# Patient Record
Sex: Female | Born: 1945 | Race: White | Hispanic: No | Marital: Married | Smoking: Former smoker
Health system: Southern US, Community
[De-identification: ages and names within clinical notes are randomized; demographics above are authoritative.]

## PROBLEM LIST (undated history)

## (undated) DIAGNOSIS — C50919 Malignant neoplasm of unspecified site of unspecified female breast: Secondary | ICD-10-CM

## (undated) DIAGNOSIS — E785 Hyperlipidemia, unspecified: Secondary | ICD-10-CM

## (undated) DIAGNOSIS — G4730 Sleep apnea, unspecified: Secondary | ICD-10-CM

## (undated) DIAGNOSIS — G5600 Carpal tunnel syndrome, unspecified upper limb: Secondary | ICD-10-CM

## (undated) DIAGNOSIS — E119 Type 2 diabetes mellitus without complications: Secondary | ICD-10-CM

## (undated) DIAGNOSIS — F32A Depression, unspecified: Secondary | ICD-10-CM

## (undated) DIAGNOSIS — F329 Major depressive disorder, single episode, unspecified: Secondary | ICD-10-CM

## (undated) DIAGNOSIS — J45909 Unspecified asthma, uncomplicated: Secondary | ICD-10-CM

## (undated) DIAGNOSIS — L409 Psoriasis, unspecified: Secondary | ICD-10-CM

## (undated) DIAGNOSIS — M810 Age-related osteoporosis without current pathological fracture: Secondary | ICD-10-CM

## (undated) DIAGNOSIS — E039 Hypothyroidism, unspecified: Secondary | ICD-10-CM

## (undated) DIAGNOSIS — I10 Essential (primary) hypertension: Secondary | ICD-10-CM

## (undated) DIAGNOSIS — E559 Vitamin D deficiency, unspecified: Secondary | ICD-10-CM

## (undated) DIAGNOSIS — J449 Chronic obstructive pulmonary disease, unspecified: Secondary | ICD-10-CM

## (undated) HISTORY — DX: Psoriasis, unspecified: L40.9

## (undated) HISTORY — DX: Age-related osteoporosis without current pathological fracture: M81.0

## (undated) HISTORY — DX: Chronic obstructive pulmonary disease, unspecified: J44.9

## (undated) HISTORY — DX: Hyperlipidemia, unspecified: E78.5

## (undated) HISTORY — PX: OTHER SURGICAL HISTORY: SHX169

## (undated) HISTORY — DX: Type 2 diabetes mellitus without complications: E11.9

## (undated) HISTORY — DX: Vitamin D deficiency, unspecified: E55.9

## (undated) HISTORY — DX: Sleep apnea, unspecified: G47.30

## (undated) HISTORY — DX: Carpal tunnel syndrome, unspecified upper limb: G56.00

## (undated) HISTORY — DX: Hypothyroidism, unspecified: E03.9

## (undated) HISTORY — DX: Essential (primary) hypertension: I10

## (undated) HISTORY — DX: Major depressive disorder, single episode, unspecified: F32.9

## (undated) HISTORY — DX: Depression, unspecified: F32.A

## (undated) HISTORY — PX: TUBAL LIGATION: SHX77

---

## 1993-11-13 HISTORY — PX: OTHER SURGICAL HISTORY: SHX169

## 1998-11-13 HISTORY — PX: THUMB ARTHROSCOPY: SHX2509

## 1999-10-25 ENCOUNTER — Ambulatory Visit (HOSPITAL_BASED_OUTPATIENT_CLINIC_OR_DEPARTMENT_OTHER): Admission: RE | Admit: 1999-10-25 | Discharge: 1999-10-25 | Payer: Self-pay | Admitting: Orthopedic Surgery

## 2000-10-01 ENCOUNTER — Other Ambulatory Visit: Admission: RE | Admit: 2000-10-01 | Discharge: 2000-10-01 | Payer: Self-pay | Admitting: Family Medicine

## 2003-01-19 ENCOUNTER — Other Ambulatory Visit: Admission: RE | Admit: 2003-01-19 | Discharge: 2003-01-19 | Payer: Self-pay | Admitting: Family Medicine

## 2004-01-21 ENCOUNTER — Other Ambulatory Visit: Admission: RE | Admit: 2004-01-21 | Discharge: 2004-01-21 | Payer: Self-pay | Admitting: Family Medicine

## 2006-01-03 ENCOUNTER — Ambulatory Visit: Payer: Self-pay | Admitting: Occupational Therapy

## 2006-04-02 ENCOUNTER — Encounter: Payer: Self-pay | Admitting: Nurse Practitioner

## 2006-04-12 ENCOUNTER — Ambulatory Visit: Payer: Self-pay | Admitting: Nurse Practitioner

## 2007-02-05 ENCOUNTER — Ambulatory Visit: Payer: Self-pay | Admitting: Nurse Practitioner

## 2007-02-26 ENCOUNTER — Ambulatory Visit: Payer: Self-pay | Admitting: Nurse Practitioner

## 2007-09-19 ENCOUNTER — Emergency Department: Payer: Self-pay | Admitting: Emergency Medicine

## 2008-07-21 ENCOUNTER — Ambulatory Visit: Payer: Self-pay | Admitting: Nurse Practitioner

## 2009-12-03 ENCOUNTER — Inpatient Hospital Stay: Payer: Self-pay | Admitting: Specialist

## 2009-12-15 ENCOUNTER — Inpatient Hospital Stay: Payer: Self-pay | Admitting: Specialist

## 2010-02-14 ENCOUNTER — Ambulatory Visit: Payer: Self-pay | Admitting: Internal Medicine

## 2010-03-01 ENCOUNTER — Ambulatory Visit: Payer: Self-pay | Admitting: Internal Medicine

## 2010-05-29 ENCOUNTER — Emergency Department: Payer: Self-pay | Admitting: Emergency Medicine

## 2010-07-12 ENCOUNTER — Ambulatory Visit: Payer: Self-pay | Admitting: Specialist

## 2011-04-12 ENCOUNTER — Ambulatory Visit: Payer: Self-pay | Admitting: Nurse Practitioner

## 2011-08-08 ENCOUNTER — Ambulatory Visit: Payer: Self-pay | Admitting: Gastroenterology

## 2011-08-10 LAB — PATHOLOGY REPORT

## 2012-04-19 ENCOUNTER — Ambulatory Visit: Payer: Self-pay | Admitting: Anesthesiology

## 2012-05-09 ENCOUNTER — Ambulatory Visit: Payer: Self-pay | Admitting: Anesthesiology

## 2012-05-28 ENCOUNTER — Ambulatory Visit: Payer: Self-pay | Admitting: Anesthesiology

## 2012-06-24 ENCOUNTER — Ambulatory Visit: Payer: Self-pay | Admitting: Anesthesiology

## 2012-07-23 ENCOUNTER — Ambulatory Visit: Payer: Self-pay | Admitting: Anesthesiology

## 2012-08-20 ENCOUNTER — Ambulatory Visit: Payer: Self-pay | Admitting: Anesthesiology

## 2012-09-19 ENCOUNTER — Ambulatory Visit: Payer: Self-pay | Admitting: Anesthesiology

## 2012-09-30 ENCOUNTER — Ambulatory Visit: Payer: Self-pay | Admitting: Anesthesiology

## 2012-11-25 LAB — COMPREHENSIVE METABOLIC PANEL
Albumin: 3.8 g/dL (ref 3.4–5.0)
BUN: 10 mg/dL (ref 7–18)
Bilirubin,Total: 0.3 mg/dL (ref 0.2–1.0)
Chloride: 101 mmol/L (ref 98–107)
Creatinine: 0.55 mg/dL — ABNORMAL LOW (ref 0.60–1.30)
EGFR (African American): >60
EGFR (Non-African Amer.): >60
Glucose: 131 mg/dL — ABNORMAL HIGH (ref 65–99)
Osmolality: 277 (ref 275–301)
Potassium: 3.8 mmol/L (ref 3.5–5.1)
SGOT(AST): 50 U/L — ABNORMAL HIGH (ref 15–37)
SGPT (ALT): 63 U/L (ref 12–78)
Total Protein: 7.9 g/dL (ref 6.4–8.2)

## 2012-11-25 LAB — TROPONIN I: Troponin-I: <0.02 ng/mL

## 2012-11-25 LAB — CBC
HCT: 41.6 % (ref 35.0–47.0)
HGB: 12.4 g/dL (ref 12.0–16.0)
MCH: 26.7 pg (ref 26.0–34.0)
MCHC: 29.9 g/dL — ABNORMAL LOW (ref 32.0–36.0)
MCV: 89 fL (ref 80–100)
Platelet: 328 10*3/uL (ref 150–440)
RBC: 4.66 10*6/uL (ref 3.80–5.20)
RDW: 15.7 % — ABNORMAL HIGH (ref 11.5–14.5)
WBC: 13.7 10*3/uL — ABNORMAL HIGH (ref 3.6–11.0)

## 2012-11-26 ENCOUNTER — Inpatient Hospital Stay: Payer: Self-pay | Admitting: Internal Medicine

## 2012-11-29 LAB — PLATELET COUNT: Platelet: 366 x10 3/mm 3

## 2012-11-30 LAB — CBC WITH DIFFERENTIAL/PLATELET
Basophil #: 0 10*3/uL (ref 0.0–0.1)
Basophil %: 0.1 %
Eosinophil #: 0.1 10*3/uL (ref 0.0–0.7)
HCT: 45.4 % (ref 35.0–47.0)
HGB: 14.9 g/dL (ref 12.0–16.0)
Lymphocyte #: 4.4 10*3/uL — ABNORMAL HIGH (ref 1.0–3.6)
MCH: 29.4 pg (ref 26.0–34.0)
MCHC: 32.7 g/dL (ref 32.0–36.0)
MCV: 90 fL (ref 80–100)
Monocyte %: 6.8 %
Neutrophil %: 67.8 %
Platelet: 389 10*3/uL (ref 150–440)

## 2012-11-30 LAB — BASIC METABOLIC PANEL
Anion Gap: 4 — ABNORMAL LOW (ref 7–16)
BUN: 23 mg/dL — ABNORMAL HIGH (ref 7–18)
Calcium, Total: 9.7 mg/dL (ref 8.5–10.1)
Chloride: 92 mmol/L — ABNORMAL LOW (ref 98–107)
Co2: 38 mmol/L — ABNORMAL HIGH (ref 21–32)
Creatinine: 0.66 mg/dL (ref 0.60–1.30)
EGFR (African American): >60
EGFR (Non-African Amer.): >60
Glucose: 91 mg/dL (ref 65–99)
Osmolality: 272 (ref 275–301)
Potassium: 4.7 mmol/L (ref 3.5–5.1)

## 2012-12-06 ENCOUNTER — Emergency Department: Payer: Self-pay | Admitting: Emergency Medicine

## 2012-12-06 LAB — BASIC METABOLIC PANEL
Co2: 34 mmol/L — ABNORMAL HIGH (ref 21–32)
Creatinine: 0.85 mg/dL (ref 0.60–1.30)
EGFR (African American): >60
EGFR (Non-African Amer.): >60
Potassium: 4.1 mmol/L (ref 3.5–5.1)

## 2012-12-06 LAB — CBC
HCT: 42.4 % (ref 35.0–47.0)
MCH: 29.1 pg (ref 26.0–34.0)
Platelet: 277 10*3/uL (ref 150–440)
RDW: 15.6 % — ABNORMAL HIGH (ref 11.5–14.5)

## 2012-12-06 LAB — URINALYSIS, COMPLETE
Bilirubin,UR: NEGATIVE
Blood: NEGATIVE
Ketone: NEGATIVE
Leukocyte Esterase: NEGATIVE
Nitrite: NEGATIVE
Ph: 5 (ref 4.5–8.0)
Protein: NEGATIVE
RBC,UR: 1 /HPF (ref 0–5)
Specific Gravity: 1.024 (ref 1.003–1.030)
Squamous Epithelial: 2
WBC UR: 1 /HPF (ref 0–5)

## 2013-06-02 ENCOUNTER — Inpatient Hospital Stay: Payer: Self-pay | Admitting: Surgery

## 2013-06-02 LAB — URINALYSIS, COMPLETE
Bacteria: NONE SEEN
Bilirubin,UR: NEGATIVE
Blood: NEGATIVE
Leukocyte Esterase: NEGATIVE
Ph: 6 (ref 4.5–8.0)
Protein: NEGATIVE
RBC,UR: 1 /HPF (ref 0–5)
Specific Gravity: 1.025 (ref 1.003–1.030)
WBC UR: 1 /HPF (ref 0–5)

## 2013-06-02 LAB — COMPREHENSIVE METABOLIC PANEL
Anion Gap: 3 — ABNORMAL LOW (ref 7–16)
BUN: 11 mg/dL (ref 7–18)
Bilirubin,Total: 0.4 mg/dL (ref 0.2–1.0)
Calcium, Total: 10.3 mg/dL — ABNORMAL HIGH (ref 8.5–10.1)
Creatinine: 0.77 mg/dL (ref 0.60–1.30)
EGFR (African American): >60
Osmolality: 284 (ref 275–301)
Potassium: 4.1 mmol/L (ref 3.5–5.1)
SGOT(AST): 38 U/L — ABNORMAL HIGH (ref 15–37)
SGPT (ALT): 49 U/L (ref 12–78)
Sodium: 137 mmol/L (ref 136–145)
Total Protein: 7.8 g/dL (ref 6.4–8.2)

## 2013-06-02 LAB — CBC
MCHC: 32.6 g/dL (ref 32.0–36.0)
MCV: 90 fL (ref 80–100)
RBC: 4.53 10*6/uL (ref 3.80–5.20)
RDW: 15.1 % — ABNORMAL HIGH (ref 11.5–14.5)
WBC: 13.8 10*3/uL — ABNORMAL HIGH (ref 3.6–11.0)

## 2013-06-02 LAB — LIPASE, BLOOD: Lipase: 141 U/L (ref 73–393)

## 2013-06-03 LAB — COMPREHENSIVE METABOLIC PANEL
Alkaline Phosphatase: 60 U/L (ref 50–136)
Anion Gap: 3 — ABNORMAL LOW (ref 7–16)
Bilirubin,Total: 0.3 mg/dL (ref 0.2–1.0)
EGFR (African American): >60
EGFR (Non-African Amer.): >60
Glucose: 196 mg/dL — ABNORMAL HIGH (ref 65–99)
Potassium: 3.5 mmol/L (ref 3.5–5.1)
SGOT(AST): 69 U/L — ABNORMAL HIGH (ref 15–37)
Sodium: 137 mmol/L (ref 136–145)
Total Protein: 6.9 g/dL (ref 6.4–8.2)

## 2013-06-03 LAB — MAGNESIUM: Magnesium: 1.4 mg/dL — ABNORMAL LOW

## 2013-06-03 LAB — FIBRINOGEN: Fibrinogen: 556 mg/dL — ABNORMAL HIGH (ref 210–470)

## 2013-06-03 LAB — CBC WITH DIFFERENTIAL/PLATELET
Basophil #: 0.1 10*3/uL (ref 0.0–0.1)
Eosinophil #: 0.2 10*3/uL (ref 0.0–0.7)
HCT: 36.9 % (ref 35.0–47.0)
HGB: 12 g/dL (ref 12.0–16.0)
Lymphocyte #: 2.5 10*3/uL (ref 1.0–3.6)
Lymphocyte %: 19.3 %
MCH: 29.3 pg (ref 26.0–34.0)
MCHC: 32.5 g/dL (ref 32.0–36.0)
MCV: 90 fL (ref 80–100)
Monocyte #: 0.9 x10 3/mm (ref 0.2–0.9)
Monocyte %: 7 %
Neutrophil #: 9.4 10*3/uL — ABNORMAL HIGH (ref 1.4–6.5)
Platelet: 268 10*3/uL (ref 150–440)
RBC: 4.09 10*6/uL (ref 3.80–5.20)
WBC: 13.2 10*3/uL — ABNORMAL HIGH (ref 3.6–11.0)

## 2013-06-03 LAB — PROTIME-INR: Prothrombin Time: 14.1 secs (ref 11.5–14.7)

## 2013-06-03 LAB — APTT: Activated PTT: 30 secs (ref 23.6–35.9)

## 2013-06-04 LAB — CBC WITH DIFFERENTIAL/PLATELET
Basophil #: 0 10*3/uL (ref 0.0–0.1)
Basophil %: 0.4 %
HCT: 35.6 % (ref 35.0–47.0)
HGB: 11.6 g/dL — ABNORMAL LOW (ref 12.0–16.0)
Lymphocyte #: 0.8 10*3/uL — ABNORMAL LOW (ref 1.0–3.6)
Lymphocyte %: 6.8 %
MCH: 29 pg (ref 26.0–34.0)
MCV: 90 fL (ref 80–100)
Monocyte %: 5.3 %
Neutrophil #: 10.6 10*3/uL — ABNORMAL HIGH (ref 1.4–6.5)
Neutrophil %: 86.2 %
Platelet: 242 10*3/uL (ref 150–440)
RBC: 3.98 10*6/uL (ref 3.80–5.20)
RDW: 14.7 % — ABNORMAL HIGH (ref 11.5–14.5)

## 2013-06-04 LAB — BASIC METABOLIC PANEL
Anion Gap: 5 — ABNORMAL LOW (ref 7–16)
Co2: 31 mmol/L (ref 21–32)
Creatinine: 0.66 mg/dL (ref 0.60–1.30)
EGFR (African American): >60
EGFR (Non-African Amer.): >60
Osmolality: 272 (ref 275–301)

## 2013-06-04 LAB — HEMOGLOBIN A1C: Hemoglobin A1C: 10.3 % — ABNORMAL HIGH (ref 4.2–6.3)

## 2013-06-05 LAB — CBC WITH DIFFERENTIAL/PLATELET
Basophil #: 0 10*3/uL (ref 0.0–0.1)
Basophil %: 0.2 %
Eosinophil #: 0.2 10*3/uL (ref 0.0–0.7)
HCT: 38 % (ref 35.0–47.0)
HGB: 12.8 g/dL (ref 12.0–16.0)
Lymphocyte #: 1.2 10*3/uL (ref 1.0–3.6)
Lymphocyte %: 8.9 %
MCH: 30 pg (ref 26.0–34.0)
MCHC: 33.6 g/dL (ref 32.0–36.0)
MCV: 89 fL (ref 80–100)
Neutrophil #: 11.3 10*3/uL — ABNORMAL HIGH (ref 1.4–6.5)
Neutrophil %: 82.8 %
RBC: 4.25 10*6/uL (ref 3.80–5.20)
RDW: 14.8 % — ABNORMAL HIGH (ref 11.5–14.5)
WBC: 13.6 10*3/uL — ABNORMAL HIGH (ref 3.6–11.0)

## 2013-09-25 ENCOUNTER — Ambulatory Visit: Payer: Self-pay | Admitting: Specialist

## 2014-04-01 ENCOUNTER — Ambulatory Visit: Payer: Self-pay | Admitting: Specialist

## 2014-06-25 DIAGNOSIS — J310 Chronic rhinitis: Secondary | ICD-10-CM | POA: Insufficient documentation

## 2014-06-25 DIAGNOSIS — J449 Chronic obstructive pulmonary disease, unspecified: Secondary | ICD-10-CM | POA: Insufficient documentation

## 2014-06-25 DIAGNOSIS — G4730 Sleep apnea, unspecified: Secondary | ICD-10-CM | POA: Insufficient documentation

## 2014-07-08 ENCOUNTER — Ambulatory Visit: Payer: Self-pay | Admitting: Specialist

## 2014-11-19 ENCOUNTER — Observation Stay: Payer: Self-pay | Admitting: Internal Medicine

## 2014-11-19 LAB — CBC
HCT: 47.5 % — ABNORMAL HIGH (ref 35.0–47.0)
HGB: 15 g/dL (ref 12.0–16.0)
MCH: 29.9 pg (ref 26.0–34.0)
MCHC: 31.5 g/dL — ABNORMAL LOW (ref 32.0–36.0)
MCV: 95 fL (ref 80–100)
Platelet: 297 10*3/uL (ref 150–440)
RBC: 5.01 10*6/uL (ref 3.80–5.20)
RDW: 14.1 % (ref 11.5–14.5)
WBC: 9.9 10*3/uL (ref 3.6–11.0)

## 2014-11-19 LAB — PRO B NATRIURETIC PEPTIDE: B-Type Natriuretic Peptide: 106 pg/mL (ref 0–125)

## 2014-11-19 LAB — BASIC METABOLIC PANEL
Anion Gap: 5 — ABNORMAL LOW (ref 7–16)
BUN: 7 mg/dL (ref 7–18)
CO2: 35 mmol/L — AB (ref 21–32)
CREATININE: 0.55 mg/dL — AB (ref 0.60–1.30)
Calcium, Total: 9 mg/dL (ref 8.5–10.1)
Chloride: 98 mmol/L (ref 98–107)
EGFR (African American): >60
Glucose: 226 mg/dL — ABNORMAL HIGH (ref 65–99)
OSMOLALITY: 281 (ref 275–301)
Potassium: 3.9 mmol/L (ref 3.5–5.1)
Sodium: 138 mmol/L (ref 136–145)

## 2014-11-19 LAB — TROPONIN I: Troponin-I: <0.02 ng/mL

## 2014-11-19 LAB — D-DIMER(ARMC): D-DIMER: 446 ng/mL

## 2014-11-20 LAB — CBC WITH DIFFERENTIAL/PLATELET
BASOS PCT: 0.1 %
Basophil #: 0 10*3/uL (ref 0.0–0.1)
Eosinophil #: 0 10*3/uL (ref 0.0–0.7)
Eosinophil %: 0 %
HCT: 45.2 % (ref 35.0–47.0)
HGB: 14.3 g/dL (ref 12.0–16.0)
LYMPHS ABS: 0.9 10*3/uL — AB (ref 1.0–3.6)
LYMPHS PCT: 8.4 %
MCH: 30 pg (ref 26.0–34.0)
MCHC: 31.8 g/dL — ABNORMAL LOW (ref 32.0–36.0)
MCV: 94 fL (ref 80–100)
Monocyte #: 0.2 x10 3/mm (ref 0.2–0.9)
Monocyte %: 1.6 %
NEUTROS ABS: 9.5 10*3/uL — AB (ref 1.4–6.5)
Neutrophil %: 89.9 %
Platelet: 297 10*3/uL (ref 150–440)
RBC: 4.79 10*6/uL (ref 3.80–5.20)
RDW: 13.9 % (ref 11.5–14.5)
WBC: 10.6 10*3/uL (ref 3.6–11.0)

## 2014-11-20 LAB — BASIC METABOLIC PANEL
Anion Gap: 6 — ABNORMAL LOW (ref 7–16)
BUN: 9 mg/dL (ref 7–18)
Calcium, Total: 9.1 mg/dL (ref 8.5–10.1)
Chloride: 97 mmol/L — ABNORMAL LOW (ref 98–107)
Co2: 32 mmol/L (ref 21–32)
Creatinine: 0.76 mg/dL (ref 0.60–1.30)
EGFR (African American): >60
EGFR (Non-African Amer.): >60
Glucose: 354 mg/dL — ABNORMAL HIGH (ref 65–99)
Osmolality: 283 (ref 275–301)
POTASSIUM: 4.4 mmol/L (ref 3.5–5.1)
SODIUM: 135 mmol/L — AB (ref 136–145)

## 2014-11-24 LAB — CULTURE, BLOOD (SINGLE)

## 2015-02-26 ENCOUNTER — Other Ambulatory Visit: Payer: Self-pay | Admitting: Specialist

## 2015-02-26 DIAGNOSIS — R911 Solitary pulmonary nodule: Secondary | ICD-10-CM

## 2015-03-05 NOTE — Consult Note (Signed)
PATIENT NAME:  Ebony [name], GIBBY MR#:  [id number] DATE OF BIRTH:  [birth year]  DATE OF CONSULTATION:  06/03/2013  REFERRING PHYSICIAN:  Molly Maduro, MD CONSULTING PHYSICIAN:  Briar Sink, MD  PRIMARY CARE PHYSICIAN: Erma Pinto, FNP-C  REASON FOR CONSULTATION:  Medical management of multiple comorbidities.  The patient is admitted with diverticulitis but also has uncontrolled diabetes, uncontrolled hypertension, COPD and other comorbidities.   HISTORY OF PRESENT ILLNESS: Ebony [name] is a very nice 69 year old female who has a history of diabetes, hypertension and COPD with chronic respiratory failure for what she uses 2 to 2.5 liters of oxygen at home. She also has hypothyroidism and hyperlipidemia. She is morbidly obese. She has psoriasis, osteoporosis and a fatty liver.  The patient comes with a history of abdominal pain located on the left lower quadrant that appeared the day before admission. The patient was admitted on 06/02/2013. The pain was mostly in the left lower quadrant pain with intensity of 6 or 7 out of 10, lasting for 2 days continuous, exacerbated with movement, improved with resting. It is not associated with nausea and vomiting. The patient has not had any diarrhea. The patient has been constipated for 3 to 4 days. The patient was evaluated by Dr. Leanora Cover who decided to admit her to the hospital after CT scan was done showing diverticulitis of the sigmoid colon. There was a recommendation to follow up with a colonoscopy due to similar appearance of cancer, although clinically the patient has diverticulitis. The patient was put on clindamycin and Rocephin. Her white count was elevated in the 1300s and she overall has been afebrile and she has not been tachycardic. She has chronic respiratory failure for what she uses 2 to 2.5 liters by nasal cannula. She has not had any exacerbations of this condition. The patient is having elevated blood sugars with low blood  pressures, which I was asking to help manage.   REVIEW OF SYSTEMS:  A 12 system review is done.  GENERAL: Negative. The patient denies any significant weight loss, weight gain, significant pain or difficulty getting around.  EYES:  Denies any blurry vision, double vision or eye swelling.  MOUTH:  No oral lesions. No odynophagia or dysphagia.  CARDIOVASCULAR: No chest pain. No orthopnea.  No palpitations. Never had a heart attack. No CHF.  RESPIRATORY: The patient has chronic respiratory failure, uses 2 to 2.5 liters of oxygen at home. She has not had any exacerbations. Denies any significant cough or increase in shortness of breath.  GASTROINTESTINAL: Positive for abdominal pain and constipation. No nausea or vomiting. No diarrhea. No hematemesis or melena.  GENITOURINARY: No significant dysuria, hematuria or changes in frequency.  GYNECOLOGIC: No breast masses. The patient has a history of BTL.  ENDOCRINE: No polyuria, polydipsia, polyphagia, cold or heat intolerance.  SKIN: Without any rashes, petechiae or new moles.  NEUROLOGIC: The patient denies any significant headaches, numbness, tingling, CVAs or TIAs. MUSCULOSKELETAL: No significant joint effusion or joint aches. The patient has mild knee and hip pain due to arthritis.  HEMATOLOGIC AND LYMPHATIC: No easy bruising or bleeding. No swollen glands.  PSYCHIATRIC: No significant anxiety or depression.  PAST MEDICAL HISTORY: 1.  Hypertension.  2.  Insulin-dependent diabetes.  3.  Chronic respiratory failure, on oxygen 2 liters at home.  4.  Severe chronic obstructive pulmonary disease.  5.  Hypothyroidism.  6.  Hyperlipidemia.  7.  Morbid obesity.  8.  Dyslipidemia.  9.  Depression and anxiety.  10.  Psoriasis.  11.  Osteoporosis.  12.  Fatty liver.   ALLERGIES: SHELLFISH.   PAST SURGICAL HISTORY:  Positive for BTL and carpal tunnel release syndrome.   FAMILY HISTORY: Positive for diabetes in her mom. Her mom also had CHF and  her brother had coronary artery disease. No cancer in the family.   SOCIAL HISTORY: The patient states that she smokes a little bit less than half pack a day. She quit before and she is going to try to quit again.  We had a long discussion about the benefits of quitting smoking and the patient states that she is going to give it her best try. Smoking cessation education has been given to the patient for at least 4 minutes. The patient does not drink. She is married.   CURRENT MEDICATIONS: 1.  Tylenol p.r.n. pain. 2.  Albuterol inhaler 2 puffs q. 4 hours as needed for shortness of breath. 3.  Aspirin 81 mg once a day. 4.  Symbicort 160/4.5 two puffs twice daily. 5.  Wellbutrin XL 150 mg daily. 6.  Ceftriaxone 1 gram every 24 hours. 7.  Citalopram 40 mg once daily. 8.  Diphenhydramine 25 mg every 6 hours as needed. 9.  Ergocalciferol 50,000 units once a week. 10.  Pepcid 20 mg twice daily. 11.  Furosemide 20 mg twice daily. 12.  Hydrochlorothiazide with losartan 12.5/50 mg take 2 tablets once a day. 13.  Insulin sliding scale.  14.  Lactulose 30 mg twice daily. 15.  Levothyroxine 0.25 mg once a day. 16.  Lidocaine ointment. 17.  Morphine 1 to 3 mg every 2 hours as needed for pain.  18.  Omega-3 fatty acids. 19.  Lovaza 1 gram once daily. 20.  Compazine suppository as needed for nausea. 21.  Simvastatin 40 mg at bedtime.  22.  Spiriva 1 capsule inhaled daily. 23.  Vitamin A 400 mg daily. 24.  Ambien 5 mg as needed for insomnia. 25.  Insulin detemir; the patient takes 70 units twice daily and she takes 32 units of NovoLog with breakfast, 32 at lunch and 38 at dinner time.  26.  Magnesium sulfate started today 2 grams x 1.   PHYSICAL EXAMINATION: VITAL SIGNS: Blood pressure 100/61, pulse 63, respirations 17, temperature 98.1, oxygen saturation 95% on 2 liters nasal cannula. GENERAL: The patient is alert and oriented x 3. No acute distress. No respiratory distress. Hemodynamically  stable. HEENT:  Pupils are equal and reactive. Extraocular movements are intact. Mucosa is moist. Anicteric sclerae. Pink conjunctivae. No oral lesions. No oropharyngeal exudates.  NECK: Supple. No JVD. No thyromegaly. No adenopathy. No carotid bruits. No rigidity.  HEART:  Regular rate and rhythm. No murmurs, rubs or gallops are appreciated. No tenderness to palpation on anterior chest wall. No displacement of PMI.  LUNGS: Clear. No wheezing or crepitus. There is significant decrease of respiratory sounds in both bases which seems to be chronic due to her body habitus. No use of accessory muscles.  ABDOMEN: Soft, nondistended. Bowel sounds are positive. Tender to palpation to the left lower quadrant. No rebound pain. No guarding.  GENITAL:  Negative for external lesions.  EXTREMITIES: No significant edema, cyanosis or clubbing. Pulses +2. Capillary refill less than 3.   MUSCULOSKELETAL: No significant joint effusions or erythema of joints.  No deformity.  SKIN: No rashes or petechiae.  NEUROLOGIC: Cranial nerves II through XII intact. Strength is 5/5 in all 4 extremities.  PSYCHIATRIC:  Negative for agitation. The patient is alert and oriented x 3 and  very pleasant.  LYMPHATIC: Negative for lymphadenopathy in the neck or supraclavicular areas.  VASCULAR: Pulses +2. Capillary refill less than 3.  NEUROLOGIC: Cranial nerves II through XII intact. Strength is 5/5 in all 4 extremities.   LABORATORY AND DIAGNOSTICS: Glucose 287, now 196. Creatinine 0.69. CO2 is 34. The patient is a chronic retainer. Magnesium is 1.4. Amylase is 20. AST 69. White blood count 13.2, hemoglobin 12 and platelet count 268.  Elevated fibrinogen at 556 with normal INR and prothrombin.  Urinalysis shows 1 white blood cell. No nitrites or leukocyte esterase.   CT scan:  As mentioned above.   ASSESSMENT AND PLAN:   1.  This is a 69 year old female admitted by Dr. Leanora Cover for treatment of diverticulitis. I was asked to help  with medical management.  2.  The patient has diverticulitis. Continue antibiotics for now. Pain control.  Nausea control. Manage blood sugars, blood pressures and any lung problems.  3.  The patient has been treated with antibiotics, clindamycin and Rocephin. This is not a common choice of antibiotics for this condition. Would prefer to simplify the treatment. Usually ceftriaxone and clindamycin together have potential to create a severe Clostridium difficile infection for what I would rather put the patient on ciprofloxacin and metronidazole if possible. 4.  I am going to wait until I talk to Dr. Rexene Edison who is covering for Dr. Leanora Cover about this issue.  5.  The patient is overall stable. Her white count is still the same as yesterday. She has not had any fever.  6.  As far as her diabetes goes, her hemoglobin A1c is above 9.  Rechecked just recently. We are going to ask to recheck a hemoglobin A1c again. We are going to start her back on some of her medications as she it is not necessarily eating all her regular diet. We are going to cut back the Levemir to half, instead of 70 units give 35, and we are also going to start her on insulin sliding scale. She takes NovoLog schedule, but we are going to hold on that.  7.  As far as her blood pressure, we are holding the Lasix and holding hydrochlorothiazide. Continue gentle hydration. Continue oral hydration.  8.  Hypomagnesemia. Replace. 9.  Obstructive sleep apnea. The patient is a chronic CO2 retainer. Her CO2 on the BNP is slightly elevated at 34. We are going to put her back on her CPAP with home settings.  10.  Deep vein thrombosis prophylaxis. Heparin.  11.  Gastrointestinal prophylaxis with Pepcid.   I spent about 50 minutes with this consultation. Thank you, Dr. Leanora Cover, for allowing me to participate in the care of your patient. Please call me if you have any questions.  ____________________________ [place] Sink,  MD rsg:sb D: 06/03/2013 12:34:48 ET T: 06/03/2013 13:05:52 ET JOB#: [id number]  cc: [place] Sink, MD, <Dictator> Taviana Westergren America Brown MD ELECTRONICALLY SIGNED 06/08/2013 0:33

## 2015-03-05 NOTE — Discharge Summary (Signed)
PATIENT NAME:  Ebony [name], ORREGO MR#:  [id number] DATE OF BIRTH:  [birth year]  DATE OF ADMISSION:  11/26/2012 DATE OF DISCHARGE:  12/01/2012  ADMITTING DIAGNOSIS:  Shortness of breath and cough.   DISCHARGE DIAGNOSES:   1.  Shortness of breath and cough due to severe acute on chronic obstructive pulmonary disease exacerbation as well as acute bronchitis. Was slow to improve and now improved.  2.  Diabetes with poor blood glucose control during hospitalization due to steroids, now blood glucose improved.  3.  Hypothyroidism.  4.  Hyperlipidemia.  5.  Depression/anxiety.  6.  Acute on chronic respiratory failure.  7.  Hypertension.  8.  Psoriasis.  9.  Carpal tunnel syndrome.  10.  Depression.  11.  Osteoporosis.  12.  Status post tubal ligation and surgeries for bone spurs.   CONSULTANTS:  [employer].   PERTINENT LABORATORY AND EVALUATIONS:  Chest x-ray showed no acute cardiopulmonary processes. No effusion. No consolidation. EKG was normal sinus rhythm without any ST-T wave changes, incomplete right bundle branch block. Blood glucose was 131. BNP was 79. BUN was 10, creatinine 0.5, sodium 138, potassium 3.8. LFTs were normal. Troponin was less than 0.02. CBC showed a WBC count 13,000, hemoglobin 12, platelet count 328. Influenza A and B were negative.   HOSPITAL COURSE:  Please refer to H and P done by the admitting physician. The patient is a 69 year old pleasant white female with COPD who is on home oxygen, diabetes, morbid obesity, who has not felt well before Christmas. She kept having wheezing, shortness of breath, coughing and did not improve at home. Therefore, she came to the hospital. Due to persistent symptoms, she was admitted for acute on chronic respiratory failure treated with nebulizers, steroids. She was slow to improve. She had to be continued on IV steroids and also placed on antibiotics. Finally, her cough and her symptoms have improved. She is currently stable for discharge.    DISCHARGE MEDICATIONS:  Metformin 500 mg 1 tab p.o. b.i.d., Celexa 40 mg daily, Lantus 60 units b.i.d., levothyroxine 200 mcg daily, NovoLog FlexPen 22 units subcutaneous once a day in the morning at breakfast, 22 units once a day at lunch and 38 units before supper, vitamin D2, 50,000 International Units 1 tab p.o. 2 times a week, albuterol MDI 2 puffs every 4 to 6 hours p.r.n. for wheezing, aspirin 81 mg 1 tab p.o. daily, Zocor 40 mg daily, Hyzaar 100/25 mg 1 tab p.o. daily, albuterol nebulizer 4 times per day as needed, Spiriva 18 mcg daily, Symbicort 2 puffs b.i.d., Derma-Smoothe scalp 0.01% topical oil apply to affected area b.i.d. as needed, Lasix 20 mg 1 tab p.o. b.i.d., Wellbutrin XL 150 mg daily, chlorpheniramine/hydrocodone 8/10 mg per 5 mL solution 5 mL q. 12 hours p.r.n. for cough, dextromethorphan and guaifenesin 5 mL q. 4 hours p.r.n., prednisone taper 60 mg decrease by 10 mg until complete, Levaquin 500 mg 1 tab p.o. daily for the next 4 days, home oxygen 2 liters nasal cannula.   DIET:  Low-sodium, low-fat, low-cholesterol, carbohydrate-controlled diet.   ACTIVITY:  As tolerated.   FOLLOWUP:  With primary care provider, Juliann Pulse L. Patterson, FNP-C, in 1 to 2 weeks.   TIME SPENT:  35 minutes.    ____________________________ Lafonda Mosses Posey Pronto, MD shp:si D: 12/01/2012 14:06:20 ET T: 12/01/2012 21:18:54 ET JOB#: [id number]  cc: Paislyn Domenico H. Posey Pronto, MD, <Dictator> Alric Seton MD ELECTRONICALLY SIGNED 12/04/2012 11:23

## 2015-03-05 NOTE — H&P (Signed)
PATIENT NAME:  Ebony [name], Ebony [name] MR#:  [id number] DATE OF BIRTH:  07-Dec-1945  DATE OF ADMISSION:  06/02/2013  HISTORY OF PRESENT ILLNESS: Ebony [name] is a 69 year old white female who began experiencing left lower quadrant abdominal pain yesterday. This has not been associated with nausea, vomiting, diarrhea or constipation. She has never experienced anything like this before. Typically, she has 1 to 2 bowel movements per day, but she has not had a bowel movement for 3 or 4 days. This is unusual for her. She also denies hematochezia and hematemesis and melena.   PAST MEDICAL HISTORY: 1.  Chronic obstructive pulmonary disease on chronic home oxygen therapy.  2.  History of severe COPD exacerbation January 2014.  3.  Diabetes mellitus.  4.  Hypothyroidism.  5.  Dyslipidemia.  6.  Morbid obesity.  7.  Depression/anxiety.  8.  Hypertension.  9.  Psoriasis.  10.  Osteoporosis.  11.  Fatty liver.  12.  Status post bilateral tubal ligation.   MEDICATIONS: Albuterol 3 mL inhaled b.i.d. p.r.n., albuterol CFC-free 90 mcg inhaler 2 puffs q. 4 to 6h. p.r.n, aspirin 81 mg daily, Celexa 40 mg daily, fish oil 1000 mg t.i.d., furosemide 20 mg b.i.d., Hyzaar 100/25 daily, Levemir 100 units subcutaneous as directed, levothyroxine 125 mcg, 2 in the morning, topical lidocaine to affected areas daily, metformin 500 mg, 2 b.i.d., NovoLog FlexPen as directed, Spiriva 18 mcg inhalation capsule daily, Symbicort 160 mcg/4.5 mcg 1 puff b.i.d., vitamin D2 50,000 units b.i.d., vitamin E 400 international units daily, Wellbutrin XL 150 mg Extended Release daily, Zocor 40 mg daily.   ALLERGIES: SHELLFISH.   REVIEW OF SYSTEMS: Negative for 10 systems except as mentioned in the history of present illness above. There is no acute change in the patient's respiratory status, and the remainder of the pertinent negatives are mentioned in the history of present illness.  SOCIAL HISTORY: The patient is married and unemployed. She  smoked 1/2 pack of cigarettes per day until 2011 when she quit. She does not drink any appreciable alcohol.   FAMILY HISTORY: Noncontributory.   PHYSICAL EXAMINATION: GENERAL: Reveals an elderly obese white female in no apparent distress. Height 5 feet 2 inches, weight 257 pounds, BMI 47.1.  VITAL SIGNS: 98.7, pulse 79, respirations 24, blood pressure 135/61, oxygen saturation 94% on 2.5 L/min nasal cannula at rest.  HEENT: Pupils are equally round and reactive to light. Extraocular movements are intact. Sclerae are anicteric. Oropharynx is clear. Mucous membranes are moist. Hearing intact to voice.  NECK: Supple with no tracheal deviation or jugular venous distention.  HEART: Regular rate and rhythm with no murmurs or rubs.  LUNGS: Clear to auscultation with normal respiratory effort.  ABDOMEN: Markedly obese with no tenderness on the right side, very mild tenderness on the left upper quadrant and more significant, but still mild, tenderness in the left lower quadrant. There is no rebound tenderness and no involuntary guarding.  EXTREMITIES: No edema, with normal capillary refill bilaterally.  NEUROLOGIC: Cranial nerves II through XII, motor and sensation grossly intact.  PSYCHIATRIC: Alert and oriented x 4. Appropriate affect.   OTHER STUDIES:  White blood cell count 13.8, hemoglobin 13, platelet count 295,000. Electrolytes normal. Glucose 287, calcium 10.3, lipase normal, hepatic profile normal.  CT scan of the abdomen and pelvis with contrast reveals diverticulosis of the sigmoid colon with mild bowel wall thickening and adjacent stranding of the sigmoid colon in the left lower quadrant. There are no extraluminal fluid collections and no extraluminal air.  ASSESSMENT: Mild acute sigmoid diverticulitis.   PLAN: Admit to the hospital for IV antibiotics, n.p.o. status initially with gradual advancement of diet to a low fiber diet. The patient was instructed that she will stay on a low fiber  diet for 6 weeks post hospitalization, and then convert to a high fiber diet to prevent future episodes.      I will consult the Internal Medicine service to manage her diabetes, COPD and all medical problems other than her antibiotics and diet.   ____________________________ Consuela Mimes, MD wfm:cb D: 06/02/2013 20:23:01 ET T: 06/02/2013 20:49:02 ET JOB#: [id number]  cc: Consuela Mimes, MD, <Dictator> Eather Colas. Sharlett Iles, FNP-C Consuela Mimes MD ELECTRONICALLY SIGNED 06/03/2013 20:22

## 2015-03-05 NOTE — Consult Note (Signed)
Brief Consult Note: Diagnosis: Diverticulitis/dm/htn/copd.   Patient was seen by consultant.   Consult note dictated.   Orders entered.   Comments: 69 yo female admitted with diverticulits. IV abx. CLD BG elevated adressing DM  meds. BP low holding BP meds.  Electronic Signatures: James Ivanoff, Roselie Awkward (MD)  (Signed 22-Jul-14 12:07)  Authored: Brief Consult Note   Last Updated: 22-Jul-14 12:07 by James Ivanoff, Roselie Awkward (MD)

## 2015-03-05 NOTE — H&P (Signed)
PATIENT NAME:  Ebony [name], Ebony [name] MR#:  [id number] DATE OF BIRTH:  [birth year]  DATE OF ADMISSION:  11/26/2012  PRIMARY CARE PHYSICIAN: Erma Pinto, NP Milus Glazier.  REFERRING PHYSICIAN: Dr. Lavonia Drafts.   CHIEF COMPLAINT: Increased shortness of breath and wheezing since Christmas.   HISTORY OF PRESENT ILLNESS: The patient is a 69 year old, pleasant, Caucasian female with history of chronic obstructive pulmonary disease, systemic hypertension, diabetes mellitus, and obesity. She also has history of asthma. The patient was doing well until just before Christmas, she developed symptoms of upper respiratory tract infection and primarily sinus congestion. However, with time, she feels that the symptoms started to descend down inside her trachea and upper chest area. This is happening over the last 1 week and gradually got short of breath, especially with walking. She has cough with whitish sputum, but no fever. No chills. No chest pain. When symptoms persisted, despite using inhalers and her steroids, she decided finally to come to the Emergency Department. Here, she received breathing treatment to no avail. Her chest x-ray showed no evidence of pneumonia. The patient was admitted to the hospital for treatment of acute exacerbation of chronic obstructive pulmonary disease.   REVIEW OF SYSTEMS:   CONSTITUTIONAL: Denies any fever. No chills. She has mild fatigue.  EYES: No blurring of vision. No double vision.  ENT: No hearing impairment. No sore throat. No dysphagia.  CARDIOVASCULAR: No chest pain, but reports shortness of breath. No edema. No syncope.  RESPIRATORY: Reports shortness of breath and wheezing. Cough with whitish sputum. No chest pain.  GASTROINTESTINAL: No abdominal pain, no vomiting, and no diarrhea.  GENITOURINARY: No dysuria. No frequency of urination.  MUSCULOSKELETAL: No joint pain or swelling. No muscular pain or swelling.  INTEGUMENTARY: No skin rash. No ulcers.  NEUROLOGY: No  focal weakness. No seizure activity. No headache.  PSYCHIATRY: No anxiety. No depression, but she has history of depression.  ENDOCRINE: No polyuria or polydipsia. No heat or cold intolerance.  HEMATOLOGY: No easy bruisability. No lymph node enlargement.   PAST MEDICAL HISTORY: Last admission to this hospital was in February 2011 with chronic obstructive pulmonary disease exacerbation. The patient also has diabetes mellitus type 2, systemic hypertension, hyperlipidemia, hypothyroidism, psoriasis, carpal tunnel syndrome, depression and osteoporosis.    PAST SURGICAL HISTORY: Carpal tunnel surgery, tubal ligation and a fourth operation for bone spur.   FAMILY HISTORY: Her mother died at the age of 66 from diabetic complications and congestive heart failure. She had a younger brother at age of 4. He had coronary artery disease and myocardial infarction.   SOCIAL HABITS: Ex-chronic smoker. She quit in 2011. She used to smoke 1/2 pack a day for 30 years. She does not abuse alcohol. No other drug abuse.   SOCIAL HISTORY: She is married, living with her husband. 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   ADMISSION MEDICATIONS: Include Lantus 60 units twice a day and additionally NovoLog  22 units in the morning, 22 at lunch and 32 a night, metformin 500 mg twice a day, Zocor 40 mg once a day, Wellbutrin XL 150 mg once a day, vitamin D2 50,000 units twice a week, Symbicort 2 puffs twice a day using strength of 160/4.5, Spiriva 1 inhalation once a day,  levothyroxine 200 mcg once a day, Hyzaar 125 mg once a day, furosemide 20 mg twice a day, Celexa 40 mg once a day, aspirin 81 mg a day and albuterol inhaler.   ALLERGIES: SHELLFISH.   PHYSICAL EXAMINATION:  VITAL  SIGNS: Blood pressure 125/62, respiratory rate 24, pulse 68, temperature 98.7, oxygen saturation 95%.  GENERAL APPEARANCE: This is an elderly female, obese, sitting on the bed in no acute distress.  HEAD AND NECK: No pallor. No icterus. No  cyanosis. Ears: Revealed normal hearing, no discharge, no lesions. Nasal mucosa was normal, no ulcers, no discharge. Lips and oral cavity was normal. No oral thrush. No ulcers. Eye examination revealed normal eyelids and conjunctiva. Pupils about 4 to 5 mm, round, equal and reactive to light.  NECK: Supple. Trachea at midline. No thyromegaly. No cervical lymphadenopathy. No masses. No hernias.  SKIN: No ulcers. No subcutaneous nodules.  MUSCULOSKELETAL: No joint swelling. No clubbing.  NEUROLOGIC: Cranial nerves II through XII are intact. No focal motor deficit.  PSYCHIATRY: The patient is alert and oriented x3. Mood and affect were normal.   LABORATORY FINDINGS: Chest x-ray showed no acute cardiopulmonary abnormality, no effusion, no consolidation. EKG showed normal sinus rhythm at rate of 73 per minute. Incomplete right bundle branch block, otherwise unremarkable EKG. Blood sugar of 131, subsequent blood sugar was 328. BNP was 79. BUN 10, creatinine 0.5, sodium 138, potassium 3.8. Normal liver function tests and liver transaminases except AST is slightly elevated at 50. Troponin less than 0.02. CBC showed white count of 13,000, hemoglobin 12, hematocrit 41, platelet count 328. Influenza A and B were negative.   ASSESSMENT:  1.  Progressive increase in shortness of breath secondary to acute exacerbation of chronic obstructive pulmonary disease.  2.  Systemic hypertension.  3.  Type 2 diabetes mellitus.  4.  Hypothyroidism.  5.  Hyperlipidemia.  6.  Obesity. 7.  Psoriasis.   PLAN: Will admit the patient to the medical floor. Intensify treatment with DuoNeb. Small dose of IV Solu-Medrol since she is diabetic and blood sugar is already now at 300 range. Will place the patient on Accu-Cheks and sliding scale. IV antibiotic using Zithromax. Continue home medications as listed above. I will continue Lantus at the same dose of 60 units twice a day; however, the NovoLog instead of t.i.d., I will make it  twice a day since I am limiting  her caloric intake; however, if the sugar remains elevated, then she may need to have additional dose of 32 units at night.   Time Spent in evaluating this patient and reviewing medical records took more than 55 minutes.   ____________________________ Clovis Pu. Lenore Manner, MD amd:aw D: 11/26/2012 01:04:00 ET T: 11/26/2012 06:51:20 ET JOB#: [id number]  cc: Clovis Pu. Lenore Manner, MD, <Dictator> Ellin Saba MD ELECTRONICALLY SIGNED 11/26/2012 7:24

## 2015-03-05 NOTE — Discharge Summary (Signed)
PATIENT NAME:  Ebony [name], Ebony [name] MR#:  [id number] DATE OF BIRTH:  [birth year]  DATE OF ADMISSION:  06/02/2013 DATE OF DISCHARGE:  06/05/2013  PRINCIPAL DIAGNOSIS: Mild acute sigmoid diverticulitis.   OTHER DIAGNOSES: 1.  Chronic obstructive pulmonary disease on chronic home oxygen therapy.  2.  History of severe chronic obstructive pulmonary disease exacerbation January 2014.  3.  Diabetes mellitus.  4.  Hypothyroidism.  5.  Dyslipidemia.  6.  Morbid obesity.  7.  Depression/anxiety.  8.  Hypertension.  9.  Psoriasis.  10.  Osteoporosis.  11.  Fatty liver.  12.  Status post bilateral tubal ligation.   PRINCIPAL PROCEDURES PERFORMED DURING THIS ADMISSION: None.   HOSPITAL COURSE: The patient was admitted to the hospital initially on a clear liquid diet and given lactulose and milk of magnesia and placed on IV antibiotics which were quickly switched to Cipro and Flagyl. She had a number of bowel movements and clinically improved such that she had no more tenderness on examination and only had rare twinges of left lower quadrant pain. Her white blood cell count at the time of discharge remained elevated at 13, but she remained afebrile. She was discharged home on all of her pre-hospital medications and in addition was put on a 10-day course of ciprofloxacin 500 mg q.12h and Flagyl 500 mg q. 8h.  She was not given any surgical followup.      She was instructed and educated by the dietitian on a low fiber diet, and she was advised to eat an extremely low fiber diet for the next 6 weeks and then to switch to a high fiber diet associated with 6 to 8 glasses of water per day for the rest of her life.   ____________________________ Consuela Mimes, MD wfm:cb D: 06/05/2013 20:38:53 ET T: 06/05/2013 23:03:02 ET JOB#: [id number]  cc: Consuela Mimes, MD, <Dictator> Eather Colas. Sharlett Iles, FNP-C Consuela Mimes MD ELECTRONICALLY SIGNED 06/13/2013 21:20

## 2015-03-14 NOTE — Discharge Summary (Signed)
PATIENT NAME:  Ebony [name], Ebony [name] MR#:  [id number] DATE OF BIRTH:  1946-07-29  DATE OF ADMISSION:  11/19/2014 DATE OF DISCHARGE:  11/21/2014  PRESENTING COMPLAINT: Shortness of breath and cough for 2 weeks.   PRIMARY CARE PHYSICIAN:  Dr. Erskine Emery.    PRIMARY PULMONOLOGIST Dr. Raul Del.   DISCHARGE DIAGNOSES: Chronic obstructive pulmonary disease exacerbation.   CODE STATUS: Full code.   MEDICATIONS:  1. Vitamin D2, 50,000 international units 1 capsule 2 times a week.  2. Albuterol in nebulizer 3 mL 2 times a day as needed.  3. Vitamin E 400 international units daily.  4. Aspirin 81 mg daily.  5. Symbicort 160/4.5 two puffs b.i.d.  6. Bupropion 150 mg extended release p.o. daily.  7. Citalopram 40 mg daily.  8. Fish oil 1000 mg p.o. daily.  9. Lasix 20 mg 2 tablets b.i.d.  10. Levothyroxine 200 mcg 1 tablet along with 50, so levothyroxine 250 mcg p.o. daily.  11. Hydrochlorothiazide/losartan 25/100 one tablet daily.  12. Metformin extended release 2 tablets b.i.d.  13. Simvastatin 40 mg p.o. at bedtime.  14. Spiriva 18 mcg inhalation daily.  15. Humira 40 mg 1 dose subcutaneous every 2 weeks.  16. Levemir 78 units once a day and 80 units at bedtime.  17. NovoLog 35 units b.i.d. at breakfast and supper and 48 units at lunch.  18. Ventolin HFA 2 puffs 4 times a day as needed.  19. Azithromycin 500 mg p.o. daily.  20. Prednisone taper.  21. Tussionex 5 mL b.i.d. as needed.   DISCHARGE INSTRUCTIONS:   1.  Home health PT.   2.  Continue home oxygen 3 liters per minute.  3.  Follow up with your primary care physician in 1-2 weeks.    HOSPITAL COURSE:  Ebony [name] is a 69 year old morbidly obese Caucasian female with multiple medical problems, well known to our service from previous admissions, comes in with:  1.  COPD exacerbation, acute on chronic. The patient was started on Solu-Medrol, nebulizers, antibiotics, and inhalers. She is now on her home oxygen which is 3 liters per  minute. Chest x-ray clear for pneumonia and no PE. No fever or leukocytosis. She will finish up the course for prednisone and antibiotic as outpatient.  2.  Generalized weakness. PT recommends home health PT which has been arranged.  3.  Type 2 diabetes. Sugars were a bit out of control secondary to steroids. She was continued on her high doses of insulin both long and short-acting.  4.  Continued tobacco abuse. The patient advised on counseling to stop.   Overall hospital stay otherwise remained stable. Discharge plan was discussed with the patient's husband. She remained a full code.   LABORATORY DATA AT DISCHARGE: H and H 14.3 and 45.2, white count is 10.6, platelet count is 297,000. Creatinine is 0.76, sodium is 135, chloride is 97, bicarbonate is 32. Blood cultures remained negative in 5 days. Chest x-ray did not show any acute disease.   TIME SPENT: 40 minutes.      ____________________________ Hart Rochester Posey Pronto, MD sap:bu D: 11/26/2014 10:38:19 ET T: 11/26/2014 15:37:36 ET JOB#: [id number]  cc: Aryanna Shaver A. Posey Pronto, MD, <Dictator> Ilda Basset MD ELECTRONICALLY SIGNED 11/27/2014 12:17

## 2015-03-14 NOTE — H&P (Signed)
PATIENT NAME:  Ebony [name], Ebony [name] MR#:  [id number] DATE OF BIRTH:  [birth year]  DATE OF ADMISSION:  11/19/2014  PRIMARY CARE PHYSICIAN:   Erskine Emery, MD   PULMONOLOGIST:  Raul Del, MD    CHIEF COMPLAINT: Shortness of breath and cough for 2 weeks.   HISTORY OF PRESENT ILLNESS: A 69 year old female patient with COPD oxygen dependent of 2.5 liters, at least 4 hours a day, comes in because of shortness of breath, for over 2 weeks associated with cough and green phlegm. The patient denies chest pain, no fever. Did not call Dr. Raul Del, comes in because of worsening of shortness of breath and needing oxygen all the time. The patient says that she has to use oxygen 2.5 to 3 liters all the time, since for 2 weeks. The patient's CT chest is negative for pulmonary emboli here. The patient received multiple nebulizer treatments without ( relief, admitted because of COPD  flare-up.   PAST MEDICAL HISTORY: Significant for history of hypertension, hyperlipidemia, diabetes, morbid obesity, depression, anxiety, psoriasis, osteoporosis, COPD with continued tobacco abuse.   ALLERGIES:  SHE IS ALLERGIC TO SHELLFISH.   SOCIAL HISTORY: Continues to smoke, used to smoke heavily over  2 packs per day for a long time, like 40 years; now she cut down to 5 to 10 cigarettes for a day, last smoked was about 4 days.  No alcohol. No drugs. Her activity is limited because of chronic obstructive pulmonary disease.   PAST SURGICAL HISTORY: Carpal tunnel surgery.   FAMILY HISTORY: Patient's mother has diabetes, and brother has diabetes.   MEDICATIONS:  1.  Albuterol nebulizer every 6 hours as needed.  2.  Aspirin 81 mg daily.  3.  Bupropion 150 mg p.o. daily.  4.  Celexa 40 mg daily.  5.  Fish oil 1 gram p.o. daily.  6.  lasix  20 mg 2 tablets  po b.i.d. 7.  Humira 40 mg subcutaneous every 2 weeks.  8.  9.  Levemir 78 units in the morning, 80 units at bedtime.  10.  Metformin 500 mg two tab  p.o. b.i.d. 11.  Levothyroxine  250 mcg p.o. daily.  12.  NovoLog with FlexPen 33 units b.i.d. with breakfast and supper, 48 units at lunch.  13.  Simvastatin 40 mg p.o. daily.  14.  Spiriva 18 mcg inhalation daily.   15.  Symbicort (160/45 > inhalation 2  pufss  b.i.d.  16.  Vitamin D 50,000 units once a week.   REVIEW OF SYSTEMS:  CONSTITUTIONAL: Feels short of breath and cough.  EYES: No blurred vision.  ENT: No tinnitus. No epistaxis. No difficulty swallowing.  RESPIRATORY:  cough and some shortness of breath for about 2 weeks.  CARDIOVASCULAR: No chest pain. No orthopnea. No PND.  GASTROINTESTINAL: No nausea. No vomiting. No abdominal pain.  GENITOURINARY: No dysuria or hematuria.  ENDOCRINE: No polyuria or nocturia; has diabetes present.    HEMATOLOGIC: No anemia or easy bruising.  INTEGUMENTARY: No skin rashes.  MUSCULOSKELETAL: positive for  joint pains .  NEUROLOGIC: No numbness or weakness.  PSYCHIATRIC: No anxiety or insomnia.   PHYSICAL EXAMINATION: VITAL SIGNS: Temperature 98 Fahrenheit, heart rate 69, blood pressure 120/70 , saturations 98% on 3 liters.  GENERAL: The patient is alert, awake, and oriented,well nourished  female not in distress, answering questions appropriately.  HEAD: Atraumatic, normocephalic.  EYES: Pupils equal, reacting to light. No conjunctival pallor, ( extraocular movements intact.  NOSE: No nasal lesions. No drainage noted.no turbinate hypertrophy,  EARS: No  drainage or external lesions.  MOUTH: No lesions or exudates.  NECK: Supple, symmetric, no masses, thyroid in the midline, normal range of motion.  RESPIRATORY: Bilateral breath sounds present but decreased overall in both sides; no wheezing, has diminished breath sounds on both sides.  CARDIOVASCULAR: S1, S2 regular. No murmurs. PMI not displaced. Peripheral pulses are equal in carotid and femoral, and dorsal pedis. No pedal edema.  ABDOMEN: Soft, nontender, nondistended, bowel sounds are normal,,no organo megaly,. no  hernias.  MUSCULOSKELETAL: Normal gait and station. No pathology of digits or nails.  EXTREMITIES: Move x 4, no tenderness or effusion.  SKIN: Inspection normal, well-hydrated.  LYMPHATIC: No lymphadenopathy in cervical, axillary region.  NEUROLOGIC: Alert, awake, oriented, cranial nerves II through XII intact; power 5/5 in upper and lower extremities, sensation intact, DTRs are 2+ bilaterally.  PSYCHIATRIC: Mood and affect are within normal limits.   DIAGNOSTIC DATA:   CT angio chest No evidence of pulmonary embolus. 2. Minimal peribronchovascular opacity at the right lower lobe, and minimal peripheral opacity in the anterior right upper lobe. Single tiny focus of airspace opacity at the left lung base. This may reflect a very mild atypical infectious process. Underlying mild interstitial prominence seen. 3. Scattered calcific atherosclerotic disease along the proximal great vessels, including at the origin of the left vertebral artery. 4. Mild nonspecific prominence of the left adrenal gland could reflect adrenal hyperplasia. WBC 9.9, hemoglobin 15, hematocrit 47.5, platelets 297,000; electrolytes, sodium 138, potassium 3.9, chloride 98, bicarbonate 35, BUN 7, creatinine 0.5 g lucose 226, troponin (less than 0.02.  EKG, normal sinus rhythm with no ST-T changes at 50 beats per minute.   ASSESSMENT AND PLAN: 1.  The patient is a 69 year old female patient with chronic obstructive pulmonary disease exacerbation and bilateral pneumonia. Admit her to hospitalist service. Continue Rocephin, Zithromax and IV Solu-Medrol and nebulizers. The patient probably can go home with p.o. antibiotics tomorrow.  2.  Diabetes mellitus.  Continue home medication.  3.  Hypertension. The patient is on medications for the blood pressure, resume that.  4.  History of depression. Continue on Celexa and bupropion.  5.  Continued tobacco abuse. The patient is counselled against  smoking for 5 minutes and offered  nicotine patch.   TIME SPENT: Was 55 minutes.    ____________________________ Epifanio Lesches, MD sk:nt D: 11/19/2014 20:57:27 ET T: 11/19/2014 21:37:04 ET JOB#: [id number]  cc: Epifanio Lesches, MD, <Dictator> Epifanio Lesches MD ELECTRONICALLY SIGNED 12/16/2014 17:39

## 2015-04-05 ENCOUNTER — Ambulatory Visit: Admission: RE | Admit: 2015-04-05 | Payer: Medicare PPO | Source: Ambulatory Visit

## 2015-07-20 ENCOUNTER — Other Ambulatory Visit: Payer: Self-pay | Admitting: Nurse Practitioner

## 2015-07-20 DIAGNOSIS — Z1239 Encounter for other screening for malignant neoplasm of breast: Secondary | ICD-10-CM

## 2015-07-29 ENCOUNTER — Ambulatory Visit: Payer: Medicare PPO

## 2015-08-05 ENCOUNTER — Other Ambulatory Visit: Payer: Self-pay | Admitting: Nurse Practitioner

## 2015-08-05 ENCOUNTER — Ambulatory Visit
Admission: RE | Admit: 2015-08-05 | Discharge: 2015-08-05 | Disposition: A | Discharge status: Home / Self Care | Payer: Medicare PPO | Source: Ambulatory Visit | Attending: Nurse Practitioner | Admitting: Nurse Practitioner

## 2015-08-05 DIAGNOSIS — Z1239 Encounter for other screening for malignant neoplasm of breast: Secondary | ICD-10-CM

## 2015-08-05 DIAGNOSIS — Z1231 Encounter for screening mammogram for malignant neoplasm of breast: Secondary | ICD-10-CM | POA: Diagnosis not present

## 2015-08-10 ENCOUNTER — Other Ambulatory Visit: Payer: Self-pay | Admitting: Nurse Practitioner

## 2015-08-10 DIAGNOSIS — N6489 Other specified disorders of breast: Secondary | ICD-10-CM

## 2015-08-17 ENCOUNTER — Ambulatory Visit
Admission: RE | Admit: 2015-08-17 | Discharge: 2015-08-17 | Disposition: A | Discharge status: Home / Self Care | Payer: Medicare PPO | Source: Ambulatory Visit | Attending: Nurse Practitioner | Admitting: Nurse Practitioner

## 2015-08-17 ENCOUNTER — Other Ambulatory Visit: Payer: Self-pay | Admitting: Nurse Practitioner

## 2015-08-17 DIAGNOSIS — N6489 Other specified disorders of breast: Secondary | ICD-10-CM | POA: Diagnosis present

## 2015-08-17 DIAGNOSIS — N6320 Unspecified lump in the left breast, unspecified quadrant: Secondary | ICD-10-CM

## 2015-08-25 ENCOUNTER — Ambulatory Visit
Admission: RE | Admit: 2015-08-25 | Discharge: 2015-08-25 | Disposition: A | Discharge status: Home / Self Care | Payer: Medicare PPO | Source: Ambulatory Visit | Attending: Nurse Practitioner | Admitting: Nurse Practitioner

## 2015-08-25 DIAGNOSIS — C50212 Malignant neoplasm of upper-inner quadrant of left female breast: Secondary | ICD-10-CM | POA: Diagnosis not present

## 2015-08-25 DIAGNOSIS — N6320 Unspecified lump in the left breast, unspecified quadrant: Secondary | ICD-10-CM

## 2015-08-25 DIAGNOSIS — N63 Unspecified lump in breast: Secondary | ICD-10-CM | POA: Diagnosis present

## 2015-08-30 LAB — SURGICAL PATHOLOGY

## 2015-09-07 ENCOUNTER — Inpatient Hospital Stay: Payer: Medicare PPO | Attending: Oncology | Admitting: Oncology

## 2015-09-07 ENCOUNTER — Encounter: Payer: Self-pay | Admitting: Oncology

## 2015-09-07 VITALS — BP 111/66 | HR 89 | Temp 95.6°F | Resp 24 | Ht 63.39 in | Wt 237.9 lb

## 2015-09-07 DIAGNOSIS — L409 Psoriasis, unspecified: Secondary | ICD-10-CM | POA: Diagnosis not present

## 2015-09-07 DIAGNOSIS — F329 Major depressive disorder, single episode, unspecified: Secondary | ICD-10-CM | POA: Diagnosis not present

## 2015-09-07 DIAGNOSIS — E119 Type 2 diabetes mellitus without complications: Secondary | ICD-10-CM | POA: Insufficient documentation

## 2015-09-07 DIAGNOSIS — G5600 Carpal tunnel syndrome, unspecified upper limb: Secondary | ICD-10-CM | POA: Diagnosis not present

## 2015-09-07 DIAGNOSIS — E785 Hyperlipidemia, unspecified: Secondary | ICD-10-CM | POA: Insufficient documentation

## 2015-09-07 DIAGNOSIS — I10 Essential (primary) hypertension: Secondary | ICD-10-CM | POA: Diagnosis not present

## 2015-09-07 DIAGNOSIS — F17210 Nicotine dependence, cigarettes, uncomplicated: Secondary | ICD-10-CM | POA: Diagnosis not present

## 2015-09-07 DIAGNOSIS — F419 Anxiety disorder, unspecified: Secondary | ICD-10-CM

## 2015-09-07 DIAGNOSIS — Z7982 Long term (current) use of aspirin: Secondary | ICD-10-CM | POA: Insufficient documentation

## 2015-09-07 DIAGNOSIS — Z170 Estrogen receptor positive status [ER+]: Secondary | ICD-10-CM | POA: Diagnosis not present

## 2015-09-07 DIAGNOSIS — J449 Chronic obstructive pulmonary disease, unspecified: Secondary | ICD-10-CM | POA: Insufficient documentation

## 2015-09-07 DIAGNOSIS — E559 Vitamin D deficiency, unspecified: Secondary | ICD-10-CM | POA: Insufficient documentation

## 2015-09-07 DIAGNOSIS — M818 Other osteoporosis without current pathological fracture: Secondary | ICD-10-CM | POA: Insufficient documentation

## 2015-09-07 DIAGNOSIS — G4730 Sleep apnea, unspecified: Secondary | ICD-10-CM | POA: Insufficient documentation

## 2015-09-07 DIAGNOSIS — Z23 Encounter for immunization: Secondary | ICD-10-CM | POA: Insufficient documentation

## 2015-09-07 DIAGNOSIS — E039 Hypothyroidism, unspecified: Secondary | ICD-10-CM | POA: Insufficient documentation

## 2015-09-07 DIAGNOSIS — Z79899 Other long term (current) drug therapy: Secondary | ICD-10-CM | POA: Diagnosis not present

## 2015-09-07 DIAGNOSIS — C50212 Malignant neoplasm of upper-inner quadrant of left female breast: Secondary | ICD-10-CM | POA: Diagnosis present

## 2015-09-07 MED ORDER — INFLUENZA VAC SPLIT QUAD 0.5 ML IM SUSY
0.5000 mL | PREFILLED_SYRINGE | Freq: Once | INTRAMUSCULAR | Status: AC
  Administered 2015-09-07: 0.5 mL via INTRAMUSCULAR
  Filled 2015-09-07: qty 0.5

## 2015-09-08 NOTE — Progress Notes (Signed)
  Oncology Nurse Navigator Documentation    Navigator Encounter Type: Initial MedOnc (09/08/15 0800) Patient Visit Type: Medonc;Initial (09/08/15 0800) Treatment Phase: Treatment (09/08/15 0800) Barriers/Navigation Needs: Family concerns (Husband has PTSD has service d; Patient lives in [place]) (09/08/15 0800)   Interventions: Coordination of Care;Education Method (09/08/15 0800)     Education Method: Written;Verbal;Teach-back (Breast Cancer Treatment Handbook/folder with hospital servic) (09/08/15 0800)      Time Spent with Patient: 45 (09/08/15 0800)    Met patient , and husband, and his service dog Kanu, at the initial Medical Oncology visit.  Scheduled surgical consult with Dr. Heath Lark at [facility] for 09/09/15 at 2:30 p.m. Will schedule follow-up with Dr. Grayland Ormond two weeks after surgery.  Patient to call with that date.

## 2015-09-09 ENCOUNTER — Encounter: Payer: Self-pay | Admitting: Surgery

## 2015-09-09 ENCOUNTER — Telehealth: Payer: Self-pay

## 2015-09-09 ENCOUNTER — Ambulatory Visit (INDEPENDENT_AMBULATORY_CARE_PROVIDER_SITE_OTHER): Payer: Medicare PPO | Admitting: Surgery

## 2015-09-09 VITALS — BP 111/69 | HR 75 | Temp 97.7°F | Ht 63.0 in | Wt 238.0 lb

## 2015-09-09 DIAGNOSIS — G4730 Sleep apnea, unspecified: Secondary | ICD-10-CM

## 2015-09-09 DIAGNOSIS — J449 Chronic obstructive pulmonary disease, unspecified: Secondary | ICD-10-CM

## 2015-09-09 DIAGNOSIS — C50912 Malignant neoplasm of unspecified site of left female breast: Secondary | ICD-10-CM | POA: Diagnosis not present

## 2015-09-09 NOTE — Progress Notes (Signed)
Subjective:     Patient ID: Ebony [name], female   DOB: 1946-08-14, 69 y.o.   MRN: [id number]  HPI  69 yr old female with left breast ca seen on biopsy.  Patient went in for normal mammogram, had been 2 yrs since previous and they found a 17m area on the left breast.  Patient did not feel a palpable mass.  She has not noticed any pain, changes to skin, changes to nipple or nipple discharge.  Biopsy revealed invasive ductal ER+, PR+, Her2 -.  Patient does not have any immediate realtives with breast CA or other types of cancer. She had two children starting at age 69 She did not breast feed.  She was on birthcontrol for 20 years.  She went through menopause at age 69 she has not taken any hormone therapy.    She does have COPD, uses a rescue inhaler about twice a week. She is limited by her breathing and can go about 237fbefore she has to rest.  She still smokes about 1/2 pack a day.  She does not get chest pain with exertion.    Past Medical History  Diagnosis Date  . Hyperlipidemia   . Hypertension   . Diabetes mellitus type [place], [place]complicated ([place]  . Hypothyroidism   . Psoriasis   . Depression   . Carpal tunnel syndrome   . COPD (chronic obstructive pulmonary disease) ([place]  . Osteoporosis, post-menopausal   . Vitamin D deficiency   . Sleep apnea    Past Surgical History  Procedure Laterality Date  . Knee surgery    . Tubal ligation    . Right heel spur removed     Family History  Problem Relation Age of Onset  . Diabetes Mother   . Thyroid disease Mother   . COPD Father   . Emphysema Father   . Heart disease Brother   . Diabetes Brother   . Heart disease Brother   . Thyroid disease Daughter   . Thyroid disease Son    Social History   Social History  . Marital Status: Married    Spouse Name: N/A  . Number of Children: N/A  . Years of Education: N/A   Social History Main Topics  . Smoking status: Current Every Day Smoker -- 1.50 packs/day for 30 years   Types: Cigarettes  . Smokeless tobacco: Never Used  . Alcohol Use: No  . Drug Use: No  . Sexual Activity: Not Asked   Other Topics Concern  . None   Social History Narrative    Current outpatient prescriptions:  .  Adalimumab (HUMIRA PEN-PSORIASIS STARTER) 40 MG/0.8ML PNKT, Inject into the skin., Disp: , Rfl:  .  albuterol (PROAIR HFA) 108 (90 BASE) MCG/ACT inhaler, INHALE 2 PUFFS BY MOUTH FOUR TIMES DAILY AS NEEDED, Disp: , Rfl:  .  albuterol (PROVENTIL) (2.5 MG/3ML) 0.083% nebulizer solution, Inhale 1 vial using nebulizer every six hours as needed, Disp: , Rfl:  .  aspirin EC 81 MG tablet, Take by mouth., Disp: , Rfl:  .  budesonide-formoterol (SYMBICORT) 160-4.5 MCG/ACT inhaler, INHALE 2 PUFFS BY MOUTH TWICE DAILY, Disp: , Rfl:  .  buPROPion (WELLBUTRIN XL) 150 MG 24 hr tablet, Take by mouth., Disp: , Rfl:  .  citalopram (CELEXA) 40 MG tablet, once daily., Disp: , Rfl:  .  furosemide (LASIX) 20 MG tablet, 2 (two) times daily., Disp: , Rfl:  .  glipiZIDE (GLUCOTROL) 5 MG tablet, Take 1 tablet by mouth  1 day or 1 dose., Disp: , Rfl:  .  insulin aspart (NOVOLOG) 100 UNIT/ML injection, 35-45 units subcutaneously twice a day, Disp: , Rfl:  .  insulin detemir (LEVEMIR) 100 UNIT/ML injection, Inject 55-68 units subcutaneously twice a day, Disp: , Rfl:  .  levothyroxine (SYNTHROID, LEVOTHROID) 200 MCG tablet, Take by mouth., Disp: , Rfl:  .  levothyroxine (SYNTHROID, LEVOTHROID) 50 MCG tablet, Take by mouth., Disp: , Rfl:  .  losartan-hydrochlorothiazide (HYZAAR) 100-25 MG tablet, Take by mouth., Disp: , Rfl:  .  metFORMIN (GLUCOPHAGE-XR) 500 MG 24 hr tablet, Take by mouth., Disp: , Rfl:  .  simvastatin (ZOCOR) 40 MG tablet, Take by mouth., Disp: , Rfl:  .  tiotropium (SPIRIVA HANDIHALER) 18 MCG inhalation capsule, INHALE 1 CAPSULE AS DIRECTED ONCE DAILY., Disp: , Rfl:  .  vitamin E 400 UNIT capsule, Take by mouth., Disp: , Rfl:  Allergies  Allergen Reactions  . Other Hives and Rash     Shellfish. Shellfish       Review of Systems  Constitutional: Negative for fever, chills, activity change, appetite change and fatigue.  HENT: Negative for congestion and sore throat.   Respiratory: Positive for cough, shortness of breath and wheezing. Negative for choking, chest tightness and stridor.   Cardiovascular: Negative for chest pain, palpitations and leg swelling.  Gastrointestinal: Negative for nausea, vomiting, abdominal pain, diarrhea, constipation, blood in stool, abdominal distention and anal bleeding.  Genitourinary: Negative for dysuria and hematuria.  Musculoskeletal: Positive for back pain, joint swelling and gait problem.  Skin: Positive for color change and rash.  Neurological: Negative for dizziness, tremors and weakness.  Hematological: Negative for adenopathy. Does not bruise/bleed easily.  Psychiatric/Behavioral: Negative for agitation. The patient is nervous/anxious.   All other systems reviewed and are negative.  Filed Vitals:   09/09/15 1452  BP: 111/69  Pulse: 75  Temp: 97.7 F (36.5 C)       Objective:   Physical Exam  Constitutional: She is oriented to person, place, and time. She appears well-developed and well-nourished. No distress.  HENT:  Head: Normocephalic and atraumatic.  Right Ear: External ear normal.  Left Ear: External ear normal.  Nose: Nose normal.  Mouth/Throat: Oropharynx is clear and moist.  Eyes: Conjunctivae are normal. Pupils are equal, round, and reactive to light. No scleral icterus.  Neck: Normal range of motion. Neck supple. No tracheal deviation present.  Cardiovascular: Normal rate, regular rhythm, normal heart sounds and intact distal pulses.  Exam reveals no gallop and no friction rub.   No murmur heard. Pulmonary/Chest: No respiratory distress. She has wheezes. She has no rales.  Some effort with breathing  Abdominal: Soft. Bowel sounds are normal. She exhibits no distension. There is no tenderness. There is no  rebound.  Musculoskeletal: Normal range of motion. She exhibits no edema or tenderness.  Neurological: She is alert and oriented to person, place, and time. No cranial nerve deficit.  Skin: Skin is warm and dry. Rash noted. No erythema.  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 Her behavior is normal. Judgment and thought content normal.  Vitals reviewed. Breast Exam:  Right breast: normal breast tissue without any masses, no nipple discharge or skin retraction, no cervical supraclavicular or axillary lymphadenopathy Left breast: no palpable mass, small bruise from biopsy site at 12'oclock position, no other skin changes or retraction, no cervical, supraclavicular or axillary lymphadenopathy  Assessment:     69 yr old female with left breast CA    Plan:     I discussed the available options with the patient and her husband.  The risk of recurrence is similar between mastectomy and lumpectomy with radiation.  I also discussed that given the small size of the cancer would recommend a needle localization lumpectomy with radiation to follow.  I also discussed that we would need to do a sentinel lymph node biopsy to check the nodes.  Explained to the patient that after her surgical treatment she would also need an aromatase inhibitor since her tumor was ER/PR +.    I also discussed that given her lung disease she is higher risk for surgery.  I discussed risks of bleeding, infection, damage to surrounding tissues, having positive margins, needing further resection, damage to nerves causing arm numbness or difficulty raising arm, causing lymphoedema in the arm; as well as anesthesia risks of MI, stroke, prolonged ventilation, pulmonary embolism, thrombosis and even death.   Patient was given the opportunity to ask questions and have them answered.  They would like to proceed with Left breast needle  localized lumpectomy with sentinel lymph node biopsy.   I will have her see the pulmonologist and pre-anesthesia clinic; will schedule her ASAP after these appointments.

## 2015-09-09 NOTE — Telephone Encounter (Signed)
Called [facility] Dr. Wallene Huh [phone]) and asked for patient to be seen for medical clearance.  I was told that they will be contacting the patient to schedule her appointment by the beginning of next week.  I will call patient tomorrow to make sure that her appointment was scheduled.

## 2015-09-09 NOTE — Patient Instructions (Signed)
I will contact Dr. Melody Haver Fleming's office to schedule your appointment to get medical clearance. After we obtain medical clearance from Dr. Raul Del, we will call you to schedule your surgery with Dr. Azalee Course.

## 2015-09-09 NOTE — Telephone Encounter (Signed)
Once we obtain medical clearance from Dr. Wallene Huh (pulmonologist), patient will need an office pre-admit appointment and to be seen by the anesthesiologist too.

## 2015-09-10 NOTE — Telephone Encounter (Signed)
I called Dr. Gust Brooms (Pulmonologist) office to ask when patient's appointment would be and I was told that it was for 09/14/2015 at 1:00 PM. However, when I called patient to confirm her appointment she told me that she will put surgery on hold until the beginning of next year because of her insurance changing. She wants to make sure that her new insurance will pay for her surgery and treatments. She wanted Dr. Azalee Course to know what she had decided. So I e-mailed Dr. Azalee Course per patients request.  I then called Dr. Gust Brooms office and cancelled her appointment per patient's request.

## 2015-09-16 ENCOUNTER — Telehealth: Payer: Self-pay

## 2015-09-16 NOTE — Telephone Encounter (Signed)
Per Dr. Azalee Course, patient had questions about insurance coverage for upcoming treatment plan.  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  Patient had cancelled appointments necessary for surgery to be scheduled.  Spoke to patient ,and explained billing would be attached to each visit, so overlapping should not occur, and will give her name of financial counselor if needed. She is ready to proceed with surgery.  Notified Dr. Geoffry Paradise office.    Oncology Nurse Navigator Documentation    Navigator Encounter Type: Telephone (09/16/15 1600) Patient Visit Type: Follow-up (09/16/15 1600) Treatment Phase:  (Surgery) (09/16/15 1600) Barriers/Navigation Needs: Financial (09/16/15 1600)   Interventions: Coordination of Care;Referrals (09/16/15 1600) Referrals: financial counselling (09/16/15 1600)   Education Method: Verbal (09/16/15 1600)      Time Spent with Patient: 45 (09/16/15 1600)

## 2015-09-17 NOTE — Progress Notes (Signed)
[facility]  [phone] [phone]  ID: Ebony [name] OB: 09-03-1946  MR#: [id number]  MWN#:[id number]  Patient Care Team: Renee Rival, NP as PCP - General (Nurse Practitioner)  CHIEF COMPLAINT:  Chief Complaint  Patient presents with  . New Evaluation    breast cancer    INTERVAL HISTORY: Patient is a 69 year old female who underwent routine screening mammogramfound to be abnormal. Subsequent biopsy of her left breast revealed an invasive carcinoma. Currently, she is highly anxious otherwise feels well. She has no neurologic complaints. She denies any pain. She is good appetite and denies weight loss. She has no chest pain or shortness of breath. She denies any nausea, vomiting, constipation, or diarrhea. She has no urinary complaints. Patient feels at her baseline and offers no further specific complaints.  REVIEW OF SYSTEMS:   Review of Systems  Constitutional: Negative.  Negative for fever and malaise/fatigue.  Respiratory: Negative.   Cardiovascular: Negative.   Gastrointestinal: Negative.   Musculoskeletal: Negative.   Neurological: Negative.  Negative for weakness.    As per HPI. Otherwise, a complete review of systems is negatve.  PAST MEDICAL HISTORY: Past Medical History  Diagnosis Date  . Hyperlipidemia   . Hypertension   . Diabetes mellitus type 2, uncomplicated ([place])   . Hypothyroidism   . Psoriasis   . Depression   . Carpal tunnel syndrome   . COPD (chronic obstructive pulmonary disease) ([place])   . Osteoporosis, post-menopausal   . Vitamin D deficiency   . Sleep apnea     PAST SURGICAL HISTORY: Past Surgical History  Procedure Laterality Date  . Knee surgery    . Tubal ligation    . Right heel spur removed      FAMILY HISTORY Family History  Problem Relation Age of Onset  . Diabetes Mother   . Thyroid disease Mother   . COPD Father   . Emphysema Father   . Heart disease Brother   . Diabetes  Brother   . Heart disease Brother   . Thyroid disease Daughter   . Thyroid disease Son        ADVANCED DIRECTIVES:    HEALTH MAINTENANCE: Social History  Substance Use Topics  . Smoking status: Current Every Day Smoker -- 1.50 packs/day for 30 years    Types: Cigarettes  . Smokeless tobacco: Never Used  . Alcohol Use: No     Colonoscopy:  PAP:  Bone density:  Lipid panel:  Allergies  Allergen Reactions  . Other Hives and Rash    Shellfish. Shellfish    Current Outpatient Prescriptions  Medication Sig Dispense Refill  . Adalimumab (HUMIRA PEN-PSORIASIS STARTER) 40 MG/0.8ML PNKT Inject into the skin.    Marland Kitchen albuterol (PROAIR HFA) 108 (90 BASE) MCG/ACT inhaler INHALE 2 PUFFS BY MOUTH FOUR TIMES DAILY AS NEEDED    . albuterol (PROVENTIL) (2.5 MG/3ML) 0.083% nebulizer solution Inhale 1 vial using nebulizer every six hours as needed    . aspirin EC 81 MG tablet Take by mouth.    . budesonide-formoterol (SYMBICORT) 160-4.5 MCG/ACT inhaler INHALE 2 PUFFS BY MOUTH TWICE DAILY    . buPROPion (WELLBUTRIN XL) 150 MG 24 hr tablet Take by mouth.    . citalopram (CELEXA) 40 MG tablet once daily.    . furosemide (LASIX) 20 MG tablet 2 (two) times daily.    . insulin aspart (NOVOLOG) 100 UNIT/ML injection 35-45 units subcutaneously twice a day    . insulin detemir (LEVEMIR) 100 UNIT/ML injection  Inject 55-68 units subcutaneously twice a day    . levothyroxine (SYNTHROID, LEVOTHROID) 200 MCG tablet Take by mouth.    . levothyroxine (SYNTHROID, LEVOTHROID) 50 MCG tablet Take by mouth.    . losartan-hydrochlorothiazide (HYZAAR) 100-25 MG tablet Take by mouth.    . metFORMIN (GLUCOPHAGE-XR) 500 MG 24 hr tablet Take by mouth.    . simvastatin (ZOCOR) 40 MG tablet Take by mouth.    . tiotropium (SPIRIVA HANDIHALER) 18 MCG inhalation capsule INHALE 1 CAPSULE AS DIRECTED ONCE DAILY.    . vitamin E 400 UNIT capsule Take by mouth.    Marland Kitchen glipiZIDE (GLUCOTROL) 5 MG tablet Take 1 tablet by mouth 1  day or 1 dose.     No current facility-administered medications for this visit.    OBJECTIVE: Filed Vitals:   09/07/15 1112  BP: 111/66  Pulse: 89  Temp: 95.6 F (35.3 C)  Resp: 24     Body mass index is 41.63 kg/(m^2).    ECOG FS:0 - Asymptomatic  General: Well-developed, well-nourished, no acute distress. Eyes: Pink conjunctiva, anicteric sclera. HEENT: Normocephalic, moist mucous membranes, clear oropharnyx. Breasts: Patient requested exam be deferred today. Lungs: Clear to auscultation bilaterally. Heart: Regular rate and rhythm. No rubs, murmurs, or gallops. Abdomen: Soft, nontender, nondistended. No organomegaly noted, normoactive bowel sounds. Musculoskeletal: No edema, cyanosis, or clubbing. Neuro: Alert, answering all questions appropriately. Cranial nerves grossly intact. Skin: No rashes or petechiae noted. Psych: Normal affect. Lymphatics: No cervical, calvicular, axillary or inguinal LAD.   LAB RESULTS:  Lab Results  Component Value Date   NA 135* 11/20/2014   K 4.4 11/20/2014   CL 97* 11/20/2014   CO2 32 11/20/2014   GLUCOSE 354* 11/20/2014   BUN 9 11/20/2014   CREATININE 0.76 11/20/2014   CALCIUM 9.1 11/20/2014   PROT 6.9 06/03/2013   ALBUMIN 3.3* 06/03/2013   AST 69* 06/03/2013   ALT 50 06/03/2013   ALKPHOS 60 06/03/2013   BILITOT 0.3 06/03/2013   GFRNONAA >60 11/20/2014   GFRAA >60 11/20/2014    Lab Results  Component Value Date   WBC 10.6 11/20/2014   NEUTROABS 9.5* 11/20/2014   HGB 14.3 11/20/2014   HCT 45.2 11/20/2014   MCV 94 11/20/2014   PLT 297 11/20/2014     STUDIES: Mm Digital Diagnostic Unilat L  08/31/2015  ADDENDUM REPORT: 08/31/2015 11:24 ADDENDUM: Pathology results: Pathology results from the ultrasound-guided biopsy of the mass in the left breast at 11 o'clock revealed grade 2 invasive mammary carcinoma. This is concordant with the imaging findings. The patient has been notified of the results. She is doing well and denies  any biopsy site complications. The results were communicated to [facility] in [place], NP's office on 08/26/2015 who will be referring the patient for further care. Read back procedure was performed. [name] has been instructed to follow-up with her provider's office for referral to a breast surgeon. The patient has been instructed to call the [facility] with any questions or concerns. Electronically Signed   By: Everlean Alstrom M.D.   On: 08/31/2015 11:24  08/31/2015  CLINICAL DATA:  Post biopsy mammogram of the left breast for clip placement. EXAM: DIAGNOSTIC LEFT MAMMOGRAM POST ULTRASOUND BIOPSY COMPARISON:  Previous exam(s). FINDINGS: Mammographic images were obtained following ultrasound guided biopsy of a left breast mass at 11 o'clock. The coil shaped biopsy marking clip is appropriately positioned at 11 o'clock in the left breast at the intended site of biopsy. IMPRESSION: Appropriate positioning of  the coil shaped biopsy marking clip in the left breast at the intended site of biopsy. Final Assessment: Post Procedure Mammograms for Marker Placement Electronically Signed: By: Ammie Ferrier M.D. On: 08/25/2015 13:41   [place] Lt Breast Bx W Loc Dev 1st Lesion Img Bx Spec US Guide  08/25/2015  CLINICAL DATA:  69 year old female presenting for ultrasound-guided biopsy of a left breast mass. EXAM: ULTRASOUND GUIDED LEFT BREAST CORE NEEDLE BIOPSY COMPARISON:  Previous exam(s). PROCEDURE: I met with the patient and we discussed the procedure of ultrasound-guided biopsy, including benefits and alternatives. We discussed the high likelihood of a successful procedure. We discussed the risks of the procedure including infection, bleeding, tissue injury, clip migration, and inadequate sampling. Informed written consent was given. The usual time-out protocol was performed immediately prior to the procedure. Using sterile technique and 2% Lidocaine as local anesthetic, under direct ultrasound  visualization, a 12 gauge vacuum-assisted device was used to perform biopsy of a small mass in the left breast at 11 o'clockusing a lateral approach. At the conclusion of the procedure, a coil shaped tissue marker clip was deployed into the biopsy cavity. Follow-up 2-view mammogram was performed and dictated separately. IMPRESSION: Ultrasound-guided biopsy of small mass in the left breast at 11 o'clock. No apparent complications. Electronically Signed   By: Ammie Ferrier M.D.   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  PLAN:    1. Breast cancer: Given the size of patient's primary tumor on mammogram, she is unlikely she will require adjuvant chemotherapy but will send for Oncotype DX to ensure she is not high risk. Patient will require lumpectomy followed by adjuvant XRT. This would then be followed by 5 years of an aromatase inhibitor. A referral was made to surgery for further evaluation.  Patient will then return to clinic in 1-2 weeks after her surgery to discuss her final pathology results and further treatment planning. 2. Psoriasis: Patient currently takes Humira under the direction of Dr. Evorn Gong.  Approximately 45 minutes was spent in discussion and consultation.  Patient expressed understanding and was in agreement with this plan. She also understands that She can call clinic at any time with any questions, concerns, or complaints.    Lloyd Huger, MD   09/17/2015 12:45 PM

## 2015-09-20 ENCOUNTER — Telehealth: Payer: Self-pay

## 2015-09-20 NOTE — Telephone Encounter (Signed)
Called patient and told her that her appointment with Dr. Raul Del is on 09/22/2015 at 2:45 PM. Patient agreed on going.

## 2015-09-27 ENCOUNTER — Telehealth: Payer: Self-pay

## 2015-09-27 NOTE — Telephone Encounter (Signed)
Patient phoned to say seh saw Dr. Raul Del the pulmonologist on Friday, and has been cleared for surgery.  She wanted to know what the next step ould be.  Notified Museum/gallery conservator at [facility].  They are working with scheduling to schedule lumpectomy with Sentinel node biopsy, and will call patient with appointment.  Notified patient.  Oncology Nurse Navigator Documentation    Navigator Encounter Type: Telephone (09/27/15 1100) Patient Visit Type: Follow-up (09/27/15 1100)       Interventions: Coordination of Care (09/27/15 1100)     Education Method: Verbal (09/27/15 1100)      Time Spent with Patient: 30 (09/27/15 1100)

## 2015-09-27 NOTE — Telephone Encounter (Signed)
Clearance obtained from Dr. Raul Del. Please notify patient as soon as surgery, pre-op, and sentinel node has been confirmed.

## 2015-09-28 ENCOUNTER — Other Ambulatory Visit: Payer: Self-pay | Admitting: Surgery

## 2015-09-28 DIAGNOSIS — C50912 Malignant neoplasm of unspecified site of left female breast: Secondary | ICD-10-CM

## 2015-09-28 NOTE — Telephone Encounter (Signed)
Call made to Al Pimple (Patient's Nurse Navigator) at this time to make her aware of upcoming plan.

## 2015-09-28 NOTE — Telephone Encounter (Signed)
Patient has been informed of her pre admission date and time.  10/01/15 @ 10:30am--office.

## 2015-09-28 NOTE — Telephone Encounter (Signed)
Pt advised of pre op date/time and sx date. Sx: 10/06/15 with Dr Carole Binning lumpectomy-Needle loc--sentinel lymph node injection and biopsy. Pre op: Pending pre op time. Patient was informed that I would call her back before the end of the day with this information.   Patient was advised to arrive at [facility] on 10/06/15 @ 8:15 am (the day of surgery). She was informed that after needle localization and sentinel lymph node injection has been preformed that she will then be sent to Same Day Surgery.   Patient understood all information.

## 2015-10-01 ENCOUNTER — Telehealth: Payer: Self-pay

## 2015-10-01 ENCOUNTER — Encounter
Admission: RE | Admit: 2015-10-01 | Discharge: 2015-10-01 | Disposition: A | Discharge status: Home / Self Care | Payer: Medicare PPO | Source: Ambulatory Visit | Attending: Surgery | Admitting: Surgery

## 2015-10-01 ENCOUNTER — Ambulatory Visit
Admission: RE | Admit: 2015-10-01 | Discharge: 2015-10-01 | Disposition: A | Discharge status: Home / Self Care | Payer: Medicare PPO | Source: Ambulatory Visit | Attending: Surgery | Admitting: Surgery

## 2015-10-01 ENCOUNTER — Other Ambulatory Visit: Payer: Self-pay

## 2015-10-01 DIAGNOSIS — Z01818 Encounter for other preprocedural examination: Secondary | ICD-10-CM | POA: Insufficient documentation

## 2015-10-01 DIAGNOSIS — J449 Chronic obstructive pulmonary disease, unspecified: Secondary | ICD-10-CM | POA: Diagnosis not present

## 2015-10-01 DIAGNOSIS — J45909 Unspecified asthma, uncomplicated: Secondary | ICD-10-CM | POA: Insufficient documentation

## 2015-10-01 DIAGNOSIS — C50912 Malignant neoplasm of unspecified site of left female breast: Secondary | ICD-10-CM

## 2015-10-01 DIAGNOSIS — Z01810 Encounter for preprocedural cardiovascular examination: Secondary | ICD-10-CM | POA: Diagnosis present

## 2015-10-01 LAB — CBC WITH DIFFERENTIAL/PLATELET
BASOS ABS: 0.1 10*3/uL (ref 0–0.1)
BASOS PCT: 1 %
EOS ABS: 0.1 10*3/uL (ref 0–0.7)
EOS PCT: 1 %
HCT: 44.6 % (ref 35.0–47.0)
HEMOGLOBIN: 14.5 g/dL (ref 12.0–16.0)
Lymphocytes Relative: 23 %
Lymphs Abs: 2.7 10*3/uL (ref 1.0–3.6)
MCH: 30.3 pg (ref 26.0–34.0)
MCHC: 32.5 g/dL (ref 32.0–36.0)
MCV: 93.1 fL (ref 80.0–100.0)
Monocytes Absolute: 0.7 10*3/uL (ref 0.2–0.9)
Monocytes Relative: 6 %
NEUTROS PCT: 69 %
Neutro Abs: 8.2 10*3/uL — ABNORMAL HIGH (ref 1.4–6.5)
PLATELETS: 303 10*3/uL (ref 150–440)
RBC: 4.79 MIL/uL (ref 3.80–5.20)
RDW: 13.3 % (ref 11.5–14.5)
WBC: 11.9 10*3/uL — AB (ref 3.6–11.0)

## 2015-10-01 LAB — BASIC METABOLIC PANEL
Anion gap: 10 (ref 5–15)
BUN: 14 mg/dL (ref 6–20)
CALCIUM: 9.8 mg/dL (ref 8.9–10.3)
CHLORIDE: 93 mmol/L — AB (ref 101–111)
CO2: 35 mmol/L — ABNORMAL HIGH (ref 22–32)
CREATININE: 0.66 mg/dL (ref 0.44–1.00)
Glucose, Bld: 248 mg/dL — ABNORMAL HIGH (ref 65–99)
Potassium: 2.9 mmol/L — CL (ref 3.5–5.1)
SODIUM: 138 mmol/L (ref 135–145)

## 2015-10-01 LAB — SURGICAL PCR SCREEN
MRSA, PCR: NEGATIVE
STAPHYLOCOCCUS AUREUS: NEGATIVE

## 2015-10-01 MED ORDER — POTASSIUM CHLORIDE CRYS ER 20 MEQ PO TBCR: 40.0000 meq | EXTENDED_RELEASE_TABLET | Freq: Two times a day (BID) | ORAL | Status: DC

## 2015-10-01 NOTE — Telephone Encounter (Signed)
Mr. Brautigam called returning our call. I told him that his wife needed to start taking Potassium. Instructions were given to patient's husband on how her prescription should be taken (please read below). I also told him that her prescription was sent to their pharmacy. Mr. Latvala understood and had no further questions.

## 2015-10-01 NOTE — Telephone Encounter (Signed)
No authorization is required for outpatient services per automated voice response with insurance.

## 2015-10-01 NOTE — Patient Instructions (Addendum)
  Your procedure is scheduled on: Wednesday Nov.23, 2016. Report to [facility] at 8:15 am.  Remember: Instructions that are not followed completely may result in serious medical risk, up to and including death, or upon the discretion of your surgeon and anesthesiologist your surgery may need to be rescheduled.    _x___ 1. Do not eat food or drink liquids after midnight. No gum chewing or hard candies.      ____ 2. No Alcohol for 24 hours before or after surgery.   ____ 3. Bring all medications with you on the day of surgery if instructed.    __x__ 4. Notify your doctor if there is any change in your medical condition     (cold, fever, infections).     Do not wear jewelry, make-up, hairpins, clips or nail polish.  Do not wear lotions, powders, or perfumes. You may wear deodorant.  Do not shave 48 hours prior to surgery. Men may shave face and neck.  Do not bring valuables to the hospital.    [facility] is not responsible for any belongings or valuables.               Contacts, dentures or bridgework may not be worn into surgery.  Leave your suitcase in the car. After surgery it may be brought to your room.  For patients admitted to the hospital, discharge time is determined by your treatment team.   Patients discharged the day of surgery will not be allowed to drive home.    Please read over the following fact sheets that you were given:   [facility] Preparing for Surgery  __x_ Take these medicines the morning of surgery with A SIP OF WATER:    1. buPROPion (WELLBUTRIN XL)  2. citalopram (CELEXA)  3. levothyroxine (SYNTHROID, LEVOTHROID)    ____ Fleet Enema (as directed)   __x__ Use CHG Soap as directed  _x___ Use inhalers on the day of surgery bring inhalers to hospital.  ____ Stop metformin 2 days prior to surgery  (On Nov. 21, 2016)   _x___ Take 1/2 of usual insulin dose the night before surgery and none on the morning of surgery.   _x___ Stop aspirin as  directed by surgeon.  ____ Stop Anti-inflammatories on does not apply.  May take Tylenol for pain.   ____ Stop supplements until after surgery.    _x__ Bring C-Pap to the hospital.

## 2015-10-01 NOTE — Progress Notes (Signed)
Pat from Pre-admit called at this time. Patient is in office for appointment and has no orders. Orders placed at this time.

## 2015-10-01 NOTE — Telephone Encounter (Signed)
Received call from Pre-admission testing at this time with a critical potassium of 2.9 on this patient. She is scheduled for surgery with Dr. Azalee Course on 10/06/15.  Per Protocol, K-Dur 72meq BID x 5 days has been ordered.  Orders have been placed to re-check potassium the morning of surgery.   Call made to patient at this time to explain the above information. No answer. Left voicemail for return phone call.

## 2015-10-01 NOTE — Pre-Procedure Instructions (Signed)
Lab called this RN with a critical potassium result of 2.9 from today's lab draw.  Called Dr. Geoffry Paradise office, spoke with Angie and gave her potassium result and will send over the lab results with suggestion of starting a potassium supplement and that anesthesia will have the potassium level rechecked the am of surgery.  Also mentioned that the WBC level was elevated too.

## 2015-10-01 NOTE — Telephone Encounter (Signed)
Abigail Butts from [facility] called stating that this patient will have surgery on 10/06/2015 and due to her CPT code procedure, they recommend for patient to be 23 hour inpatient (stay overnight) instead of just inpatient. Basically all they ask if for patient to stay overnight at the hospital due to insurance purposes.  I told her that you (Angie) will be contacting her in reference to this conversation. Abigail Butts agreed and she will be expecting your call.

## 2015-10-01 NOTE — Telephone Encounter (Signed)
Called patient once again. No answer. Left the information on answering machine so that patient may begin the supplements tonight.  Asked for return phone call to know that patient got the message.

## 2015-10-04 NOTE — OR Nursing (Signed)
Discussed pulmonary clearance with Dr Janeann Forehand. Patient ware increased risk for prolonged intubation.

## 2015-10-05 ENCOUNTER — Other Ambulatory Visit: Payer: Self-pay

## 2015-10-05 MED ORDER — IOHEXOL 240 MG/ML SOLN
INTRAMUSCULAR | Status: DC
Start: 2015-10-05 — End: 2015-10-05
  Filled 2015-10-05: qty 100

## 2015-10-05 MED ORDER — FENTANYL CITRATE (PF) 100 MCG/2ML IJ SOLN
INTRAMUSCULAR | Status: AC
  Filled 2015-10-05: qty 2

## 2015-10-05 MED ORDER — MIDAZOLAM HCL 5 MG/5ML IJ SOLN
INTRAMUSCULAR | Status: AC
  Filled 2015-10-05: qty 5

## 2015-10-05 MED ORDER — BUPIVACAINE HCL (PF) 0.25 % IJ SOLN
INTRAMUSCULAR | Status: AC
  Filled 2015-10-05: qty 30

## 2015-10-05 MED ORDER — TRIAMCINOLONE ACETONIDE 40 MG/ML IJ SUSP
INTRAMUSCULAR | Status: AC
  Filled 2015-10-05: qty 2

## 2015-10-06 ENCOUNTER — Ambulatory Visit: Payer: Medicare PPO | Admitting: Certified Registered"

## 2015-10-06 ENCOUNTER — Ambulatory Visit (HOSPITAL_BASED_OUTPATIENT_CLINIC_OR_DEPARTMENT_OTHER)
Admission: RE | Admit: 2015-10-06 | Discharge: 2015-10-06 | Disposition: A | Discharge status: Home / Self Care | Payer: Medicare PPO | Source: Ambulatory Visit | Attending: Surgery | Admitting: Surgery

## 2015-10-06 ENCOUNTER — Encounter
Admission: RE | Disposition: A | Discharge status: Home / Self Care | Payer: Self-pay | Source: Ambulatory Visit | Attending: Surgery

## 2015-10-06 ENCOUNTER — Encounter: Payer: Self-pay | Admitting: *Deleted

## 2015-10-06 ENCOUNTER — Encounter
Admission: RE | Admit: 2015-10-06 | Discharge: 2015-10-06 | Disposition: A | Discharge status: Home / Self Care | Payer: Medicare PPO | Source: Ambulatory Visit | Attending: Surgery | Admitting: Surgery

## 2015-10-06 ENCOUNTER — Ambulatory Visit
Admission: RE | Admit: 2015-10-06 | Discharge: 2015-10-06 | Disposition: A | Discharge status: Home / Self Care | Payer: Medicare PPO | Source: Ambulatory Visit | Attending: Surgery | Admitting: Surgery

## 2015-10-06 ENCOUNTER — Ambulatory Visit: Payer: Medicare PPO

## 2015-10-06 DIAGNOSIS — J449 Chronic obstructive pulmonary disease, unspecified: Secondary | ICD-10-CM | POA: Insufficient documentation

## 2015-10-06 DIAGNOSIS — Z7984 Long term (current) use of oral hypoglycemic drugs: Secondary | ICD-10-CM | POA: Insufficient documentation

## 2015-10-06 DIAGNOSIS — Z91013 Allergy to seafood: Secondary | ICD-10-CM | POA: Insufficient documentation

## 2015-10-06 DIAGNOSIS — I10 Essential (primary) hypertension: Secondary | ICD-10-CM

## 2015-10-06 DIAGNOSIS — E119 Type 2 diabetes mellitus without complications: Secondary | ICD-10-CM

## 2015-10-06 DIAGNOSIS — F17210 Nicotine dependence, cigarettes, uncomplicated: Secondary | ICD-10-CM

## 2015-10-06 DIAGNOSIS — E669 Obesity, unspecified: Secondary | ICD-10-CM

## 2015-10-06 DIAGNOSIS — E785 Hyperlipidemia, unspecified: Secondary | ICD-10-CM

## 2015-10-06 DIAGNOSIS — G4730 Sleep apnea, unspecified: Secondary | ICD-10-CM

## 2015-10-06 DIAGNOSIS — Z833 Family history of diabetes mellitus: Secondary | ICD-10-CM

## 2015-10-06 DIAGNOSIS — M810 Age-related osteoporosis without current pathological fracture: Secondary | ICD-10-CM | POA: Insufficient documentation

## 2015-10-06 DIAGNOSIS — Z6841 Body Mass Index (BMI) 40.0 and over, adult: Secondary | ICD-10-CM | POA: Insufficient documentation

## 2015-10-06 DIAGNOSIS — Z794 Long term (current) use of insulin: Secondary | ICD-10-CM | POA: Insufficient documentation

## 2015-10-06 DIAGNOSIS — E039 Hypothyroidism, unspecified: Secondary | ICD-10-CM

## 2015-10-06 DIAGNOSIS — C50912 Malignant neoplasm of unspecified site of left female breast: Secondary | ICD-10-CM

## 2015-10-06 DIAGNOSIS — Z825 Family history of asthma and other chronic lower respiratory diseases: Secondary | ICD-10-CM | POA: Insufficient documentation

## 2015-10-06 DIAGNOSIS — Z79899 Other long term (current) drug therapy: Secondary | ICD-10-CM

## 2015-10-06 DIAGNOSIS — G5600 Carpal tunnel syndrome, unspecified upper limb: Secondary | ICD-10-CM

## 2015-10-06 DIAGNOSIS — C50212 Malignant neoplasm of upper-inner quadrant of left female breast: Secondary | ICD-10-CM

## 2015-10-06 DIAGNOSIS — Z170 Estrogen receptor positive status [ER+]: Secondary | ICD-10-CM | POA: Insufficient documentation

## 2015-10-06 DIAGNOSIS — N6092 Unspecified benign mammary dysplasia of left breast: Secondary | ICD-10-CM | POA: Insufficient documentation

## 2015-10-06 DIAGNOSIS — L409 Psoriasis, unspecified: Secondary | ICD-10-CM | POA: Insufficient documentation

## 2015-10-06 DIAGNOSIS — Z8249 Family history of ischemic heart disease and other diseases of the circulatory system: Secondary | ICD-10-CM | POA: Insufficient documentation

## 2015-10-06 DIAGNOSIS — F329 Major depressive disorder, single episode, unspecified: Secondary | ICD-10-CM | POA: Insufficient documentation

## 2015-10-06 DIAGNOSIS — E559 Vitamin D deficiency, unspecified: Secondary | ICD-10-CM

## 2015-10-06 DIAGNOSIS — J441 Chronic obstructive pulmonary disease with (acute) exacerbation: Secondary | ICD-10-CM | POA: Diagnosis not present

## 2015-10-06 DIAGNOSIS — J189 Pneumonia, unspecified organism: Secondary | ICD-10-CM | POA: Diagnosis not present

## 2015-10-06 DIAGNOSIS — C50919 Malignant neoplasm of unspecified site of unspecified female breast: Secondary | ICD-10-CM | POA: Insufficient documentation

## 2015-10-06 DIAGNOSIS — Z8349 Family history of other endocrine, nutritional and metabolic diseases: Secondary | ICD-10-CM | POA: Insufficient documentation

## 2015-10-06 DIAGNOSIS — Z7951 Long term (current) use of inhaled steroids: Secondary | ICD-10-CM

## 2015-10-06 HISTORY — PX: BREAST EXCISIONAL BIOPSY: SUR124

## 2015-10-06 HISTORY — PX: BREAST LUMPECTOMY WITH NEEDLE LOCALIZATION: SHX5759

## 2015-10-06 HISTORY — PX: SENTINEL NODE BIOPSY: SHX6608

## 2015-10-06 LAB — GLUCOSE, CAPILLARY: Glucose-Capillary: 212 mg/dL — ABNORMAL HIGH (ref 65–99)

## 2015-10-06 SURGERY — BREAST LUMPECTOMY WITH NEEDLE LOCALIZATION
Anesthesia: General | Site: Breast | Laterality: Left | Wound class: Clean

## 2015-10-06 MED ORDER — OXYCODONE-ACETAMINOPHEN 7.5-325 MG PO TABS: 1.0000 | ORAL_TABLET | ORAL | Status: DC | PRN

## 2015-10-06 MED ORDER — BUPIVACAINE-EPINEPHRINE (PF) 0.5% -1:200000 IJ SOLN
INTRAMUSCULAR | Status: AC
  Filled 2015-10-06: qty 30

## 2015-10-06 MED ORDER — METHYLENE BLUE 1 % INJ SOLN
INTRAMUSCULAR | Status: AC
  Filled 2015-10-06: qty 10

## 2015-10-06 MED ORDER — IPRATROPIUM-ALBUTEROL 0.5-2.5 (3) MG/3ML IN SOLN
3.0000 mL | Freq: Once | RESPIRATORY_TRACT | Status: AC
  Administered 2015-10-06: 3 mL via RESPIRATORY_TRACT

## 2015-10-06 MED ORDER — ONDANSETRON HCL 4 MG/2ML IJ SOLN: 4.0000 mg | Freq: Once | INTRAMUSCULAR | Status: DC | PRN

## 2015-10-06 MED ORDER — ALBUTEROL SULFATE HFA 108 (90 BASE) MCG/ACT IN AERS
INHALATION_SPRAY | RESPIRATORY_TRACT | Status: DC | PRN
  Administered 2015-10-06 (×2): 4 via RESPIRATORY_TRACT

## 2015-10-06 MED ORDER — CHLORHEXIDINE GLUCONATE 4 % EX LIQD: 1.0000 "application " | Freq: Once | CUTANEOUS | Status: DC

## 2015-10-06 MED ORDER — METHYLENE BLUE 1 % INJ SOLN
INTRAMUSCULAR | Status: DC | PRN
  Administered 2015-10-06: 10 mL via INTRADERMAL

## 2015-10-06 MED ORDER — OXYCODONE-ACETAMINOPHEN 7.5-325 MG PO TABS
ORAL_TABLET | ORAL | Status: AC
  Administered 2015-10-06: 1 via ORAL
  Filled 2015-10-06: qty 1

## 2015-10-06 MED ORDER — FAMOTIDINE 20 MG PO TABS
ORAL_TABLET | ORAL | Status: AC
  Administered 2015-10-06: 20 mg via ORAL
  Filled 2015-10-06: qty 1

## 2015-10-06 MED ORDER — ONDANSETRON HCL 4 MG/2ML IJ SOLN
INTRAMUSCULAR | Status: DC | PRN
  Administered 2015-10-06: 4 mg via INTRAVENOUS

## 2015-10-06 MED ORDER — PROPOFOL 10 MG/ML IV BOLUS
INTRAVENOUS | Status: DC | PRN
  Administered 2015-10-06: 200 mg via INTRAVENOUS

## 2015-10-06 MED ORDER — CEFAZOLIN SODIUM-DEXTROSE 2-3 GM-% IV SOLR
2.0000 g | INTRAVENOUS | Status: AC
  Administered 2015-10-06: 2 g via INTRAVENOUS

## 2015-10-06 MED ORDER — FENTANYL CITRATE (PF) 100 MCG/2ML IJ SOLN
25.0000 ug | INTRAMUSCULAR | Status: DC | PRN
  Administered 2015-10-06 (×3): 25 ug via INTRAVENOUS

## 2015-10-06 MED ORDER — BUPIVACAINE-EPINEPHRINE (PF) 0.5% -1:200000 IJ SOLN
INTRAMUSCULAR | Status: DC | PRN
  Administered 2015-10-06: 30 mL

## 2015-10-06 MED ORDER — FENTANYL CITRATE (PF) 100 MCG/2ML IJ SOLN
INTRAMUSCULAR | Status: AC
  Administered 2015-10-06: 25 ug via INTRAVENOUS
  Filled 2015-10-06: qty 2

## 2015-10-06 MED ORDER — TECHNETIUM TC 99M SULFUR COLLOID
1.0700 | Freq: Once | INTRAVENOUS | Status: DC | PRN
  Administered 2015-10-06: 1.07 via INTRAVENOUS
  Filled 2015-10-06: qty 1.07

## 2015-10-06 MED ORDER — CEFAZOLIN SODIUM-DEXTROSE 2-3 GM-% IV SOLR
INTRAVENOUS | Status: AC
  Administered 2015-10-06: 2 g via INTRAVENOUS
  Filled 2015-10-06: qty 50

## 2015-10-06 MED ORDER — OXYCODONE-ACETAMINOPHEN 7.5-325 MG PO TABS
1.0000 | ORAL_TABLET | Freq: Once | ORAL | Status: AC
  Administered 2015-10-06: 1 via ORAL

## 2015-10-06 MED ORDER — SODIUM CHLORIDE 0.9 % IV SOLN
INTRAVENOUS | Status: DC
  Administered 2015-10-06: 10:00:00 via INTRAVENOUS

## 2015-10-06 MED ORDER — SODIUM CHLORIDE 0.9 % IJ SOLN
INTRAMUSCULAR | Status: AC
  Filled 2015-10-06: qty 10

## 2015-10-06 MED ORDER — LIDOCAINE HCL (CARDIAC) 20 MG/ML IV SOLN
INTRAVENOUS | Status: DC | PRN
  Administered 2015-10-06: 100 mg via INTRAVENOUS

## 2015-10-06 MED ORDER — FAMOTIDINE 20 MG PO TABS
20.0000 mg | ORAL_TABLET | Freq: Once | ORAL | Status: AC
  Administered 2015-10-06: 20 mg via ORAL

## 2015-10-06 MED ORDER — FENTANYL CITRATE (PF) 100 MCG/2ML IJ SOLN
INTRAMUSCULAR | Status: DC | PRN
  Administered 2015-10-06: 50 ug via INTRAVENOUS
  Administered 2015-10-06: 25 ug via INTRAVENOUS

## 2015-10-06 MED ORDER — OXYCODONE-ACETAMINOPHEN 7.5-325 MG PO TABS
ORAL_TABLET | ORAL | Status: AC
  Filled 2015-10-06: qty 1

## 2015-10-06 MED ORDER — IPRATROPIUM-ALBUTEROL 0.5-2.5 (3) MG/3ML IN SOLN
RESPIRATORY_TRACT | Status: AC
  Administered 2015-10-06: 3 mL via RESPIRATORY_TRACT
  Filled 2015-10-06: qty 3

## 2015-10-06 SURGICAL SUPPLY — 41 items
BLADE SURG 15 STRL LF DISP TIS (BLADE) IMPLANT
BLADE SURG 15 STRL SS (BLADE) ×3
CANISTER SUCT 1200ML W/VALVE (MISCELLANEOUS) ×3 IMPLANT
CHLORAPREP W/TINT 26ML (MISCELLANEOUS) ×5 IMPLANT
CNTNR SPEC 2.5X3XGRAD LEK (MISCELLANEOUS) ×3
CONT SPEC 4OZ STER OR WHT (MISCELLANEOUS) ×6
CONT SPEC 4OZ STRL OR WHT (MISCELLANEOUS) ×3
CONTAINER SPEC 2.5X3XGRAD LEK (MISCELLANEOUS) ×3 IMPLANT
DEVICE DUBIN SPECIMEN MAMMOGRA (MISCELLANEOUS) ×3 IMPLANT
DRAPE LAPAROTOMY TRNSV 106X77 (MISCELLANEOUS) ×3 IMPLANT
DRAPE SHEET LG 3/4 BI-LAMINATE (DRAPES) ×3 IMPLANT
ELECT CAUTERY BLADE 6.4 (BLADE) ×3 IMPLANT
GAUZE FLUFF 18X24 1PLY STRL (GAUZE/BANDAGES/DRESSINGS) ×2 IMPLANT
GLOVE PI ORTHOPRO 6.5 (GLOVE) ×2
GLOVE PI ORTHOPRO STRL 6.5 (GLOVE) ×1 IMPLANT
GOWN STRL REUS W/ TWL LRG LVL3 (GOWN DISPOSABLE) ×2 IMPLANT
GOWN STRL REUS W/TWL LRG LVL3 (GOWN DISPOSABLE) ×6
LIQUID BAND (GAUZE/BANDAGES/DRESSINGS) ×3 IMPLANT
MARGIN MAP 10MM (MISCELLANEOUS) ×3 IMPLANT
NDL HYPO 25X1 1.5 SAFETY (NEEDLE) ×2 IMPLANT
NDL SAFETY ECLIPSE 18X1.5 (NEEDLE) ×1 IMPLANT
NEEDLE HYPO 18GX1.5 SHARP (NEEDLE) ×3
NEEDLE HYPO 25X1 1.5 SAFETY (NEEDLE) ×6 IMPLANT
PACK BASIN MINOR ARMC (MISCELLANEOUS) ×3 IMPLANT
PAD GROUND ADULT SPLIT (MISCELLANEOUS) ×3 IMPLANT
SLEVE PROBE SENORX GAMMA FIND (MISCELLANEOUS) ×3 IMPLANT
SPONGE XRAY 4X4 16PLY STRL (MISCELLANEOUS) ×2 IMPLANT
STOCKINETTE IMPERVIOUS 9X36 MD (GAUZE/BANDAGES/DRESSINGS) ×1 IMPLANT
SURGI-BRA 2X (MISCELLANEOUS) ×2 IMPLANT
SURGI-BRA XL (MISCELLANEOUS) IMPLANT
SUT ETHILON 3-0 FS-10 30 BLK (SUTURE) ×3
SUT MNCRL 3 0 RB1 (SUTURE) IMPLANT
SUT MNCRL 3-0 UNDYED SH (SUTURE) ×1 IMPLANT
SUT MNCRL 4-0 (SUTURE) ×3
SUT MNCRL 4-0 27XMFL (SUTURE) ×1
SUT MONOCRYL 3 0 RB1 (SUTURE) ×2
SUT MONOCRYL 3-0 UNDYED (SUTURE) ×2
SUTURE EHLN 3-0 FS-10 30 BLK (SUTURE) ×1 IMPLANT
SUTURE MNCRL 4-0 27XMF (SUTURE) ×1 IMPLANT
SYR BULB IRRIG 60ML STRL (SYRINGE) ×2 IMPLANT
SYRINGE 10CC LL (SYRINGE) ×6 IMPLANT

## 2015-10-06 NOTE — Transfer of Care (Signed)
Immediate Anesthesia Transfer of Care Note  Patient: Ebony [name]  Procedure(s) Performed: Procedure(s): BREAST LUMPECTOMY WITH NEEDLE LOCALIZATION (Left) SENTINEL NODE BIOPSY (Left)  Patient Location: PACU  Anesthesia Type:General  Level of Consciousness: awake, alert , oriented and patient cooperative  Airway & Oxygen Therapy: Patient Spontanous Breathing and Patient connected to face mask oxygen  Post-op Assessment: Report given to RN, Post -op Vital signs reviewed and stable and Patient moving all extremities X 4  Post vital signs: Reviewed and stable  Last Vitals:  Filed Vitals:   10/06/15 0943 10/06/15 0944  BP:  155/70  Pulse: 56   Temp: 36.6 C   Resp: 18     Complications: No apparent anesthesia complications

## 2015-10-06 NOTE — Progress Notes (Signed)
Patient arrive to my unit in extreme pain. An ice pack was placed on her left breast and another percocet was administered. The percocet was of the electronic order for 1-2 tablets once every 4 hours as needed for pain. The lights in the room have been turned down and the family is at the bedside. Monitoring will continue.

## 2015-10-06 NOTE — H&P (View-Only) (Signed)
Subjective:     Patient ID: Ebony [name], female   DOB: 12/11/1945, 69 y.o.   MRN: [id number]  HPI  69 yr old female with left breast ca seen on biopsy.  Patient went in for normal mammogram, had been 2 yrs since previous and they found a 5mm area on the left breast.  Patient did not feel a palpable mass.  She has not noticed any pain, changes to skin, changes to nipple or nipple discharge.  Biopsy revealed invasive ductal ER+, PR+, Her2 -.  Patient does not have any immediate realtives with breast CA or other types of cancer. She had two children starting at age 24. She did not breast feed.  She was on birthcontrol for 20 years.  She went through menopause at age 62, she has not taken any hormone therapy.    She does have COPD, uses a rescue inhaler about twice a week. She is limited by her breathing and can go about 25ft before she has to rest.  She still smokes about 1/2 pack a day.  She does not get chest pain with exertion.    Past Medical History  Diagnosis Date  . Hyperlipidemia   . Hypertension   . Diabetes mellitus type 2, uncomplicated (HCC)   . Hypothyroidism   . Psoriasis   . Depression   . Carpal tunnel syndrome   . COPD (chronic obstructive pulmonary disease) (HCC)   . Osteoporosis, post-menopausal   . Vitamin D deficiency   . Sleep apnea    Past Surgical History  Procedure Laterality Date  . Knee surgery    . Tubal ligation    . Right heel spur removed     Family History  Problem Relation Age of Onset  . Diabetes Mother   . Thyroid disease Mother   . COPD Father   . Emphysema Father   . Heart disease Brother   . Diabetes Brother   . Heart disease Brother   . Thyroid disease Daughter   . Thyroid disease Son    Social History   Social History  . Marital Status: Married    Spouse Name: N/A  . Number of Children: N/A  . Years of Education: N/A   Social History Main Topics  . Smoking status: Current Every Day Smoker -- 1.50 packs/day for 30 years   Types: Cigarettes  . Smokeless tobacco: Never Used  . Alcohol Use: No  . Drug Use: No  . Sexual Activity: Not Asked   Other Topics Concern  . None   Social History Narrative    Current outpatient prescriptions:  .  Adalimumab (HUMIRA PEN-PSORIASIS STARTER) 40 MG/0.8ML PNKT, Inject into the skin., Disp: , Rfl:  .  albuterol (PROAIR HFA) 108 (90 BASE) MCG/ACT inhaler, INHALE 2 PUFFS BY MOUTH FOUR TIMES DAILY AS NEEDED, Disp: , Rfl:  .  albuterol (PROVENTIL) (2.5 MG/3ML) 0.083% nebulizer solution, Inhale 1 vial using nebulizer every six hours as needed, Disp: , Rfl:  .  aspirin EC 81 MG tablet, Take by mouth., Disp: , Rfl:  .  budesonide-formoterol (SYMBICORT) 160-4.5 MCG/ACT inhaler, INHALE 2 PUFFS BY MOUTH TWICE DAILY, Disp: , Rfl:  .  buPROPion (WELLBUTRIN XL) 150 MG 24 hr tablet, Take by mouth., Disp: , Rfl:  .  citalopram (CELEXA) 40 MG tablet, once daily., Disp: , Rfl:  .  furosemide (LASIX) 20 MG tablet, 2 (two) times daily., Disp: , Rfl:  .  glipiZIDE (GLUCOTROL) 5 MG tablet, Take 1 tablet by mouth   1 day or 1 dose., Disp: , Rfl:  .  insulin aspart (NOVOLOG) 100 UNIT/ML injection, 35-45 units subcutaneously twice a day, Disp: , Rfl:  .  insulin detemir (LEVEMIR) 100 UNIT/ML injection, Inject 55-68 units subcutaneously twice a day, Disp: , Rfl:  .  levothyroxine (SYNTHROID, LEVOTHROID) 200 MCG tablet, Take by mouth., Disp: , Rfl:  .  levothyroxine (SYNTHROID, LEVOTHROID) 50 MCG tablet, Take by mouth., Disp: , Rfl:  .  losartan-hydrochlorothiazide (HYZAAR) 100-25 MG tablet, Take by mouth., Disp: , Rfl:  .  metFORMIN (GLUCOPHAGE-XR) 500 MG 24 hr tablet, Take by mouth., Disp: , Rfl:  .  simvastatin (ZOCOR) 40 MG tablet, Take by mouth., Disp: , Rfl:  .  tiotropium (SPIRIVA HANDIHALER) 18 MCG inhalation capsule, INHALE 1 CAPSULE AS DIRECTED ONCE DAILY., Disp: , Rfl:  .  vitamin E 400 UNIT capsule, Take by mouth., Disp: , Rfl:  Allergies  Allergen Reactions  . Other Hives and Rash     Shellfish. Shellfish       Review of Systems  Constitutional: Negative for fever, chills, activity change, appetite change and fatigue.  HENT: Negative for congestion and sore throat.   Respiratory: Positive for cough, shortness of breath and wheezing. Negative for choking, chest tightness and stridor.   Cardiovascular: Negative for chest pain, palpitations and leg swelling.  Gastrointestinal: Negative for nausea, vomiting, abdominal pain, diarrhea, constipation, blood in stool, abdominal distention and anal bleeding.  Genitourinary: Negative for dysuria and hematuria.  Musculoskeletal: Positive for back pain, joint swelling and gait problem.  Skin: Positive for color change and rash.  Neurological: Negative for dizziness, tremors and weakness.  Hematological: Negative for adenopathy. Does not bruise/bleed easily.  Psychiatric/Behavioral: Negative for agitation. The patient is nervous/anxious.   All other systems reviewed and are negative.  Filed Vitals:   09/09/15 1452  BP: 111/69  Pulse: 75  Temp: 97.7 F (36.5 C)       Objective:   Physical Exam  Constitutional: She is oriented to person, place, and time. She appears well-developed and well-nourished. No distress.  HENT:  Head: Normocephalic and atraumatic.  Right Ear: External ear normal.  Left Ear: External ear normal.  Nose: Nose normal.  Mouth/Throat: Oropharynx is clear and moist.  Eyes: Conjunctivae are normal. Pupils are equal, round, and reactive to light. No scleral icterus.  Neck: Normal range of motion. Neck supple. No tracheal deviation present.  Cardiovascular: Normal rate, regular rhythm, normal heart sounds and intact distal pulses.  Exam reveals no gallop and no friction rub.   No murmur heard. Pulmonary/Chest: No respiratory distress. She has wheezes. She has no rales.  Some effort with breathing  Abdominal: Soft. Bowel sounds are normal. She exhibits no distension. There is no tenderness. There is no  rebound.  Musculoskeletal: Normal range of motion. She exhibits no edema or tenderness.  Neurological: She is alert and oriented to person, place, and time. No cranial nerve deficit.  Skin: Skin is warm and dry. Rash noted. No erythema.  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 Her behavior is normal. Judgment and thought content normal.  Vitals reviewed. Breast Exam:  Right breast: normal breast tissue without any masses, no nipple discharge or skin retraction, no cervical supraclavicular or axillary lymphadenopathy Left breast: no palpable mass, small bruise from biopsy site at 12'oclock position, no other skin changes or retraction, no cervical, supraclavicular or axillary lymphadenopathy       Assessment:     69 yr old female with left breast CA    Plan:     I discussed the available options with the patient and her husband.  The risk of recurrence is similar between mastectomy and lumpectomy with radiation.  I also discussed that given the small size of the cancer would recommend a needle localization lumpectomy with radiation to follow.  I also discussed that we would need to do a sentinel lymph node biopsy to check the nodes.  Explained to the patient that after her surgical treatment she would also need an aromatase inhibitor since her tumor was ER/PR +.    I also discussed that given her lung disease she is higher risk for surgery.  I discussed risks of bleeding, infection, damage to surrounding tissues, having positive margins, needing further resection, damage to nerves causing arm numbness or difficulty raising arm, causing lymphoedema in the arm; as well as anesthesia risks of MI, stroke, prolonged ventilation, pulmonary embolism, thrombosis and even death.   Patient was given the opportunity to ask questions and have them answered.  They would like to proceed with Left breast needle  localized lumpectomy with sentinel lymph node biopsy.   I will have her see the pulmonologist and pre-anesthesia clinic; will schedule her ASAP after these appointments.

## 2015-10-06 NOTE — Progress Notes (Signed)
Oxygen sat decreases to 88 after fentanyl  Turned oxygen to 4 liters  Sat now 92  Pt arousable but sleepy

## 2015-10-06 NOTE — Progress Notes (Signed)
Po medication given  Having pain but breathing a little better  Dr Marcello Moores called about blood sugar of 212

## 2015-10-06 NOTE — Anesthesia Preprocedure Evaluation (Addendum)
Anesthesia Evaluation  Patient identified by MRN, date of birth, ID band Patient awake    Reviewed: Allergy & Precautions, NPO status , Patient's Chart, lab work & pertinent test results, reviewed documented beta blocker date and time   Airway Mallampati: III  TM Distance: >3 FB     Dental  (+) Chipped, Missing   Pulmonary sleep apnea , COPD, Current Smoker,           Cardiovascular hypertension, Pt. on medications      Neuro/Psych PSYCHIATRIC DISORDERS Depression  Neuromuscular disease    GI/Hepatic   Endo/Other  diabetes, Type 2Hypothyroidism   Renal/GU      Musculoskeletal   Abdominal   Peds  Hematology   Anesthesia Other Findings Obesity.  Reproductive/Obstetrics                            Anesthesia Physical Anesthesia Plan  ASA: III  Anesthesia Plan: General   Post-op Pain Management:    Induction: Intravenous  Airway Management Planned: LMA  Additional Equipment:   Intra-op Plan:   Post-operative Plan:   Informed Consent: I have reviewed the patients History and Physical, chart, labs and discussed the procedure including the risks, benefits and alternatives for the proposed anesthesia with the patient or authorized representative who has indicated his/her understanding and acceptance.     Plan Discussed with: CRNA  Anesthesia Plan Comments:         Anesthesia Quick Evaluation

## 2015-10-06 NOTE — Interval H&P Note (Signed)
History and Physical Interval Note:  10/06/2015 10:17 AM  Ebony [name]  has presented today for surgery, with the diagnosis of MALIGNANT NEOPLASM OFLEFT BREAST  The various methods of treatment have been discussed with the patient and family. After consideration of risks, benefits and other options for treatment, the patient has consented to  Procedure(s): BREAST LUMPECTOMY WITH NEEDLE LOCALIZATION (Left) SENTINEL NODE BIOPSY (Left) as a surgical intervention .  The patient's history has been reviewed, patient examined, no change in status, stable for surgery.  I have reviewed the patient's chart and labs.  Questions were answered to the patient's satisfaction.     Joline Encalada L Kajsa Butrum

## 2015-10-06 NOTE — Progress Notes (Signed)
Dr Azalee Course in to see pt aware of pt breathing  Dr Azalee Course says this pt baseline   Will get pt po pain medication for pain level 5 out of 10

## 2015-10-06 NOTE — Progress Notes (Signed)
Called dr Azalee Course  About pt difficult with breathing   Possible admit  Dr Azalee Course aware of pt breathing status  Still wants to send pt home

## 2015-10-06 NOTE — Anesthesia Procedure Notes (Signed)
Procedure Name: LMA Insertion Date/Time: 10/06/2015 10:31 AM Performed by: Silvana Newness Pre-anesthesia Checklist: Patient identified, Emergency Drugs available, Suction available, Patient being monitored and Timeout performed Patient Re-evaluated:Patient Re-evaluated prior to inductionOxygen Delivery Method: Circle system utilized Preoxygenation: Pre-oxygenation with 100% oxygen Intubation Type: IV induction Ventilation: Two handed mask ventilation required LMA: LMA inserted LMA Size: 3.5 Number of attempts: 1 Placement Confirmation: positive ETCO2 and breath sounds checked- equal and bilateral Tube secured with: Tape Dental Injury: Teeth and Oropharynx as per pre-operative assessment

## 2015-10-06 NOTE — Op Note (Signed)
Breast Lumpectomy with Sentinal Node Biopsy Procedure Note  Indications: This patient presents with history of left breast cancer with clinically negative axillary lymph node exam.  Pre-operative Diagnosis: left breast cancer  Post-operative Diagnosis: left breast cancer  Surgeon: Hubbard Robinson   Assistants: none  Anesthesia: General LMA anesthesia  ASA Class: 4  Procedure Details  The patient was seen in the Holding Room. The risks, benefits, complications, treatment options, and expected outcomes were discussed with the patient. The possibilities of reaction to medication, pulmonary aspiration, bleeding, infection, the need for additional procedures, failure to diagnose a condition, and creating a complication requiring transfusion or operation were discussed with the patient. The patient concurred with the proposed plan, giving informed consent.  The site of surgery properly noted/marked. The patient was taken to the operating room suite, identified as Ebony [name] and the procedure verified as Breast Lumpectomy and Sentinal Node Biopsy. A Time Out was held and the above information confirmed.  After induction of anesthesia, the left arm, breast, and chest were prepped and draped in standard fashion.  Methylene blue dye was injected subareolar and area massaged for 5 minutes.  Using a hand-held gamma probe, axillary sentinel nodes were identified transcutaneously.  An oblique incision was created below the axillary hairline.  Dissection was carried through the clavipectoral fascia.  One axillary sentinel nodes were removed and submitted to pathology.  The incision site was irrigated with saline and hemostasis achieved.  The axillary incision was closed with a 4-0 Vicryl subcuticular closure in layers.    The lumpectomy was performed by creating an transverse incision over the upper inner quadrant of the breastaround the previously placed localization guidewire.  Dissection was  carried down to the pectoral fascia.  Orientation sutures were placed and the skin.  Specimen radiography confirmed inclusion of the mammographic lesion. .  Additional tissue was taken along the medial, lateral, inferior and superior margin and submitted separately to pathology after providing orientation for the pathology.  Specimen sent to pathology which also confirmed grossly negative margins.  Hemostasis was achieved with cautery.  The wound was irrigated and closed with a 4-0 Vicryl subcuticular closure in layers.   Sterile glue was placed on the incision sites and fluffs and bra were placed.   At the end of the operation, all sponge, instrument, and needle counts were correct.  Findings: grossly clear surgical margins  Estimated Blood Loss:  less than 50 mL         Drains: none         Total IV Fluids: 1062ml         Specimens: Left sentinel lymph node; Left lumpectomy specimen, with superior, medial, lateral and inferior margins   Complications:  None; patient tolerated the procedure well.         Disposition: PACU - hemodynamically stable.         Condition: stable

## 2015-10-07 ENCOUNTER — Emergency Department: Payer: Medicare PPO

## 2015-10-07 ENCOUNTER — Inpatient Hospital Stay
Admission: EM | Admit: 2015-10-07 | Discharge: 2015-10-12 | DRG: 193 | Disposition: A | Discharge status: Home / Self Care | Payer: Medicare PPO | Attending: Internal Medicine | Admitting: Internal Medicine

## 2015-10-07 DIAGNOSIS — E559 Vitamin D deficiency, unspecified: Secondary | ICD-10-CM | POA: Diagnosis present

## 2015-10-07 DIAGNOSIS — E785 Hyperlipidemia, unspecified: Secondary | ICD-10-CM | POA: Diagnosis present

## 2015-10-07 DIAGNOSIS — J9602 Acute respiratory failure with hypercapnia: Secondary | ICD-10-CM | POA: Diagnosis present

## 2015-10-07 DIAGNOSIS — I10 Essential (primary) hypertension: Secondary | ICD-10-CM | POA: Diagnosis present

## 2015-10-07 DIAGNOSIS — Z7984 Long term (current) use of oral hypoglycemic drugs: Secondary | ICD-10-CM

## 2015-10-07 DIAGNOSIS — J9601 Acute respiratory failure with hypoxia: Secondary | ICD-10-CM

## 2015-10-07 DIAGNOSIS — G4733 Obstructive sleep apnea (adult) (pediatric): Secondary | ICD-10-CM | POA: Diagnosis present

## 2015-10-07 DIAGNOSIS — E669 Obesity, unspecified: Secondary | ICD-10-CM | POA: Diagnosis present

## 2015-10-07 DIAGNOSIS — J441 Chronic obstructive pulmonary disease with (acute) exacerbation: Secondary | ICD-10-CM

## 2015-10-07 DIAGNOSIS — Z79891 Long term (current) use of opiate analgesic: Secondary | ICD-10-CM | POA: Diagnosis not present

## 2015-10-07 DIAGNOSIS — Z7982 Long term (current) use of aspirin: Secondary | ICD-10-CM

## 2015-10-07 DIAGNOSIS — J189 Pneumonia, unspecified organism: Secondary | ICD-10-CM | POA: Diagnosis present

## 2015-10-07 DIAGNOSIS — R911 Solitary pulmonary nodule: Secondary | ICD-10-CM | POA: Diagnosis not present

## 2015-10-07 DIAGNOSIS — Z794 Long term (current) use of insulin: Secondary | ICD-10-CM | POA: Diagnosis not present

## 2015-10-07 DIAGNOSIS — Y95 Nosocomial condition: Secondary | ICD-10-CM | POA: Diagnosis present

## 2015-10-07 DIAGNOSIS — J9801 Acute bronchospasm: Secondary | ICD-10-CM | POA: Diagnosis present

## 2015-10-07 DIAGNOSIS — Z79899 Other long term (current) drug therapy: Secondary | ICD-10-CM

## 2015-10-07 DIAGNOSIS — Z6841 Body Mass Index (BMI) 40.0 and over, adult: Secondary | ICD-10-CM

## 2015-10-07 DIAGNOSIS — Z91013 Allergy to seafood: Secondary | ICD-10-CM

## 2015-10-07 DIAGNOSIS — L409 Psoriasis, unspecified: Secondary | ICD-10-CM | POA: Diagnosis present

## 2015-10-07 DIAGNOSIS — F329 Major depressive disorder, single episode, unspecified: Secondary | ICD-10-CM | POA: Diagnosis present

## 2015-10-07 DIAGNOSIS — M810 Age-related osteoporosis without current pathological fracture: Secondary | ICD-10-CM | POA: Diagnosis present

## 2015-10-07 DIAGNOSIS — C50912 Malignant neoplasm of unspecified site of left female breast: Secondary | ICD-10-CM | POA: Diagnosis present

## 2015-10-07 DIAGNOSIS — E119 Type 2 diabetes mellitus without complications: Secondary | ICD-10-CM | POA: Diagnosis present

## 2015-10-07 DIAGNOSIS — E875 Hyperkalemia: Secondary | ICD-10-CM | POA: Diagnosis present

## 2015-10-07 DIAGNOSIS — Z4889 Encounter for other specified surgical aftercare: Secondary | ICD-10-CM | POA: Insufficient documentation

## 2015-10-07 DIAGNOSIS — F17210 Nicotine dependence, cigarettes, uncomplicated: Secondary | ICD-10-CM | POA: Diagnosis present

## 2015-10-07 DIAGNOSIS — E039 Hypothyroidism, unspecified: Secondary | ICD-10-CM | POA: Diagnosis present

## 2015-10-07 DIAGNOSIS — D71 Functional disorders of polymorphonuclear neutrophils: Secondary | ICD-10-CM | POA: Diagnosis present

## 2015-10-07 DIAGNOSIS — R0902 Hypoxemia: Secondary | ICD-10-CM

## 2015-10-07 LAB — BLOOD GAS, ARTERIAL
ALLENS TEST (PASS/FAIL): POSITIVE — AB
Acid-Base Excess: 0.5 mmol/L (ref 0.0–3.0)
Acid-Base Excess: 1.9 mmol/L (ref 0.0–3.0)
Acid-Base Excess: 4 mmol/L — ABNORMAL HIGH (ref 0.0–3.0)
Allens test (pass/fail): POSITIVE — AB
Allens test (pass/fail): POSITIVE — AB
BICARBONATE: 30.3 meq/L — AB (ref 21.0–28.0)
BICARBONATE: 31 meq/L — AB (ref 21.0–28.0)
BICARBONATE: 31.6 meq/L — AB (ref 21.0–28.0)
Delivery systems: POSITIVE
Delivery systems: POSITIVE
Delivery systems: POSITIVE
EXPIRATORY PAP: 6
EXPIRATORY PAP: 6
EXPIRATORY PAP: 6
FIO2: 0.5
FIO2: 0.4
FIO2: 40
INSPIRATORY PAP: 12
INSPIRATORY PAP: 16
Inspiratory PAP: 16
MECHANICAL RATE: 8
O2 SAT: 98 %
O2 SAT: 95.8 %
O2 SAT: 96.8 %
PATIENT TEMPERATURE: 37
PATIENT TEMPERATURE: 37
PH ART: 7.22 — AB (ref 7.350–7.450)
PH ART: 7.33 — AB (ref 7.350–7.450)
PO2 ART: 121 mmHg — AB (ref 83.0–108.0)
PO2 ART: 92 mmHg (ref 83.0–108.0)
PO2 ART: 94 mmHg (ref 83.0–108.0)
Patient temperature: 37
RATE: 8 resp/min
pCO2 arterial: 74 mmHg (ref 32.0–48.0)
pCO2 arterial: 69 mmHg — ABNORMAL HIGH (ref 32.0–48.0)
pCO2 arterial: 60 mmHg — ABNORMAL HIGH (ref 32.0–48.0)
pH, Arterial: 7.26 — ABNORMAL LOW (ref 7.350–7.450)

## 2015-10-07 LAB — BASIC METABOLIC PANEL
ANION GAP: 6 (ref 5–15)
BUN: 11 mg/dL (ref 6–20)
CALCIUM: 8.9 mg/dL (ref 8.9–10.3)
CO2: 29 mmol/L (ref 22–32)
Chloride: 103 mmol/L (ref 101–111)
Creatinine, Ser: 0.67 mg/dL (ref 0.44–1.00)
Glucose, Bld: 237 mg/dL — ABNORMAL HIGH (ref 65–99)
Potassium: 5.6 mmol/L — ABNORMAL HIGH (ref 3.5–5.1)
Sodium: 138 mmol/L (ref 135–145)

## 2015-10-07 LAB — GLUCOSE, CAPILLARY
Glucose-Capillary: 283 mg/dL — ABNORMAL HIGH (ref 65–99)
Glucose-Capillary: 261 mg/dL — ABNORMAL HIGH (ref 65–99)

## 2015-10-07 LAB — CBC
HCT: 40.6 % (ref 35.0–47.0)
HEMOGLOBIN: 12.8 g/dL (ref 12.0–16.0)
MCH: 30 pg (ref 26.0–34.0)
MCHC: 31.5 g/dL — ABNORMAL LOW (ref 32.0–36.0)
MCV: 95.1 fL (ref 80.0–100.0)
Platelets: 277 10*3/uL (ref 150–440)
RBC: 4.27 MIL/uL (ref 3.80–5.20)
RDW: 13.6 % (ref 11.5–14.5)
WBC: 13 10*3/uL — AB (ref 3.6–11.0)

## 2015-10-07 LAB — TROPONIN I: Troponin I: <0.03 ng/mL (ref <0.031)

## 2015-10-07 LAB — POTASSIUM: Potassium: 4.9 mmol/L (ref 3.5–5.1)

## 2015-10-07 LAB — HEMOGLOBIN A1C: HEMOGLOBIN A1C: 8 % — AB (ref 4.0–6.0)

## 2015-10-07 MED ORDER — LOSARTAN POTASSIUM 50 MG PO TABS
100.0000 mg | ORAL_TABLET | Freq: Every day | ORAL | Status: DC
  Administered 2015-10-08 – 2015-10-12 (×5): 100 mg via ORAL
  Filled 2015-10-07 (×5): qty 2

## 2015-10-07 MED ORDER — IPRATROPIUM-ALBUTEROL 0.5-2.5 (3) MG/3ML IN SOLN
3.0000 mL | Freq: Four times a day (QID) | RESPIRATORY_TRACT | Status: DC
  Administered 2015-10-07 – 2015-10-09 (×7): 3 mL via RESPIRATORY_TRACT
  Filled 2015-10-07 (×7): qty 3

## 2015-10-07 MED ORDER — INSULIN ASPART 100 UNIT/ML ~~LOC~~ SOLN
0.0000 [IU] | Freq: Three times a day (TID) | SUBCUTANEOUS | Status: DC
  Administered 2015-10-08 (×2): 7 [IU] via SUBCUTANEOUS
  Administered 2015-10-08: 2 [IU] via SUBCUTANEOUS
  Administered 2015-10-09: 12:00:00 5 [IU] via SUBCUTANEOUS
  Administered 2015-10-09: 1 [IU] via SUBCUTANEOUS
  Administered 2015-10-09: 5 [IU] via SUBCUTANEOUS
  Administered 2015-10-10: 12:00:00 2 [IU] via SUBCUTANEOUS
  Administered 2015-10-10: 18:00:00 3 [IU] via SUBCUTANEOUS
  Administered 2015-10-11: 09:00:00 1 [IU] via SUBCUTANEOUS
  Administered 2015-10-11 (×2): 3 [IU] via SUBCUTANEOUS
  Administered 2015-10-12 (×2): 2 [IU] via SUBCUTANEOUS
  Filled 2015-10-07: qty 3
  Filled 2015-10-07: qty 2
  Filled 2015-10-07: qty 5
  Filled 2015-10-07 (×2): qty 7
  Filled 2015-10-07 (×2): qty 3
  Filled 2015-10-07: qty 1
  Filled 2015-10-07: qty 3
  Filled 2015-10-07: qty 1
  Filled 2015-10-07: qty 2

## 2015-10-07 MED ORDER — INSULIN DETEMIR 100 UNIT/ML ~~LOC~~ SOLN
50.0000 [IU] | Freq: Two times a day (BID) | SUBCUTANEOUS | Status: DC
  Administered 2015-10-07 – 2015-10-12 (×10): 50 [IU] via SUBCUTANEOUS
  Filled 2015-10-07 (×12): qty 0.5

## 2015-10-07 MED ORDER — BUDESONIDE 0.25 MG/2ML IN SUSP
0.2500 mg | Freq: Two times a day (BID) | RESPIRATORY_TRACT | Status: AC
  Administered 2015-10-07 – 2015-10-09 (×5): 0.25 mg via RESPIRATORY_TRACT
  Filled 2015-10-07 (×6): qty 2

## 2015-10-07 MED ORDER — LOSARTAN POTASSIUM-HCTZ 100-25 MG PO TABS: 1.0000 | ORAL_TABLET | Freq: Every day | ORAL | Status: DC

## 2015-10-07 MED ORDER — LEVOTHYROXINE SODIUM 88 MCG PO TABS
200.0000 ug | ORAL_TABLET | Freq: Every day | ORAL | Status: DC
  Administered 2015-10-08 – 2015-10-12 (×5): 200 ug via ORAL
  Filled 2015-10-07: qty 1
  Filled 2015-10-07: qty 2
  Filled 2015-10-07 (×3): qty 1

## 2015-10-07 MED ORDER — CITALOPRAM HYDROBROMIDE 20 MG PO TABS
20.0000 mg | ORAL_TABLET | Freq: Every day | ORAL | Status: DC
  Administered 2015-10-08 – 2015-10-12 (×5): 20 mg via ORAL
  Filled 2015-10-07 (×5): qty 1

## 2015-10-07 MED ORDER — SIMVASTATIN 40 MG PO TABS
40.0000 mg | ORAL_TABLET | Freq: Every day | ORAL | Status: DC
  Administered 2015-10-08 – 2015-10-11 (×4): 40 mg via ORAL
  Filled 2015-10-07 (×4): qty 1

## 2015-10-07 MED ORDER — IPRATROPIUM-ALBUTEROL 0.5-2.5 (3) MG/3ML IN SOLN
3.0000 mL | Freq: Four times a day (QID) | RESPIRATORY_TRACT | Status: DC
Start: 2015-10-07 — End: 2015-10-07

## 2015-10-07 MED ORDER — BUDESONIDE-FORMOTEROL FUMARATE 160-4.5 MCG/ACT IN AERO
2.0000 | INHALATION_SPRAY | Freq: Two times a day (BID) | RESPIRATORY_TRACT | Status: DC
  Filled 2015-10-07: qty 6

## 2015-10-07 MED ORDER — DEXTROSE 5 % IV SOLN
500.0000 mg | INTRAVENOUS | Status: DC
  Administered 2015-10-07: 500 mg via INTRAVENOUS
  Filled 2015-10-07: qty 500

## 2015-10-07 MED ORDER — LEVOTHYROXINE SODIUM 50 MCG PO TABS
50.0000 ug | ORAL_TABLET | Freq: Every day | ORAL | Status: DC
  Administered 2015-10-08 – 2015-10-12 (×5): 50 ug via ORAL
  Filled 2015-10-07 (×4): qty 1

## 2015-10-07 MED ORDER — ONDANSETRON HCL 4 MG PO TABS: 4.0000 mg | ORAL_TABLET | Freq: Four times a day (QID) | ORAL | Status: DC | PRN

## 2015-10-07 MED ORDER — ACETAMINOPHEN 650 MG RE SUPP: 650.0000 mg | Freq: Four times a day (QID) | RECTAL | Status: DC | PRN

## 2015-10-07 MED ORDER — FUROSEMIDE 10 MG/ML IJ SOLN
20.0000 mg | Freq: Once | INTRAMUSCULAR | Status: AC
  Administered 2015-10-07: 20 mg via INTRAVENOUS
  Filled 2015-10-07: qty 4

## 2015-10-07 MED ORDER — BUDESONIDE 0.25 MG/2ML IN SUSP: 0.2500 mg | Freq: Four times a day (QID) | RESPIRATORY_TRACT | Status: DC

## 2015-10-07 MED ORDER — METFORMIN HCL ER 500 MG PO TB24
500.0000 mg | ORAL_TABLET | Freq: Two times a day (BID) | ORAL | Status: DC
  Administered 2015-10-08 – 2015-10-12 (×9): 500 mg via ORAL
  Filled 2015-10-07 (×9): qty 1

## 2015-10-07 MED ORDER — ASPIRIN EC 81 MG PO TBEC
81.0000 mg | DELAYED_RELEASE_TABLET | Freq: Every day | ORAL | Status: DC
  Administered 2015-10-08 – 2015-10-12 (×5): 81 mg via ORAL
  Filled 2015-10-07 (×5): qty 1

## 2015-10-07 MED ORDER — NALOXONE HCL 2 MG/2ML IJ SOSY
1.0000 mg | PREFILLED_SYRINGE | Freq: Once | INTRAMUSCULAR | Status: AC
  Administered 2015-10-07: 1 mg via INTRAVENOUS

## 2015-10-07 MED ORDER — ACETAMINOPHEN 325 MG PO TABS: 650.0000 mg | ORAL_TABLET | Freq: Four times a day (QID) | ORAL | Status: DC | PRN

## 2015-10-07 MED ORDER — ENOXAPARIN SODIUM 40 MG/0.4ML ~~LOC~~ SOLN
40.0000 mg | Freq: Two times a day (BID) | SUBCUTANEOUS | Status: DC
  Administered 2015-10-07 – 2015-10-12 (×11): 40 mg via SUBCUTANEOUS
  Filled 2015-10-07 (×11): qty 0.4

## 2015-10-07 MED ORDER — METHYLPREDNISOLONE SODIUM SUCC 125 MG IJ SOLR
60.0000 mg | INTRAMUSCULAR | Status: DC
  Administered 2015-10-07 – 2015-10-08 (×2): 60 mg via INTRAVENOUS
  Filled 2015-10-07 (×2): qty 2

## 2015-10-07 MED ORDER — PIPERACILLIN-TAZOBACTAM 3.375 G IVPB
3.3750 g | Freq: Three times a day (TID) | INTRAVENOUS | Status: DC
  Administered 2015-10-07 – 2015-10-08 (×3): 3.375 g via INTRAVENOUS
  Filled 2015-10-07 (×5): qty 50

## 2015-10-07 MED ORDER — IPRATROPIUM-ALBUTEROL 0.5-2.5 (3) MG/3ML IN SOLN: 3.0000 mL | RESPIRATORY_TRACT | Status: DC | PRN

## 2015-10-07 MED ORDER — BUPROPION HCL ER (XL) 150 MG PO TB24
150.0000 mg | ORAL_TABLET | Freq: Every day | ORAL | Status: DC
  Administered 2015-10-08 – 2015-10-12 (×5): 150 mg via ORAL
  Filled 2015-10-07 (×5): qty 1

## 2015-10-07 MED ORDER — VANCOMYCIN HCL IN DEXTROSE 1-5 GM/200ML-% IV SOLN: 1000.0000 mg | Freq: Once | INTRAVENOUS | Status: DC

## 2015-10-07 MED ORDER — ONDANSETRON HCL 4 MG/2ML IJ SOLN: 4.0000 mg | Freq: Four times a day (QID) | INTRAMUSCULAR | Status: DC | PRN

## 2015-10-07 MED ORDER — VANCOMYCIN HCL IN DEXTROSE 1-5 GM/200ML-% IV SOLN
1000.0000 mg | Freq: Two times a day (BID) | INTRAVENOUS | Status: DC
  Administered 2015-10-07 – 2015-10-08 (×2): 1000 mg via INTRAVENOUS
  Filled 2015-10-07 (×3): qty 200

## 2015-10-07 MED ORDER — CITALOPRAM HYDROBROMIDE 20 MG PO TABS: 40.0000 mg | ORAL_TABLET | Freq: Every day | ORAL | Status: DC

## 2015-10-07 MED ORDER — ALBUTEROL SULFATE (2.5 MG/3ML) 0.083% IN NEBU
2.5000 mg | INHALATION_SOLUTION | RESPIRATORY_TRACT | Status: DC
  Administered 2015-10-07: 2.5 mg via RESPIRATORY_TRACT
  Filled 2015-10-07: qty 3

## 2015-10-07 MED ORDER — INSULIN ASPART 100 UNIT/ML ~~LOC~~ SOLN
0.0000 [IU] | Freq: Every day | SUBCUTANEOUS | Status: DC
  Administered 2015-10-07: 3 [IU] via SUBCUTANEOUS
  Administered 2015-10-08: 2 [IU] via SUBCUTANEOUS
  Filled 2015-10-07: qty 3
  Filled 2015-10-07 (×2): qty 2

## 2015-10-07 MED ORDER — NALOXONE HCL 2 MG/2ML IJ SOSY
PREFILLED_SYRINGE | INTRAMUSCULAR | Status: AC
  Administered 2015-10-07: 1 mg via INTRAVENOUS
  Filled 2015-10-07: qty 2

## 2015-10-07 MED ORDER — HYDROCHLOROTHIAZIDE 25 MG PO TABS
25.0000 mg | ORAL_TABLET | Freq: Every day | ORAL | Status: DC
  Administered 2015-10-08 – 2015-10-12 (×5): 25 mg via ORAL
  Filled 2015-10-07 (×5): qty 1

## 2015-10-07 MED ORDER — DOCUSATE SODIUM 100 MG PO CAPS
100.0000 mg | ORAL_CAPSULE | Freq: Two times a day (BID) | ORAL | Status: DC
  Administered 2015-10-08 – 2015-10-12 (×7): 100 mg via ORAL
  Filled 2015-10-07 (×8): qty 1

## 2015-10-07 MED ORDER — TIOTROPIUM BROMIDE MONOHYDRATE 18 MCG IN CAPS
18.0000 ug | ORAL_CAPSULE | Freq: Every day | RESPIRATORY_TRACT | Status: DC
  Filled 2015-10-07: qty 5

## 2015-10-07 MED ORDER — FUROSEMIDE 20 MG PO TABS
20.0000 mg | ORAL_TABLET | Freq: Every day | ORAL | Status: DC
  Administered 2015-10-08 – 2015-10-09 (×2): 20 mg via ORAL
  Filled 2015-10-07 (×2): qty 1

## 2015-10-07 MED ORDER — IOHEXOL 350 MG/ML SOLN
100.0000 mL | Freq: Once | INTRAVENOUS | Status: AC | PRN
  Administered 2015-10-07: 100 mL via INTRAVENOUS

## 2015-10-07 MED ORDER — SODIUM CHLORIDE 0.9 % IJ SOLN
3.0000 mL | Freq: Two times a day (BID) | INTRAMUSCULAR | Status: DC
  Administered 2015-10-07 – 2015-10-10 (×7): 3 mL via INTRAVENOUS
  Administered 2015-10-10: 01:00:00 via INTRAVENOUS
  Administered 2015-10-11 – 2015-10-12 (×3): 3 mL via INTRAVENOUS

## 2015-10-07 MED ORDER — PIPERACILLIN-TAZOBACTAM 3.375 G IVPB 30 MIN
3.3750 g | Freq: Once | INTRAVENOUS | Status: AC
  Administered 2015-10-07: 3.375 g via INTRAVENOUS
  Filled 2015-10-07: qty 50

## 2015-10-07 MED ORDER — SODIUM POLYSTYRENE SULFONATE 15 GM/60ML PO SUSP: 15.0000 g | Freq: Once | ORAL | Status: DC

## 2015-10-07 NOTE — H&P (Addendum)
[facility] [place] NAME: Ebony [name]    MR#:  [id number]  DATE OF BIRTH:  Aug 04, 1946  DATE OF ADMISSION:  10/07/2015  PRIMARY CARE PHYSICIAN: Renee Rival, NP   REQUESTING/REFERRING PHYSICIAN:   CHIEF COMPLAINT:   Chief Complaint  Patient presents with  . Respiratory Distress    HISTORY OF PRESENT ILLNESS: Ebony [name]  is a 69 y.o. female with a known history of multiple medical problems including obesity, hypertension, hyperlipidemia, diabetes, hypothyroidism, COPD, obstructive sleep apnea, ongoing tobacco abuse and recent left breast lumpectomy with sentinel node biopsy on 10/06/2015 by Dr. Azalee Course presents to the hospital with complaints of shortness of breath. Unfortunately, no other history is available as patient is on BiPAP and did respiratory distress, but according to emergency room physician. Her oxygen saturations were in 80s when she arrived to emergency room. She was placed on BiPAP. Her labs were checked and they were unremarkable except of elevated white blood cell count and hyperkalemia. Chest x-ray was remarkable for vascular congestion and mild bilateral atelectasis, CT angiogram of the chest revealed postoperative changes in left breast. No PE. Pleural-based nodular density left lower lobe. Hospitalist services were contacted for admission. Patient is not able to review systems. Patient was evaluated by Dr. Azalee Course who felt that left lumpectomy wound looks okay  PAST MEDICAL HISTORY:   Past Medical History  Diagnosis Date  . Hyperlipidemia   . Hypertension   . Diabetes mellitus type 2, uncomplicated ([place])   . Hypothyroidism   . Psoriasis   . Depression   . COPD (chronic obstructive pulmonary disease) ([place])   . Osteoporosis, post-menopausal   . Vitamin D deficiency   . Sleep apnea   . Carpal tunnel syndrome     PAST SURGICAL HISTORY:  Past Surgical History  Procedure Laterality Date  . Tubal  ligation    . Right heel spur removed    . Carpal tunnel Right 1995  . Thumb arthroscopy Left 2000    release of a nerve to the thumb.  . Breast excisional biopsy Left 10/06/2015    np for surgery lumpectomy    SOCIAL HISTORY:  Social History  Substance Use Topics  . Smoking status: Current Every Day Smoker -- 0.50 packs/day for 30 years    Types: Cigarettes  . Smokeless tobacco: Never Used  . Alcohol Use: No    FAMILY HISTORY:  Family History  Problem Relation Age of Onset  . Diabetes Mother   . Thyroid disease Mother   . COPD Father   . Emphysema Father   . Heart disease Brother   . Diabetes Brother   . Heart disease Brother   . Thyroid disease Daughter   . Thyroid disease Son     DRUG ALLERGIES:  Allergies  Allergen Reactions  . Other Hives and Rash    Shellfish. Shellfish Pt reports no allergic reaction to iodine or betadine.    Review of Systems  Unable to perform ROS: critical illness    MEDICATIONS AT HOME:  Prior to Admission medications   Medication Sig Start Date End Date Taking? Authorizing Provider  oxyCODONE-acetaminophen (PERCOCET) 7.5-325 MG tablet Take 1-2 tablets by mouth every 4 (four) hours as needed for severe pain. 10/06/15  Yes Hubbard Robinson, MD  potassium chloride SA (K-DUR,KLOR-CON) 20 MEQ tablet Take 2 tablets (40 mEq total) by mouth 2 (two) times daily. 10/01/15  Yes Hubbard Robinson, MD  Adalimumab (HUMIRA PEN-PSORIASIS STARTER)  40 MG/0.8ML PNKT Inject 40 mg into the skin every 14 (fourteen) days.     Historical Provider, MD  albuterol (PROAIR HFA) 108 (90 BASE) MCG/ACT inhaler INHALE 2 PUFFS BY MOUTH every 4 hours as needed. 09/28/14   Historical Provider, MD  albuterol (PROVENTIL) (2.5 MG/3ML) 0.083% nebulizer solution Inhale 1 vial using nebulizer every six hours as needed    Historical Provider, MD  aspirin EC 81 MG tablet Take by mouth every morning.     Historical Provider, MD  budesonide-formoterol (SYMBICORT) 160-4.5  MCG/ACT inhaler INHALE 2 PUFFS BY MOUTH TWICE DAILY 09/15/14   Historical Provider, MD  buPROPion (WELLBUTRIN XL) 150 MG 24 hr tablet Take by mouth every morning.     Historical Provider, MD  citalopram (CELEXA) 40 MG tablet once daily in am    Historical Provider, MD  furosemide (LASIX) 20 MG tablet 2 (two) times daily.    Historical Provider, MD  glipiZIDE (GLUCOTROL) 5 MG tablet Take 1 tablet by mouth daily before lunch.  08/14/15   Historical Provider, MD  insulin aspart (NOVOLOG) 100 UNIT/ML injection 38-48 units subcutaneously three a day    Historical Provider, MD  insulin detemir (LEVEMIR) 100 UNIT/ML injection Inject 85 units subcutaneously twice a day    Historical Provider, MD  levothyroxine (SYNTHROID, LEVOTHROID) 200 MCG tablet Take by mouth daily before breakfast.     Historical Provider, MD  levothyroxine (SYNTHROID, LEVOTHROID) 50 MCG tablet Take by mouth daily before breakfast.     Historical Provider, MD  losartan-hydrochlorothiazide (HYZAAR) 100-25 MG tablet Take by mouth. In am.    Historical Provider, MD  metFORMIN (GLUCOPHAGE-XR) 500 MG 24 hr tablet Take 500 mg by mouth 2 (two) times daily.     Historical Provider, MD  simvastatin (ZOCOR) 40 MG tablet Take by mouth at bedtime.     Historical Provider, MD  tiotropium (SPIRIVA HANDIHALER) 18 MCG inhalation capsule INHALE 1 CAPSULE AS DIRECTED ONCE DAILY in am. 08/24/14   Historical Provider, MD  vitamin E 400 UNIT capsule Take by mouth every morning.     Historical Provider, MD      PHYSICAL EXAMINATION:   VITAL SIGNS: Blood pressure 109/51, pulse 79, resp. rate 20, SpO2 98 %.  GENERAL:  69 y.o.-year-old patient lying in the bed in moderate to severe respiratory distress. Using oxygen through BiPAP, tachypneic, although somewhat somnolent  EYES: Pupils equal, round, reactive to light and accommodation. No scleral icterus. Extraocular muscles intact.  HEENT: Head atraumatic, normocephalic. Oropharynx and nasopharynx clear.   NECK:  Supple, no jugular venous distention. No thyroid enlargement, no tenderness.  LUNGS: Diminished breath sounds bilaterally, no wheezing, rales,rhonchi or crepitation. Using accessory muscles of respiration, on BiPAP in the moderate respiratory distress. Left breast is dressed. No bleeding, drainage was noted CARDIOVASCULAR: S1, S2 normal. No murmurs, rubs, or gallops.  ABDOMEN: Soft, nontender, nondistended. Bowel sounds present. No organomegaly or mass.  EXTREMITIES: No pedal edema, cyanosis, or clubbing.  NEUROLOGIC: Cranial nerves II through XII are intact. Muscle strength 5/5 in all extremities. Sensation intact. Gait not checked.  PSYCHIATRIC: The patient is somewhat somnolent but awakens easily , not able to assess orientation.  SKIN: No obvious rash, lesion, or ulcer.   LABORATORY PANEL:   CBC  Recent Labs Lab 10/01/15 1219 10/07/15 0606  WBC 11.9* 13.0*  HGB 14.5 12.8  HCT 44.6 40.6  PLT 303 277  MCV 93.1 95.1  MCH 30.3 30.0  MCHC 32.5 31.5*  RDW 13.3 13.6  LYMPHSABS 2.7  --  MONOABS 0.7  --   EOSABS 0.1  --   BASOSABS 0.1  --    ------------------------------------------------------------------------------------------------------------------  Chemistries   Recent Labs Lab 10/07/15 0606  NA 138  K 5.6*  CL 103  CO2 29  GLUCOSE 237*  BUN 11  CREATININE 0.67  CALCIUM 8.9   ------------------------------------------------------------------------------------------------------------------  Cardiac Enzymes No results for input(s): TROPONINI in the last 168 hours. ------------------------------------------------------------------------------------------------------------------  RADIOLOGY: Dg Chest 1 View  10/07/2015  CLINICAL DATA:  Acute onset of decreased O2 saturation, after accidental overdose. Initial encounter. EXAM: CHEST 1 VIEW COMPARISON:  Chest radiograph performed 10/01/2015 FINDINGS: The lungs are well-aerated. Vascular congestion is noted,  with mild bilateral atelectasis. There is no evidence of pleural effusion or pneumothorax. The cardiomediastinal silhouette is borderline enlarged. No acute osseous abnormalities are seen. IMPRESSION: Vascular congestion and borderline cardiomegaly, with mild bilateral atelectasis. Electronically Signed   By: Garald Balding M.D.   On: 10/07/2015 06:31   Ct Angio Chest Pe W/cm &/or Wo Cm  10/07/2015  CLINICAL DATA:  Chest pain and shortness of Breath, history of recent mastectomy EXAM: CT ANGIOGRAPHY CHEST WITH CONTRAST TECHNIQUE: Multidetector CT imaging of the chest was performed using the standard protocol during bolus administration of intravenous contrast. Multiplanar CT image reconstructions and MIPs were obtained to evaluate the vascular anatomy. CONTRAST:  141m OMNIPAQUE IOHEXOL 350 MG/ML SOLN COMPARISON:  11/19/2014 FINDINGS: Lungs are well aerated bilaterally. No focal infiltrate or sizable effusion is noted. Changes of prior granulomatous disease are again noted. Previously seen nodular densities have improved in the interval likely postinflammatory in nature. Small area pleural based density is noted in the left lower lobe best seen on image number 89 of series 7. This measures approximately 10 mm in greatest dimension. It is likely postinflammatory in nature as it was not present on the prior exam. Followup examination is recommended. The thoracic inlet is within normal limits. The thoracic aorta shows no evidence of aneurysmal dilatation or dissection. The pulmonary artery is well opacified without evidence of pulmonary embolism. Postsurgical changes are noted in the left breast. The upper abdomen is within normal limits with the exception of an enlarged left adrenal gland stable from the prior study. The osseous structures show no acute abnormality. Review of the MIP images confirms the above findings. IMPRESSION: Postoperative changes in the left breast. No evidence of pulmonary emboli. Prior  granulomatous disease. Pleural-based nodular appearing density in the left lower lobe as described. Given some changes in the interval from the previous exam this is likely postinflammatory in nature. If the patient is at high risk for bronchogenic carcinoma, follow-up chest CT at 3-626monthis recommended. If the patient is at low risk for bronchogenic carcinoma, follow-up chest CT at 6-12 months is recommended. This recommendation follows the consensus statement: Guidelines for Management of Small Pulmonary Nodules Detected on CT Scans: A Statement from the [employer] published in Radiology 2005; 237:395-400. Electronically Signed   By: MaInez Catalina.D.   On: 10/07/2015 08:06   Nm Sentinel Node Inj-no Rpt (breast)  10/06/2015  CLINICAL DATA: Breast Cancer, Left Sulfur colloid was injected intradermally by the nuclear medicine technologist for breast cancer sentinel node localization.   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 EXAM: SPECIMEN RADIOGRAPH OF THE left BREAST COMPARISON:  Previous exam(s). FINDINGS: Status post excision of the left breast. The wire tip and  biopsy marker clip/mass are present and are marked for pathology. OR notified. IMPRESSION: Specimen radiograph of the left breast. Electronically Signed   By: Marin Olp M.D.   On: 10/06/2015 12:12   Mm Lt Plc Breast Loc Dev   1st Lesion  Inc Mammo Guide  10/06/2015  CLINICAL DATA:  Patient presents for needle localization prior to surgical excision of a biopsy-proven malignancy over the inner upper left breast. EXAM: NEEDLE LOCALIZATION OF THE LEFT BREAST WITH MAMMO GUIDANCE COMPARISON:  Previous exams. FINDINGS: Patient presents for needle localization prior to surgical excision. I met with the patient and we discussed the procedure of needle localization including benefits and  alternatives. We discussed the high likelihood of a successful procedure. We discussed the risks of the procedure, including infection, bleeding, tissue injury, and further surgery. Informed, written consent was given. The usual time-out protocol was performed immediately prior to the procedure. Using mammographic guidance, sterile technique, 2% lidocaine and a 7 cm modified Kopans needle, the targeted mass/clip was localized using superior to inferior approach. The wire passes immediately posterior medial to the targeted clip as the mass/ clip lie adjacent to the middle of the reinforced segment of the wire. The images were marked for surgeon. IMPRESSION: Needle localization left breast. No apparent complications. Electronically Signed   By: Marin Olp M.D.   On: 10/06/2015 09:15    EKG: Orders placed or performed during the hospital encounter of 10/07/15  . ED EKG within 10 minutes  . ED EKG within 10 minutes   EKG showed a sinus tachycardia at 100 bpm, normal axis, low voltage QRS. No acute ST-T changes  IMPRESSION AND PLAN:  Principal Problem:   Acute respiratory failure with hypoxia (HCC) Active Problems:   COPD exacerbation (HCC)   Community acquired pneumonia   Hyperkalemia 1. Acute respiratory failure with hypoxia, likely COPD related, no pulmonary embolus on CT angiogram, although possible pneumonia was noted in left lower lobe, concerning for healthcare associated pneumonia, initiate patient on vancomycin, Zosyn as well as steroids, inhalation therapy and continue BiPAP. Pulmonary consultation 2. COPD exacerbation, initiate steroids, inhalation therapy, nebulizers, following clinically 3. Healthcare associated pneumonia, initiate vancomycin, Zosyn as well as Zithromax intravenously. Get sputum cultures if possible 4. Hyperkalemia. Give one dose of Kayexalate. Continue IV fluids, discontinue potassium supplements, follow potassium level later today 5. Leukocytosis, likely due to  pneumonia versus stress, follow with therapy   All the records are reviewed and case discussed with ED provider. Management plans discussed with the patient, patient's husband and they are in agreement.  CODE STATUS:  Full code  TOTAL CRITICAL CARE TIME TAKING CARE OF THIS PATIENT: 50 minutes.    Theodoro Grist M.D on 10/07/2015 at 9:07 AM  Between 7am to 6pm - Pager - [phone] After 6pm go to [email or web address] - password [email or web address] [facility]  Office  [phone]  CC: Primary care physician; Renee Rival, NP

## 2015-10-07 NOTE — ED Notes (Signed)
Per husband, pt had a left sided mastectomy yesterday.Pt was "on the fence" per husband of being discharged yesterday after surgery because of respiratory issues from COPD, but pt wanted to go home yesterday.   Pt wanted to go and not stay 1 night, so patient was discharged. Per husband, pt and husband were told to wake pt up q4h to take 2 percocets. At 11p yesterday, husband had trouble waking pt up. Pt slid out of bed. Husband called EMS. EMS put pt back in bed. At 0300 this morning, husband had more trouble waking pt up. Husband called EMS again.  EMS and husband "had some words" over waking the pt up to give her 2 percocets. Husband called EMT friend and pt was transported to [facility] ED. Pt has hx of COPD.

## 2015-10-07 NOTE — Progress Notes (Signed)
EICU called regarding slow improvement with improving LOC.  ABG repeated and is improving.

## 2015-10-07 NOTE — ED Notes (Signed)
Bipap initiated by respiratory.

## 2015-10-07 NOTE — Progress Notes (Signed)
Pharmacy Antibiotic Time-Out Note  Ebony [name] is a 69 y.o. year-old female admitted on 10/07/2015.  The patient is currently on Azithromycin, Zosyn, and vancomycin for HCAP.  Assessment/Plan: Recommended D/C of azithromycin. MD approved.   Temp (24hrs), Avg:97.9 F (36.6 C), Min:97.5 F (36.4 C), Max:98.9 F (37.2 C)   Recent Labs Lab 10/01/15 1219 10/07/15 0606  WBC 11.9* 13.0*    Recent Labs Lab 10/01/15 1219 10/07/15 0606  CREATININE 0.66 0.67   Estimated Creatinine Clearance: 79.3 mL/min (by C-G formula based on Cr of 0.67).    Antimicrobials this admission: 11/24>> Azitrhomycin 11/24 >> Zosyn 11/24 >> Vancomycin  Thank you for allowing pharmacy to be a part of this patient's care.  Pernell Dupre PharmD 10/07/2015 1:46 PM

## 2015-10-07 NOTE — Progress Notes (Signed)
EICU called regarding pt inability to take meds due to lethargy and possibility of aspiration.  CBG = 283 and sliding scale insulin was ordered. CBG's ordered ac/hs.

## 2015-10-07 NOTE — Anesthesia Postprocedure Evaluation (Signed)
Anesthesia Post Note  Patient: Ebony [name]  Procedure(s) Performed: Procedure(s) (LRB): BREAST LUMPECTOMY WITH NEEDLE LOCALIZATION (Left) SENTINEL NODE BIOPSY (Left)  Patient location during evaluation: PACU Anesthesia Type: General Level of consciousness: awake and alert Pain management: pain level controlled Vital Signs Assessment: post-procedure vital signs reviewed and stable Respiratory status: spontaneous breathing, nonlabored ventilation, respiratory function stable and patient connected to nasal cannula oxygen Cardiovascular status: blood pressure returned to baseline and stable Postop Assessment: No signs of nausea or vomiting Anesthetic complications: no    Last Vitals:  Filed Vitals:   10/06/15 1439 10/06/15 1547  BP: 136/55 142/85  Pulse: 92 92  Temp: 36.4 C 36.4 C  Resp: 25 25    Last Pain:  Filed Vitals:   10/06/15 1548  PainSc: 4                  Martha Clan

## 2015-10-07 NOTE — ED Notes (Signed)
Pt grimaces and groans when IV is inserted. Pt is belly breathing and mouth breathing at this time.

## 2015-10-07 NOTE — ED Provider Notes (Signed)
[facility] Emergency Department Provider Note  ____________________________________________  Time seen: 6:00 AM  I have reviewed the triage vital signs and the nursing notes.  History of physical exam limited secondary to respiratory distress HISTORY  Chief Complaint Respiratory Distress      HPI Ebony [name] is a 69 y.o. female presents with respiratory distress via EMS. Per EMS patient had a mastectomy performed yesterday. On their arrival patient suction level was low 80s. Patient  presents in apparent respiratory distress.     Past Medical History  Diagnosis Date  . Hyperlipidemia   . Hypertension   . Diabetes mellitus type 2, uncomplicated ([place])   . Hypothyroidism   . Psoriasis   . Depression   . COPD (chronic obstructive pulmonary disease) ([place])   . Osteoporosis, post-menopausal   . Vitamin D deficiency   . Sleep apnea   . Carpal tunnel syndrome     Patient Active Problem List   Diagnosis Date Noted  . Breast cancer, stage 1 ([place])   . Cancer of left female breast ([place]) 09/09/2015  . Mild chronic obstructive pulmonary disease ([place]) 06/25/2014  . Inflamed nasal mucosa 06/25/2014  . Apnea, sleep 06/25/2014    Past Surgical History  Procedure Laterality Date  . Tubal ligation    . Right heel spur removed    . Carpal tunnel Right 1995  . Thumb arthroscopy Left 2000    release of a nerve to the thumb.  . Breast excisional biopsy Left 10/06/2015    np for surgery lumpectomy    Current Outpatient Rx  Name  Route  Sig  Dispense  Refill  . oxyCODONE-acetaminophen (PERCOCET) 7.5-325 MG tablet   Oral   Take 1-2 tablets by mouth every 4 (four) hours as needed for severe pain.   30 tablet   0   . potassium chloride SA (K-DUR,KLOR-CON) 20 MEQ tablet   Oral   Take 2 tablets (40 mEq total) by mouth 2 (two) times daily.   20 tablet   0   . Adalimumab (HUMIRA PEN-PSORIASIS STARTER) 40 MG/0.8ML PNKT   Subcutaneous   Inject 40 mg  into the skin every 14 (fourteen) days.          Marland Kitchen albuterol (PROAIR HFA) 108 (90 BASE) MCG/ACT inhaler      INHALE 2 PUFFS BY MOUTH every 4 hours as needed.         Marland Kitchen albuterol (PROVENTIL) (2.5 MG/3ML) 0.083% nebulizer solution      Inhale 1 vial using nebulizer every six hours as needed         . aspirin EC 81 MG tablet   Oral   Take by mouth every morning.          . budesonide-formoterol (SYMBICORT) 160-4.5 MCG/ACT inhaler      INHALE 2 PUFFS BY MOUTH TWICE DAILY         . buPROPion (WELLBUTRIN XL) 150 MG 24 hr tablet   Oral   Take by mouth every morning.          . citalopram (CELEXA) 40 MG tablet      once daily in am         . furosemide (LASIX) 20 MG tablet      2 (two) times daily.         Marland Kitchen glipiZIDE (GLUCOTROL) 5 MG tablet   Oral   Take 1 tablet by mouth daily before lunch.          Marland Kitchen  insulin aspart (NOVOLOG) 100 UNIT/ML injection      38-48 units subcutaneously three a day         . insulin detemir (LEVEMIR) 100 UNIT/ML injection      Inject 85 units subcutaneously twice a day         . levothyroxine (SYNTHROID, LEVOTHROID) 200 MCG tablet   Oral   Take by mouth daily before breakfast.          . levothyroxine (SYNTHROID, LEVOTHROID) 50 MCG tablet   Oral   Take by mouth daily before breakfast.          . losartan-hydrochlorothiazide (HYZAAR) 100-25 MG tablet   Oral   Take by mouth. In am.         . metFORMIN (GLUCOPHAGE-XR) 500 MG 24 hr tablet   Oral   Take 500 mg by mouth 2 (two) times daily.          . simvastatin (ZOCOR) 40 MG tablet   Oral   Take by mouth at bedtime.          Marland Kitchen tiotropium (SPIRIVA HANDIHALER) 18 MCG inhalation capsule      INHALE 1 CAPSULE AS DIRECTED ONCE DAILY in am.         . vitamin E 400 UNIT capsule   Oral   Take by mouth every morning.            Allergies Other  Family History  Problem Relation Age of Onset  . Diabetes Mother   . Thyroid disease Mother   . COPD  Father   . Emphysema Father   . Heart disease Brother   . Diabetes Brother   . Heart disease Brother   . Thyroid disease Daughter   . Thyroid disease Son     Social History Social History  Substance Use Topics  . Smoking status: Current Every Day Smoker -- 0.50 packs/day for 30 years    Types: Cigarettes  . Smokeless tobacco: Never Used  . Alcohol Use: No    Review of Systems  Constitutional: Negative for fever. Eyes: Negative for visual changes. ENT: Negative for sore throat. Cardiovascular: Negative for chest pain. Respiratory: Positive for dyspnea Gastrointestinal: Negative for abdominal pain, vomiting and diarrhea. Genitourinary: Negative for dysuria. Musculoskeletal: Negative for back pain. Skin: Negative for rash. Neurological: Negative for headaches, focal weakness or numbness.   10-point ROS otherwise negative.  ____________________________________________   PHYSICAL EXAM:  VITAL SIGNS: ED Triage Vitals  Enc Vitals Group     BP 10/07/15 0605 144/117 mmHg     Pulse Rate 10/07/15 0605 89     Resp 10/07/15 0605 17     Temp --      Temp src --      SpO2 10/07/15 0554 85 %     Weight --      Height --      Head Cir --      Peak Flow --      Pain Score 10/07/15 0611 0     Pain Loc --      Pain Edu? --      Excl. in [place]? --      Constitutional: Alert and oriented. Apparent respiratory distress Eyes: Conjunctivae are normal. PERRL. Normal extraocular movements. ENT   Head: Normocephalic and atraumatic.   Nose: No congestion/rhinnorhea.   Mouth/Throat: Mucous membranes are moist.   Neck: No stridor. Hematological/Lymphatic/Immunilogical: No cervical lymphadenopathy. Cardiovascular: Normal rate, regular rhythm. Normal and symmetric distal pulses are present  in all extremities. No murmurs, rubs, or gallops. Respiratory: Tachypnea, accessory respiratory muscle use bibasilar rhonchi  Gastrointestinal: Soft and nontender. No distention. There  is no CVA tenderness. Genitourinary: deferred Musculoskeletal: Nontender with normal range of motion in all extremities. No joint effusions.  No lower extremity tenderness nor edema. Neurologic:  Normal speech and language. No gross focal neurologic deficits are appreciated. Speech is normal.  Skin:  Skin is warm, dry and intact. No rash noted. Psychiatric: Mood and affect are normal. Speech and behavior are normal. Patient exhibits appropriate insight and judgment.  ____________________________________________    LABS (pertinent positives/negatives)  Labs Reviewed  BASIC METABOLIC PANEL - Abnormal; Notable for the following:    Potassium 5.6 (*)    Glucose, Bld 237 (*)    All other components within normal limits  CBC - Abnormal; Notable for the following:    WBC 13.0 (*)    MCHC 31.5 (*)    All other components within normal limits  BLOOD GAS, ARTERIAL - Abnormal; Notable for the following:    pH, Arterial 7.22 (*)    pCO2 arterial 74 (*)    pO2, Arterial 121 (*)    Bicarbonate 30.3 (*)    Allens test (pass/fail) POSITIVE (*)    All other components within normal limits  BLOOD GAS, ARTERIAL - Abnormal; Notable for the following:    pH, Arterial 7.26 (*)    pCO2 arterial 69 (*)    Bicarbonate 31.0 (*)    Allens test (pass/fail) POSITIVE (*)    All other components within normal limits  HEMOGLOBIN A1C - Abnormal; Notable for the following:    Hgb A1c MFr Bld 8.0 (*)    All other components within normal limits  BLOOD GAS, ARTERIAL - Abnormal; Notable for the following:    pH, Arterial 7.33 (*)    pCO2 arterial 60 (*)    Bicarbonate 31.6 (*)    Acid-Base Excess 4.0 (*)    Allens test (pass/fail) POSITIVE (*)    All other components within normal limits  GLUCOSE, CAPILLARY - Abnormal; Notable for the following:    Glucose-Capillary 283 (*)    All other components within normal limits  GLUCOSE, CAPILLARY - Abnormal; Notable for the following:    Glucose-Capillary 261 (*)     All other components within normal limits  CULTURE, EXPECTORATED SPUTUM-ASSESSMENT  POTASSIUM  TROPONIN I  TROPONIN I  TROPONIN I  BASIC METABOLIC PANEL  CBC      RADIOLOGY CT Angio Chest PE W/Cm &/Or Wo Cm (Final result) Result time: 10/07/15 08:06:25   Final result by Rad Results In Interface (10/07/15 08:06:25)   Narrative:   CLINICAL DATA: Chest pain and shortness of Breath, history of recent mastectomy  EXAM: CT ANGIOGRAPHY CHEST WITH CONTRAST  TECHNIQUE: Multidetector CT imaging of the chest was performed using the standard protocol during bolus administration of intravenous contrast. Multiplanar CT image reconstructions and MIPs were obtained to evaluate the vascular anatomy.  CONTRAST: 111mL OMNIPAQUE IOHEXOL 350 MG/ML SOLN  COMPARISON: 11/19/2014  FINDINGS: Lungs are well aerated bilaterally. No focal infiltrate or sizable effusion is noted. Changes of prior granulomatous disease are again noted. Previously seen nodular densities have improved in the interval likely postinflammatory in nature. Small area pleural based density is noted in the left lower lobe best seen on image number 89 of series 7. This measures approximately 10 mm in greatest dimension. It is likely postinflammatory in nature as it was not present on the prior exam. Followup  examination is recommended.  The thoracic inlet is within normal limits. The thoracic aorta shows no evidence of aneurysmal dilatation or dissection. The pulmonary artery is well opacified without evidence of pulmonary embolism.  Postsurgical changes are noted in the left breast. The upper abdomen is within normal limits with the exception of an enlarged left adrenal gland stable from the prior study. The osseous structures show no acute abnormality.  Review of the MIP images confirms the above findings.  IMPRESSION: Postoperative changes in the left breast.  No evidence of pulmonary emboli.  Prior  granulomatous disease.  Pleural-based nodular appearing density in the left lower lobe as described. Given some changes in the interval from the previous exam this is likely postinflammatory in nature. If the patient is at high risk for bronchogenic carcinoma, follow-up chest CT at 3-47months is recommended. If the patient is at low risk for bronchogenic carcinoma, follow-up chest CT at 6-12 months is recommended. This recommendation follows the consensus statement: Guidelines for Management of Small Pulmonary Nodules Detected on CT Scans: A Statement from the [employer] as published in Radiology 2005; 237:395-400.   Electronically Signed By: Inez Catalina M.D. On: 10/07/2015 08:06          DG Chest 1 View (Final result) Result time: 10/07/15 06:31:15   Final result by Rad Results In Interface (10/07/15 06:31:15)   Narrative:   CLINICAL DATA: Acute onset of decreased O2 saturation, after accidental overdose. Initial encounter.  EXAM: CHEST 1 VIEW  COMPARISON: Chest radiograph performed 10/01/2015  FINDINGS: The lungs are well-aerated. Vascular congestion is noted, with mild bilateral atelectasis. There is no evidence of pleural effusion or pneumothorax.  The cardiomediastinal silhouette is borderline enlarged. No acute osseous abnormalities are seen.  IMPRESSION: Vascular congestion and borderline cardiomegaly, with mild bilateral atelectasis.   Electronically Signed By: Garald Balding M.D. On: 10/07/2015 06:31      Critical Care performed: CRITICAL CARE Performed by: Marjean Donna N   Total critical care time:  26minutes  Critical care time was exclusive of separately billable procedures and treating other patients.  Critical care was necessary to treat or prevent imminent or life-threatening deterioration.  Critical care was time spent personally by me on the following activities: development of treatment plan with patient and/or  surrogate as well as nursing, discussions with consultants, evaluation of patient's response to treatment, examination of patient, obtaining history from patient or surrogate, ordering and performing treatments and interventions, ordering and review of laboratory studies, ordering and review of radiographic studies, pulse oximetry and re-evaluation of patient's condition.   ____________________________________________   INITIAL IMPRESSION / ASSESSMENT AND PLAN / ED COURSE  Pertinent labs & imaging results that were available during my care of the patient were reviewed by me and considered in my medical decision making (see chart for details).  Patient presented rest or distress as such BiPAP applied with improvement of symptoms ____________________________________________   FINAL CLINICAL IMPRESSION(S) / ED DIAGNOSES  Final diagnoses:  COPD exacerbation ([place])  Acute respiratory failure with hypoxia ([place])      Gregor Hams, MD 10/08/15 [phone]

## 2015-10-07 NOTE — H&P (Signed)
Ebony [name] is an 69 y.o. female.   Chief Complaint: SOB HPI: 69 yr old female with left breast cancer, POD#1 from Left breast lumpectomy and sentinel lymph node biopsy.  Patient did well yesterday with the operation and left the hospital.  Husband at bedside and reported that he and daughter had a hard time getting her into the house yesterday, that she was confused.  He stated that she continued to become confused and he was giving her two percocets for pain as well, because he didn't know if she could ask for them.  He states that she went to bed with CPAP on last night and he woke her up and 11pm and 3AM and gave her two percocets each, but stated it was hard to wake her up.  She then continued to have worsening breathing, confusion and lethargic state and he could not get her up from her bed and called the ambulance.  Patient has CPAP lethargic, unable to answered questions  Past Medical History  Diagnosis Date  . Hyperlipidemia   . Hypertension   . Diabetes mellitus type 2, uncomplicated ([place])   . Hypothyroidism   . Psoriasis   . Depression   . COPD (chronic obstructive pulmonary disease) ([place])   . Osteoporosis, post-menopausal   . Vitamin D deficiency   . Sleep apnea   . Carpal tunnel syndrome     Past Surgical History  Procedure Laterality Date  . Tubal ligation    . Right heel spur removed    . Carpal tunnel Right 1995  . Thumb arthroscopy Left 2000    release of a nerve to the thumb.  . Breast excisional biopsy Left 10/06/2015    np for surgery lumpectomy    Family History  Problem Relation Age of Onset  . Diabetes Mother   . Thyroid disease Mother   . COPD Father   . Emphysema Father   . Heart disease Brother   . Diabetes Brother   . Heart disease Brother   . Thyroid disease Daughter   . Thyroid disease Son    Social History:  reports that she has been smoking Cigarettes.  She has a 15 pack-year smoking history. She has never used smokeless tobacco. She  reports that she does not drink alcohol or use illicit drugs.  Allergies:  Allergies  Allergen Reactions  . Other Hives and Rash    Shellfish. Shellfish Pt reports no allergic reaction to iodine or betadine.    Medications Prior to Admission  Medication Sig Dispense Refill  . oxyCODONE-acetaminophen (PERCOCET) 7.5-325 MG tablet Take 1-2 tablets by mouth every 4 (four) hours as needed for severe pain. 30 tablet 0  . potassium chloride SA (K-DUR,KLOR-CON) 20 MEQ tablet Take 2 tablets (40 mEq total) by mouth 2 (two) times daily. 20 tablet 0  . Adalimumab (HUMIRA PEN-PSORIASIS STARTER) 40 MG/0.8ML PNKT Inject 40 mg into the skin every 14 (fourteen) days.     Marland Kitchen albuterol (PROAIR HFA) 108 (90 BASE) MCG/ACT inhaler INHALE 2 PUFFS BY MOUTH every 4 hours as needed.    Marland Kitchen albuterol (PROVENTIL) (2.5 MG/3ML) 0.083% nebulizer solution Inhale 1 vial using nebulizer every six hours as needed    . aspirin EC 81 MG tablet Take by mouth every morning.     . budesonide-formoterol (SYMBICORT) 160-4.5 MCG/ACT inhaler INHALE 2 PUFFS BY MOUTH TWICE DAILY    . buPROPion (WELLBUTRIN XL) 150 MG 24 hr tablet Take by mouth every morning.     Marland Kitchen  citalopram (CELEXA) 40 MG tablet once daily in am    . furosemide (LASIX) 20 MG tablet 2 (two) times daily.    Marland Kitchen glipiZIDE (GLUCOTROL) 5 MG tablet Take 1 tablet by mouth daily before lunch.     . insulin aspart (NOVOLOG) 100 UNIT/ML injection 38-48 units subcutaneously three a day    . insulin detemir (LEVEMIR) 100 UNIT/ML injection Inject 85 units subcutaneously twice a day    . levothyroxine (SYNTHROID, LEVOTHROID) 200 MCG tablet Take by mouth daily before breakfast.     . levothyroxine (SYNTHROID, LEVOTHROID) 50 MCG tablet Take by mouth daily before breakfast.     . losartan-hydrochlorothiazide (HYZAAR) 100-25 MG tablet Take by mouth. In am.    . metFORMIN (GLUCOPHAGE-XR) 500 MG 24 hr tablet Take 500 mg by mouth 2 (two) times daily.     . simvastatin (ZOCOR) 40 MG tablet  Take by mouth at bedtime.     Marland Kitchen tiotropium (SPIRIVA HANDIHALER) 18 MCG inhalation capsule INHALE 1 CAPSULE AS DIRECTED ONCE DAILY in am.    . vitamin E 400 UNIT capsule Take by mouth every morning.       Results for orders placed or performed during the hospital encounter of 10/07/15 (from the past 48 hour(s))  Basic metabolic panel     Status: Abnormal   Collection Time: 10/07/15  6:06 AM  Result Value Ref Range   Sodium 138 135 - 145 mmol/L   Potassium 5.6 (H) 3.5 - 5.1 mmol/L   Chloride 103 101 - 111 mmol/L   CO2 29 22 - 32 mmol/L   Glucose, Bld 237 (H) 65 - 99 mg/dL   BUN 11 6 - 20 mg/dL   Creatinine, Ser 0.67 0.44 - 1.00 mg/dL   Calcium 8.9 8.9 - 10.3 mg/dL   GFR calc non Af Amer >60 >60 mL/min   GFR calc Af Amer >60 >60 mL/min    Comment: (NOTE) The eGFR has been calculated using the CKD EPI equation. This calculation has not been validated in all clinical situations. eGFR's persistently <60 mL/min signify possible Chronic Kidney Disease.    Anion gap 6 5 - 15  CBC     Status: Abnormal   Collection Time: 10/07/15  6:06 AM  Result Value Ref Range   WBC 13.0 (H) 3.6 - 11.0 K/uL   RBC 4.27 3.80 - 5.20 MIL/uL   Hemoglobin 12.8 12.0 - 16.0 g/dL   HCT 40.6 35.0 - 47.0 %   MCV 95.1 80.0 - 100.0 fL   MCH 30.0 26.0 - 34.0 pg   MCHC 31.5 (L) 32.0 - 36.0 g/dL   RDW 13.6 11.5 - 14.5 %   Platelets 277 150 - 440 K/uL  Blood gas, arterial     Status: Abnormal   Collection Time: 10/07/15  9:32 AM  Result Value Ref Range   FIO2 0.50    Delivery systems BILEVEL POSITIVE AIRWAY PRESSURE    LHR 8 resp/min   Inspiratory PAP 12    Expiratory PAP 6    pH, Arterial 7.22 (L) 7.350 - 7.450   pCO2 arterial 74 (HH) 32.0 - 48.0 mmHg    Comment: CRITICAL RESULT CALLED TO, READ BACK BY AND VERIFIED WITH: CRITICAL VALUE RBV TO DR.VAIKUTE, R AT 0939 ON [id number] BY GKM    pO2, Arterial 121 (H) 83.0 - 108.0 mmHg   Bicarbonate 30.3 (H) 21.0 - 28.0 mEq/L   Acid-Base Excess 0.5 0.0 - 3.0 mmol/L    O2 Saturation 98.0 %  Patient temperature 37.0    Collection site RIGHT RADIAL    Sample type ARTERIAL DRAW    Allens test (pass/fail) POSITIVE (A) PASS  Hemoglobin A1c     Status: Abnormal   Collection Time: 10/07/15 11:41 AM  Result Value Ref Range   Hgb A1c MFr Bld 8.0 (H) 4.0 - 6.0 %  Troponin I     Status: None   Collection Time: 10/07/15 11:41 AM  Result Value Ref Range   Troponin I <0.03 <0.031 ng/mL    Comment:        NO INDICATION OF MYOCARDIAL INJURY.   Blood gas, arterial     Status: Abnormal   Collection Time: 10/07/15 11:55 AM  Result Value Ref Range   FIO2 0.40    Delivery systems BILEVEL POSITIVE AIRWAY PRESSURE    Inspiratory PAP 16    Expiratory PAP 6    pH, Arterial 7.26 (L) 7.350 - 7.450   pCO2 arterial 69 (H) 32.0 - 48.0 mmHg   pO2, Arterial 92 83.0 - 108.0 mmHg   Bicarbonate 31.0 (H) 21.0 - 28.0 mEq/L   Acid-Base Excess 1.9 0.0 - 3.0 mmol/L   O2 Saturation 95.8 %   Patient temperature 37.0    Collection site RIGHT RADIAL    Sample type ARTERIAL DRAW    Allens test (pass/fail) POSITIVE (A) PASS   Mechanical Rate 8   Potassium     Status: None   Collection Time: 10/07/15  5:18 PM  Result Value Ref Range   Potassium 4.9 3.5 - 5.1 mmol/L  Troponin I     Status: None   Collection Time: 10/07/15  5:18 PM  Result Value Ref Range   Troponin I <0.03 <0.031 ng/mL    Comment:        NO INDICATION OF MYOCARDIAL INJURY.   Blood gas, arterial     Status: Abnormal   Collection Time: 10/07/15  6:00 PM  Result Value Ref Range   FIO2 40.00    Delivery systems BILEVEL POSITIVE AIRWAY PRESSURE    Inspiratory PAP 16    Expiratory PAP 6    pH, Arterial 7.33 (L) 7.350 - 7.450   pCO2 arterial 60 (H) 32.0 - 48.0 mmHg   pO2, Arterial 94 83.0 - 108.0 mmHg   Bicarbonate 31.6 (H) 21.0 - 28.0 mEq/L   Acid-Base Excess 4.0 (H) 0.0 - 3.0 mmol/L   O2 Saturation 96.8 %   Patient temperature 37.0    Collection site RIGHT RADIAL    Sample type ARTERIAL DRAW     Allens test (pass/fail) POSITIVE (A) PASS   Dg Chest 1 View  10/07/2015  CLINICAL DATA:  Acute onset of decreased O2 saturation, after accidental overdose. Initial encounter. EXAM: CHEST 1 VIEW COMPARISON:  Chest radiograph performed 10/01/2015 FINDINGS: The lungs are well-aerated. Vascular congestion is noted, with mild bilateral atelectasis. There is no evidence of pleural effusion or pneumothorax. The cardiomediastinal silhouette is borderline enlarged. No acute osseous abnormalities are seen. IMPRESSION: Vascular congestion and borderline cardiomegaly, with mild bilateral atelectasis. Electronically Signed   By: Garald Balding M.D.   On: 10/07/2015 06:31   Ct Angio Chest Pe W/cm &/or Wo Cm  10/07/2015  CLINICAL DATA:  Chest pain and shortness of Breath, history of recent mastectomy EXAM: CT ANGIOGRAPHY CHEST WITH CONTRAST TECHNIQUE: Multidetector CT imaging of the chest was performed using the standard protocol during bolus administration of intravenous contrast. Multiplanar CT image reconstructions and MIPs were obtained to evaluate the vascular anatomy. CONTRAST:  158m OMNIPAQUE IOHEXOL 350 MG/ML SOLN COMPARISON:  11/19/2014 FINDINGS: Lungs are well aerated bilaterally. No focal infiltrate or sizable effusion is noted. Changes of prior granulomatous disease are again noted. Previously seen nodular densities have improved in the interval likely postinflammatory in nature. Small area pleural based density is noted in the left lower lobe best seen on image number 89 of series 7. This measures approximately 10 mm in greatest dimension. It is likely postinflammatory in nature as it was not present on the prior exam. Followup examination is recommended. The thoracic inlet is within normal limits. The thoracic aorta shows no evidence of aneurysmal dilatation or dissection. The pulmonary artery is well opacified without evidence of pulmonary embolism. Postsurgical changes are noted in the left breast. The  upper abdomen is within normal limits with the exception of an enlarged left adrenal gland stable from the prior study. The osseous structures show no acute abnormality. Review of the MIP images confirms the above findings. IMPRESSION: Postoperative changes in the left breast. No evidence of pulmonary emboli. Prior granulomatous disease. Pleural-based nodular appearing density in the left lower lobe as described. Given some changes in the interval from the previous exam this is likely postinflammatory in nature. If the patient is at high risk for bronchogenic carcinoma, follow-up chest CT at 3-656monthis recommended. If the patient is at low risk for bronchogenic carcinoma, follow-up chest CT at 6-12 months is recommended. This recommendation follows the consensus statement: Guidelines for Management of Small Pulmonary Nodules Detected on CT Scans: A Statement from the [employer] [place] published in Radiology 2005; 237:395-400. Electronically Signed   By: MaInez Catalina.D.   On: 10/07/2015 08:06   Nm Sentinel Node Inj-no Rpt (breast)  10/06/2015  CLINICAL DATA: Breast Cancer, Left Sulfur colloid was injected intradermally by the nuclear medicine technologist for breast cancer sentinel node localization.   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 EXAM: SPECIMEN RADIOGRAPH OF THE left BREAST COMPARISON:  Previous exam(s). FINDINGS: Status post excision of the left breast. The wire tip and biopsy marker clip/mass are present and are marked for pathology. OR notified. IMPRESSION: Specimen radiograph of the left breast. Electronically Signed   By: DaMarin Olp.D.   On: 10/06/2015 12:12   Mm Lt Plc Breast Loc Dev   1st Lesion  Inc Mammo Guide  10/06/2015  CLINICAL DATA:  Patient presents for needle localization prior to surgical excision of a biopsy-proven malignancy  over the inner upper left breast. EXAM: NEEDLE LOCALIZATION OF THE LEFT BREAST WITH MAMMO GUIDANCE COMPARISON:  Previous exams. FINDINGS: Patient presents for needle localization prior to surgical excision. I met with the patient and we discussed the procedure of needle localization including benefits and alternatives. We discussed the high likelihood of a successful procedure. We discussed the risks of the procedure, including infection, bleeding, tissue injury, and further surgery. Informed, written consent was given. The usual time-out protocol was performed immediately prior to the procedure. Using mammographic guidance, sterile technique, 2% lidocaine and a 7 cm modified Kopans needle, the targeted mass/clip was localized using superior to inferior approach. The wire passes immediately posterior medial to the targeted clip as the mass/ clip lie adjacent to the middle of the reinforced segment of the wire. The images were marked for surgeon. IMPRESSION: Needle localization left breast. No apparent complications. Electronically Signed   By: DaMarin Olp  M.D.   On: 10/06/2015 09:15    Review of Systems  Unable to perform ROS: acuity of condition    Blood pressure 115/53, pulse 73, temperature 99.7 F (37.6 C), temperature source Oral, resp. rate 20, height 5' 2"  (1.575 m), weight 239 lb 13.8 oz (108.8 kg), SpO2 97 %. Physical Exam  Vitals reviewed. Constitutional: She is oriented to person, place, and time. She appears well-developed. She appears distressed.  HENT:  Head: Normocephalic and atraumatic.  Right Ear: External ear normal.  Left Ear: External ear normal.  Nose: Nose normal.  Mouth/Throat: Oropharynx is clear and moist.  Eyes: Conjunctivae are normal. Pupils are equal, round, and reactive to light. No scleral icterus.  Neck: Normal range of motion. Neck supple. No tracheal deviation present.  Cardiovascular: Normal rate, regular rhythm and intact distal pulses.  Exam reveals no  gallop.   No murmur heard. Respiratory: She is in respiratory distress. She has wheezes. She has rales.  tachypneic  GI: Soft. Bowel sounds are normal. She exhibits no distension. There is no tenderness.  Musculoskeletal: Normal range of motion. She exhibits no edema or tenderness.  Neurological: She is alert and oriented to person, place, and time. No cranial nerve deficit.  Skin: Skin is warm and dry. No rash noted. No erythema.  Left breast and left axillary incision sites, c/d/i no drainage     Assessment/Plan 69yrold female POD#1 Left breast lumpectomy and SLN biopsy with multiple medical issues and bad baseline COPD, here with exacerbation and increased work of breathing. Likely some element of having too much narcotic on board as well, increasing the problem. Admitted to medicine service, appreciate critical care help, will continue to follow.   CLavona MoundLoflin 10/07/2015, 6:45 PM

## 2015-10-07 NOTE — ED Notes (Addendum)
Pt presents to ED via [facility] with low 02 sats after accidental overdose. Pt had mastectomy yesterday, has been receiving percocet q4h, even when asleep. Pt eyes 2 and sluggish. Pt is moaning,

## 2015-10-07 NOTE — ED Notes (Signed)
Pt is able to talk and answer questions after receiving narcan.

## 2015-10-07 NOTE — ED Provider Notes (Signed)
-----------------------------------------   8:17 AM on 10/07/2015 -----------------------------------------  Care was assumed from Dr. Owens Shark pending CTA chest is negative for PE. Dr. Callie Fielding of general surgery has come by to evaluate the patient, no acute surgical intervention warranted. Case discussed with hospitalist at this time for admission.  Joanne Gavel, MD 10/07/15 [phone]

## 2015-10-07 NOTE — Progress Notes (Signed)
ANTIBIOTIC CONSULT NOTE - INITIAL  Pharmacy Consult for vancomycin & piperacillin/tazobactam Indication: pneumonia  Allergies  Allergen Reactions  . Other Hives and Rash    Shellfish. Shellfish Pt reports no allergic reaction to iodine or betadine.    Patient Measurements:   Adjusted Body Weight: 72.7 kg  Vital Signs: BP: 109/51 mmHg (11/24 0640) Pulse Rate: 79 (11/24 0640) Intake/Output from previous day:   Intake/Output from this shift:    Labs:  Recent Labs  10/07/15 0606  WBC 13.0*  HGB 12.8  PLT 277  CREATININE 0.67   Estimated Creatinine Clearance: 78.3 mL/min (by C-G formula based on Cr of 0.67). No results for input(s): VANCOTROUGH, VANCOPEAK, VANCORANDOM, GENTTROUGH, GENTPEAK, GENTRANDOM, TOBRATROUGH, TOBRAPEAK, TOBRARND, AMIKACINPEAK, AMIKACINTROU, AMIKACIN in the last 72 hours.   Microbiology: Recent Results (from the past 720 hour(s))  Surgical pcr screen     Status: None   Collection Time: 10/01/15 12:25 PM  Result Value Ref Range Status   MRSA, PCR NEGATIVE NEGATIVE Final   Staphylococcus aureus NEGATIVE NEGATIVE Final    Comment:        The Xpert SA Assay (FDA approved for NASAL specimens in patients over 51 years of age), is one component of a comprehensive surveillance program.  Test performance has been validated by [facility] for patients greater than or equal to 53 year old. It is not intended to diagnose infection nor to guide or monitor treatment.     Medical History: Past Medical History  Diagnosis Date  . Hyperlipidemia   . Hypertension   . Diabetes mellitus type 2, uncomplicated ([place])   . Hypothyroidism   . Psoriasis   . Depression   . COPD (chronic obstructive pulmonary disease) ([place])   . Osteoporosis, post-menopausal   . Vitamin D deficiency   . Sleep apnea   . Carpal tunnel syndrome     Medications:  Anti-infectives    Start     Dose/Rate Route Frequency Ordered Stop   10/07/15 1600  vancomycin (VANCOCIN) IVPB  1000 mg/200 mL premix     1,000 mg 200 mL/hr over 60 Minutes Intravenous Every 12 hours 10/07/15 0926     10/07/15 1400  piperacillin-tazobactam (ZOSYN) IVPB 3.375 g     3.375 g 12.5 mL/hr over 240 Minutes Intravenous 3 times per day 10/07/15 0926     10/07/15 1000  vancomycin (VANCOCIN) IVPB 1000 mg/200 mL premix     1,000 mg 200 mL/hr over 60 Minutes Intravenous  Once 10/07/15 0922     10/07/15 0930  piperacillin-tazobactam (ZOSYN) IVPB 3.375 g     3.375 g 100 mL/hr over 30 Minutes Intravenous  Once 10/07/15 0922     10/07/15 0915  azithromycin (ZITHROMAX) 500 mg in dextrose 5 % 250 mL IVPB     500 mg 250 mL/hr over 60 Minutes Intravenous Every 24 hours 10/07/15 [id number]       Assessment: Pharmacy consulted to dose vancomycin and piperacillin/tazobactam in this 69 year old female presenting to the ED with respiratory distress being treated for pneumonia.  Adjusted body weight = 72.7 kg CrCl ~ 90 mL/min based on adjusted body weight  Kinetics: Ke: 0.075 Half-life: 9.24 hours Vd: 50.9L  Cmin (calculated) ~16 mcg/mL  Goal of Therapy:  Vancomycin trough level 15-20 mcg/ml  Plan:  Measure antibiotic drug levels at steady state Follow up culture results  Vancomycin 1g dose given in ED Will follow with vancomycin 1g IV q12h (stacked dosing to begin 6 hours after initial dose) Vancomycin trough  ordered for 11/26 at 1530  Piperacillin/tazobactam 3.375 g IV q8h EI per renal function and indication.  Pharmacy will continue to monitor. Thank you for the consult.  Darylene Price Kevis Qu 10/07/2015,9:31 AM

## 2015-10-07 NOTE — Plan of Care (Signed)
Problem: ICU Phase Progression Outcomes Goal: O2 sats trending toward baseline Outcome: Progressing Sats good on 40% BIPAP. Still lethargic with some confusion. Has remained NPO due to this. ABG improving. Goal: Dyspnea controlled at rest Outcome: Progressing Shob with exertion. Goal: Hemodynamically stable Outcome: Progressing VSS. No pressors.Sats good on BIPAP. Goal: Pain controlled with appropriate interventions Outcome: Progressing Moans with chronic backpain and left breast post op pain Goal: Initial discharge plan identified Outcome: Progressing Plans to return to her home with spouse.  Has home bipap and home O2 Goal: Voiding-avoid urinary catheter unless indicated Outcome: Progressing Wears diaper. Incontinent of urine.

## 2015-10-07 NOTE — ED Notes (Signed)
Respiratory called to accompany the pt to CT with the BiPap machine.

## 2015-10-07 NOTE — Consult Note (Signed)
PULMONARY / CRITICAL CARE MEDICINE   Name: Ebony [name] MRN: [id number] DOB: 02/22/1946    ADMISSION DATE:  10/07/2015 CONSULTATION DATE:  10/07/15  REFERRING MD :  Dr. Ether Griffins   CHIEF COMPLAINT:     Shortness of breath   HISTORY OF PRESENT ILLNESS   69 y.o. female with a known history of multiple medical problems including obesity, hypertension, hyperlipidemia, diabetes, hypothyroidism, COPD, obstructive sleep apnea, ongoing tobacco abuse and recent left breast lumpectomy with sentinel node biopsy on 10/06/2015 by Dr. Azalee Course presents to the hospital with complaints of shortness of breath. According to emergency room physician, her oxygen saturations were in 80s when she arrived to emergency room. She was placed on BiPAP. Her labs were checked and they were unremarkable except of elevated white blood cell count and hyperkalemia. Chest x-ray was remarkable for vascular congestion and mild bilateral atelectasis, CT angiogram of the chest revealed postoperative changes in left breast. No PE. Pleural-based nodular density left lower lobe. Patient has a FEV1~41%, with severe COPD.    SIGNIFICANT EVENTS   PAST MEDICAL HISTORY    :  Past Medical History  Diagnosis Date  . Hyperlipidemia   . Hypertension   . Diabetes mellitus type 2, uncomplicated ([name])   . Hypothyroidism   . Psoriasis   . Depression   . COPD (chronic obstructive pulmonary disease) ([name])   . Osteoporosis, post-menopausal   . Vitamin D deficiency   . Sleep apnea   . Carpal tunnel syndrome    Past Surgical History  Procedure Laterality Date  . Tubal ligation    . Right heel spur removed    . Carpal tunnel Right 1995  . Thumb arthroscopy Left 2000    release of a nerve to the thumb.  . Breast excisional biopsy Left 10/06/2015    np for surgery lumpectomy   Prior to Admission medications   Medication Sig Start Date End Date Taking? Authorizing Provider  oxyCODONE-acetaminophen (PERCOCET) 7.5-325 MG tablet  Take 1-2 tablets by mouth every 4 (four) hours as needed for severe pain. 10/06/15  Yes Hubbard Robinson, MD  potassium chloride SA (K-DUR,KLOR-CON) 20 MEQ tablet Take 2 tablets (40 mEq total) by mouth 2 (two) times daily. 10/01/15  Yes Hubbard Robinson, MD  Adalimumab (HUMIRA PEN-PSORIASIS STARTER) 40 MG/0.8ML PNKT Inject 40 mg into the skin every 14 (fourteen) days.     Historical Provider, MD  albuterol (PROAIR HFA) 108 (90 BASE) MCG/ACT inhaler INHALE 2 PUFFS BY MOUTH every 4 hours as needed. 09/28/14   Historical Provider, MD  albuterol (PROVENTIL) (2.5 MG/3ML) 0.083% nebulizer solution Inhale 1 vial using nebulizer every six hours as needed    Historical Provider, MD  aspirin EC 81 MG tablet Take by mouth every morning.     Historical Provider, MD  budesonide-formoterol (SYMBICORT) 160-4.5 MCG/ACT inhaler INHALE 2 PUFFS BY MOUTH TWICE DAILY 09/15/14   Historical Provider, MD  buPROPion (WELLBUTRIN XL) 150 MG 24 hr tablet Take by mouth every morning.     Historical Provider, MD  citalopram (CELEXA) 40 MG tablet once daily in am    Historical Provider, MD  furosemide (LASIX) 20 MG tablet 2 (two) times daily.    Historical Provider, MD  glipiZIDE (GLUCOTROL) 5 MG tablet Take 1 tablet by mouth daily before lunch.  08/14/15   Historical Provider, MD  insulin aspart (NOVOLOG) 100 UNIT/ML injection 38-48 units subcutaneously three a day    Historical Provider, MD  insulin detemir (LEVEMIR) 100 UNIT/ML injection Inject  85 units subcutaneously twice a day    Historical Provider, MD  levothyroxine (SYNTHROID, LEVOTHROID) 200 MCG tablet Take by mouth daily before breakfast.     Historical Provider, MD  levothyroxine (SYNTHROID, LEVOTHROID) 50 MCG tablet Take by mouth daily before breakfast.     Historical Provider, MD  losartan-hydrochlorothiazide (HYZAAR) 100-25 MG tablet Take by mouth. In am.    Historical Provider, MD  metFORMIN (GLUCOPHAGE-XR) 500 MG 24 hr tablet Take 500 mg by mouth 2 (two) times  daily.     Historical Provider, MD  simvastatin (ZOCOR) 40 MG tablet Take by mouth at bedtime.     Historical Provider, MD  tiotropium (SPIRIVA HANDIHALER) 18 MCG inhalation capsule INHALE 1 CAPSULE AS DIRECTED ONCE DAILY in am. 08/24/14   Historical Provider, MD  vitamin E 400 UNIT capsule Take by mouth every morning.     Historical Provider, MD   Allergies  Allergen Reactions  . Other Hives and Rash    Shellfish. Shellfish Pt reports no allergic reaction to iodine or betadine.     FAMILY HISTORY   Family History  Problem Relation Age of Onset  . Diabetes Mother   . Thyroid disease Mother   . COPD Father   . Emphysema Father   . Heart disease Brother   . Diabetes Brother   . Heart disease Brother   . Thyroid disease Daughter   . Thyroid disease Son       SOCIAL HISTORY    reports that she has been smoking Cigarettes.  She has a 15 pack-year smoking history. She has never used smokeless tobacco. She reports that she does not drink alcohol or use illicit drugs.  Review of Systems  Constitutional: Negative for fever and chills.  Eyes: Negative for blurred vision and double vision.  Respiratory: Positive for shortness of breath and wheezing. Negative for cough, hemoptysis and sputum production.   Cardiovascular: Positive for chest pain. Negative for palpitations and orthopnea.  Gastrointestinal: Negative for nausea and vomiting.  Genitourinary: Negative for dysuria.  Musculoskeletal: Negative for myalgias.  Skin: Negative for itching and rash.  Neurological: Negative for dizziness and headaches.  Endo/Heme/Allergies: Does not bruise/bleed easily.      VITAL SIGNS    Temp:  [97.3 F (36.3 C)-98.9 F (37.2 C)] 98.9 F (37.2 C) (11/24 1130) Pulse Rate:  [79-105] 79 (11/24 0640) Resp:  [17-36] 20 (11/24 0640) BP: (106-173)/(45-117) 109/51 mmHg (11/24 0640) SpO2:  [85 %-100 %] 98 % (11/24 0742) Weight:  [239 lb 13.8 oz (108.8 kg)] 239 lb 13.8 oz (108.8 kg) (11/24  1130) HEMODYNAMICS:   VENTILATOR SETTINGS:   INTAKE / OUTPUT:  Intake/Output Summary (Last 24 hours) at 10/07/15 1219 Last data filed at 10/07/15 1005  Gross per 24 hour  Intake    300 ml  Output      0 ml  Net    300 ml       PHYSICAL EXAM   Physical Exam  Constitutional: She is oriented to person, place, and time. She appears well-developed and well-nourished.  HENT:  Head: Normocephalic and atraumatic.  Right Ear: External ear normal.  Left Ear: External ear normal.  Nose: Nose normal.  Eyes: EOM are normal. Pupils are equal, round, and reactive to light.  Neck: Normal range of motion.  Cardiovascular: Normal rate, regular rhythm, normal heart sounds and intact distal pulses.   Pulmonary/Chest:  On bipap - mild resp distress Mild diffuse wheezing Dec BS at the bases  Left Breast with  surgical scar - no drainage, clean appearance  Abdominal: Soft. Bowel sounds are normal. She exhibits no distension.  Musculoskeletal: Normal range of motion.  Neurological: She is alert and oriented to person, place, and time.  Skin: Skin is warm and dry.       LABS   LABS:  CBC  Recent Labs Lab 10/01/15 1219 10/07/15 0606  WBC 11.9* 13.0*  HGB 14.5 12.8  HCT 44.6 40.6  PLT 303 277   Coag's No results for input(s): APTT, INR in the last 168 hours. BMET  Recent Labs Lab 10/01/15 1219 10/07/15 0606  NA 138 138  K 2.9* 5.6*  CL 93* 103  CO2 35* 29  BUN 14 11  CREATININE 0.66 0.67  GLUCOSE 248* 237*   Electrolytes  Recent Labs Lab 10/01/15 1219 10/07/15 0606  CALCIUM 9.8 8.9   Sepsis Markers No results for input(s): LATICACIDVEN, PROCALCITON, O2SATVEN in the last 168 hours. ABG  Recent Labs Lab 10/07/15 0932 10/07/15 1155  PHART 7.22* 7.26*  PCO2ART 74* 69*  PO2ART 121* 92   Liver Enzymes No results for input(s): AST, ALT, ALKPHOS, BILITOT, ALBUMIN in the last 168 hours. Cardiac Enzymes No results for input(s): TROPONINI, PROBNP in the  last 168 hours. Glucose  Recent Labs Lab 10/06/15 1301  GLUCAP 212*     Recent Results (from the past 240 hour(s))  Surgical pcr screen     Status: None   Collection Time: 10/01/15 12:25 PM  Result Value Ref Range Status   MRSA, PCR NEGATIVE NEGATIVE Final   Staphylococcus aureus NEGATIVE NEGATIVE Final    Comment:        The Xpert SA Assay (FDA approved for NASAL specimens in patients over 28 years of age), is one component of a comprehensive surveillance program.  Test performance has been validated by [facility] for patients greater than or equal to 36 year old. It is not intended to diagnose infection nor to guide or monitor treatment.      Current facility-administered medications:  .  acetaminophen (TYLENOL) tablet 650 mg, 650 mg, Oral, Q6H PRN **OR** acetaminophen (TYLENOL) suppository 650 mg, 650 mg, Rectal, Q6H PRN, Theodoro Grist, MD .  albuterol (PROVENTIL) (2.5 MG/3ML) 0.083% nebulizer solution 2.5 mg, 2.5 mg, Nebulization, Q4H, Theodoro Grist, MD, 2.5 mg at 10/07/15 1204 .  aspirin EC tablet 81 mg, 81 mg, Oral, Daily, Theodoro Grist, MD .  azithromycin (ZITHROMAX) 500 mg in dextrose 5 % 250 mL IVPB, 500 mg, Intravenous, Q24H, Theodoro Grist, MD, Stopped at 10/07/15 1126 .  budesonide-formoterol (SYMBICORT) 160-4.5 MCG/ACT inhaler 2 puff, 2 puff, Inhalation, BID, Theodoro Grist, MD .  buPROPion (WELLBUTRIN XL) 24 hr tablet 150 mg, 150 mg, Oral, Daily, Theodoro Grist, MD .  citalopram (CELEXA) tablet 40 mg, 40 mg, Oral, Daily, Theodoro Grist, MD .  docusate sodium (COLACE) capsule 100 mg, 100 mg, Oral, BID, Theodoro Grist, MD .  enoxaparin (LOVENOX) injection 40 mg, 40 mg, Subcutaneous, BID, Theodoro Grist, MD .  furosemide (LASIX) tablet 20 mg, 20 mg, Oral, Daily, Theodoro Grist, MD .  losartan (COZAAR) tablet 100 mg, 100 mg, Oral, Daily **AND** hydrochlorothiazide (HYDRODIURIL) tablet 25 mg, 25 mg, Oral, Daily, Rima Vaickute, MD .  insulin detemir (LEVEMIR) injection  50 Units, 50 Units, Subcutaneous, BID, Theodoro Grist, MD .  levothyroxine (SYNTHROID, LEVOTHROID) tablet 200 mcg, 200 mcg, Oral, QAC breakfast, Theodoro Grist, MD .  Derrill Memo ON 10/08/2015] levothyroxine (SYNTHROID, LEVOTHROID) tablet 50 mcg, 50 mcg, Oral, QAC breakfast, Theodoro Grist, MD .  metFORMIN (GLUCOPHAGE-XR) 24 hr tablet 500 mg, 500 mg, Oral, BID, Theodoro Grist, MD .  methylPREDNISolone sodium succinate (SOLU-MEDROL) 125 mg/2 mL injection 60 mg, 60 mg, Intravenous, Q24H, Theodoro Grist, MD, 60 mg at 10/07/15 1008 .  ondansetron (ZOFRAN) tablet 4 mg, 4 mg, Oral, Q6H PRN **OR** ondansetron (ZOFRAN) injection 4 mg, 4 mg, Intravenous, Q6H PRN, Theodoro Grist, MD .  piperacillin-tazobactam (ZOSYN) IVPB 3.375 g, 3.375 g, Intravenous, 3 times per day, Lenis Noon, [facility] .  simvastatin (ZOCOR) tablet 40 mg, 40 mg, Oral, QHS, Rima Vaickute, MD .  sodium chloride 0.9 % injection 3 mL, 3 mL, Intravenous, Q12H, Theodoro Grist, MD .  tiotropium (SPIRIVA) inhalation capsule 18 mcg, 18 mcg, Inhalation, Daily, Theodoro Grist, MD .  vancomycin (VANCOCIN) IVPB 1000 mg/200 mL premix, 1,000 mg, Intravenous, Once, Lenis Noon, RPH .  vancomycin (VANCOCIN) IVPB 1000 mg/200 mL premix, 1,000 mg, Intravenous, Q12H, Lenis Noon, [facility]  IMAGING    Dg Chest 1 View  10/07/2015  CLINICAL DATA:  Acute onset of decreased O2 saturation, after accidental overdose. Initial encounter. EXAM: CHEST 1 VIEW COMPARISON:  Chest radiograph performed 10/01/2015 FINDINGS: The lungs are well-aerated. Vascular congestion is noted, with mild bilateral atelectasis. There is no evidence of pleural effusion or pneumothorax. The cardiomediastinal silhouette is borderline enlarged. No acute osseous abnormalities are seen. IMPRESSION: Vascular congestion and borderline cardiomegaly, with mild bilateral atelectasis. Electronically Signed   By: Garald Balding M.D.   On: 10/07/2015 06:31   Ct Angio Chest Pe W/cm &/or Wo Cm  10/07/2015  CLINICAL  DATA:  Chest pain and shortness of Breath, history of recent mastectomy EXAM: CT ANGIOGRAPHY CHEST WITH CONTRAST TECHNIQUE: Multidetector CT imaging of the chest was performed using the standard protocol during bolus administration of intravenous contrast. Multiplanar CT image reconstructions and MIPs were obtained to evaluate the vascular anatomy. CONTRAST:  176mL OMNIPAQUE IOHEXOL 350 MG/ML SOLN COMPARISON:  11/19/2014 FINDINGS: Lungs are well aerated bilaterally. No focal infiltrate or sizable effusion is noted. Changes of prior granulomatous disease are again noted. Previously seen nodular densities have improved in the interval likely postinflammatory in nature. Small area pleural based density is noted in the left lower lobe best seen on image number 89 of series 7. This measures approximately 10 mm in greatest dimension. It is likely postinflammatory in nature as it was not present on the prior exam. Followup examination is recommended. The thoracic inlet is within normal limits. The thoracic aorta shows no evidence of aneurysmal dilatation or dissection. The pulmonary artery is well opacified without evidence of pulmonary embolism. Postsurgical changes are noted in the left breast. The upper abdomen is within normal limits with the exception of an enlarged left adrenal gland stable from the prior study. The osseous structures show no acute abnormality. Review of the MIP images confirms the above findings. IMPRESSION: Postoperative changes in the left breast. No evidence of pulmonary emboli. Prior granulomatous disease. Pleural-based nodular appearing density in the left lower lobe as described. Given some changes in the interval from the previous exam this is likely postinflammatory in nature. If the patient is at high risk for bronchogenic carcinoma, follow-up chest CT at 3-72months is recommended. If the patient is at low risk for bronchogenic carcinoma, follow-up chest CT at 6-12 months is recommended.  This recommendation follows the consensus statement: Guidelines for Management of Small Pulmonary Nodules Detected on CT Scans: A Statement from the [place] as published in Radiology 2005; 237:395-400. Electronically Signed   By:  Inez Catalina M.D.   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 EXAM: SPECIMEN RADIOGRAPH OF THE left BREAST COMPARISON:  Previous exam(s). FINDINGS: Status post excision of the left breast. The wire tip and biopsy marker clip/mass are present and are marked for pathology. OR notified. IMPRESSION: Specimen radiograph of the left breast. Electronically Signed   By: Marin Olp M.D.   On: 10/06/2015 12:12      Indwelling Urinary Catheter continued, requirement due to   Reason to continue Indwelling Urinary Catheter for strict Intake/Output monitoring for hemodynamic instability   Central Line continued, requirement due to   Reason to continue [employer] Monitoring of central venous pressure or other hemodynamic parameters   Ventilator continued, requirement due to, resp failure    Ventilator Sedation RASS 0 to -2   Cultures: BCx2 11/24>> UC  Sputum 11/24>>  Antibiotics: Vanc 11/24>> Zosyn 11/24>>  Lines:   ASSESSMENT/PLAN  69 yo female s\p lumpectomy, PMHx of COPD, seen in consultation for acute respiratory failure  Respiratory failure - acute hypercarbic respiratory failure AECOPD ?HCAP Leukocytosis Left lower lobe nodule - Continue with BiPAP, minimal 4 hours at night, intermittent use during the day. -DuoNeb schedule every 6 hours for 3 days then continue with every 4 hours as needed for shortness of breath/wheezing -IV steroids 3 days, then transition to by mouth steroids, prednisone 40 mg taper over 2 weeks -Continue with empiric antibiotics -I believe this is more of a  bronchospastic response from recent surgery done a infectious etiology -Follow-up on sputum culture -Follow-up on blood cultures -Patient with severe COPD as an outpatient, recovery back to baseline breathing may be longer than normal. -Leukocytosis most likely stress related/reactive, expect elevated by blood cell with steroids -Limit use of opioids and benzos as this can worsen her hypercarbia - her hypercarbia is improving even with short use of bipap  -The pulmonary nodule is more likely reactive or from granulomatous disease, malignancy cannot be ruled out, at this time no inpatient workup is necessary, she is at high risk for a malignant nodule, and this can be further evaluated with an outpatient CT scan chest by her primary pulmonologist in 3 months. - high risk for intubation - currently mentating and tolerating the bipap - will monitor closely   I have personally obtained a history, examined the patient, evaluated laboratory and imaging results, formulated the assessment and plan and placed orders.  The Patient requires high complexity decision making for assessment and support, frequent evaluation and titration of therapies, application of advanced monitoring technologies and extensive interpretation of multiple databases.   Pulmonary Consult Time devoted to patient care services described in this note is 45 minutes.    Vilinda Boehringer, MD [facility] Pager [phone] (please enter 7-digits) On Call Pager [phone] (please enter 7-digits)   10/07/2015, 12:19 PM  Note: This note was prepared with Dragon dictation along with smaller phrase technology. Any transcriptional errors that result from this process are unintentional.

## 2015-10-07 NOTE — ED Notes (Signed)
Pt is alert, talking, answering Dr. Saul Fordyce questions after receiving Narcan.

## 2015-10-08 ENCOUNTER — Encounter: Payer: Self-pay | Admitting: Surgery

## 2015-10-08 LAB — TROPONIN I: Troponin I: <0.03 ng/mL (ref <0.031)

## 2015-10-08 LAB — BASIC METABOLIC PANEL
Anion gap: 7 (ref 5–15)
BUN: 12 mg/dL (ref 6–20)
CALCIUM: 8.8 mg/dL — AB (ref 8.9–10.3)
CHLORIDE: 102 mmol/L (ref 101–111)
CO2: 27 mmol/L (ref 22–32)
CREATININE: 0.56 mg/dL (ref 0.44–1.00)
GFR calc non Af Amer: >60 mL/min (ref >60)
Glucose, Bld: 177 mg/dL — ABNORMAL HIGH (ref 65–99)
Potassium: 4.1 mmol/L (ref 3.5–5.1)
SODIUM: 136 mmol/L (ref 135–145)

## 2015-10-08 LAB — EXPECTORATED SPUTUM ASSESSMENT W GRAM STAIN, RFLX TO RESP C

## 2015-10-08 LAB — CBC
HCT: 37.5 % (ref 35.0–47.0)
Hemoglobin: 12.1 g/dL (ref 12.0–16.0)
MCH: 30.5 pg (ref 26.0–34.0)
MCHC: 32.3 g/dL (ref 32.0–36.0)
MCV: 94.3 fL (ref 80.0–100.0)
PLATELETS: 223 10*3/uL (ref 150–440)
RBC: 3.98 MIL/uL (ref 3.80–5.20)
RDW: 13.5 % (ref 11.5–14.5)
WBC: 12.7 10*3/uL — AB (ref 3.6–11.0)

## 2015-10-08 LAB — EXPECTORATED SPUTUM ASSESSMENT W REFEX TO RESP CULTURE

## 2015-10-08 LAB — GLUCOSE, CAPILLARY
GLUCOSE-CAPILLARY: 152 mg/dL — AB (ref 65–99)
GLUCOSE-CAPILLARY: 316 mg/dL — AB (ref 65–99)
Glucose-Capillary: 304 mg/dL — ABNORMAL HIGH (ref 65–99)
Glucose-Capillary: 209 mg/dL — ABNORMAL HIGH (ref 65–99)

## 2015-10-08 MED ORDER — LEVOFLOXACIN 500 MG PO TABS
500.0000 mg | ORAL_TABLET | Freq: Every day | ORAL | Status: DC
  Administered 2015-10-08 – 2015-10-12 (×5): 500 mg via ORAL
  Filled 2015-10-08 (×5): qty 1

## 2015-10-08 MED ORDER — PREDNISONE 10 MG PO TABS: 10.0000 mg | ORAL_TABLET | Freq: Every day | ORAL | Status: DC

## 2015-10-08 MED ORDER — METHYLPREDNISOLONE SODIUM SUCC 125 MG IJ SOLR
60.0000 mg | Freq: Every day | INTRAMUSCULAR | Status: AC
  Administered 2015-10-09: 08:00:00 60 mg via INTRAVENOUS
  Filled 2015-10-08: qty 2

## 2015-10-08 MED ORDER — PREDNISONE 20 MG PO TABS
30.0000 mg | ORAL_TABLET | Freq: Every day | ORAL | Status: DC
  Administered 2015-10-12: 30 mg via ORAL
  Filled 2015-10-08 (×2): qty 1

## 2015-10-08 MED ORDER — PREDNISONE 20 MG PO TABS: 20.0000 mg | ORAL_TABLET | Freq: Every day | ORAL | Status: DC

## 2015-10-08 MED ORDER — CETYLPYRIDINIUM CHLORIDE 0.05 % MT LIQD
7.0000 mL | Freq: Two times a day (BID) | OROMUCOSAL | Status: DC
  Administered 2015-10-08 – 2015-10-12 (×5): 7 mL via OROMUCOSAL

## 2015-10-08 MED ORDER — PREDNISONE 20 MG PO TABS
40.0000 mg | ORAL_TABLET | Freq: Every day | ORAL | Status: AC
  Administered 2015-10-10 – 2015-10-11 (×2): 40 mg via ORAL
  Filled 2015-10-08 (×2): qty 2

## 2015-10-08 NOTE — Plan of Care (Signed)
Problem: Discharge Progression Outcomes Goal: Other Discharge Outcomes/Goals Outcome: Progressing Patient was a transfer from CCU. VSS. Continues on 4L oxygen, baseline is 2L. Telemetry monitored. No complaints of pain

## 2015-10-08 NOTE — Progress Notes (Signed)
Spoke with Dr. Ether Griffins about sputum culture. RN had obtained specimen in morning but lab assessed and deemed unsuitable for culture. RN confirmed with Dr. Ether Griffins that MD wanted culture reordered.

## 2015-10-08 NOTE — Consult Note (Signed)
PULMONARY / CRITICAL CARE MEDICINE   Name: Ebony [name] MRN: [id number] DOB: September 22, 1946    ADMISSION DATE:  10/07/2015 CONSULTATION DATE:  10/07/15  REFERRING MD :  Dr. Ether Griffins   CHIEF COMPLAINT:     Shortness of breath   HISTORY OF PRESENT ILLNESS   69 y.o. female with a known history of multiple medical problems including obesity, hypertension, hyperlipidemia, diabetes, hypothyroidism, COPD, obstructive sleep apnea, ongoing tobacco abuse and recent left breast lumpectomy with sentinel node biopsy on 10/06/2015 by Dr. Azalee Course presents to the hospital with complaints of shortness of breath. According to emergency room physician, her oxygen saturations were in 80s when she arrived to emergency room. She was placed on BiPAP. Her labs were checked and they were unremarkable except of elevated white blood cell count and hyperkalemia. Chest x-ray was remarkable for vascular congestion and mild bilateral atelectasis, CT angiogram of the chest revealed postoperative changes in left breast. No PE. Pleural-based nodular density left lower lobe. Patient has a FEV1~41%, with severe COPD.    SUBJECTIVE  patient off BiPAP this morning, alert and oriented, very interactive. States that she feels much better. Expresses improvement in her respiratory status.  SIGNIFICANT EVENTS   PAST MEDICAL HISTORY    :  Past Medical History  Diagnosis Date  . Hyperlipidemia   . Hypertension   . Diabetes mellitus type 2, uncomplicated ([place])   . Hypothyroidism   . Psoriasis   . Depression   . COPD (chronic obstructive pulmonary disease) ([place])   . Osteoporosis, post-menopausal   . Vitamin D deficiency   . Sleep apnea   . Carpal tunnel syndrome    Past Surgical History  Procedure Laterality Date  . Tubal ligation    . Right heel spur removed    . Carpal tunnel Right 1995  . Thumb arthroscopy Left 2000    release of a nerve to the thumb.  . Breast excisional biopsy Left 10/06/2015    np for  surgery lumpectomy   Prior to Admission medications   Medication Sig Start Date End Date Taking? Authorizing Provider  oxyCODONE-acetaminophen (PERCOCET) 7.5-325 MG tablet Take 1-2 tablets by mouth every 4 (four) hours as needed for severe pain. 10/06/15  Yes Hubbard Robinson, MD  potassium chloride SA (K-DUR,KLOR-CON) 20 MEQ tablet Take 2 tablets (40 mEq total) by mouth 2 (two) times daily. 10/01/15  Yes Hubbard Robinson, MD  Adalimumab (HUMIRA PEN-PSORIASIS STARTER) 40 MG/0.8ML PNKT Inject 40 mg into the skin every 14 (fourteen) days.     Historical Provider, MD  albuterol (PROAIR HFA) 108 (90 BASE) MCG/ACT inhaler INHALE 2 PUFFS BY MOUTH every 4 hours as needed. 09/28/14   Historical Provider, MD  albuterol (PROVENTIL) (2.5 MG/3ML) 0.083% nebulizer solution Inhale 1 vial using nebulizer every six hours as needed    Historical Provider, MD  aspirin EC 81 MG tablet Take by mouth every morning.     Historical Provider, MD  budesonide-formoterol (SYMBICORT) 160-4.5 MCG/ACT inhaler INHALE 2 PUFFS BY MOUTH TWICE DAILY 09/15/14   Historical Provider, MD  buPROPion (WELLBUTRIN XL) 150 MG 24 hr tablet Take by mouth every morning.     Historical Provider, MD  citalopram (CELEXA) 40 MG tablet once daily in am    Historical Provider, MD  furosemide (LASIX) 20 MG tablet 2 (two) times daily.    Historical Provider, MD  glipiZIDE (GLUCOTROL) 5 MG tablet Take 1 tablet by mouth daily before lunch.  08/14/15   Historical Provider, MD  insulin  aspart (NOVOLOG) 100 UNIT/ML injection 38-48 units subcutaneously three a day    Historical Provider, MD  insulin detemir (LEVEMIR) 100 UNIT/ML injection Inject 85 units subcutaneously twice a day    Historical Provider, MD  levothyroxine (SYNTHROID, LEVOTHROID) 200 MCG tablet Take by mouth daily before breakfast.     Historical Provider, MD  levothyroxine (SYNTHROID, LEVOTHROID) 50 MCG tablet Take by mouth daily before breakfast.     Historical Provider, MD   losartan-hydrochlorothiazide (HYZAAR) 100-25 MG tablet Take by mouth. In am.    Historical Provider, MD  metFORMIN (GLUCOPHAGE-XR) 500 MG 24 hr tablet Take 500 mg by mouth 2 (two) times daily.     Historical Provider, MD  simvastatin (ZOCOR) 40 MG tablet Take by mouth at bedtime.     Historical Provider, MD  tiotropium (SPIRIVA HANDIHALER) 18 MCG inhalation capsule INHALE 1 CAPSULE AS DIRECTED ONCE DAILY in am. 08/24/14   Historical Provider, MD  vitamin E 400 UNIT capsule Take by mouth every morning.     Historical Provider, MD   Allergies  Allergen Reactions  . Other Hives and Rash    Shellfish. Shellfish Pt reports no allergic reaction to iodine or betadine.     FAMILY HISTORY   Family History  Problem Relation Age of Onset  . Diabetes Mother   . Thyroid disease Mother   . COPD Father   . Emphysema Father   . Heart disease Brother   . Diabetes Brother   . Heart disease Brother   . Thyroid disease Daughter   . Thyroid disease Son       SOCIAL HISTORY    reports that she has been smoking Cigarettes.  She has a 15 pack-year smoking history. She has never used smokeless tobacco. She reports that she does not drink alcohol or use illicit drugs.  Review of Systems  Constitutional: Negative for fever and chills.  Eyes: Negative for blurred vision and double vision.  Respiratory: Positive for shortness of breath and wheezing. Negative for cough, hemoptysis and sputum production.        Sob and wheezing improving  Cardiovascular: Negative for chest pain, palpitations and orthopnea.  Gastrointestinal: Negative for nausea and vomiting.  Genitourinary: Negative for dysuria.  Musculoskeletal: Negative for myalgias.  Skin: Negative for itching and rash.  Neurological: Negative for dizziness and headaches.  Endo/Heme/Allergies: Does not bruise/bleed easily.      VITAL SIGNS    Temp:  [97.9 F (36.6 C)-99.7 F (37.6 C)] 97.9 F (36.6 C) (11/25 0758) Pulse Rate:  [59-82]  65 (11/25 0700) Resp:  [14-26] 20 (11/25 0700) BP: (94-133)/(32-74) 97/55 mmHg (11/25 0700) SpO2:  [91 %-98 %] 98 % (11/25 0700) FiO2 (%):  [40 %] 40 % (11/25 0223) Weight:  [239 lb 13.8 oz (108.8 kg)] 239 lb 13.8 oz (108.8 kg) (11/24 1130) HEMODYNAMICS:   VENTILATOR SETTINGS: Vent Mode:  [-]  FiO2 (%):  [40 %] 40 % INTAKE / OUTPUT:  Intake/Output Summary (Last 24 hours) at 10/08/15 [id number] Last data filed at 10/08/15 0600  Gross per 24 hour  Intake    850 ml  Output      0 ml  Net    850 ml       PHYSICAL EXAM   Physical Exam  Constitutional: She is oriented to person, place, and time. She appears well-developed and well-nourished.  HENT:  Head: Normocephalic and atraumatic.  Right Ear: External ear normal.  Left Ear: External ear normal.  Nose: Nose normal.  Eyes:  EOM are normal. Pupils are equal, round, and reactive to light.  Neck: Normal range of motion.  Cardiovascular: Normal rate, regular rhythm, normal heart sounds and intact distal pulses.   Pulmonary/Chest:  On [place] - faint wheezes - improved  - mild dec in basilar BS  Left Breast with surgical scar - no drainage, clean appearance  Abdominal: Soft. Bowel sounds are normal. She exhibits no distension.  Musculoskeletal: Normal range of motion.  Neurological: She is alert and oriented to person, place, and time.  Skin: Skin is warm and dry.       LABS   LABS:  CBC  Recent Labs Lab 10/01/15 1219 10/07/15 0606 10/08/15 0557  WBC 11.9* 13.0* 12.7*  HGB 14.5 12.8 12.1  HCT 44.6 40.6 37.5  PLT 303 277 223   Coag's No results for input(s): APTT, INR in the last 168 hours. BMET  Recent Labs Lab 10/01/15 1219 10/07/15 0606 10/07/15 1718 10/08/15 0557  NA 138 138  --  136  K 2.9* 5.6* 4.9 4.1  CL 93* 103  --  102  CO2 35* 29  --  27  BUN 14 11  --  12  CREATININE 0.66 0.67  --  0.56  GLUCOSE 248* 237*  --  177*   Electrolytes  Recent Labs Lab 10/01/15 1219 10/07/15 0606  10/08/15 0557  CALCIUM 9.8 8.9 8.8*   Sepsis Markers No results for input(s): LATICACIDVEN, PROCALCITON, O2SATVEN in the last 168 hours. ABG  Recent Labs Lab 10/07/15 0932 10/07/15 1155 10/07/15 1800  PHART 7.22* 7.26* 7.33*  PCO2ART 74* 69* 60*  PO2ART 121* 92 94   Liver Enzymes No results for input(s): AST, ALT, ALKPHOS, BILITOT, ALBUMIN in the last 168 hours. Cardiac Enzymes  Recent Labs Lab 10/07/15 1141 10/07/15 1718 10/07/15 2331  TROPONINI <0.03 <0.03 <0.03   Glucose  Recent Labs Lab 10/06/15 1301 10/07/15 1906 10/07/15 2111 10/08/15 0814  GLUCAP 212* 283* 261* 152*     Recent Results (from the past 240 hour(s))  Surgical pcr screen     Status: None   Collection Time: 10/01/15 12:25 PM  Result Value Ref Range Status   MRSA, PCR NEGATIVE NEGATIVE Final   Staphylococcus aureus NEGATIVE NEGATIVE Final    Comment:        The Xpert SA Assay (FDA approved for NASAL specimens in patients over 41 years of age), is one component of a comprehensive surveillance program.  Test performance has been validated by [facility] for patients greater than or equal to 69 year old. It is not intended to diagnose infection nor to guide or monitor treatment.      Current facility-administered medications:  .  acetaminophen (TYLENOL) tablet 650 mg, 650 mg, Oral, Q6H PRN **OR** acetaminophen (TYLENOL) suppository 650 mg, 650 mg, Rectal, Q6H PRN, Theodoro Grist, MD .  aspirin EC tablet 81 mg, 81 mg, Oral, Daily, Theodoro Grist, MD, 81 mg at 10/07/15 1432 .  budesonide (PULMICORT) nebulizer solution 0.25 mg, 0.25 mg, Nebulization, BID, Delvonte Berenson, MD, 0.25 mg at 10/08/15 0740 .  buPROPion (WELLBUTRIN XL) 24 hr tablet 150 mg, 150 mg, Oral, Daily, Theodoro Grist, MD, 150 mg at 10/07/15 1432 .  citalopram (CELEXA) tablet 20 mg, 20 mg, Oral, Daily, Anastasya Jewell, MD .  docusate sodium (COLACE) capsule 100 mg, 100 mg, Oral, BID, Theodoro Grist, MD, 100 mg at 10/07/15 1430 .   enoxaparin (LOVENOX) injection 40 mg, 40 mg, Subcutaneous, BID, Theodoro Grist, MD, 40 mg at 10/07/15 2116 .  furosemide (LASIX) tablet 20 mg, 20 mg, Oral, Daily, Theodoro Grist, MD, 20 mg at 10/07/15 1600 .  losartan (COZAAR) tablet 100 mg, 100 mg, Oral, Daily, 100 mg at 10/07/15 1427 **AND** hydrochlorothiazide (HYDRODIURIL) tablet 25 mg, 25 mg, Oral, Daily, Theodoro Grist, MD, 25 mg at 10/07/15 1426 .  insulin aspart (novoLOG) injection 0-5 Units, 0-5 Units, Subcutaneous, QHS, Merrilee Seashore, MD, 3 Units at 10/07/15 2116 .  insulin aspart (novoLOG) injection 0-9 Units, 0-9 Units, Subcutaneous, TID WC, Merrilee Seashore, MD .  insulin detemir (LEVEMIR) injection 50 Units, 50 Units, Subcutaneous, BID, Theodoro Grist, MD, 50 Units at 10/07/15 2117 .  ipratropium-albuterol (DUONEB) 0.5-2.5 (3) MG/3ML nebulizer solution 3 mL, 3 mL, Nebulization, Q4H PRN, Jacques Fife, MD .  ipratropium-albuterol (DUONEB) 0.5-2.5 (3) MG/3ML nebulizer solution 3 mL, 3 mL, Nebulization, Q6H, Leonda Cristo, MD, 3 mL at 10/08/15 0740 .  levothyroxine (SYNTHROID, LEVOTHROID) tablet 200 mcg, 200 mcg, Oral, QAC breakfast, Theodoro Grist, MD, 200 mcg at 10/08/15 0538 .  levothyroxine (SYNTHROID, LEVOTHROID) tablet 50 mcg, 50 mcg, Oral, QAC breakfast, Theodoro Grist, MD, 50 mcg at 10/08/15 0539 .  metFORMIN (GLUCOPHAGE-XR) 24 hr tablet 500 mg, 500 mg, Oral, BID, Theodoro Grist, MD, 500 mg at 10/08/15 0806 .  methylPREDNISolone sodium succinate (SOLU-MEDROL) 125 mg/2 mL injection 60 mg, 60 mg, Intravenous, Q24H, Theodoro Grist, MD, 60 mg at 10/07/15 1008 .  ondansetron (ZOFRAN) tablet 4 mg, 4 mg, Oral, Q6H PRN **OR** ondansetron (ZOFRAN) injection 4 mg, 4 mg, Intravenous, Q6H PRN, Theodoro Grist, MD .  piperacillin-tazobactam (ZOSYN) IVPB 3.375 g, 3.375 g, Intravenous, 3 times per day, Lenis Noon, RPH, 3.375 g at 10/08/15 0538 .  simvastatin (ZOCOR) tablet 40 mg, 40 mg, Oral, QHS, Theodoro Grist, MD, 40 mg at 10/07/15 2200 .   sodium chloride 0.9 % injection 3 mL, 3 mL, Intravenous, Q12H, Theodoro Grist, MD, 3 mL at 10/07/15 2200 .  vancomycin (VANCOCIN) IVPB 1000 mg/200 mL premix, 1,000 mg, Intravenous, Once, Lenis Noon, RPH, 1,000 mg at 10/07/15 1435 .  vancomycin (VANCOCIN) IVPB 1000 mg/200 mL premix, 1,000 mg, Intravenous, Q12H, Lenis Noon, RPH, 1,000 mg at 10/08/15 [id number]  IMAGING    No results found.    Indwelling Urinary Catheter continued, requirement due to   Reason to continue Indwelling Urinary Catheter for strict Intake/Output monitoring for hemodynamic instability   Central Line continued, requirement due to   Reason to continue [employer] Monitoring of central venous pressure or other hemodynamic parameters   Ventilator continued, requirement due to, resp failure    Ventilator Sedation RASS 0 to -2   Cultures: BCx2 11/24>> UC  Sputum 11/24>>  Antibiotics: Vanc 11/24>> Zosyn 11/24>> Azithromycin 11/24>>11/24  Lines:   ASSESSMENT/PLAN  69 yo female s\p lumpectomy, PMHx of COPD, seen in consultation for acute respiratory failure  Respiratory failure - acute hypercarbic respiratory failure AECOPD ?HCAP Leukocytosis Left lower lobe nodule - Continue with BiPAP, minimal 4 hours at night, intermittent use during the day. -DuoNeb schedule every 6 hours for 3 days then continue with every 4 hours as needed for shortness of breath/wheezing -IV steroids 3 days, then transition to by mouth steroids, prednisone 40 mg taper over 2 weeks -Continue with empiric antibiotics -I believe this is more of a bronchospastic response from recent surgery and not an infectious etiology -Follow-up on sputum culture -Follow-up on blood cultures -Patient with severe COPD as an outpatient, recovery back to baseline breathing may be longer than normal. -Leukocytosis most likely stress related/reactive,  expect elevated WBC with steroids - patient with clinical improvement.  -Limit use of opioids  and benzos as this can worsen her hypercarbia - her hypercarbia is improving even with short use of bipap  -The pulmonary nodule is more likely reactive or from granulomatous disease, malignancy cannot be ruled out, at this time no inpatient workup is necessary, she is at high risk for a malignant nodule, and this can be further evaluated with an outpatient CT scan chest by her primary pulmonologist in 3 months. - high risk for intubation - currently mentating and tolerating the bipap - will monitor closely    - Stable for transfer to medical floor    Pulmonary Consult Time devoted to patient care services described in this note is 35 minutes.    Vilinda Boehringer, MD [facility] Pager [phone] (please enter 7-digits) On Call Pager [phone] (please enter 7-digits)   10/08/2015, 8:28 AM  Note: This note was prepared with Dragon dictation along with smaller phrase technology. Any transcriptional errors that result from this process are unintentional.

## 2015-10-08 NOTE — Progress Notes (Signed)
Report called to [place] RN on 1C. Patient to be moved to room 124. Family at bedside and notified of transfer. Patient up to bedside commode, unsteady 1 assist, and was a 2 assist up to new bed for transfer. Belongings had been sent home with patient's husband the day before per patient's daughter.

## 2015-10-08 NOTE — Progress Notes (Signed)
[facility] [place] NAME: Ebony [name]    MR#:  [id number]  DATE OF BIRTH:  1945-11-20  SUBJECTIVE:  CHIEF COMPLAINT:   Chief Complaint  Patient presents with  . Respiratory Distress   patient is 69 -year-old female with past medical history significant for the. History of COPD, tobacco abuse, ongoing, who presents to the hospital with severe shortness of breath, she was initiated on BiPAP in the emergency room and admitted to intensive care unit. Her initial chest x-ray was concerning for congestive heart failure, since she was significantly hypoxic. She also underwent CTA of the chest, revealed no PE, but left lower lobe density concerning for infection, patient was initiated on broad-spectrum antibiotic therapy and admitted to intensive care unit, today she is weaned off BiPAP and initiated on 5 L of oxygen. Feels somewhat  more comfortable today. Still significant cough, sputum production, as well as wheezing, despite current therapy with steroids as well as inhalation therapy. Overall feels better     ,   Review of Systems  Constitutional: Negative for fever, chills and weight loss.  HENT: Negative for congestion.   Eyes: Negative for blurred vision and double vision.  Respiratory: Positive for cough, sputum production, shortness of breath and wheezing.   Cardiovascular: Negative for chest pain, palpitations, orthopnea, leg swelling and PND.  Gastrointestinal: Negative for nausea, vomiting, abdominal pain, diarrhea, constipation and blood in stool.  Genitourinary: Negative for dysuria, urgency, frequency and hematuria.  Musculoskeletal: Negative for falls.  Neurological: Negative for dizziness, tremors, focal weakness and headaches.  Endo/Heme/Allergies: Does not bruise/bleed easily.  Psychiatric/Behavioral: Negative for depression. The patient does not have insomnia.     VITAL SIGNS: Blood pressure 144/60, pulse 107,  temperature 97.8 F (36.6 C), temperature source Oral, resp. rate 20, height 5\' 2"  (1.575 m), weight 108.8 kg (239 lb 13.8 oz), SpO2 96 %.  PHYSICAL EXAMINATION:   GENERAL:  69 y.o.-year-old patient lying in the be, in mild-to-moderate respiratory distress, remains tachypneic, using accessory muscles to breathe.  EYES: Pupils equal, round, reactive to light and accommodation. No scleral icterus. Extraocular muscles intact.  HEENT: Head atraumatic, normocephalic. Oropharynx and nasopharynx clear.  NECK:  Supple, no jugular venous distention. No thyroid enlargement, no tenderness.  LUNGS: diminished sounds bilateralbilateral wheezing, rales,rhonchi ,  no crepitation., using accessory muscles of respiration, especially with exertion.  CARDIOVASCULAR: S1, S2 normal. No murmurs, rubs, or gallops.  ABDOMEN: Soft, nontender, nondistended. Bowel sounds present. No organomegaly or mass.  EXTREMITIES: No pedal edema, cyanosis, or clubbing.  NEUROLOGIC: Cranial nerves II through XII are intact. Muscle strength 5/5 in all extremities. Sensation intact. Gait not checked.  PSYCHIATRIC: The patient is alert and oriented x 3.  SKIN: No obvious rash, lesion, or ulcer.   ORDERS/RESULTS REVIEWED:   CBC  Recent Labs Lab 10/07/15 0606 10/08/15 0557  WBC 13.0* 12.7*  HGB 12.8 12.1  HCT 40.6 37.5  PLT 277 223  MCV 95.1 94.3  MCH 30.0 30.5  MCHC 31.5* 32.3  RDW 13.6 13.5   ------------------------------------------------------------------------------------------------------------------  Chemistries   Recent Labs Lab 10/07/15 0606 10/07/15 1718 10/08/15 0557  NA 138  --  136  K 5.6* 4.9 4.1  CL 103  --  102  CO2 29  --  27  GLUCOSE 237*  --  177*  BUN 11  --  12  CREATININE 0.67  --  0.56  CALCIUM 8.9  --  8.8*   ------------------------------------------------------------------------------------------------------------------ estimated creatinine  clearance is 79.3 mL/min (by C-G formula  based on Cr of 0.56). ------------------------------------------------------------------------------------------------------------------ No results for input(s): TSH, T4TOTAL, T3FREE, THYROIDAB in the last 72 hours.  Invalid input(s): FREET3  Cardiac Enzymes  Recent Labs Lab 10/07/15 1141 10/07/15 1718 10/07/15 2331  TROPONINI <0.03 <0.03 <0.03   ------------------------------------------------------------------------------------------------------------------ Invalid input(s): POCBNP ---------------------------------------------------------------------------------------------------------------  RADIOLOGY: Dg Chest 1 View  10/07/2015  CLINICAL DATA:  Acute onset of decreased O2 saturation, after accidental overdose. Initial encounter. EXAM: CHEST 1 VIEW COMPARISON:  Chest radiograph performed 10/01/2015 FINDINGS: The lungs are well-aerated. Vascular congestion is noted, with mild bilateral atelectasis. There is no evidence of pleural effusion or pneumothorax. The cardiomediastinal silhouette is borderline enlarged. No acute osseous abnormalities are seen. IMPRESSION: Vascular congestion and borderline cardiomegaly, with mild bilateral atelectasis. Electronically Signed   By: Garald Balding M.D.   On: 10/07/2015 06:31   Ct Angio Chest Pe W/cm &/or Wo Cm  10/07/2015  CLINICAL DATA:  Chest pain and shortness of Breath, history of recent mastectomy EXAM: CT ANGIOGRAPHY CHEST WITH CONTRAST TECHNIQUE: Multidetector CT imaging of the chest was performed using the standard protocol during bolus administration of intravenous contrast. Multiplanar CT image reconstructions and MIPs were obtained to evaluate the vascular anatomy. CONTRAST:  171mL OMNIPAQUE IOHEXOL 350 MG/ML SOLN COMPARISON:  11/19/2014 FINDINGS: Lungs are well aerated bilaterally. No focal infiltrate or sizable effusion is noted. Changes of prior granulomatous disease are again noted. Previously seen nodular densities have improved in  the interval likely postinflammatory in nature. Small area pleural based density is noted in the left lower lobe best seen on image number 89 of series 7. This measures approximately 10 mm in greatest dimension. It is likely postinflammatory in nature as it was not present on the prior exam. Followup examination is recommended. The thoracic inlet is within normal limits. The thoracic aorta shows no evidence of aneurysmal dilatation or dissection. The pulmonary artery is well opacified without evidence of pulmonary embolism. Postsurgical changes are noted in the left breast. The upper abdomen is within normal limits with the exception of an enlarged left adrenal gland stable from the prior study. The osseous structures show no acute abnormality. Review of the MIP images confirms the above findings. IMPRESSION: Postoperative changes in the left breast. No evidence of pulmonary emboli. Prior granulomatous disease. Pleural-based nodular appearing density in the left lower lobe as described. Given some changes in the interval from the previous exam this is likely postinflammatory in nature. If the patient is at high risk for bronchogenic carcinoma, follow-up chest CT at 3-20months is recommended. If the patient is at low risk for bronchogenic carcinoma, follow-up chest CT at 6-12 months is recommended. This recommendation follows the consensus statement: Guidelines for Management of Small Pulmonary Nodules Detected on CT Scans: A Statement from the [place] as published in Radiology 2005; 237:395-400. Electronically Signed   By: Inez Catalina M.D.   On: 10/07/2015 08:06    EKG:  Orders placed or performed during the hospital encounter of 10/07/15  . ED EKG within 10 minutes  . ED EKG within 10 minutes    ASSESSMENT AND PLAN:  Principal Problem:   Acute respiratory failure with hypoxia (HCC) Active Problems:   COPD exacerbation (HCC)   Community acquired pneumonia   Hyperkalemia 1. Acute  respiratory failure with hypoxia, COPD related, no pulmonary embolus on CT angiogram, although pneumonia was noted in left lower lobe, patient was given a few doses of vancomycin, Zosyn , changed to levofloxacin orally now ,  continue  steroids, inhalation therapy , oxygen. Pulmonary consultation is appreciated 2. COPD exacerbation, continue steroids, inhalation therapy, nebulizers, stable, improved clinically 3. Left lower lobe pneumonia, discontinue vancomycin, Zosyn , initiate levofloxacin orally. Get sputum cultures if possible 4. Hyperkalemia. Status post one dose of Kayexalate. Resolved  5. Leukocytosis, likely due to pneumonia versus stress, stable, improved with therapy   Management plans discussed with the patient, family and they are in agreement.   DRUG ALLERGIES:  Allergies  Allergen Reactions  . Other Hives and Rash    Shellfish. Shellfish Pt reports no allergic reaction to iodine or betadine.    CODE STATUS:     Code Status Orders        Start     Ordered   10/07/15 1131  Full code   Continuous     10/07/15 1130      TOTAL critical care TIME TAKING CARE OF THIS PATIENT: 40 minutes.    Theodoro Grist M.D on 10/08/2015 at 2:50 PM  Between 7am to 6pm - Pager - [phone]  After 6pm go to [email or web address] - password [email or web address] [facility]  Office  [phone]  CC: Primary care physician; Renee Rival, NP

## 2015-10-08 NOTE — Care Management Note (Signed)
Case Management Note  Patient Details  Name: Ebony [name] MRN: [id number] Date of Birth: 09-08-1946  Subjective/Objective:   RNCM assessment for discharge planning. Acute respiratory failure with COPD exacerabation.  PMH: COPD, HTN, obesity, current smoker. Uses home O2 @2L  through [facility]. Uses a walker. Lives with her husband. PCP is Angelina Ok, NP.                  Action/Plan: Patient reluctant but open to home health. She prefers [place].  Referral called to [facility] with [facility] for SN and PT. Will monitor progress and follow.   Expected Discharge Date:                  Expected Discharge Plan:  [place]  In-House Referral:     Discharge planning Services  CM Consult  Post Acute Care Choice:  Home Health Choice offered to:  Patient  DME Arranged:    DME Agency:  [place] Arranged:  RN, Disease Management, PT [place] Agency:  [place]  Status of Service:  In process, will continue to follow  Medicare Important Message Given:    Date Medicare IM Given:    Medicare IM give by:    Date Additional Medicare IM Given:    Additional Medicare Important Message give by:     If discussed at [place] of Stay Meetings, dates discussed:    Additional Comments:  Jolly Mango, RN 10/08/2015, 10:44 AM

## 2015-10-08 NOTE — Progress Notes (Signed)
Pt requested to go back on Bipap. Pt states that she is SOB. Placed pt on BIPAP and she is tolerating well.

## 2015-10-08 NOTE — Progress Notes (Signed)
69 yr old female POD#2 from Left breast lumpectomy and SLN biopsy, with COPD exacerbation.  She is much improved today.  Wake and talking, she states only some soreness in incision sites.  She says breathing much better  Filed Vitals:   10/08/15 1000 10/08/15 1143  BP: 120/51 125/53  Pulse: 97 112  Temp:  99 F (37.2 C)  Resp: 22 20   PE:  Gen: NAD, resting comfortably, AAOx3 Chest: left breast and axillary wounds c/d/i with surgical glue in place  A/p: much improved from yesterday, would have tylenol and ibuprofen for pain and avoid narcotics.

## 2015-10-09 ENCOUNTER — Inpatient Hospital Stay: Payer: Medicare PPO

## 2015-10-09 DIAGNOSIS — J449 Chronic obstructive pulmonary disease, unspecified: Secondary | ICD-10-CM

## 2015-10-09 DIAGNOSIS — G4733 Obstructive sleep apnea (adult) (pediatric): Secondary | ICD-10-CM

## 2015-10-09 LAB — GLUCOSE, CAPILLARY
GLUCOSE-CAPILLARY: 251 mg/dL — AB (ref 65–99)
GLUCOSE-CAPILLARY: 157 mg/dL — AB (ref 65–99)
Glucose-Capillary: 135 mg/dL — ABNORMAL HIGH (ref 65–99)
Glucose-Capillary: 297 mg/dL — ABNORMAL HIGH (ref 65–99)

## 2015-10-09 MED ORDER — IPRATROPIUM-ALBUTEROL 0.5-2.5 (3) MG/3ML IN SOLN
3.0000 mL | RESPIRATORY_TRACT | Status: DC
  Administered 2015-10-09 – 2015-10-12 (×19): 3 mL via RESPIRATORY_TRACT
  Filled 2015-10-09 (×18): qty 3

## 2015-10-09 MED ORDER — GUAIFENESIN ER 600 MG PO TB12
600.0000 mg | ORAL_TABLET | Freq: Two times a day (BID) | ORAL | Status: DC
Start: 2015-10-09 — End: 2015-10-12
  Administered 2015-10-09 – 2015-10-12 (×7): 600 mg via ORAL
  Filled 2015-10-09 (×7): qty 1

## 2015-10-09 NOTE — Progress Notes (Signed)
[facility] [place] NAME: Ebony [name]    MR#:  [id number]  DATE OF BIRTH:  [birth year]  SUBJECTIVE:  CHIEF COMPLAINT:   Chief Complaint  Patient presents with  . Respiratory Distress   patient is 69 -year-old female with past medical history significant for the. History of COPD, tobacco abuse, ongoing, who presents to the hospital with severe shortness of breath, she was initiated on BiPAP in the emergency room and admitted to intensive care unit. Her initial chest x-ray was concerning for congestive heart failure, since she was significantly hypoxic. She also underwent CTA of the chest, revealed no PE, but left lower lobe density concerning for infection, patient was initiated on broad-spectrum antibiotic therapy and admitted to intensive care unit, today she is weaned off BiPAP and initiated on 5 L of oxygen. Feels somewhat  more comfortable today. Still significant cough, sputum production, as well as wheezing, despite current therapy with steroids as well as inhalation therapy. Overall feels better today, more comfortable even is moving around, short of breath, less wheezy, Minimal sputum production, thick clear to light yellow color sputum     ,   Review of Systems  Constitutional: Negative for fever, chills and weight loss.  HENT: Negative for congestion.   Eyes: Negative for blurred vision and double vision.  Respiratory: Positive for cough, sputum production, shortness of breath and wheezing.   Cardiovascular: Negative for chest pain, palpitations, orthopnea, leg swelling and PND.  Gastrointestinal: Negative for nausea, vomiting, abdominal pain, diarrhea, constipation and blood in stool.  Genitourinary: Negative for dysuria, urgency, frequency and hematuria.  Musculoskeletal: Negative for falls.  Neurological: Negative for dizziness, tremors, focal weakness and headaches.  Endo/Heme/Allergies: Does not bruise/bleed easily.   Psychiatric/Behavioral: Negative for depression. The patient does not have insomnia.     VITAL SIGNS: Blood pressure 124/56, pulse 76, temperature 97.7 F (36.5 C), temperature source Oral, resp. rate 18, height 5\' 2"  (1.575 m), weight 108.8 kg (239 lb 13.8 oz), SpO2 96 %.  PHYSICAL EXAMINATION:   GENERAL:  69 y.o.-year-old patient lying in the be, in mild-to-moderate respiratory distress, remains tachypneic, using accessory muscles to breathe.  EYES: Pupils equal, round, reactive to light and accommodation. No scleral icterus. Extraocular muscles intact.  HEENT: Head atraumatic, normocephalic. Oropharynx and nasopharynx clear.  NECK:  Supple, no jugular venous distention. No thyroid enlargement, no tenderness.  LUNGS: diminished sounds , . Bilateral wheezing, rales,rhonchi ,  no crepitation., But much improved since yesterday, not using accessory muscles of respiration, able to move around easier with less shortness of breath  CARDIOVASCULAR: S1, S2 normal. No murmurs, rubs, or gallops.  ABDOMEN: Soft, nontender, nondistended. Bowel sounds present. No organomegaly or mass.  EXTREMITIES: No pedal edema, cyanosis, or clubbing.  NEUROLOGIC: Cranial nerves II through XII are intact. Muscle strength 5/5 in all extremities. Sensation intact. Gait not checked.  PSYCHIATRIC: The patient is alert and oriented x 3.  SKIN: No obvious rash, lesion, or ulcer.   ORDERS/RESULTS REVIEWED:   CBC  Recent Labs Lab 10/07/15 0606 10/08/15 0557  WBC 13.0* 12.7*  HGB 12.8 12.1  HCT 40.6 37.5  PLT 277 223  MCV 95.1 94.3  MCH 30.0 30.5  MCHC 31.5* 32.3  RDW 13.6 13.5   ------------------------------------------------------------------------------------------------------------------  Chemistries   Recent Labs Lab 10/07/15 0606 10/07/15 1718 10/08/15 0557  NA 138  --  136  K 5.6* 4.9 4.1  CL 103  --  102  CO2 29  --  27  GLUCOSE 237*  --  177*  BUN 11  --  12  CREATININE 0.67  --  0.56   CALCIUM 8.9  --  8.8*   ------------------------------------------------------------------------------------------------------------------ estimated creatinine clearance is 79.3 mL/min (by C-G formula based on Cr of 0.56). ------------------------------------------------------------------------------------------------------------------ No results for input(s): TSH, T4TOTAL, T3FREE, THYROIDAB in the last 72 hours.  Invalid input(s): FREET3  Cardiac Enzymes  Recent Labs Lab 10/07/15 1141 10/07/15 1718 10/07/15 2331  TROPONINI <0.03 <0.03 <0.03   ------------------------------------------------------------------------------------------------------------------ Invalid input(s): POCBNP ---------------------------------------------------------------------------------------------------------------  RADIOLOGY: Dg Chest Port 1 View  10/09/2015  CLINICAL DATA:  Hypoxia.  Left breast cancer. EXAM: PORTABLE CHEST 1 VIEW COMPARISON:  10/07/2015 chest radiograph. FINDINGS: The patient is right rotated. Stable cardiomediastinal silhouette with top-normal heart size. No pneumothorax. No pleural effusion. No overt pulmonary edema. Mild left basilar atelectasis. IMPRESSION: Stable top-normal heart size without overt pulmonary edema. Mild left basilar atelectasis. Electronically Signed   By: Ilona Sorrel M.D.   On: 10/09/2015 07:51    EKG:  Orders placed or performed during the hospital encounter of 10/07/15  . ED EKG within 10 minutes  . ED EKG within 10 minutes    ASSESSMENT AND PLAN:  Principal Problem:   Acute respiratory failure with hypoxia (HCC) Active Problems:   COPD exacerbation (HCC)   Community acquired pneumonia   Hyperkalemia 1. Acute respiratory failure with hypoxia, COPD related, no pulmonary embolus on CT angiogram, although pneumonia was noted in left lower lobe, patient was given a few doses of vancomycin, Zosyn , changed to levofloxacin orally now , continue  steroids,  inhalation therapy , oxygen. The patient is improving.  Pulmonary consultation is appreciated 2. COPD exacerbation, continue steroids, inhalation therapy, nebulizers,  improved clinically, weaning off oxygen to baseline 2 L of oxygen through nasal cannulas 3. Left lower lobe pneumonia, of vancomycin, Zosyn , continue levofloxacin orally. Getting sputum cultures, add Humibid 4. Hyperkalemia. Status post one dose of Kayexalate. Resolved  5. Leukocytosis, due to pneumonia versus stress, follow in am   Management plans discussed with the patient, family and they are in agreement.   DRUG ALLERGIES:  Allergies  Allergen Reactions  . Other Hives and Rash    Shellfish. Shellfish Pt reports no allergic reaction to iodine or betadine.    CODE STATUS:     Code Status Orders        Start     Ordered   10/07/15 1131  Full code   Continuous     10/07/15 1130      TOTAL critical care TIME TAKING CARE OF THIS PATIENT: 40 minutes.    Theodoro Grist M.D on 10/09/2015 at 1:42 PM  Between 7am to 6pm - Pager - [phone]  After 6pm go to [email or web address] - password [email or web address] [place]  Office  [phone]  CC: Primary care physician; Renee Rival, NP

## 2015-10-09 NOTE — Plan of Care (Signed)
Problem: Discharge Progression Outcomes Goal: O2 sats > or equal 90% or at baseline Outcome: Progressing Pt sats between 95-98% on 4L of O2.  Has dyspnea upon exertion.  Pt refused nightly Bipap usage.  Continues duonebs. Goal: Home O2 if indicated Outcome: Progressing Pt on 2L O2 at home.

## 2015-10-09 NOTE — Plan of Care (Signed)
Problem: Discharge Progression Outcomes Goal: Other Discharge Outcomes/Goals Outcome: Progressing Patient has no complaints of pain. VSS. On 2L oxygen, which is patient's baseline at home.

## 2015-10-09 NOTE — Consult Note (Signed)
PULMONARY / CRITICAL CARE MEDICINE   Name: Ebony [name] MRN: [id number] DOB: 1945/12/17    ADMISSION DATE:  10/07/2015 CONSULTATION DATE:  10/07/15  REFERRING MD :  Dr. Ether Griffins   CHIEF COMPLAINT:     Shortness of breath   HISTORY OF PRESENT ILLNESS   69 y.o. female with a known history of multiple medical problems including obesity, hypertension, hyperlipidemia, diabetes, hypothyroidism, COPD, obstructive sleep apnea, ongoing tobacco abuse and recent left breast lumpectomy with sentinel node biopsy on 10/06/2015 by Dr. Azalee Course presents to the hospital with complaints of shortness of breath. According to emergency room physician, her oxygen saturations were in 80s when she arrived to emergency room. She was placed on BiPAP. Her labs were checked and they were unremarkable except of elevated white blood cell count and hyperkalemia. Chest x-ray was remarkable for vascular congestion and mild bilateral atelectasis, CT angiogram of the chest revealed postoperative changes in left breast. No PE. Pleural-based nodular density left lower lobe. Patient has a FEV1~41%, with severe COPD.    SUBJECTIVE Patient doing well this morning, did not wear her BiPAP last night, significant clinical improvement from initial admission. Still with mild wheezing and mild shortness of breath but overall feeling better  SIGNIFICANT EVENTS   PAST MEDICAL HISTORY    :  Past Medical History  Diagnosis Date  . Hyperlipidemia   . Hypertension   . Diabetes mellitus type 2, uncomplicated ([place])   . Hypothyroidism   . Psoriasis   . Depression   . COPD (chronic obstructive pulmonary disease) ([place])   . Osteoporosis, post-menopausal   . Vitamin D deficiency   . Sleep apnea   . Carpal tunnel syndrome    Past Surgical History  Procedure Laterality Date  . Tubal ligation    . Right heel spur removed    . Carpal tunnel Right 1995  . Thumb arthroscopy Left 2000    release of a nerve to the thumb.  . Breast  excisional biopsy Left 10/06/2015    np for surgery lumpectomy  . Breast lumpectomy with needle localization Left 10/06/2015    Procedure: BREAST LUMPECTOMY WITH NEEDLE LOCALIZATION;  Surgeon: Hubbard Robinson, MD;  Location: [facility] ORS;  Service: General;  Laterality: Left;  . Sentinel node biopsy Left 10/06/2015    Procedure: SENTINEL NODE BIOPSY;  Surgeon: Hubbard Robinson, MD;  Location: [facility] ORS;  Service: General;  Laterality: Left;   Prior to Admission medications   Medication Sig Start Date End Date Taking? Authorizing Provider  oxyCODONE-acetaminophen (PERCOCET) 7.5-325 MG tablet Take 1-2 tablets by mouth every 4 (four) hours as needed for severe pain. 10/06/15  Yes Hubbard Robinson, MD  potassium chloride SA (K-DUR,KLOR-CON) 20 MEQ tablet Take 2 tablets (40 mEq total) by mouth 2 (two) times daily. 10/01/15  Yes Hubbard Robinson, MD  Adalimumab (HUMIRA PEN-PSORIASIS STARTER) 40 MG/0.8ML PNKT Inject 40 mg into the skin every 14 (fourteen) days.     Historical Provider, MD  albuterol (PROAIR HFA) 108 (90 BASE) MCG/ACT inhaler INHALE 2 PUFFS BY MOUTH every 4 hours as needed. 09/28/14   Historical Provider, MD  albuterol (PROVENTIL) (2.5 MG/3ML) 0.083% nebulizer solution Inhale 1 vial using nebulizer every six hours as needed    Historical Provider, MD  aspirin EC 81 MG tablet Take by mouth every morning.     Historical Provider, MD  budesonide-formoterol (SYMBICORT) 160-4.5 MCG/ACT inhaler INHALE 2 PUFFS BY MOUTH TWICE DAILY 09/15/14   Historical Provider, MD  buPROPion (WELLBUTRIN XL)  150 MG 24 hr tablet Take by mouth every morning.     Historical Provider, MD  citalopram (CELEXA) 40 MG tablet once daily in am    Historical Provider, MD  furosemide (LASIX) 20 MG tablet 2 (two) times daily.    Historical Provider, MD  glipiZIDE (GLUCOTROL) 5 MG tablet Take 1 tablet by mouth daily before lunch.  08/14/15   Historical Provider, MD  insulin aspart (NOVOLOG) 100 UNIT/ML injection 38-48  units subcutaneously three a day    Historical Provider, MD  insulin detemir (LEVEMIR) 100 UNIT/ML injection Inject 85 units subcutaneously twice a day    Historical Provider, MD  levothyroxine (SYNTHROID, LEVOTHROID) 200 MCG tablet Take by mouth daily before breakfast.     Historical Provider, MD  levothyroxine (SYNTHROID, LEVOTHROID) 50 MCG tablet Take by mouth daily before breakfast.     Historical Provider, MD  losartan-hydrochlorothiazide (HYZAAR) 100-25 MG tablet Take by mouth. In am.    Historical Provider, MD  metFORMIN (GLUCOPHAGE-XR) 500 MG 24 hr tablet Take 500 mg by mouth 2 (two) times daily.     Historical Provider, MD  simvastatin (ZOCOR) 40 MG tablet Take by mouth at bedtime.     Historical Provider, MD  tiotropium (SPIRIVA HANDIHALER) 18 MCG inhalation capsule INHALE 1 CAPSULE AS DIRECTED ONCE DAILY in am. 08/24/14   Historical Provider, MD  vitamin E 400 UNIT capsule Take by mouth every morning.     Historical Provider, MD   Allergies  Allergen Reactions  . Other Hives and Rash    Shellfish. Shellfish Pt reports no allergic reaction to iodine or betadine.     FAMILY HISTORY   Family History  Problem Relation Age of Onset  . Diabetes Mother   . Thyroid disease Mother   . COPD Father   . Emphysema Father   . Heart disease Brother   . Diabetes Brother   . Heart disease Brother   . Thyroid disease Daughter   . Thyroid disease Son       SOCIAL HISTORY    reports that she has been smoking Cigarettes.  She has a 15 pack-year smoking history. She has never used smokeless tobacco. She reports that she does not drink alcohol or use illicit drugs.  Review of Systems  Constitutional: Negative for fever and chills.  Eyes: Negative for blurred vision and double vision.  Respiratory: Negative for cough, hemoptysis and sputum production.        Sob and wheezing improving  Cardiovascular: Negative for chest pain, palpitations and orthopnea.  Gastrointestinal: Negative for  nausea and vomiting.  Genitourinary: Negative for dysuria.  Musculoskeletal: Negative for myalgias.  Skin: Negative for itching and rash.  Neurological: Negative for dizziness and headaches.  Endo/Heme/Allergies: Does not bruise/bleed easily.      VITAL SIGNS    Temp:  [97.7 F (36.5 C)-99 F (37.2 C)] 97.7 F (36.5 C) (11/26 0448) Pulse Rate:  [76-112] 76 (11/26 0448) Resp:  [18-20] 18 (11/26 0448) BP: (116-144)/(51-60) 124/56 mmHg (11/26 0448) SpO2:  [94 %-99 %] 97 % (11/26 0722) HEMODYNAMICS:   VENTILATOR SETTINGS:   INTAKE / OUTPUT:  Intake/Output Summary (Last 24 hours) at 10/09/15 1115 Last data filed at 10/09/15 0800  Gross per 24 hour  Intake    600 ml  Output      0 ml  Net    600 ml       PHYSICAL EXAM   Physical Exam  Constitutional: She is oriented to person, place, and time.  She appears well-developed and well-nourished.  HENT:  Head: Normocephalic and atraumatic.  Right Ear: External ear normal.  Left Ear: External ear normal.  Nose: Nose normal.  Eyes: EOM are normal. Pupils are equal, round, and reactive to light.  Neck: Normal range of motion.  Cardiovascular: Normal rate, regular rhythm, normal heart sounds and intact distal pulses.   Pulmonary/Chest:  On [place] - faint wheezes - improved  - mild dec in basilar BS  Left Breast with surgical scar - no drainage, clean appearance  Abdominal: Soft. Bowel sounds are normal. She exhibits no distension.  Musculoskeletal: Normal range of motion.  Neurological: She is alert and oriented to person, place, and time.  Skin: Skin is warm and dry.       LABS   LABS:  CBC  Recent Labs Lab 10/07/15 0606 10/08/15 0557  WBC 13.0* 12.7*  HGB 12.8 12.1  HCT 40.6 37.5  PLT 277 223   Coag's No results for input(s): APTT, INR in the last 168 hours. BMET  Recent Labs Lab 10/07/15 0606 10/07/15 1718 10/08/15 0557  NA 138  --  136  K 5.6* 4.9 4.1  CL 103  --  102  CO2 29  --  27  BUN 11   --  12  CREATININE 0.67  --  0.56  GLUCOSE 237*  --  177*   Electrolytes  Recent Labs Lab 10/07/15 0606 10/08/15 0557  CALCIUM 8.9 8.8*   Sepsis Markers No results for input(s): LATICACIDVEN, PROCALCITON, O2SATVEN in the last 168 hours. ABG  Recent Labs Lab 10/07/15 0932 10/07/15 1155 10/07/15 1800  PHART 7.22* 7.26* 7.33*  PCO2ART 74* 69* 60*  PO2ART 121* 92 94   Liver Enzymes No results for input(s): AST, ALT, ALKPHOS, BILITOT, ALBUMIN in the last 168 hours. Cardiac Enzymes  Recent Labs Lab 10/07/15 1141 10/07/15 1718 10/07/15 2331  TROPONINI <0.03 <0.03 <0.03   Glucose  Recent Labs Lab 10/08/15 0814 10/08/15 1139 10/08/15 1635 10/08/15 2126 10/09/15 0710 10/09/15 1109  GLUCAP 152* 316* 304* 209* 135* 251*     Recent Results (from the past 240 hour(s))  Surgical pcr screen     Status: None   Collection Time: 10/01/15 12:25 PM  Result Value Ref Range Status   MRSA, PCR NEGATIVE NEGATIVE Final   Staphylococcus aureus NEGATIVE NEGATIVE Final    Comment:        The Xpert SA Assay (FDA approved for NASAL specimens in patients over 32 years of age), is one component of a comprehensive surveillance program.  Test performance has been validated by [facility] for patients greater than or equal to 41 year old. It is not intended to diagnose infection nor to guide or monitor treatment.   Culture, expectorated sputum-assessment     Status: None   Collection Time: 10/08/15  8:11 AM  Result Value Ref Range Status   Specimen Description EXPECTORATED SPUTUM  Final   Special Requests Immunocompromised  Final   Sputum evaluation   Final    Sputum specimen not acceptable for testing.  Please recollect.   Results Called to: [facility] MOORE,RN 10/08/2015 1047 BY JRS.    Report Status 10/08/2015 FINAL  Final     Current facility-administered medications:  .  acetaminophen (TYLENOL) tablet 650 mg, 650 mg, Oral, Q6H PRN **OR** acetaminophen (TYLENOL)  suppository 650 mg, 650 mg, Rectal, Q6H PRN, Theodoro Grist, MD .  antiseptic oral rinse (CPC / CETYLPYRIDINIUM CHLORIDE 0.05%) solution 7 mL, 7 mL, Mouth Rinse, BID,  Theodoro Grist, MD, 7 mL at 10/08/15 2027 .  aspirin EC tablet 81 mg, 81 mg, Oral, Daily, Theodoro Grist, MD, 81 mg at 10/09/15 0813 .  budesonide (PULMICORT) nebulizer solution 0.25 mg, 0.25 mg, Nebulization, BID, Shakaya Bhullar, MD, 0.25 mg at 10/09/15 0720 .  buPROPion (WELLBUTRIN XL) 24 hr tablet 150 mg, 150 mg, Oral, Daily, Theodoro Grist, MD, 150 mg at 10/09/15 0813 .  citalopram (CELEXA) tablet 20 mg, 20 mg, Oral, Daily, Yasser Hepp, MD, 20 mg at 10/09/15 0813 .  docusate sodium (COLACE) capsule 100 mg, 100 mg, Oral, BID, Theodoro Grist, MD, 100 mg at 10/09/15 0813 .  enoxaparin (LOVENOX) injection 40 mg, 40 mg, Subcutaneous, BID, Theodoro Grist, MD, 40 mg at 10/09/15 0813 .  furosemide (LASIX) tablet 20 mg, 20 mg, Oral, Daily, Theodoro Grist, MD, 20 mg at 10/09/15 0813 .  losartan (COZAAR) tablet 100 mg, 100 mg, Oral, Daily, 100 mg at 10/09/15 0813 **AND** hydrochlorothiazide (HYDRODIURIL) tablet 25 mg, 25 mg, Oral, Daily, Theodoro Grist, MD, 25 mg at 10/09/15 0813 .  insulin aspart (novoLOG) injection 0-5 Units, 0-5 Units, Subcutaneous, QHS, Merrilee Seashore, MD, 2 Units at 10/08/15 2220 .  insulin aspart (novoLOG) injection 0-9 Units, 0-9 Units, Subcutaneous, TID WC, Merrilee Seashore, MD, 1 Units at 10/09/15 0813 .  insulin detemir (LEVEMIR) injection 50 Units, 50 Units, Subcutaneous, BID, Theodoro Grist, MD, 50 Units at 10/09/15 0911 .  ipratropium-albuterol (DUONEB) 0.5-2.5 (3) MG/3ML nebulizer solution 3 mL, 3 mL, Nebulization, Q4H PRN, Khing Belcher, MD .  ipratropium-albuterol (DUONEB) 0.5-2.5 (3) MG/3ML nebulizer solution 3 mL, 3 mL, Nebulization, Q4H, Theodoro Grist, MD .  levofloxacin (LEVAQUIN) tablet 500 mg, 500 mg, Oral, Daily, Theodoro Grist, MD, 500 mg at 10/09/15 0813 .  levothyroxine (SYNTHROID, LEVOTHROID) tablet 200  mcg, 200 mcg, Oral, QAC breakfast, Theodoro Grist, MD, 200 mcg at 10/09/15 0550 .  levothyroxine (SYNTHROID, LEVOTHROID) tablet 50 mcg, 50 mcg, Oral, QAC breakfast, Theodoro Grist, MD, 50 mcg at 10/09/15 0551 .  metFORMIN (GLUCOPHAGE-XR) 24 hr tablet 500 mg, 500 mg, Oral, BID, Theodoro Grist, MD, 500 mg at 10/09/15 0813 .  ondansetron (ZOFRAN) tablet 4 mg, 4 mg, Oral, Q6H PRN **OR** ondansetron (ZOFRAN) injection 4 mg, 4 mg, Intravenous, Q6H PRN, Theodoro Grist, MD .  Margrett Rud methylPREDNISolone sodium succinate (SOLU-MEDROL) 125 mg/2 mL injection 60 mg, 60 mg, Intravenous, Daily, 60 mg at 10/09/15 0814 **FOLLOWED BY** [START ON 10/10/2015] predniSONE (DELTASONE) tablet 40 mg, 40 mg, Oral, Q breakfast **FOLLOWED BY** [START ON 10/12/2015] predniSONE (DELTASONE) tablet 30 mg, 30 mg, Oral, Q breakfast **FOLLOWED BY** [START ON 10/14/2015] predniSONE (DELTASONE) tablet 20 mg, 20 mg, Oral, Q breakfast **FOLLOWED BY** [START ON 10/16/2015] predniSONE (DELTASONE) tablet 10 mg, 10 mg, Oral, Q breakfast, Dollie Mayse, MD .  simvastatin (ZOCOR) tablet 40 mg, 40 mg, Oral, QHS, Theodoro Grist, MD, 40 mg at 10/08/15 2026 .  sodium chloride 0.9 % injection 3 mL, 3 mL, Intravenous, Q12H, Theodoro Grist, MD, 3 mL at 10/09/15 0911  IMAGING    Dg Chest Port 1 View  10/09/2015  CLINICAL DATA:  Hypoxia.  Left breast cancer. EXAM: PORTABLE CHEST 1 VIEW COMPARISON:  10/07/2015 chest radiograph. FINDINGS: The patient is right rotated. Stable cardiomediastinal silhouette with top-normal heart size. No pneumothorax. No pleural effusion. No overt pulmonary edema. Mild left basilar atelectasis. IMPRESSION: Stable top-normal heart size without overt pulmonary edema. Mild left basilar atelectasis. Electronically Signed   By: Ilona Sorrel M.D.   On: 10/09/2015 07:51  Indwelling Urinary Catheter continued, requirement due to   Reason to continue Indwelling Urinary Catheter for strict Intake/Output monitoring for hemodynamic  instability   Central Line continued, requirement due to   Reason to continue [employer] Monitoring of central venous pressure or other hemodynamic parameters   Ventilator continued, requirement due to, resp failure    Ventilator Sedation RASS 0 to -2   Cultures: BCx2 11/24>> UC  Sputum 11/24>> not acceptable for testing  Antibiotics: Vanc 11/24>> Zosyn 11/24>> Azithromycin 11/24>>11/24  Lines:   ASSESSMENT/PLAN  69 yo female s\p lumpectomy, PMHx of COPD, seen in consultation for acute respiratory failure  Respiratory failure - acute hypercarbic respiratory failure AECOPD - resolving well ?HCAP Leukocytosis Left lower lobe nodule OSA on CPAP at hom - Continue with BiPAP, minimal 4 hours at night, intermittent use during the day. -DuoNeb schedule every 6 hours for 3 days then continue with every 4 hours as needed for shortness of breath/wheezing -IV steroids 3 days, then transition to by mouth steroids, prednisone 40 mg taper over 2 weeks -Continue with empiric antibiotics - can d/c on augmentin for a total of 10 days  -I believe this is more of a bronchospastic response from recent surgery and not an infectious etiology -Follow-up on sputum culture>> not acceptable for testing, however patient had great critical improvement -Follow-up on blood cultures -Patient with severe COPD as an outpatient, recovery back to baseline breathing may be longer than normal. -Leukocytosis most likely stress related/reactive, expect elevated WBC with steroids - patient with clinical improvement.  -Limit use of opioids and benzos as this can worsen her hypercarbia - her hypercarbia is improving even with short use of bipap  -The pulmonary nodule is more likely reactive or from granulomatous disease, malignancy cannot be ruled out, at this time no inpatient workup is necessary, she is at high risk for a malignant nodule, and this can be further evaluated with an outpatient CT scan chest by  her primary pulmonologist in 3 months. - cont with home CPAP upon d\c.   From a pulmonary standpoint, patient is stable with great improvement, can follow-up lung nodule and COPD with primary pulmonologist as an outpatient.  Thank you for consulting [facility], we will signoff at this time.  Please feel free to contacts [place] with any questions.     Pulmonary Consult Time devoted to patient care services described in this note is 35 minutes.    Vilinda Boehringer, MD [facility] Pager [phone] (please enter 7-digits) On Call Pager [phone] (please enter 7-digits)   10/09/2015, 11:15 AM  Note: This note was prepared with Dragon dictation along with smaller phrase technology. Any transcriptional errors that result from this process are unintentional.

## 2015-10-10 LAB — GLUCOSE, CAPILLARY
GLUCOSE-CAPILLARY: 114 mg/dL — AB (ref 65–99)
GLUCOSE-CAPILLARY: 235 mg/dL — AB (ref 65–99)
GLUCOSE-CAPILLARY: 168 mg/dL — AB (ref 65–99)
Glucose-Capillary: 97 mg/dL (ref 65–99)
Glucose-Capillary: 185 mg/dL — ABNORMAL HIGH (ref 65–99)

## 2015-10-10 LAB — SURGICAL PATHOLOGY

## 2015-10-10 NOTE — Progress Notes (Signed)
69 yr old female POD#4 from Left breast lumpectomy and SLN biopsy, with COPD exacerbation.  She sitting up in bed, states feels better.  She is supposed to be getting up today and walking around.  States not having  much tenderness.    Filed Vitals:   10/10/15 0638 10/10/15 1408  BP: 100/52 105/52  Pulse: 63 68  Temp: 98.6 F (37 C) 97.5 F (36.4 C)  Resp: 18    PE:  Gen: NAD, resting comfortably, AAOx3 Chest: left breast and axillary wounds c/d/i with surgical glue in place  A/p: healing well; continue tylenol and ibuprofen for pain and avoid narcotics.

## 2015-10-10 NOTE — Progress Notes (Signed)
[place] [facility] [place] NAME: Ebony [name]    MR#:  [id number]  DATE OF BIRTH:  06-Aug-1946  SUBJECTIVE:  CHIEF COMPLAINT:   Chief Complaint  Patient presents with  . Respiratory Distress   patient is 69 -year-old female with past medical history significant for the. History of COPD, tobacco abuse, ongoing, who presents to the hospital with severe shortness of breath, she was initiated on BiPAP in the emergency room and admitted to intensive care unit. Her initial chest x-ray was concerning for congestive heart failure, since she was significantly hypoxic. She also underwent CTA of the chest, revealed no PE, but left lower lobe density concerning for infection, patient was initiated on broad-spectrum antibiotic therapy and admitted to intensive care unit, today she is weaned off BiPAP and initiated on 5 L of oxygen. Feels somewhat  more comfortable today. Still significant cough, sputum production, as well as wheezing, despite current therapy with steroids as well as inhalation therapy. Overall feels better , more comfortable , still wheezy, Minimal sputum production, thick clear to light yellow color sputum. Some improvement from yesterday. However, not significant. Lungs remain wheezing     ,   Review of Systems  Constitutional: Negative for fever, chills and weight loss.  HENT: Negative for congestion.   Eyes: Negative for blurred vision and double vision.  Respiratory: Positive for cough, sputum production, shortness of breath and wheezing.   Cardiovascular: Negative for chest pain, palpitations, orthopnea, leg swelling and PND.  Gastrointestinal: Negative for nausea, vomiting, abdominal pain, diarrhea, constipation and blood in stool.  Genitourinary: Negative for dysuria, urgency, frequency and hematuria.  Musculoskeletal: Negative for falls.  Neurological: Negative for dizziness, tremors, focal weakness and headaches.   Endo/Heme/Allergies: Does not bruise/bleed easily.  Psychiatric/Behavioral: Negative for depression. The patient does not have insomnia.     VITAL SIGNS: Blood pressure 105/52, pulse 68, temperature 97.5 F (36.4 C), temperature source Oral, resp. rate 18, height 5\' 2"  (1.575 m), weight 108.8 kg (239 lb 13.8 oz), SpO2 95 %.  PHYSICAL EXAMINATION:   GENERAL:  69 y.o.-year-old patient lying in the be, in mild-to-moderate respiratory distress, remains tachypneic, using accessory muscles to breathe.  EYES: Pupils equal, round, reactive to light and accommodation. No scleral icterus. Extraocular muscles intact.  HEENT: Head atraumatic, normocephalic. Oropharynx and nasopharynx clear.  NECK:  Supple, no jugular venous distention. No thyroid enlargement, no tenderness.  LUNGS:Better air entrance overall bilaterally.  Bilateral wheezing, rales,rhonchi ,  no crepitation., But much improved since yesterday, not using accessory muscles of respiration, able to move around easier with less shortness of breath  CARDIOVASCULAR: S1, S2 normal. No murmurs, rubs, or gallops.  ABDOMEN: Soft, nontender, nondistended. Bowel sounds present. No organomegaly or mass.  EXTREMITIES: No pedal edema, cyanosis, or clubbing.  NEUROLOGIC: Cranial nerves II through XII are intact. Muscle strength 5/5 in all extremities. Sensation intact. Gait not checked.  PSYCHIATRIC: The patient is alert and oriented x 3.  SKIN: No obvious rash, lesion, or ulcer.   ORDERS/RESULTS REVIEWED:   CBC  Recent Labs Lab 10/07/15 0606 10/08/15 0557  WBC 13.0* 12.7*  HGB 12.8 12.1  HCT 40.6 37.5  PLT 277 223  MCV 95.1 94.3  MCH 30.0 30.5  MCHC 31.5* 32.3  RDW 13.6 13.5   ------------------------------------------------------------------------------------------------------------------  Chemistries   Recent Labs Lab 10/07/15 0606 10/07/15 1718 10/08/15 0557  NA 138  --  136  K 5.6* 4.9 4.1  CL 103  --  102  CO2 29  --  27   GLUCOSE 237*  --  177*  BUN 11  --  12  CREATININE 0.67  --  0.56  CALCIUM 8.9  --  8.8*   ------------------------------------------------------------------------------------------------------------------ estimated creatinine clearance is 79.3 mL/min (by C-G formula based on Cr of 0.56). ------------------------------------------------------------------------------------------------------------------ No results for input(s): TSH, T4TOTAL, T3FREE, THYROIDAB in the last 72 hours.  Invalid input(s): FREET3  Cardiac Enzymes  Recent Labs Lab 10/07/15 1141 10/07/15 1718 10/07/15 2331  TROPONINI <0.03 <0.03 <0.03   ------------------------------------------------------------------------------------------------------------------ Invalid input(s): POCBNP ---------------------------------------------------------------------------------------------------------------  RADIOLOGY: Dg Chest Port 1 View  10/09/2015  CLINICAL DATA:  Hypoxia.  Left breast cancer. EXAM: PORTABLE CHEST 1 VIEW COMPARISON:  10/07/2015 chest radiograph. FINDINGS: The patient is right rotated. Stable cardiomediastinal silhouette with top-normal heart size. No pneumothorax. No pleural effusion. No overt pulmonary edema. Mild left basilar atelectasis. IMPRESSION: Stable top-normal heart size without overt pulmonary edema. Mild left basilar atelectasis. Electronically Signed   By: Ilona Sorrel M.D.   On: 10/09/2015 07:51    EKG:  Orders placed or performed during the hospital encounter of 10/07/15  . ED EKG within 10 minutes  . ED EKG within 10 minutes    ASSESSMENT AND PLAN:  Principal Problem:   Acute respiratory failure with hypoxia (HCC) Active Problems:   COPD exacerbation (HCC)   Community acquired pneumonia   Hyperkalemia 1. Acute respiratory failure with hypoxia, COPD related,  more bronchospasm than infection per pulmonologist,  no pulmonary embolus on CT angiogram, although  possible pneumonia was noted  in left lower lobe,  could have been aspiration, due to recent procedure,  patient was given a few doses of vancomycin, Zosyn ,  now changed to levofloxacin orally now , continue  steroids, inhalation therapy , oxygen. The patient is improving.  Pulmonary consultation is appreciated. Pulmonologist recommended to continue BiPAP for now at nighttime as well as her daytime as needed , will transition to CPAP for home . Patient is weaned down to 2 L of oxygen through nasal cannula, which is her baseline  2. COPD exacerbation, continue steroids, inhalation therapy, nebulizers,  improved clinically, weaned off to baseline 2 L of oxygen through nasal cannulas 3. Left lower lobe pneumonia,  continue levofloxacin orally. sputum cultures one out of acceptable for testing,continue Humibid  4. Hyperkalemia. Status post one dose of Kayexalate. Resolved  5. Leukocytosis, due to pneumonia versus stress, follow in am 6. Pulmonary nodule, felt to be likely reactive or from granulomatous disease. Follow up with outpatient CT scan was recommended in 3 months   Management plans discussed with the patient, family and they are in agreement.   DRUG ALLERGIES:  Allergies  Allergen Reactions  . Other Hives and Rash    Shellfish. Shellfish Pt reports no allergic reaction to iodine or betadine.    CODE STATUS:     Code Status Orders        Start     Ordered   10/07/15 1131  Full code   Continuous     10/07/15 1130      TOTAL critical care TIME TAKING CARE OF THIS PATIENT: 40 minutes.    Theodoro Grist M.D on 10/10/2015 at 4:16 PM  Between 7am to 6pm - Pager - [phone]  After 6pm go to [email or web address] - password [email or web address] [facility]  Office  [phone]  CC: Primary care physician; Renee Rival, NP

## 2015-10-10 NOTE — Plan of Care (Signed)
Problem: Phase I Progression Outcomes Goal: Other Phase II Outcomes/Goals Outcome: Progressing Plan of care progress: -wheezing has improved -dyspnea on exertion improves with rest and breathing treatments -o2 in place at 2L [place], baseline -cpap at night -continue PO abts and PO prednisone -no complaints of pain, no distress or discomfort noted

## 2015-10-10 NOTE — Progress Notes (Signed)
Have spoken with patient about intermittent wearing of BIPAP during the day.  Patient states she does not wish to wear BIPAP during the day only at night. This was stated to me yesterday as well.

## 2015-10-11 DIAGNOSIS — Z4889 Encounter for other specified surgical aftercare: Secondary | ICD-10-CM

## 2015-10-11 LAB — POCT I-STAT 4, (NA,K, GLUC, HGB,HCT)
Glucose, Bld: 243 mg/dL — ABNORMAL HIGH (ref 65–99)
HCT: 45 % (ref 36.0–46.0)
Hemoglobin: 15.3 g/dL — ABNORMAL HIGH (ref 12.0–15.0)
Potassium: 4.8 mmol/L (ref 3.5–5.1)
Sodium: 139 mmol/L (ref 135–145)

## 2015-10-11 LAB — GLUCOSE, CAPILLARY
GLUCOSE-CAPILLARY: 287 mg/dL — AB (ref 65–99)
GLUCOSE-CAPILLARY: 133 mg/dL — AB (ref 65–99)
GLUCOSE-CAPILLARY: 249 mg/dL — AB (ref 65–99)
GLUCOSE-CAPILLARY: 176 mg/dL — AB (ref 65–99)
Glucose-Capillary: 209 mg/dL — ABNORMAL HIGH (ref 65–99)

## 2015-10-11 NOTE — Care Management Important Message (Signed)
Important Message  Patient Details  Name: Ebony [name] MRN: [id number] Date of Birth: 03/19/1946   Medicare Important Message Given:  Yes    [name] 10/11/2015, 11:16 AM

## 2015-10-11 NOTE — Progress Notes (Signed)
[place] at [place] NAME: Ebony [name]    MR#:  [id number]  DATE OF BIRTH:  February 11, 1946  SUBJECTIVE: no more SOB, feels weak overall.  CHIEF COMPLAINT:   Chief Complaint  Patient presents with  . Respiratory Distress   patient is 69 -year-old female with past medical history significant for the. History of COPD, tobacco abuse, ongoing, who presents to the hospital with severe shortness of breath, she was initiated on BiPAP in the emergency room and admitted to intensive care unit. Her initial chest x-ray was concerning for congestive heart failure, since she was significantly hypoxic. She also underwent CTA of the chest, revealed no PE, but left lower lobe density concerning for infection, patient was initiated on broad-spectrum antibiotic , weaned off BiPAP and initiated on 5 L of oxygen. Feels somewhat  more comfortable today. less cough, sputum production, as well as wheezing  ,   Review of Systems  Constitutional: Negative for fever, chills and weight loss.  HENT: Negative for congestion.   Eyes: Negative for blurred vision and double vision.  Respiratory: Positive for cough, sputum production, shortness of breath and wheezing.   Cardiovascular: Negative for chest pain, palpitations, orthopnea, leg swelling and PND.  Gastrointestinal: Negative for nausea, vomiting, abdominal pain, diarrhea, constipation and blood in stool.  Genitourinary: Negative for dysuria, urgency, frequency and hematuria.  Musculoskeletal: Negative for falls.  Neurological: Negative for dizziness, tremors, focal weakness and headaches.  Endo/Heme/Allergies: Does not bruise/bleed easily.  Psychiatric/Behavioral: Negative for depression. The patient does not have insomnia.     VITAL SIGNS: Blood pressure 110/46, pulse 76, temperature 98 F (36.7 C), temperature source Oral, resp. rate 18, height 5\' 2"  (1.575 m), weight 108.8 kg (239 lb 13.8 oz), SpO2 96  %.  PHYSICAL EXAMINATION:   GENERAL:  69 y.o.-year-old patient lying in the be, in mild-to-moderate respiratory distress, remains tachypneic, using accessory muscles to breathe.  EYES: Pupils equal, round, reactive to light and accommodation. No scleral icterus. Extraocular muscles intact.  HEENT: Head atraumatic, normocephalic. Oropharynx and nasopharynx clear.  NECK:  Supple, no jugular venous distention. No thyroid enlargement, no tenderness.  LUNGS:Better air entrance overall bilaterally.  Bilateral wheezing, rales,rhonchi ,  no crepitation, not using accessory muscles of respiration, able to move around easier with less shortness of breath  CARDIOVASCULAR: S1, S2 normal. No murmurs, rubs, or gallops.  ABDOMEN: Soft, nontender, nondistended. Bowel sounds present. No organomegaly or mass.  EXTREMITIES: No pedal edema, cyanosis, or clubbing.  NEUROLOGIC: Cranial nerves II through XII are intact. Muscle strength 5/5 in all extremities. Sensation intact. Gait not checked.  PSYCHIATRIC: The patient is alert and oriented x 3.  SKIN: No obvious rash, lesion, or ulcer.   ORDERS/RESULTS REVIEWED:   CBC  Recent Labs Lab 10/06/15 0848 10/07/15 0606 10/08/15 0557  WBC  --  13.0* 12.7*  HGB 15.3* 12.8 12.1  HCT 45.0 40.6 37.5  PLT  --  277 223  MCV  --  95.1 94.3  MCH  --  30.0 30.5  MCHC  --  31.5* 32.3  RDW  --  13.6 13.5   ------------------------------------------------------------------------------------------------------------------  Chemistries   Recent Labs Lab 10/06/15 0848 10/07/15 0606 10/07/15 1718 10/08/15 0557  NA 139 138  --  136  K 4.8 5.6* 4.9 4.1  CL  --  103  --  102  CO2  --  29  --  27  GLUCOSE 243* 237*  --  177*  BUN  --  11  --  12  CREATININE  --  0.67  --  0.56  CALCIUM  --  8.9  --  8.8*   ------------------------------------------------------------------------------------------------------------------ estimated creatinine clearance is 79.3  mL/min (by C-G formula based on Cr of 0.56). ------------------------------------------------------------------------------------------------------------------ No results for input(s): TSH, T4TOTAL, T3FREE, THYROIDAB in the last 72 hours.  Invalid input(s): FREET3  Cardiac Enzymes  Recent Labs Lab 10/07/15 1141 10/07/15 1718 10/07/15 2331  TROPONINI <0.03 <0.03 <0.03   ------------------------------------------------------------------------------------------------------------------ Invalid input(s): POCBNP ---------------------------------------------------------------------------------------------------------------  RADIOLOGY: No results found.  ASSESSMENT AND PLAN:  1. Acute respiratory failure with hypoxia, COPD related,  more bronchospasm than infection per pulmonologist,  no pulmonary embolus on CT angiogram, although  possible pneumonia was noted in left lower lobe,  could have been aspiration, due to recent procedure,  patient was given a few doses of vancomycin, Zosyn ,    changed to levofloxacin orally  Will give total 5 days of ABx.   On oral  steroids, inhalation therapy , oxygen.    continue BiPAP for now at nighttime as well as her daytime as needed    weaned down to 2 L of oxygen through nasal cannula, which is her baseline  2. COPD exacerbation, continue steroids, inhalation therapy, nebulizers,  improved clinically, weaned off to baseline 2 L of oxygen through nasal cannulas 3. Left lower lobe pneumonia,  continue levofloxacin orally. sputum cultures one out of acceptable for testing,continue Humibid  4. Hyperkalemia. Status post one dose of Kayexalate. Resolved  5. Leukocytosis, due to pneumonia versus stress 6. Pulmonary nodule, felt to be likely reactive or from granulomatous disease. Follow up with outpatient CT scan was recommended in 3 months   Management plans discussed with the patient, family and they are in agreement.   DRUG ALLERGIES:  Allergies   Allergen Reactions  . Other Hives and Rash    Shellfish. Shellfish Pt reports no allergic reaction to iodine or betadine.    CODE STATUS:     Code Status Orders        Start     Ordered   10/07/15 1131  Full code   Continuous     10/07/15 1130      TOTAL TIME TAKING CARE OF THIS PATIENT: 30 minutes.    Vaughan Basta M.D on 10/11/2015 at 9:06 PM  Between 7am to 6pm - Pager - [phone]  After 6pm go to [email or web address] - password [email or web address] [facility]  Office  [phone]  CC: Primary care physician; Renee Rival, NP

## 2015-10-11 NOTE — Progress Notes (Signed)
CC: Shortness of breath Subjective: 69 year old female with known cardiopulmonary disease readmitted one day after breast surgery. Patient states she is back to her baseline breathing however she has not been out of bed were ambulated. Denies any significant pain from her surgical site is very happy with her surgical care.  Objective: Vital signs in last 24 hours: Temp:  [97.6 F (36.4 C)-98.6 F (37 C)] 98 F (36.7 C) (11/28 1300) Pulse Rate:  [51-77] 73 (11/28 1300) Resp:  [20] 20 (11/28 0401) BP: (111-139)/(54-60) 111/60 mmHg (11/28 1300) SpO2:  [95 %-100 %] 95 % (11/28 1300) FiO2 (%):  [40 %] 40 % (11/28 0407) Last BM Date: 10/10/15  Intake/Output from previous day: 11/27 0701 - 11/28 0700 In: 600 [P.O.:600] Out: -  Intake/Output this shift: Total I/O In: 483 [P.O.:480; I.V.:3] Out: -   Physical exam:  Gen.: Resting in bed in no acute distress Chest: Coarse breath sounds throughout Heart: Regular rate and rhythm Breast: Well approximated and healing surgical site without evidence of erythema or drainage.  Lab Results: CBC  No results for input(s): WBC, HGB, HCT, PLT in the last 72 hours. BMET No results for input(s): NA, K, CL, CO2, GLUCOSE, BUN, CREATININE, CALCIUM in the last 72 hours. PT/INR No results for input(s): LABPROT, INR in the last 72 hours. ABG No results for input(s): PHART, HCO3 in the last 72 hours.  Invalid input(s): PCO2, PO2  Studies/Results: No results found.  Anti-infectives: Anti-infectives    Start     Dose/Rate Route Frequency Ordered Stop   10/08/15 1200  levofloxacin (LEVAQUIN) tablet 500 mg     500 mg Oral Daily 10/08/15 1010     10/07/15 1600  vancomycin (VANCOCIN) IVPB 1000 mg/200 mL premix  Status:  Discontinued     1,000 mg 200 mL/hr over 60 Minutes Intravenous Every 12 hours 10/07/15 0926 10/08/15 1010   10/07/15 1400  piperacillin-tazobactam (ZOSYN) IVPB 3.375 g  Status:  Discontinued     3.375 g 12.5 mL/hr over 240  Minutes Intravenous 3 times per day 10/07/15 0926 10/08/15 1010   10/07/15 1000  vancomycin (VANCOCIN) IVPB 1000 mg/200 mL premix  Status:  Discontinued     1,000 mg 200 mL/hr over 60 Minutes Intravenous  Once 10/07/15 0922 10/08/15 1010   10/07/15 0930  piperacillin-tazobactam (ZOSYN) IVPB 3.375 g     3.375 g 100 mL/hr over 30 Minutes Intravenous  Once 10/07/15 0922 10/07/15 1055   10/07/15 0915  azithromycin (ZITHROMAX) 500 mg in dextrose 5 % 250 mL IVPB  Status:  Discontinued     500 mg 250 mL/hr over 60 Minutes Intravenous Every 24 hours 10/07/15 0905 10/07/15 1346      Assessment/Plan:  69 year old female postop day 5 status post left breast lumpectomy of sentinel lymph node biopsy. Admitted to medicine department with COPD exacerbation. Appears to be much improved. Continue to avoid narcotics. Disposition per primary team however she appears to be back to her usual baseline.  Kayloni Rocco T. Adonis Huguenin, MD, FACS  10/11/2015

## 2015-10-11 NOTE — Plan of Care (Signed)
Problem: Education: Goal: Knowledge of [facility] Education information/materials will improve Outcome: Progressing Planning discharge home today

## 2015-10-11 NOTE — Plan of Care (Signed)
Pt not seen by dr until late - will be d/ced tomorrow.  Need to get her up to BR/chair.  Uses a walker at home.

## 2015-10-12 LAB — CBC
HEMATOCRIT: 39.9 % (ref 35.0–47.0)
HEMOGLOBIN: 13.1 g/dL (ref 12.0–16.0)
MCH: 30.7 pg (ref 26.0–34.0)
MCHC: 32.8 g/dL (ref 32.0–36.0)
MCV: 93.5 fL (ref 80.0–100.0)
Platelets: 252 10*3/uL (ref 150–440)
RBC: 4.27 MIL/uL (ref 3.80–5.20)
RDW: 13.5 % (ref 11.5–14.5)
WBC: 10.6 10*3/uL (ref 3.6–11.0)

## 2015-10-12 LAB — GLUCOSE, CAPILLARY
GLUCOSE-CAPILLARY: 104 mg/dL — AB (ref 65–99)
GLUCOSE-CAPILLARY: 156 mg/dL — AB (ref 65–99)

## 2015-10-12 LAB — BASIC METABOLIC PANEL
Anion gap: 8 (ref 5–15)
BUN: 19 mg/dL (ref 6–20)
CHLORIDE: 99 mmol/L — AB (ref 101–111)
CO2: 34 mmol/L — AB (ref 22–32)
Calcium: 9.8 mg/dL (ref 8.9–10.3)
Creatinine, Ser: 0.57 mg/dL (ref 0.44–1.00)
GFR calc non Af Amer: >60 mL/min (ref >60)
Glucose, Bld: 105 mg/dL — ABNORMAL HIGH (ref 65–99)
POTASSIUM: 3.2 mmol/L — AB (ref 3.5–5.1)
SODIUM: 141 mmol/L (ref 135–145)

## 2015-10-12 MED ORDER — PREDNISONE 20 MG PO TABS: 20.0000 mg | ORAL_TABLET | Freq: Every day | ORAL | Status: DC

## 2015-10-12 MED ORDER — PREDNISONE 10 MG PO TABS: 10.0000 mg | ORAL_TABLET | Freq: Every day | ORAL | Status: DC

## 2015-10-12 MED ORDER — LEVOFLOXACIN 500 MG PO TABS: 500.0000 mg | ORAL_TABLET | Freq: Every day | ORAL | Status: DC

## 2015-10-12 MED ORDER — POTASSIUM CHLORIDE 20 MEQ/15ML (10%) PO SOLN: 40.0000 meq | Freq: Once | ORAL | Status: DC

## 2015-10-12 MED ORDER — PREDNISONE 10 MG PO TABS: 30.0000 mg | ORAL_TABLET | Freq: Every day | ORAL | Status: DC

## 2015-10-12 NOTE — Discharge Summary (Signed)
[name] NAME: Ebony [name]    MR#:  [id number]  DATE OF BIRTH:  12/24/1945  DATE OF ADMISSION:  10/07/2015 ADMITTING PHYSICIAN: Theodoro Grist, MD  DATE OF DISCHARGE: 10/12/2015  PRIMARY CARE PHYSICIAN: Renee Rival, NP    ADMISSION DIAGNOSIS:  COPD exacerbation ([place]) [J44.1] Acute respiratory failure with hypoxia ([place]) [J96.01]  DISCHARGE DIAGNOSIS:  Principal Problem:   Acute respiratory failure with hypoxia (HCC) Active Problems:   COPD exacerbation ([place])   Community acquired pneumonia   Hyperkalemia   Aftercare following surgery   SECONDARY DIAGNOSIS:   Past Medical History  Diagnosis Date  . Hyperlipidemia   . Hypertension   . Diabetes mellitus type 2, uncomplicated ([place])   . Hypothyroidism   . Psoriasis   . Depression   . COPD (chronic obstructive pulmonary disease) ([place])   . Osteoporosis, post-menopausal   . Vitamin D deficiency   . Sleep apnea   . Carpal tunnel syndrome     HOSPITAL COURSE:   69 y/o female who presented with acute respiratory failure with hypoxia and pneumonia.  1. Acute respiratory failure with hypoxia, this was due to COPD as well as LL PNA: CT showed no PE  She was initially on vancomycin and Zosyn ,  This was changed to PO levaquin. Patient is now on her baseline O2 of 2 L and wears BiPAP at night. On examination at discharge her lung sounds are clear to auscultation.  2. COPD exacerbation, this was treated with IV steroids, inhalers and nebulizer treatment. Patient is doing well and will continue with steroid taper.   3. Left lower lobe pneumonia: Patient will continue on outpatient treatment with Levaquin.   4. Hypokalemia: Potassium was repleted prior to discharge  5. Leukocytosis: This is due to IV steroids along with pneumonia.  6. Pulmonary nodule, felt to be likely reactive or from granulomatous disease. Follow up with outpatient CT scan was  recommended in 3 months  DISCHARGE CONDITIONS AND DIET:   Able for discharge home with home health care on a heart healthy diet  CONSULTS OBTAINED:  Treatment Team:  Hubbard Robinson, MD  DRUG ALLERGIES:   Allergies  Allergen Reactions  . Other Hives and Rash    Shellfish. Shellfish Pt reports no allergic reaction to iodine or betadine.    DISCHARGE MEDICATIONS:   Current Discharge Medication List    START taking these medications   Details  levofloxacin (LEVAQUIN) 500 MG tablet Take 1 tablet (500 mg total) by mouth daily. Qty: 5 tablet, Refills: 0    !! predniSONE (DELTASONE) 10 MG tablet Take 3 tablets (30 mg total) by mouth daily with breakfast. Qty: 2 tablet, Refills: 0    !! predniSONE (DELTASONE) 10 MG tablet Take 1 tablet (10 mg total) by mouth daily with breakfast. Qty: 2 tablet, Refills: 0    !! predniSONE (DELTASONE) 20 MG tablet Take 1 tablet (20 mg total) by mouth daily with breakfast. Qty: 2 tablet, Refills: 0     !! - Potential duplicate medications found. Please discuss with provider.    CONTINUE these medications which have NOT CHANGED   Details  oxyCODONE-acetaminophen (PERCOCET) 7.5-325 MG tablet Take 1-2 tablets by mouth every 4 (four) hours as needed for severe pain. Qty: 30 tablet, Refills: 0    potassium chloride SA (K-DUR,KLOR-CON) 20 MEQ tablet Take 2 tablets (40 mEq total) by mouth 2 (two) times daily. Qty: 20 tablet, Refills: 0  Adalimumab (HUMIRA PEN-PSORIASIS STARTER) 40 MG/0.8ML PNKT Inject 40 mg into the skin every 14 (fourteen) days.     albuterol (PROAIR HFA) 108 (90 BASE) MCG/ACT inhaler INHALE 2 PUFFS BY MOUTH every 4 hours as needed.    albuterol (PROVENTIL) (2.5 MG/3ML) 0.083% nebulizer solution Inhale 1 vial using nebulizer every six hours as needed    aspirin EC 81 MG tablet Take by mouth every morning.     budesonide-formoterol (SYMBICORT) 160-4.5 MCG/ACT inhaler INHALE 2 PUFFS BY MOUTH TWICE DAILY    buPROPion  (WELLBUTRIN XL) 150 MG 24 hr tablet Take by mouth every morning.     citalopram (CELEXA) 40 MG tablet once daily in am    furosemide (LASIX) 20 MG tablet 2 (two) times daily.    glipiZIDE (GLUCOTROL) 5 MG tablet Take 1 tablet by mouth daily before lunch.     insulin aspart (NOVOLOG) 100 UNIT/ML injection 38-48 units subcutaneously three a day    insulin detemir (LEVEMIR) 100 UNIT/ML injection Inject 85 units subcutaneously twice a day    !! levothyroxine (SYNTHROID, LEVOTHROID) 200 MCG tablet Take by mouth daily before breakfast.     !! levothyroxine (SYNTHROID, LEVOTHROID) 50 MCG tablet Take by mouth daily before breakfast.     losartan-hydrochlorothiazide (HYZAAR) 100-25 MG tablet Take by mouth. In am.    metFORMIN (GLUCOPHAGE-XR) 500 MG 24 hr tablet Take 500 mg by mouth 2 (two) times daily.     simvastatin (ZOCOR) 40 MG tablet Take by mouth at bedtime.     tiotropium (SPIRIVA HANDIHALER) 18 MCG inhalation capsule INHALE 1 CAPSULE AS DIRECTED ONCE DAILY in am.    vitamin E 400 UNIT capsule Take by mouth every morning.      !! - Potential duplicate medications found. Please discuss with provider.            Today   CHIEF COMPLAINT:   She is doing well this morning. Patient denies shortness of breath or chest pain.   VITAL SIGNS:  Blood pressure 111/46, pulse 72, temperature 97.4 F (36.3 C), temperature source Oral, resp. rate 18, height 5\' 2"  (1.575 m), weight 108.8 kg (239 lb 13.8 oz), SpO2 98 %.   REVIEW OF SYSTEMS:  Review of Systems  Constitutional: Negative for fever, chills and malaise/fatigue.  HENT: Negative for sore throat.   Eyes: Negative for blurred vision.  Respiratory: Negative for cough, hemoptysis, shortness of breath and wheezing.   Cardiovascular: Negative for chest pain, palpitations and leg swelling.  Gastrointestinal: Negative for nausea, vomiting, abdominal pain, diarrhea and blood in stool.  Genitourinary: Negative for dysuria.   Musculoskeletal: Negative for back pain.  Neurological: Negative for dizziness, tremors and headaches.  Endo/Heme/Allergies: Does not bruise/bleed easily.     PHYSICAL EXAMINATION:  GENERAL:  69 y.o.-year-old patient lying in the bed with no acute distress.  NECK:  Supple, no jugular venous distention. No thyroid enlargement, no tenderness.  LUNGS: Normal breath sounds bilaterally, no wheezing, rales,rhonchi  No use of accessory muscles of respiration.  CARDIOVASCULAR: S1, S2 normal. No murmurs, rubs, or gallops.  ABDOMEN: Soft, non-tender, non-distended. Bowel sounds present. No organomegaly or mass.  EXTREMITIES: No pedal edema, cyanosis, or clubbing.  PSYCHIATRIC: The patient is alert and oriented x 3.  SKIN: No obvious rash, lesion, or ulcer.   DATA REVIEW:   CBC  Recent Labs Lab 10/12/15 0425  WBC 10.6  HGB 13.1  HCT 39.9  PLT 252    Chemistries   Recent Labs Lab 10/12/15 0425  NA 141  K 3.2*  CL 99*  CO2 34*  GLUCOSE 105*  BUN 19  CREATININE 0.57  CALCIUM 9.8    Cardiac Enzymes  Recent Labs Lab 10/07/15 1141 10/07/15 1718 10/07/15 2331  TROPONINI <0.03 <0.03 <0.03    Microbiology Results  @[email or web address] @  RADIOLOGY:  No results found.    Management plans discussed with the patient and she is in agreement. Stable for discharge home  Patient should follow up with PCP in1 week   CODE STATUS:     Code Status Orders        Start     Ordered   10/07/15 1131  Full code   Continuous     10/07/15 1130      TOTAL TIME TAKING CARE OF THIS PATIENT: 35 minutes.    Note: This dictation was prepared with Dragon dictation along with smaller phrase technology. Any transcriptional errors that result from this process are unintentional.  Dayona Shaheen M.D on 10/12/2015 at 11:32 AM  Between 7am to 6pm - Pager - [phone] After 6pm go to [email or web address] - password [email or web address] [facility]  Office  [phone]  CC: Primary  care physician; Renee Rival, NP

## 2015-10-12 NOTE — Progress Notes (Signed)
All d/c forms completed and explained to pt. Pt verbalizes understanding and is aware of prescriptions called in to pharmacy by MD. All personal belongings returned to pt. O2 brought from home by pt's husband

## 2015-10-15 ENCOUNTER — Encounter: Payer: Self-pay | Admitting: Surgery

## 2015-10-15 ENCOUNTER — Ambulatory Visit (INDEPENDENT_AMBULATORY_CARE_PROVIDER_SITE_OTHER): Payer: Medicare PPO | Admitting: Surgery

## 2015-10-15 VITALS — BP 124/71 | HR 80 | Temp 97.4°F | Ht 63.0 in | Wt 235.0 lb

## 2015-10-15 DIAGNOSIS — C50912 Malignant neoplasm of unspecified site of left female breast: Secondary | ICD-10-CM

## 2015-10-15 NOTE — Progress Notes (Signed)
69 yr old female s/p Left breast lumpectomy with sentinel lymph node biopsy.  Patient doing well now that out of the hospital for COPD exacerbation.  She denies any soreness to area.  She doing well, breathing much better than before and is no longer using a walker.   Filed Vitals:   10/15/15 0949  BP: 124/71  Pulse: 80  Temp: 97.4 F (36.3 C)   PE: Left breast:  Incision site c/d/i, bruising to breast and some residual left axillary incision c/d/i  Path report: A. 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  - BIOPSY SITE CHANGES AND MARKER CLIP PRESENT.  - ALL MARGINS APPEAR CLEAR; 10 MM OR GREATER MARGIN WIDTH.   B. SENTINEL LYMPH NODE #1, LEFT AXILLA; BIOPSY;  - NEGATIVE FOR MALIGNANCY, ONE LYMPH NODE (0/1).   C. BREAST, LEFT, SUPERIOR MARGIN; EXCISION:  - NEGATIVE FOR ATYPIA AND MALIGNANCY.   D. BREAST, LEFT, LATERAL MARGIN; EXCISION:  - NEGATIVE FOR ATYPIA AND MALIGNANCY.   E. BREAST, LEFT, INFERIOR MARGIN; EXCISION:  - ATYPICAL DUCTAL HYPERPLASIA, SEE COMMENT.   Comment:  Atypical ductal hyperplasia (ADH) is at a cauterized margin (tissue not  oriented). ADH at a margin, in the absence of DCIS, with >10 mm margins  around the invasive tumor, is unlikely to be of clinical significance  and in this situation should not require re-excision.   F. BREAST, LEFT, MEDIAL MARGIN; EXCISION:  - NEGATIVE FOR ATYPIA AND MALIGNANCY.   Comment:  ER/PR/HER2 were assessed by immunohistochemistry (IHC) on the previous  core biopsy, [phone] (collected 08/25/15). The slides were  reviewed. In summary:  ER IHC (SP1): Positive, >90%  PR IHC (1E2): Positive, 51-90%  HER2 IHC (HercepTest): Negative, score 1+   A/P: Doing well postop now.  Discussed the pathology results with patient.  She will still need postop radiation and tamoxifen or equivolent.  She needs  to reschedule with Dr. Grayland Ormond for a family matter, discussed that it should be fine to change appointment to next week, to give his office a call.

## 2015-10-15 NOTE — Patient Instructions (Signed)
Please remember to call Dr. Grayland Ormond if you need to reschedule your appointment on 10/19/2015.  Please give [place] a call if you have any questions or concerns.

## 2015-10-19 ENCOUNTER — Inpatient Hospital Stay: Payer: Medicare PPO | Admitting: Oncology

## 2015-10-26 ENCOUNTER — Inpatient Hospital Stay: Payer: Medicare PPO | Admitting: Oncology

## 2015-11-03 ENCOUNTER — Inpatient Hospital Stay: Payer: Medicare PPO | Attending: Oncology | Admitting: Oncology

## 2015-11-03 VITALS — BP 135/73 | HR 103 | Temp 96.1°F | Resp 20 | Wt 235.5 lb

## 2015-11-03 DIAGNOSIS — Z171 Estrogen receptor negative status [ER-]: Secondary | ICD-10-CM | POA: Diagnosis not present

## 2015-11-03 DIAGNOSIS — E119 Type 2 diabetes mellitus without complications: Secondary | ICD-10-CM | POA: Insufficient documentation

## 2015-11-03 DIAGNOSIS — G4730 Sleep apnea, unspecified: Secondary | ICD-10-CM | POA: Insufficient documentation

## 2015-11-03 DIAGNOSIS — I10 Essential (primary) hypertension: Secondary | ICD-10-CM | POA: Insufficient documentation

## 2015-11-03 DIAGNOSIS — Z79899 Other long term (current) drug therapy: Secondary | ICD-10-CM | POA: Diagnosis not present

## 2015-11-03 DIAGNOSIS — Z7984 Long term (current) use of oral hypoglycemic drugs: Secondary | ICD-10-CM | POA: Insufficient documentation

## 2015-11-03 DIAGNOSIS — L409 Psoriasis, unspecified: Secondary | ICD-10-CM | POA: Insufficient documentation

## 2015-11-03 DIAGNOSIS — Z7982 Long term (current) use of aspirin: Secondary | ICD-10-CM | POA: Diagnosis not present

## 2015-11-03 DIAGNOSIS — G5600 Carpal tunnel syndrome, unspecified upper limb: Secondary | ICD-10-CM | POA: Diagnosis not present

## 2015-11-03 DIAGNOSIS — Z87891 Personal history of nicotine dependence: Secondary | ICD-10-CM | POA: Insufficient documentation

## 2015-11-03 DIAGNOSIS — E559 Vitamin D deficiency, unspecified: Secondary | ICD-10-CM

## 2015-11-03 DIAGNOSIS — J449 Chronic obstructive pulmonary disease, unspecified: Secondary | ICD-10-CM | POA: Insufficient documentation

## 2015-11-03 DIAGNOSIS — Z794 Long term (current) use of insulin: Secondary | ICD-10-CM | POA: Diagnosis not present

## 2015-11-03 DIAGNOSIS — E785 Hyperlipidemia, unspecified: Secondary | ICD-10-CM

## 2015-11-03 DIAGNOSIS — C50912 Malignant neoplasm of unspecified site of left female breast: Secondary | ICD-10-CM | POA: Diagnosis not present

## 2015-11-03 DIAGNOSIS — E039 Hypothyroidism, unspecified: Secondary | ICD-10-CM | POA: Insufficient documentation

## 2015-11-03 DIAGNOSIS — F329 Major depressive disorder, single episode, unspecified: Secondary | ICD-10-CM | POA: Diagnosis not present

## 2015-11-03 NOTE — Progress Notes (Signed)
Patient had her lumpectomy surgery on 10/06/15 and was later admitted that night with COPD exacerbation.

## 2015-11-04 ENCOUNTER — Telehealth: Payer: Self-pay

## 2015-11-04 NOTE — Telephone Encounter (Signed)
  Oncology Nurse Navigator Documentation    Navigator Encounter Type: Telephone (11/04/15 1200) Patient Visit Type: Follow-up (11/04/15 1200)                    Time Spent with Patient: 30 (11/04/15 1200)   Patient states she is doing much better. Had questions about Home Health visits.  States she is confused by multiple calls scheduling appointments. Encouraged her to call RN managing her case to see if process can be improved.

## 2015-11-14 NOTE — Progress Notes (Signed)
[facility]  [phone] [phone]  ID: ARIYANA FAW OB: February 14, 1946  MR#: [id number]  KGU#:[id number]  Patient Care Team: Renee Rival, NP as PCP - General (Nurse Practitioner)  CHIEF COMPLAINT:  Chief Complaint  Patient presents with  . Breast Cancer    INTERVAL HISTORY: Patient returns to clinic today for further evaluation and discussion of her final pathology results. She was recently admitted to the hospital for a COPD exacerbation, but otherwise has felt well. She continues to be anxious. She has no neurologic complaints. She denies any pain. She has a good appetite and denies weight loss. She has no chest pain or shortness of breath. She denies any nausea, vomiting, constipation, or diarrhea. She has no urinary complaints. Patient offers no further specific complaints today.  REVIEW OF SYSTEMS:   Review of Systems  Constitutional: Negative.  Negative for fever and malaise/fatigue.  Respiratory: Positive for shortness of breath. Negative for cough and wheezing.   Cardiovascular: Negative.   Gastrointestinal: Negative.   Musculoskeletal: Negative.   Neurological: Negative.  Negative for weakness.    As per HPI. Otherwise, a complete review of systems is negatve.  PAST MEDICAL HISTORY: Past Medical History  Diagnosis Date  . Hyperlipidemia   . Hypertension   . Diabetes mellitus type 2, uncomplicated ([place])   . Hypothyroidism   . Psoriasis   . Depression   . COPD (chronic obstructive pulmonary disease) ([place])   . Osteoporosis, post-menopausal   . Vitamin D deficiency   . Sleep apnea   . Carpal tunnel syndrome     PAST SURGICAL HISTORY: Past Surgical History  Procedure Laterality Date  . Tubal ligation    . Right heel spur removed    . Carpal tunnel Right 1995  . Thumb arthroscopy Left 2000    release of a nerve to the thumb.  . Breast excisional biopsy Left 10/06/2015    np for surgery lumpectomy  . Breast  lumpectomy with needle localization Left 10/06/2015    Procedure: BREAST LUMPECTOMY WITH NEEDLE LOCALIZATION;  Surgeon: Hubbard Robinson, MD;  Location: [facility] ORS;  Service: General;  Laterality: Left;  . Sentinel node biopsy Left 10/06/2015    Procedure: SENTINEL NODE BIOPSY;  Surgeon: Hubbard Robinson, MD;  Location: [facility] ORS;  Service: General;  Laterality: Left;    FAMILY HISTORY Family History  Problem Relation Age of Onset  . Diabetes Mother   . Thyroid disease Mother   . COPD Father   . Emphysema Father   . Heart disease Brother   . Diabetes Brother   . Heart disease Brother   . Thyroid disease Daughter   . Thyroid disease Son        ADVANCED DIRECTIVES:    HEALTH MAINTENANCE: Social History  Substance Use Topics  . Smoking status: Former Smoker -- 0.00 packs/day for 30 years    Types: Cigarettes    Quit date: 10/05/2015  . Smokeless tobacco: Never Used  . Alcohol Use: No     Colonoscopy:  PAP:  Bone density:  Lipid panel:  Allergies  Allergen Reactions  . Other Hives and Rash    Shellfish. Shellfish Pt reports no allergic reaction to iodine or betadine.    Current Outpatient Prescriptions  Medication Sig Dispense Refill  . Adalimumab (HUMIRA PEN-PSORIASIS STARTER) 40 MG/0.8ML PNKT Inject 40 mg into the skin every 14 (fourteen) days.     Marland Kitchen albuterol (PROAIR HFA) 108 (90 BASE) MCG/ACT inhaler INHALE 2 PUFFS BY  MOUTH every 4 hours as needed.    Marland Kitchen albuterol (PROVENTIL) (2.5 MG/3ML) 0.083% nebulizer solution Inhale 1 vial using nebulizer every six hours as needed    . aspirin EC 81 MG tablet Take by mouth every morning.     . budesonide-formoterol (SYMBICORT) 160-4.5 MCG/ACT inhaler INHALE 2 PUFFS BY MOUTH TWICE DAILY    . buPROPion (WELLBUTRIN XL) 150 MG 24 hr tablet Take by mouth every morning.     . citalopram (CELEXA) 40 MG tablet once daily in am    . furosemide (LASIX) 20 MG tablet 2 (two) times daily.    Marland Kitchen glipiZIDE (GLUCOTROL) 5 MG tablet Take 1  tablet by mouth daily before lunch.     . insulin aspart (NOVOLOG) 100 UNIT/ML injection 38-48 units subcutaneously three a day    . insulin detemir (LEVEMIR) 100 UNIT/ML injection Inject 85 units subcutaneously twice a day    . levothyroxine (SYNTHROID, LEVOTHROID) 200 MCG tablet Take by mouth daily before breakfast.     . levothyroxine (SYNTHROID, LEVOTHROID) 50 MCG tablet Take by mouth daily before breakfast.     . losartan-hydrochlorothiazide (HYZAAR) 100-25 MG tablet Take by mouth. In am.    . metFORMIN (GLUCOPHAGE-XR) 500 MG 24 hr tablet Take 500 mg by mouth 2 (two) times daily.     . potassium chloride SA (K-DUR,KLOR-CON) 20 MEQ tablet Take 2 tablets (40 mEq total) by mouth 2 (two) times daily. 20 tablet 0  . simvastatin (ZOCOR) 40 MG tablet Take by mouth at bedtime.     Marland Kitchen tiotropium (SPIRIVA HANDIHALER) 18 MCG inhalation capsule INHALE 1 CAPSULE AS DIRECTED ONCE DAILY in am.    . vitamin E 400 UNIT capsule Take by mouth every morning.     Marland Kitchen levofloxacin (LEVAQUIN) 500 MG tablet Take 1 tablet (500 mg total) by mouth daily. (Patient not taking: Reported on 11/03/2015) 5 tablet 0  . predniSONE (DELTASONE) 10 MG tablet Take 3 tablets (30 mg total) by mouth daily with breakfast. (Patient not taking: Reported on 11/03/2015) 2 tablet 0  . predniSONE (DELTASONE) 10 MG tablet Take 1 tablet (10 mg total) by mouth daily with breakfast. (Patient not taking: Reported on 11/03/2015) 2 tablet 0  . predniSONE (DELTASONE) 20 MG tablet Take 1 tablet (20 mg total) by mouth daily with breakfast. (Patient not taking: Reported on 11/03/2015) 2 tablet 0   No current facility-administered medications for this visit.    OBJECTIVE: Filed Vitals:   11/03/15 1418  BP: 135/73  Pulse: 103  Temp: 96.1 F (35.6 C)  Resp: 20     Body mass index is 41.72 kg/(m^2).    ECOG FS:0 - Asymptomatic  General: Well-developed, well-nourished, no acute distress. Eyes: Pink conjunctiva, anicteric sclera. Breasts: Patient  requested exam be deferred today. Lungs: Scattered wheezing.  Heart: Regular rate and rhythm. No rubs, murmurs, or gallops. Abdomen: Soft, nontender, nondistended. No organomegaly noted, normoactive bowel sounds. Musculoskeletal: No edema, cyanosis, or clubbing. Neuro: Alert, answering all questions appropriately. Cranial nerves grossly intact. Skin: No rashes or petechiae noted. Psych: Normal affect.    LAB RESULTS:  Lab Results  Component Value Date   NA 141 10/12/2015   K 3.2* 10/12/2015   CL 99* 10/12/2015   CO2 34* 10/12/2015   GLUCOSE 105* 10/12/2015   BUN 19 10/12/2015   CREATININE 0.57 10/12/2015   CALCIUM 9.8 10/12/2015   PROT 6.9 06/03/2013   ALBUMIN 3.3* 06/03/2013   AST 69* 06/03/2013   ALT 50 06/03/2013   ALKPHOS  60 06/03/2013   BILITOT 0.3 06/03/2013   GFRNONAA >60 10/12/2015   GFRAA >60 10/12/2015    Lab Results  Component Value Date   WBC 10.6 10/12/2015   NEUTROABS 8.2* 10/01/2015   HGB 13.1 10/12/2015   HCT 39.9 10/12/2015   MCV 93.5 10/12/2015   PLT 252 10/12/2015     STUDIES: No results found.  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  PLAN:    1. Breast cancer: Given the size of patient's primary tumor on final pathology, she will not require adjuvant chemotherapy. Patient has consultation with radiation oncology in the next 1-2 weeks to pursue adjuvant XRT. Patient will follow-up in 2 months near the conclusion of her XRT for further evaluation and initiation of an aromatase inhibitor. Patient will take letrozole for total 5 years. She will also require a baseline bone mineral density at that time. 2. Psoriasis: Patient currently takes Humira under the direction of Dr. Evorn Gong. 3. COPD exacerbation: Continue treatment per pulmonary and primary care.  Approximately 30 minutes was spent in discussion of which greater than 50% was consultation.  Patient expressed  understanding and was in agreement with this plan. She also understands that She can call clinic at any time with any questions, concerns, or complaints.    Lloyd Huger, MD   11/14/2015 9:49 AM

## 2015-11-18 ENCOUNTER — Encounter: Payer: Self-pay | Admitting: Radiation Oncology

## 2015-11-18 ENCOUNTER — Ambulatory Visit
Admission: RE | Admit: 2015-11-18 | Discharge: 2015-11-18 | Disposition: A | Discharge status: Home / Self Care | Payer: Medicare Other | Source: Ambulatory Visit | Attending: Radiation Oncology | Admitting: Radiation Oncology

## 2015-11-18 VITALS — BP 134/64 | HR 98 | Temp 96.5°F | Resp 18 | Wt 238.9 lb

## 2015-11-18 DIAGNOSIS — C50912 Malignant neoplasm of unspecified site of left female breast: Secondary | ICD-10-CM

## 2015-11-18 DIAGNOSIS — Z510 Encounter for antineoplastic radiation therapy: Secondary | ICD-10-CM | POA: Insufficient documentation

## 2015-11-18 DIAGNOSIS — Z170 Estrogen receptor positive status [ER+]: Secondary | ICD-10-CM | POA: Insufficient documentation

## 2015-11-18 NOTE — Consult Note (Signed)
Except an outstanding is perfect of Radiation Oncology NEW PATIENT EVALUATION  Name: Ebony [name]  MRN: [id number]  Date:   11/18/2015     DOB: 02/17/1946   This 70 y.o. 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  REFERRING PHYSICIAN: Renee Rival, NP  CHIEF COMPLAINT:  Chief Complaint  Patient presents with  . Breast Cancer    Initial Consult    DIAGNOSIS: The encounter diagnosis was Malignant neoplasm of left female breast, unspecified site of breast ([place]).   PREVIOUS INVESTIGATIONS:  Mammograms and ultrasound reviewed Pathology report reviewed Clinical notes reviewed  HPI: Patient is a pleasant 70 year old female with multiple comorbidities including hypertension adult onset diabetes COPD. She was found to have on imaging studies a 5 mm lesion at the 11:00 position of the left breast 7 cm from the nipple irregular concerning for malignancy. She went on to have a needle localization which was positive for invasive mammary carcinoma. She went on to have a wide local excision and sentinel node biopsy showing a 0.5 cm grade 1 invasive mammary carcinoma with margins clear at 10 mm. 3 sentinel lymph nodes were negative for malignancy. Tumor was overall grade 1 lymph vascular invasion not identified. She had some comp occasions from narcotics after her surgery requiring another hospitalization she is recovering now slowly. She specifically denies breast tenderness cough or bone pain. Patient lives in [place] making daily transportation difficult. She seen today for consideration of adjuvant radiation therapy.  PLANNED TREATMENT REGIMEN: Whole breast radiation  PAST MEDICAL HISTORY:  has a past medical history of Hyperlipidemia; Hypertension; Diabetes mellitus type 2, uncomplicated ([place]);  Hypothyroidism; Psoriasis; Depression; COPD (chronic obstructive pulmonary disease) ([place]); Osteoporosis, post-menopausal; Vitamin D deficiency; Sleep apnea; and Carpal tunnel syndrome.    PAST SURGICAL HISTORY:  Past Surgical History  Procedure Laterality Date  . Tubal ligation    . Right heel spur removed    . Carpal tunnel Right 1995  . Thumb arthroscopy Left 2000    release of a nerve to the thumb.  . Breast excisional biopsy Left 10/06/2015    np for surgery lumpectomy  . Breast lumpectomy with needle localization Left 10/06/2015    Procedure: BREAST LUMPECTOMY WITH NEEDLE LOCALIZATION;  Surgeon: Hubbard Robinson, MD;  Location: [facility] ORS;  Service: General;  Laterality: Left;  . Sentinel node biopsy Left 10/06/2015    Procedure: SENTINEL NODE BIOPSY;  Surgeon: Hubbard Robinson, MD;  Location: [facility] ORS;  Service: General;  Laterality: Left;    FAMILY HISTORY: family history includes COPD in her father; Diabetes in her brother and mother; Emphysema in her father; Heart disease in her brother and brother; Thyroid disease in her daughter, mother, and son.  SOCIAL HISTORY:  reports that she quit smoking about 6 weeks ago. Her smoking use included Cigarettes. She smoked 0.00 packs per day for 30 years. She has never used smokeless tobacco. She reports that she does not drink alcohol or use illicit drugs.  ALLERGIES: Other  MEDICATIONS:  Current Outpatient Prescriptions  Medication Sig Dispense Refill  . Adalimumab (HUMIRA PEN-PSORIASIS STARTER) 40 MG/0.8ML PNKT Inject 40 mg into the skin every 14 (fourteen) days.     Marland Kitchen albuterol (PROAIR HFA) 108 (90 BASE) MCG/ACT inhaler INHALE 2 PUFFS BY MOUTH every 4 hours as needed.    Marland Kitchen  albuterol (PROVENTIL) (2.5 MG/3ML) 0.083% nebulizer solution Inhale 1 vial using nebulizer every six hours as needed    . aspirin EC 81 MG tablet Take by mouth every morning.     . budesonide-formoterol (SYMBICORT) 160-4.5 MCG/ACT inhaler INHALE 2 PUFFS BY MOUTH  TWICE DAILY    . buPROPion (WELLBUTRIN XL) 150 MG 24 hr tablet Take by mouth every morning.     . citalopram (CELEXA) 40 MG tablet once daily in am    . furosemide (LASIX) 20 MG tablet 2 (two) times daily.    Marland Kitchen glipiZIDE (GLUCOTROL) 5 MG tablet Take 1 tablet by mouth daily before lunch.     . insulin aspart (NOVOLOG) 100 UNIT/ML injection 38-48 units subcutaneously three a day    . insulin detemir (LEVEMIR) 100 UNIT/ML injection Inject 85 units subcutaneously twice a day    . levofloxacin (LEVAQUIN) 500 MG tablet Take 1 tablet (500 mg total) by mouth daily. (Patient not taking: Reported on 11/03/2015) 5 tablet 0  . levothyroxine (SYNTHROID, LEVOTHROID) 200 MCG tablet Take by mouth daily before breakfast.     . levothyroxine (SYNTHROID, LEVOTHROID) 50 MCG tablet Take by mouth daily before breakfast.     . losartan-hydrochlorothiazide (HYZAAR) 100-25 MG tablet Take by mouth. In am.    . metFORMIN (GLUCOPHAGE-XR) 500 MG 24 hr tablet Take 500 mg by mouth 2 (two) times daily.     . potassium chloride SA (K-DUR,KLOR-CON) 20 MEQ tablet Take 2 tablets (40 mEq total) by mouth 2 (two) times daily. 20 tablet 0  . predniSONE (DELTASONE) 10 MG tablet Take 3 tablets (30 mg total) by mouth daily with breakfast. (Patient not taking: Reported on 11/03/2015) 2 tablet 0  . predniSONE (DELTASONE) 10 MG tablet Take 1 tablet (10 mg total) by mouth daily with breakfast. (Patient not taking: Reported on 11/03/2015) 2 tablet 0  . predniSONE (DELTASONE) 20 MG tablet Take 1 tablet (20 mg total) by mouth daily with breakfast. (Patient not taking: Reported on 11/03/2015) 2 tablet 0  . simvastatin (ZOCOR) 40 MG tablet Take by mouth at bedtime.     Marland Kitchen tiotropium (SPIRIVA HANDIHALER) 18 MCG inhalation capsule INHALE 1 CAPSULE AS DIRECTED ONCE DAILY in am.    . vitamin E 400 UNIT capsule Take by mouth every morning.      No current facility-administered medications for this encounter.    ECOG PERFORMANCE STATUS:  0 -  Asymptomatic  REVIEW OF SYSTEMS:  Patient denies any weight loss, fatigue, weakness, fever, chills or night sweats. Patient denies any loss of vision, blurred vision. Patient denies any ringing  of the ears or hearing loss. No irregular heartbeat. Patient denies heart murmur or history of fainting. Patient denies any chest pain or pain radiating to her upper extremities. Patient denies any shortness of breath, difficulty breathing at night, cough or hemoptysis. Patient denies any swelling in the lower legs. Patient denies any nausea vomiting, vomiting of blood, or coffee ground material in the vomitus. Patient denies any stomach pain. Patient states has had normal bowel movements no significant constipation or diarrhea. Patient denies any dysuria, hematuria or significant nocturia. Patient denies any problems walking, swelling in the joints or loss of balance. Patient denies any skin changes, loss of hair or loss of weight. Patient denies any excessive worrying or anxiety or significant depression. Patient denies any problems with insomnia. Patient denies excessive thirst, polyuria, polydipsia. Patient denies any swollen glands, patient denies easy bruising or easy bleeding. Patient denies any recent infections, allergies  or URI. Patient "s visual fields have not changed significantly in recent time.    PHYSICAL EXAM: BP 134/64 mmHg  Pulse 98  Temp(Src) 96.5 F (35.8 C)  Resp 18  Wt 238 lb 13.9 oz (108.35 kg) Well-developed female appears older than stated age. She has moderate obesity. She has large pendulous breasts. She status post wide local excision of the left breast which is healing well no dominant mass or nodularity is noted in either breast in 2 positions examined. No axillary or supraclavicular adenopathy is identified. Well-developed well-nourished patient in NAD. HEENT reveals PERLA, EOMI, discs not visualized.  Oral cavity is clear. No oral mucosal lesions are identified. Neck is clear  without evidence of cervical or supraclavicular adenopathy. Lungs are clear to A&P. Cardiac examination is essentially unremarkable with regular rate and rhythm without murmur rub or thrill. Abdomen is benign with no organomegaly or masses noted. Motor sensory and DTR levels are equal and symmetric in the upper and lower extremities. Cranial nerves II through XII are grossly intact. Proprioception is intact. No peripheral adenopathy or edema is identified. No motor or sensory levels are noted. Crude visual fields are within normal range.  LABORATORY DATA: Pathology reports reviewed    RADIOLOGY RESULTS: Mammograms and ultrasound reviewed   IMPRESSION: Stage I a invasive mammary carcinoma left breast as was wide local excision and sentinel node biopsy ER/PR positive tumor in 70 year old female.  PLAN: At this time I recommended adjuvant radiation therapy to her left breast. We'll treat her breast to 5000 cGy over 5 weeks. Based on the margin status do not feel she needs a boost. I have set up and ordered CT simulation on the patient early next week. Patient is on disability with her husband and we will try to provide gas vouchers for the patient over her course of treatment. Risks and benefits of treatment including skin reaction fatigue alteration of blood counts possible inclusion of superficial lung all were explained in detail to the patient. I've also explained we may be able to get by with just aromatase inhibitor therapy in this patient without radiation and I've explained the CLA GB results. Patient would like to try to go ahead with radiation therapy as planned since she is only 70 years of age.  I would like to take this opportunity for allowing me to participate in the care of your patient.Armstead Peaks., MD

## 2015-11-18 NOTE — Addendum Note (Signed)
Encounter addended by: Coral Spikes, Rad Tech on: 11/18/2015  1:13 PM<BR>     Documentation filed: Charges VN

## 2015-11-22 ENCOUNTER — Ambulatory Visit: Payer: Medicare Other

## 2015-11-24 ENCOUNTER — Ambulatory Visit
Admission: RE | Admit: 2015-11-24 | Discharge: 2015-11-24 | Disposition: A | Discharge status: Home / Self Care | Payer: Medicare Other | Source: Ambulatory Visit | Attending: Radiation Oncology | Admitting: Radiation Oncology

## 2015-11-24 DIAGNOSIS — Z170 Estrogen receptor positive status [ER+]: Secondary | ICD-10-CM | POA: Diagnosis not present

## 2015-11-24 DIAGNOSIS — Z510 Encounter for antineoplastic radiation therapy: Secondary | ICD-10-CM | POA: Diagnosis not present

## 2015-11-24 DIAGNOSIS — C50912 Malignant neoplasm of unspecified site of left female breast: Secondary | ICD-10-CM | POA: Diagnosis not present

## 2015-11-26 ENCOUNTER — Other Ambulatory Visit: Payer: Self-pay | Admitting: *Deleted

## 2015-11-26 DIAGNOSIS — C50212 Malignant neoplasm of upper-inner quadrant of left female breast: Secondary | ICD-10-CM

## 2015-11-29 DIAGNOSIS — C50912 Malignant neoplasm of unspecified site of left female breast: Secondary | ICD-10-CM | POA: Diagnosis not present

## 2015-12-01 ENCOUNTER — Ambulatory Visit: Payer: Medicare Other

## 2015-12-01 ENCOUNTER — Ambulatory Visit
Admission: RE | Admit: 2015-12-01 | Discharge: 2015-12-01 | Disposition: A | Discharge status: Home / Self Care | Payer: Medicare Other | Source: Ambulatory Visit | Attending: Radiation Oncology | Admitting: Radiation Oncology

## 2015-12-01 DIAGNOSIS — C50912 Malignant neoplasm of unspecified site of left female breast: Secondary | ICD-10-CM | POA: Diagnosis not present

## 2015-12-02 ENCOUNTER — Ambulatory Visit: Payer: Medicare Other

## 2015-12-02 DIAGNOSIS — C50912 Malignant neoplasm of unspecified site of left female breast: Secondary | ICD-10-CM | POA: Diagnosis not present

## 2015-12-03 ENCOUNTER — Ambulatory Visit: Payer: Medicare Other

## 2015-12-06 ENCOUNTER — Ambulatory Visit
Admission: RE | Admit: 2015-12-06 | Discharge: 2015-12-06 | Disposition: A | Discharge status: Home / Self Care | Payer: Medicare Other | Source: Ambulatory Visit | Attending: Radiation Oncology | Admitting: Radiation Oncology

## 2015-12-06 ENCOUNTER — Ambulatory Visit: Payer: Medicare Other

## 2015-12-06 DIAGNOSIS — C50912 Malignant neoplasm of unspecified site of left female breast: Secondary | ICD-10-CM | POA: Diagnosis not present

## 2015-12-07 ENCOUNTER — Ambulatory Visit
Admission: RE | Admit: 2015-12-07 | Discharge: 2015-12-07 | Disposition: A | Discharge status: Home / Self Care | Payer: Medicare Other | Source: Ambulatory Visit | Attending: Radiation Oncology | Admitting: Radiation Oncology

## 2015-12-07 DIAGNOSIS — C50912 Malignant neoplasm of unspecified site of left female breast: Secondary | ICD-10-CM | POA: Diagnosis not present

## 2015-12-08 ENCOUNTER — Ambulatory Visit: Payer: Medicare Other

## 2015-12-09 ENCOUNTER — Ambulatory Visit: Payer: Medicare Other

## 2015-12-10 ENCOUNTER — Ambulatory Visit: Payer: Medicare Other

## 2015-12-10 ENCOUNTER — Ambulatory Visit
Admission: RE | Admit: 2015-12-10 | Discharge: 2015-12-10 | Disposition: A | Discharge status: Home / Self Care | Payer: Medicare Other | Source: Ambulatory Visit | Attending: Radiation Oncology | Admitting: Radiation Oncology

## 2015-12-10 DIAGNOSIS — C50912 Malignant neoplasm of unspecified site of left female breast: Secondary | ICD-10-CM | POA: Diagnosis not present

## 2015-12-11 ENCOUNTER — Ambulatory Visit: Payer: Medicare Other

## 2015-12-13 ENCOUNTER — Ambulatory Visit: Payer: Medicare Other

## 2015-12-14 ENCOUNTER — Ambulatory Visit
Admission: RE | Admit: 2015-12-14 | Discharge: 2015-12-14 | Disposition: A | Discharge status: Home / Self Care | Payer: Medicare Other | Source: Ambulatory Visit | Attending: Radiation Oncology | Admitting: Radiation Oncology

## 2015-12-14 DIAGNOSIS — C50912 Malignant neoplasm of unspecified site of left female breast: Secondary | ICD-10-CM | POA: Diagnosis not present

## 2015-12-15 ENCOUNTER — Ambulatory Visit
Admission: RE | Admit: 2015-12-15 | Discharge: 2015-12-15 | Disposition: A | Discharge status: Home / Self Care | Payer: Medicare Other | Source: Ambulatory Visit | Attending: Radiation Oncology | Admitting: Radiation Oncology

## 2015-12-15 DIAGNOSIS — C50912 Malignant neoplasm of unspecified site of left female breast: Secondary | ICD-10-CM | POA: Diagnosis not present

## 2015-12-16 ENCOUNTER — Inpatient Hospital Stay: Payer: Medicare Other | Attending: Oncology

## 2015-12-16 ENCOUNTER — Ambulatory Visit
Admission: RE | Admit: 2015-12-16 | Discharge: 2015-12-16 | Disposition: A | Discharge status: Home / Self Care | Payer: Medicare Other | Source: Ambulatory Visit | Attending: Radiation Oncology | Admitting: Radiation Oncology

## 2015-12-16 DIAGNOSIS — E039 Hypothyroidism, unspecified: Secondary | ICD-10-CM | POA: Diagnosis not present

## 2015-12-16 DIAGNOSIS — E559 Vitamin D deficiency, unspecified: Secondary | ICD-10-CM | POA: Insufficient documentation

## 2015-12-16 DIAGNOSIS — G4730 Sleep apnea, unspecified: Secondary | ICD-10-CM | POA: Diagnosis not present

## 2015-12-16 DIAGNOSIS — L409 Psoriasis, unspecified: Secondary | ICD-10-CM | POA: Insufficient documentation

## 2015-12-16 DIAGNOSIS — I10 Essential (primary) hypertension: Secondary | ICD-10-CM | POA: Diagnosis not present

## 2015-12-16 DIAGNOSIS — Z7982 Long term (current) use of aspirin: Secondary | ICD-10-CM | POA: Diagnosis not present

## 2015-12-16 DIAGNOSIS — G5600 Carpal tunnel syndrome, unspecified upper limb: Secondary | ICD-10-CM | POA: Insufficient documentation

## 2015-12-16 DIAGNOSIS — Z87891 Personal history of nicotine dependence: Secondary | ICD-10-CM | POA: Diagnosis not present

## 2015-12-16 DIAGNOSIS — C50212 Malignant neoplasm of upper-inner quadrant of left female breast: Secondary | ICD-10-CM

## 2015-12-16 DIAGNOSIS — J441 Chronic obstructive pulmonary disease with (acute) exacerbation: Secondary | ICD-10-CM | POA: Diagnosis not present

## 2015-12-16 DIAGNOSIS — E119 Type 2 diabetes mellitus without complications: Secondary | ICD-10-CM | POA: Diagnosis not present

## 2015-12-16 DIAGNOSIS — F329 Major depressive disorder, single episode, unspecified: Secondary | ICD-10-CM | POA: Insufficient documentation

## 2015-12-16 DIAGNOSIS — C50912 Malignant neoplasm of unspecified site of left female breast: Secondary | ICD-10-CM | POA: Diagnosis not present

## 2015-12-16 DIAGNOSIS — Z170 Estrogen receptor positive status [ER+]: Secondary | ICD-10-CM | POA: Diagnosis not present

## 2015-12-16 DIAGNOSIS — E785 Hyperlipidemia, unspecified: Secondary | ICD-10-CM | POA: Insufficient documentation

## 2015-12-16 DIAGNOSIS — Z7984 Long term (current) use of oral hypoglycemic drugs: Secondary | ICD-10-CM | POA: Diagnosis not present

## 2015-12-16 DIAGNOSIS — Z79811 Long term (current) use of aromatase inhibitors: Secondary | ICD-10-CM | POA: Insufficient documentation

## 2015-12-16 DIAGNOSIS — C50412 Malignant neoplasm of upper-outer quadrant of left female breast: Secondary | ICD-10-CM | POA: Insufficient documentation

## 2015-12-16 LAB — CBC
HCT: 46.2 % (ref 35.0–47.0)
Hemoglobin: 15.3 g/dL (ref 12.0–16.0)
MCH: 30.8 pg (ref 26.0–34.0)
MCHC: 33.1 g/dL (ref 32.0–36.0)
MCV: 93.1 fL (ref 80.0–100.0)
PLATELETS: 322 10*3/uL (ref 150–440)
RBC: 4.97 MIL/uL (ref 3.80–5.20)
RDW: 14.7 % — ABNORMAL HIGH (ref 11.5–14.5)
WBC: 11.7 10*3/uL — AB (ref 3.6–11.0)

## 2015-12-17 ENCOUNTER — Ambulatory Visit
Admission: RE | Admit: 2015-12-17 | Discharge: 2015-12-17 | Disposition: A | Discharge status: Home / Self Care | Payer: Medicare Other | Source: Ambulatory Visit | Attending: Radiation Oncology | Admitting: Radiation Oncology

## 2015-12-17 DIAGNOSIS — C50912 Malignant neoplasm of unspecified site of left female breast: Secondary | ICD-10-CM | POA: Diagnosis not present

## 2015-12-20 ENCOUNTER — Ambulatory Visit: Payer: Medicare Other

## 2015-12-21 ENCOUNTER — Ambulatory Visit
Admission: RE | Admit: 2015-12-21 | Discharge: 2015-12-21 | Disposition: A | Discharge status: Home / Self Care | Payer: Medicare Other | Source: Ambulatory Visit | Attending: Radiation Oncology | Admitting: Radiation Oncology

## 2015-12-21 DIAGNOSIS — C50912 Malignant neoplasm of unspecified site of left female breast: Secondary | ICD-10-CM | POA: Diagnosis not present

## 2015-12-22 ENCOUNTER — Ambulatory Visit
Admission: RE | Admit: 2015-12-22 | Discharge: 2015-12-22 | Disposition: A | Discharge status: Home / Self Care | Payer: Medicare Other | Source: Ambulatory Visit | Attending: Radiation Oncology | Admitting: Radiation Oncology

## 2015-12-22 DIAGNOSIS — C50912 Malignant neoplasm of unspecified site of left female breast: Secondary | ICD-10-CM | POA: Diagnosis not present

## 2015-12-23 ENCOUNTER — Ambulatory Visit
Admission: RE | Admit: 2015-12-23 | Discharge: 2015-12-23 | Disposition: A | Discharge status: Home / Self Care | Payer: Medicare Other | Source: Ambulatory Visit | Attending: Radiation Oncology | Admitting: Radiation Oncology

## 2015-12-23 DIAGNOSIS — C50912 Malignant neoplasm of unspecified site of left female breast: Secondary | ICD-10-CM | POA: Diagnosis not present

## 2015-12-24 ENCOUNTER — Ambulatory Visit
Admission: RE | Admit: 2015-12-24 | Discharge: 2015-12-24 | Disposition: A | Discharge status: Home / Self Care | Payer: Medicare Other | Source: Ambulatory Visit | Attending: Radiation Oncology | Admitting: Radiation Oncology

## 2015-12-24 DIAGNOSIS — C50912 Malignant neoplasm of unspecified site of left female breast: Secondary | ICD-10-CM | POA: Diagnosis not present

## 2015-12-27 ENCOUNTER — Ambulatory Visit
Admission: RE | Admit: 2015-12-27 | Discharge: 2015-12-27 | Disposition: A | Discharge status: Home / Self Care | Payer: Medicare Other | Source: Ambulatory Visit | Attending: Radiation Oncology | Admitting: Radiation Oncology

## 2015-12-27 DIAGNOSIS — C50912 Malignant neoplasm of unspecified site of left female breast: Secondary | ICD-10-CM | POA: Diagnosis not present

## 2015-12-28 ENCOUNTER — Ambulatory Visit
Admission: RE | Admit: 2015-12-28 | Discharge: 2015-12-28 | Disposition: A | Discharge status: Home / Self Care | Payer: Medicare Other | Source: Ambulatory Visit | Attending: Radiation Oncology | Admitting: Radiation Oncology

## 2015-12-28 DIAGNOSIS — C50912 Malignant neoplasm of unspecified site of left female breast: Secondary | ICD-10-CM | POA: Diagnosis not present

## 2015-12-29 ENCOUNTER — Ambulatory Visit
Admission: RE | Admit: 2015-12-29 | Discharge: 2015-12-29 | Disposition: A | Discharge status: Home / Self Care | Payer: Medicare Other | Source: Ambulatory Visit | Attending: Radiation Oncology | Admitting: Radiation Oncology

## 2015-12-29 DIAGNOSIS — C50912 Malignant neoplasm of unspecified site of left female breast: Secondary | ICD-10-CM | POA: Diagnosis not present

## 2015-12-30 ENCOUNTER — Ambulatory Visit
Admission: RE | Admit: 2015-12-30 | Discharge: 2015-12-30 | Disposition: A | Discharge status: Home / Self Care | Payer: Medicare Other | Source: Ambulatory Visit | Attending: Radiation Oncology | Admitting: Radiation Oncology

## 2015-12-30 ENCOUNTER — Inpatient Hospital Stay: Payer: Medicare Other

## 2015-12-30 DIAGNOSIS — C50212 Malignant neoplasm of upper-inner quadrant of left female breast: Secondary | ICD-10-CM

## 2015-12-30 DIAGNOSIS — C50412 Malignant neoplasm of upper-outer quadrant of left female breast: Secondary | ICD-10-CM | POA: Diagnosis not present

## 2015-12-30 DIAGNOSIS — C50912 Malignant neoplasm of unspecified site of left female breast: Secondary | ICD-10-CM | POA: Diagnosis not present

## 2015-12-30 LAB — CBC
HEMATOCRIT: 45.7 % (ref 35.0–47.0)
HEMOGLOBIN: 15.2 g/dL (ref 12.0–16.0)
MCH: 30.7 pg (ref 26.0–34.0)
MCHC: 33.4 g/dL (ref 32.0–36.0)
MCV: 91.9 fL (ref 80.0–100.0)
Platelets: 338 10*3/uL (ref 150–440)
RBC: 4.97 MIL/uL (ref 3.80–5.20)
RDW: 14 % (ref 11.5–14.5)
WBC: 12.4 10*3/uL — ABNORMAL HIGH (ref 3.6–11.0)

## 2015-12-31 ENCOUNTER — Ambulatory Visit
Admission: RE | Admit: 2015-12-31 | Discharge: 2015-12-31 | Disposition: A | Discharge status: Home / Self Care | Payer: Medicare Other | Source: Ambulatory Visit | Attending: Radiation Oncology | Admitting: Radiation Oncology

## 2015-12-31 DIAGNOSIS — C50912 Malignant neoplasm of unspecified site of left female breast: Secondary | ICD-10-CM | POA: Diagnosis not present

## 2016-01-03 ENCOUNTER — Ambulatory Visit
Admission: RE | Admit: 2016-01-03 | Discharge: 2016-01-03 | Disposition: A | Discharge status: Home / Self Care | Payer: Medicare Other | Source: Ambulatory Visit | Attending: Radiation Oncology | Admitting: Radiation Oncology

## 2016-01-03 DIAGNOSIS — C50912 Malignant neoplasm of unspecified site of left female breast: Secondary | ICD-10-CM | POA: Diagnosis not present

## 2016-01-04 ENCOUNTER — Ambulatory Visit
Admission: RE | Admit: 2016-01-04 | Discharge: 2016-01-04 | Disposition: A | Discharge status: Home / Self Care | Payer: Medicare Other | Source: Ambulatory Visit | Attending: Radiation Oncology | Admitting: Radiation Oncology

## 2016-01-04 DIAGNOSIS — C50912 Malignant neoplasm of unspecified site of left female breast: Secondary | ICD-10-CM | POA: Diagnosis not present

## 2016-01-05 ENCOUNTER — Ambulatory Visit: Payer: Medicare Other

## 2016-01-06 ENCOUNTER — Ambulatory Visit
Admission: RE | Admit: 2016-01-06 | Discharge: 2016-01-06 | Disposition: A | Discharge status: Home / Self Care | Payer: Medicare Other | Source: Ambulatory Visit | Attending: Radiation Oncology | Admitting: Radiation Oncology

## 2016-01-06 DIAGNOSIS — C50912 Malignant neoplasm of unspecified site of left female breast: Secondary | ICD-10-CM | POA: Diagnosis not present

## 2016-01-06 NOTE — Progress Notes (Signed)
Patient almost through radiation treatments.  Has follow-up appointment with Dr. Grayland Ormond on 01/10/16.  Mailed card.

## 2016-01-07 ENCOUNTER — Ambulatory Visit
Admission: RE | Admit: 2016-01-07 | Discharge: 2016-01-07 | Disposition: A | Discharge status: Home / Self Care | Payer: Medicare Other | Source: Ambulatory Visit | Attending: Radiation Oncology | Admitting: Radiation Oncology

## 2016-01-07 DIAGNOSIS — C50912 Malignant neoplasm of unspecified site of left female breast: Secondary | ICD-10-CM | POA: Diagnosis not present

## 2016-01-10 ENCOUNTER — Ambulatory Visit: Payer: Medicare Other

## 2016-01-10 ENCOUNTER — Inpatient Hospital Stay (HOSPITAL_BASED_OUTPATIENT_CLINIC_OR_DEPARTMENT_OTHER): Payer: Medicare Other | Admitting: Oncology

## 2016-01-10 ENCOUNTER — Ambulatory Visit
Admission: RE | Admit: 2016-01-10 | Discharge: 2016-01-10 | Disposition: A | Discharge status: Home / Self Care | Payer: Medicare Other | Source: Ambulatory Visit | Attending: Radiation Oncology | Admitting: Radiation Oncology

## 2016-01-10 VITALS — BP 100/61 | HR 101 | Temp 97.1°F | Resp 18 | Wt 224.6 lb

## 2016-01-10 DIAGNOSIS — E039 Hypothyroidism, unspecified: Secondary | ICD-10-CM

## 2016-01-10 DIAGNOSIS — J441 Chronic obstructive pulmonary disease with (acute) exacerbation: Secondary | ICD-10-CM

## 2016-01-10 DIAGNOSIS — L409 Psoriasis, unspecified: Secondary | ICD-10-CM | POA: Diagnosis not present

## 2016-01-10 DIAGNOSIS — C50912 Malignant neoplasm of unspecified site of left female breast: Secondary | ICD-10-CM

## 2016-01-10 DIAGNOSIS — Z87891 Personal history of nicotine dependence: Secondary | ICD-10-CM

## 2016-01-10 DIAGNOSIS — Z79811 Long term (current) use of aromatase inhibitors: Secondary | ICD-10-CM

## 2016-01-10 DIAGNOSIS — C50412 Malignant neoplasm of upper-outer quadrant of left female breast: Secondary | ICD-10-CM

## 2016-01-10 DIAGNOSIS — Z7982 Long term (current) use of aspirin: Secondary | ICD-10-CM

## 2016-01-10 DIAGNOSIS — E119 Type 2 diabetes mellitus without complications: Secondary | ICD-10-CM

## 2016-01-10 DIAGNOSIS — E785 Hyperlipidemia, unspecified: Secondary | ICD-10-CM

## 2016-01-10 DIAGNOSIS — F329 Major depressive disorder, single episode, unspecified: Secondary | ICD-10-CM

## 2016-01-10 DIAGNOSIS — Z7984 Long term (current) use of oral hypoglycemic drugs: Secondary | ICD-10-CM

## 2016-01-10 DIAGNOSIS — E559 Vitamin D deficiency, unspecified: Secondary | ICD-10-CM

## 2016-01-10 DIAGNOSIS — I10 Essential (primary) hypertension: Secondary | ICD-10-CM

## 2016-01-10 DIAGNOSIS — Z170 Estrogen receptor positive status [ER+]: Secondary | ICD-10-CM

## 2016-01-10 DIAGNOSIS — G4730 Sleep apnea, unspecified: Secondary | ICD-10-CM

## 2016-01-10 DIAGNOSIS — G5600 Carpal tunnel syndrome, unspecified upper limb: Secondary | ICD-10-CM

## 2016-01-10 MED ORDER — LETROZOLE 2.5 MG PO TABS: 2.5000 mg | ORAL_TABLET | Freq: Every day | ORAL | Status: DC

## 2016-01-10 NOTE — Progress Notes (Signed)
[place]  [phone] [phone]  ID: KAPRICE KAGE OB: 1946/03/17  MR#: [id number]  OZD#:[id number]  Patient Care Team: Renee Rival, NP as PCP - General (Nurse Practitioner)  CHIEF COMPLAINT:  Chief Complaint  Patient presents with  . Breast Cancer    INTERVAL HISTORY: Patient returns to clinic today for further evaluation and to initiate letrozole. She feels well today and has no complaints. She continues to be anxious. She has no neurologic complaints. She denies any pain. She has a good appetite and denies weight loss. She has no chest pain or shortness of breath. She denies any nausea, vomiting, constipation, or diarrhea. She has no urinary complaints. Patient offers no further specific complaints today.  REVIEW OF SYSTEMS:   Review of Systems  Constitutional: Negative.  Negative for fever and malaise/fatigue.  Respiratory: Negative.   Cardiovascular: Negative.   Gastrointestinal: Negative.   Musculoskeletal: Negative.   Neurological: Negative.  Negative for weakness.    As per HPI. Otherwise, a complete review of systems is negatve.  PAST MEDICAL HISTORY: Past Medical History  Diagnosis Date  . Hyperlipidemia   . Hypertension   . Diabetes mellitus type 2, uncomplicated ([place])   . Hypothyroidism   . Psoriasis   . Depression   . COPD (chronic obstructive pulmonary disease) ([place])   . Osteoporosis, post-menopausal   . Vitamin D deficiency   . Sleep apnea   . Carpal tunnel syndrome     PAST SURGICAL HISTORY: Past Surgical History  Procedure Laterality Date  . Tubal ligation    . Right heel spur removed    . Carpal tunnel Right 1995  . Thumb arthroscopy Left 2000    release of a nerve to the thumb.  . Breast excisional biopsy Left 10/06/2015    np for surgery lumpectomy  . Breast lumpectomy with needle localization Left 10/06/2015    Procedure: BREAST LUMPECTOMY WITH NEEDLE LOCALIZATION;  Surgeon: Hubbard Robinson, MD;  Location: [facility] ORS;  Service: General;  Laterality: Left;  . Sentinel node biopsy Left 10/06/2015    Procedure: SENTINEL NODE BIOPSY;  Surgeon: Hubbard Robinson, MD;  Location: [facility] ORS;  Service: General;  Laterality: Left;    FAMILY HISTORY Family History  Problem Relation Age of Onset  . Diabetes Mother   . Thyroid disease Mother   . COPD Father   . Emphysema Father   . Heart disease Brother   . Diabetes Brother   . Heart disease Brother   . Thyroid disease Daughter   . Thyroid disease Son        ADVANCED DIRECTIVES:    HEALTH MAINTENANCE: Social History  Substance Use Topics  . Smoking status: Former Smoker -- 0.00 packs/day for 30 years    Types: Cigarettes    Quit date: 10/05/2015  . Smokeless tobacco: Never Used  . Alcohol Use: No     Colonoscopy:  PAP:  Bone density:  Lipid panel:  Allergies  Allergen Reactions  . Other Hives and Rash    Shellfish. Shellfish Pt reports no allergic reaction to iodine or betadine.    Current Outpatient Prescriptions  Medication Sig Dispense Refill  . Adalimumab (HUMIRA PEN-PSORIASIS STARTER) 40 MG/0.8ML PNKT Inject 40 mg into the skin every 14 (fourteen) days.     Marland Kitchen albuterol (PROAIR HFA) 108 (90 BASE) MCG/ACT inhaler INHALE 2 PUFFS BY MOUTH every 4 hours as needed.    Marland Kitchen albuterol (PROVENTIL) (2.5 MG/3ML) 0.083% nebulizer solution Inhale 1 vial  using nebulizer every six hours as needed    . aspirin EC 81 MG tablet Take by mouth every morning.     . budesonide-formoterol (SYMBICORT) 160-4.5 MCG/ACT inhaler INHALE 2 PUFFS BY MOUTH TWICE DAILY    . buPROPion (WELLBUTRIN XL) 150 MG 24 hr tablet Take by mouth every morning.     . citalopram (CELEXA) 40 MG tablet once daily in am    . furosemide (LASIX) 20 MG tablet 2 (two) times daily.    Marland Kitchen glipiZIDE (GLUCOTROL) 5 MG tablet Take 1 tablet by mouth daily before lunch.     . insulin aspart (NOVOLOG) 100 UNIT/ML injection 38-48 units subcutaneously three a day    .  insulin detemir (LEVEMIR) 100 UNIT/ML injection Inject 85 units subcutaneously twice a day    . levofloxacin (LEVAQUIN) 500 MG tablet Take 1 tablet (500 mg total) by mouth daily. 5 tablet 0  . levothyroxine (SYNTHROID, LEVOTHROID) 200 MCG tablet Take by mouth daily before breakfast.     . levothyroxine (SYNTHROID, LEVOTHROID) 50 MCG tablet Take by mouth daily before breakfast.     . losartan-hydrochlorothiazide (HYZAAR) 100-25 MG tablet Take by mouth. In am.    . metFORMIN (GLUCOPHAGE-XR) 500 MG 24 hr tablet Take 500 mg by mouth 2 (two) times daily.     . potassium chloride SA (K-DUR,KLOR-CON) 20 MEQ tablet Take 2 tablets (40 mEq total) by mouth 2 (two) times daily. 20 tablet 0  . predniSONE (DELTASONE) 10 MG tablet Take 3 tablets (30 mg total) by mouth daily with breakfast. 2 tablet 0  . predniSONE (DELTASONE) 10 MG tablet Take 1 tablet (10 mg total) by mouth daily with breakfast. 2 tablet 0  . predniSONE (DELTASONE) 20 MG tablet Take 1 tablet (20 mg total) by mouth daily with breakfast. 2 tablet 0  . simvastatin (ZOCOR) 40 MG tablet Take by mouth at bedtime.     Marland Kitchen tiotropium (SPIRIVA HANDIHALER) 18 MCG inhalation capsule INHALE 1 CAPSULE AS DIRECTED ONCE DAILY in am.    . vitamin E 400 UNIT capsule Take by mouth every morning.     Marland Kitchen letrozole (FEMARA) 2.5 MG tablet Take 1 tablet (2.5 mg total) by mouth daily. 30 tablet 11   No current facility-administered medications for this visit.    OBJECTIVE: Filed Vitals:   01/10/16 1438  BP: 100/61  Pulse: 101  Temp: 97.1 F (36.2 C)  Resp: 18     Body mass index is 39.8 kg/(m^2).    ECOG FS:0 - Asymptomatic  General: Well-developed, well-nourished, no acute distress. Eyes: Pink conjunctiva, anicteric sclera. Breasts: Patient requested exam be deferred today. Lungs: Scattered wheezing.  Heart: Regular rate and rhythm. No rubs, murmurs, or gallops. Abdomen: Soft, nontender, nondistended. No organomegaly noted, normoactive bowel  sounds. Musculoskeletal: No edema, cyanosis, or clubbing. Neuro: Alert, answering all questions appropriately. Cranial nerves grossly intact. Skin: No rashes or petechiae noted. Psych: Normal affect.    LAB RESULTS:  Lab Results  Component Value Date   NA 141 10/12/2015   K 3.2* 10/12/2015   CL 99* 10/12/2015   CO2 34* 10/12/2015   GLUCOSE 105* 10/12/2015   BUN 19 10/12/2015   CREATININE 0.57 10/12/2015   CALCIUM 9.8 10/12/2015   PROT 6.9 06/03/2013   ALBUMIN 3.3* 06/03/2013   AST 69* 06/03/2013   ALT 50 06/03/2013   ALKPHOS 60 06/03/2013   BILITOT 0.3 06/03/2013   GFRNONAA >60 10/12/2015   GFRAA >60 10/12/2015    Lab Results  Component Value  Date   WBC 12.4* 12/30/2015   NEUTROABS 8.2* 10/01/2015   HGB 15.2 12/30/2015   HCT 45.7 12/30/2015   MCV 91.9 12/30/2015   PLT 338 12/30/2015     STUDIES: No results found.  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  PLAN:    1. Breast cancer: Given the size of patient's primary tumor on final pathology, she will not require adjuvant chemotherapy. Her last XRT will be on January 18, 2016. She will start letrozole after radiation is complete. She will continue letrozole for 5 years finishing in March 2022. She will also have a baseline bone mineral density in the next week or two. She will return to clinic for follow up in 3 months.  2. Psoriasis: Patient currently takes Humira under the direction of Dr. Evorn Gong. 3. COPD exacerbation: Continue treatment per pulmonary and primary care.  Patient expressed understanding and was in agreement with this plan. She also understands that She can call clinic at any time with any questions, concerns, or complaints.    Mayra Reel, NP   01/10/2016 3:21 PM  Patient was seen and evaluated independently and I agree with the assessment and plan as dictated above.  Lloyd Huger, MD 01/11/2016 4:57 PM

## 2016-01-11 ENCOUNTER — Ambulatory Visit: Payer: Medicare Other

## 2016-01-11 ENCOUNTER — Ambulatory Visit
Admission: RE | Admit: 2016-01-11 | Discharge: 2016-01-11 | Disposition: A | Discharge status: Home / Self Care | Payer: Medicare Other | Source: Ambulatory Visit | Attending: Radiation Oncology | Admitting: Radiation Oncology

## 2016-01-11 DIAGNOSIS — C50912 Malignant neoplasm of unspecified site of left female breast: Secondary | ICD-10-CM | POA: Diagnosis not present

## 2016-01-12 ENCOUNTER — Ambulatory Visit: Payer: Medicare Other

## 2016-01-12 DIAGNOSIS — C50912 Malignant neoplasm of unspecified site of left female breast: Secondary | ICD-10-CM | POA: Diagnosis not present

## 2016-01-13 ENCOUNTER — Inpatient Hospital Stay: Payer: Medicare Other | Attending: Oncology

## 2016-01-13 ENCOUNTER — Ambulatory Visit: Payer: Medicare Other

## 2016-01-13 ENCOUNTER — Ambulatory Visit
Admission: RE | Admit: 2016-01-13 | Discharge: 2016-01-13 | Disposition: A | Discharge status: Home / Self Care | Payer: Medicare Other | Source: Ambulatory Visit | Attending: Radiation Oncology | Admitting: Radiation Oncology

## 2016-01-13 DIAGNOSIS — C50912 Malignant neoplasm of unspecified site of left female breast: Secondary | ICD-10-CM | POA: Insufficient documentation

## 2016-01-13 DIAGNOSIS — C50212 Malignant neoplasm of upper-inner quadrant of left female breast: Secondary | ICD-10-CM

## 2016-01-13 DIAGNOSIS — C50412 Malignant neoplasm of upper-outer quadrant of left female breast: Secondary | ICD-10-CM | POA: Diagnosis present

## 2016-01-13 LAB — CBC
HCT: 44.4 % (ref 35.0–47.0)
Hemoglobin: 15 g/dL (ref 12.0–16.0)
MCH: 30.9 pg (ref 26.0–34.0)
MCHC: 33.8 g/dL (ref 32.0–36.0)
MCV: 91.4 fL (ref 80.0–100.0)
PLATELETS: 292 10*3/uL (ref 150–440)
RBC: 4.85 MIL/uL (ref 3.80–5.20)
RDW: 13.6 % (ref 11.5–14.5)
WBC: 10.6 10*3/uL (ref 3.6–11.0)

## 2016-01-14 ENCOUNTER — Other Ambulatory Visit: Payer: Self-pay

## 2016-01-14 ENCOUNTER — Ambulatory Visit
Admission: RE | Admit: 2016-01-14 | Discharge: 2016-01-14 | Disposition: A | Discharge status: Home / Self Care | Payer: Medicare Other | Source: Ambulatory Visit | Attending: Radiation Oncology | Admitting: Radiation Oncology

## 2016-01-14 ENCOUNTER — Ambulatory Visit: Payer: Medicare Other

## 2016-01-14 DIAGNOSIS — C50912 Malignant neoplasm of unspecified site of left female breast: Secondary | ICD-10-CM

## 2016-01-14 MED ORDER — LETROZOLE 2.5 MG PO TABS: 2.5000 mg | ORAL_TABLET | Freq: Every day | ORAL | Status: DC

## 2016-01-17 ENCOUNTER — Ambulatory Visit: Payer: Medicare Other

## 2016-01-17 ENCOUNTER — Ambulatory Visit
Admission: RE | Admit: 2016-01-17 | Discharge: 2016-01-17 | Disposition: A | Discharge status: Home / Self Care | Payer: Medicare Other | Source: Ambulatory Visit | Attending: Radiation Oncology | Admitting: Radiation Oncology

## 2016-01-17 DIAGNOSIS — C50912 Malignant neoplasm of unspecified site of left female breast: Secondary | ICD-10-CM | POA: Diagnosis not present

## 2016-01-18 ENCOUNTER — Ambulatory Visit
Admission: RE | Admit: 2016-01-18 | Discharge: 2016-01-18 | Disposition: A | Discharge status: Home / Self Care | Payer: Medicare Other | Source: Ambulatory Visit | Attending: Radiation Oncology | Admitting: Radiation Oncology

## 2016-01-18 DIAGNOSIS — C50912 Malignant neoplasm of unspecified site of left female breast: Secondary | ICD-10-CM | POA: Diagnosis not present

## 2016-01-24 ENCOUNTER — Ambulatory Visit
Admission: RE | Admit: 2016-01-24 | Discharge: 2016-01-24 | Disposition: A | Discharge status: Home / Self Care | Payer: Medicare Other | Source: Ambulatory Visit | Attending: Oncology | Admitting: Oncology

## 2016-01-24 DIAGNOSIS — M8588 Other specified disorders of bone density and structure, other site: Secondary | ICD-10-CM | POA: Insufficient documentation

## 2016-01-24 DIAGNOSIS — C50912 Malignant neoplasm of unspecified site of left female breast: Secondary | ICD-10-CM | POA: Diagnosis present

## 2016-01-24 DIAGNOSIS — M810 Age-related osteoporosis without current pathological fracture: Secondary | ICD-10-CM | POA: Insufficient documentation

## 2016-01-24 HISTORY — DX: Malignant neoplasm of unspecified site of unspecified female breast: C50.919

## 2016-02-15 ENCOUNTER — Emergency Department: Payer: Medicare Other

## 2016-02-15 ENCOUNTER — Emergency Department
Admission: EM | Admit: 2016-02-15 | Discharge: 2016-02-16 | Disposition: A | Discharge status: Home / Self Care | Payer: Medicare Other | Attending: Emergency Medicine | Admitting: Emergency Medicine

## 2016-02-15 ENCOUNTER — Encounter: Payer: Self-pay | Admitting: Emergency Medicine

## 2016-02-15 DIAGNOSIS — E119 Type 2 diabetes mellitus without complications: Secondary | ICD-10-CM | POA: Insufficient documentation

## 2016-02-15 DIAGNOSIS — Z794 Long term (current) use of insulin: Secondary | ICD-10-CM | POA: Insufficient documentation

## 2016-02-15 DIAGNOSIS — J441 Chronic obstructive pulmonary disease with (acute) exacerbation: Secondary | ICD-10-CM | POA: Insufficient documentation

## 2016-02-15 DIAGNOSIS — F329 Major depressive disorder, single episode, unspecified: Secondary | ICD-10-CM | POA: Insufficient documentation

## 2016-02-15 DIAGNOSIS — J9601 Acute respiratory failure with hypoxia: Secondary | ICD-10-CM | POA: Diagnosis not present

## 2016-02-15 DIAGNOSIS — C50412 Malignant neoplasm of upper-outer quadrant of left female breast: Secondary | ICD-10-CM | POA: Insufficient documentation

## 2016-02-15 DIAGNOSIS — F17210 Nicotine dependence, cigarettes, uncomplicated: Secondary | ICD-10-CM | POA: Diagnosis not present

## 2016-02-15 DIAGNOSIS — Z7982 Long term (current) use of aspirin: Secondary | ICD-10-CM | POA: Diagnosis not present

## 2016-02-15 DIAGNOSIS — Z7984 Long term (current) use of oral hypoglycemic drugs: Secondary | ICD-10-CM | POA: Diagnosis not present

## 2016-02-15 DIAGNOSIS — J45909 Unspecified asthma, uncomplicated: Secondary | ICD-10-CM | POA: Insufficient documentation

## 2016-02-15 DIAGNOSIS — I10 Essential (primary) hypertension: Secondary | ICD-10-CM | POA: Diagnosis not present

## 2016-02-15 DIAGNOSIS — E785 Hyperlipidemia, unspecified: Secondary | ICD-10-CM | POA: Insufficient documentation

## 2016-02-15 DIAGNOSIS — Z79899 Other long term (current) drug therapy: Secondary | ICD-10-CM | POA: Diagnosis not present

## 2016-02-15 DIAGNOSIS — E039 Hypothyroidism, unspecified: Secondary | ICD-10-CM | POA: Diagnosis not present

## 2016-02-15 DIAGNOSIS — R0781 Pleurodynia: Secondary | ICD-10-CM | POA: Diagnosis present

## 2016-02-15 DIAGNOSIS — E876 Hypokalemia: Secondary | ICD-10-CM | POA: Diagnosis not present

## 2016-02-15 HISTORY — DX: Unspecified asthma, uncomplicated: J45.909

## 2016-02-15 LAB — CBC WITH DIFFERENTIAL/PLATELET
BASOS ABS: 0 10*3/uL (ref 0–0.1)
Basophils Relative: 0 %
Eosinophils Absolute: 0.1 10*3/uL (ref 0–0.7)
Eosinophils Relative: 1 %
HCT: 43.4 % (ref 35.0–47.0)
HEMOGLOBIN: 14.5 g/dL (ref 12.0–16.0)
LYMPHS ABS: 1.7 10*3/uL (ref 1.0–3.6)
Lymphocytes Relative: 13 %
MCH: 30.3 pg (ref 26.0–34.0)
MCHC: 33.4 g/dL (ref 32.0–36.0)
MCV: 90.7 fL (ref 80.0–100.0)
Monocytes Absolute: 1.1 10*3/uL — ABNORMAL HIGH (ref 0.2–0.9)
Monocytes Relative: 8 %
Neutro Abs: 10.7 10*3/uL — ABNORMAL HIGH (ref 1.4–6.5)
Neutrophils Relative %: 78 %
Platelets: 283 10*3/uL (ref 150–440)
RBC: 4.79 MIL/uL (ref 3.80–5.20)
RDW: 13.2 % (ref 11.5–14.5)
WBC: 13.7 10*3/uL — ABNORMAL HIGH (ref 3.6–11.0)

## 2016-02-15 LAB — COMPREHENSIVE METABOLIC PANEL
ALBUMIN: 4.3 g/dL (ref 3.5–5.0)
ALT: 42 U/L (ref 14–54)
ANION GAP: 10 (ref 5–15)
AST: 36 U/L (ref 15–41)
Alkaline Phosphatase: 36 U/L — ABNORMAL LOW (ref 38–126)
BILIRUBIN TOTAL: 0.5 mg/dL (ref 0.3–1.2)
BUN: 22 mg/dL — AB (ref 6–20)
CALCIUM: 9.4 mg/dL (ref 8.9–10.3)
CO2: 31 mmol/L (ref 22–32)
Chloride: 94 mmol/L — ABNORMAL LOW (ref 101–111)
Creatinine, Ser: 0.88 mg/dL (ref 0.44–1.00)
GFR calc Af Amer: >60 mL/min (ref >60)
GFR calc non Af Amer: >60 mL/min (ref >60)
GLUCOSE: 204 mg/dL — AB (ref 65–99)
Potassium: 2.8 mmol/L — CL (ref 3.5–5.1)
Sodium: 135 mmol/L (ref 135–145)
TOTAL PROTEIN: 7.7 g/dL (ref 6.5–8.1)

## 2016-02-15 LAB — LIPASE, BLOOD: Lipase: 18 U/L (ref 11–51)

## 2016-02-15 LAB — TROPONIN I: Troponin I: 0.03 ng/mL (ref <0.031)

## 2016-02-15 MED ORDER — PREDNISONE 20 MG PO TABS: 40.0000 mg | ORAL_TABLET | Freq: Every day | ORAL | Status: DC

## 2016-02-15 MED ORDER — ALBUTEROL SULFATE (2.5 MG/3ML) 0.083% IN NEBU
5.0000 mg | INHALATION_SOLUTION | Freq: Once | RESPIRATORY_TRACT | Status: AC
  Administered 2016-02-15: 5 mg via RESPIRATORY_TRACT
  Filled 2016-02-15: qty 6

## 2016-02-15 MED ORDER — PREDNISONE 20 MG PO TABS
40.0000 mg | ORAL_TABLET | ORAL | Status: AC
Start: 2016-02-15 — End: 2016-02-15
  Administered 2016-02-15: 40 mg via ORAL
  Filled 2016-02-15: qty 2

## 2016-02-15 MED ORDER — SODIUM CHLORIDE 0.9 % IV BOLUS (SEPSIS)
1000.0000 mL | Freq: Once | INTRAVENOUS | Status: AC
  Administered 2016-02-15: 1000 mL via INTRAVENOUS

## 2016-02-15 MED ORDER — DIATRIZOATE MEGLUMINE & SODIUM 66-10 % PO SOLN
15.0000 mL | Freq: Once | ORAL | Status: AC
  Administered 2016-02-15: 15 mL via ORAL

## 2016-02-15 MED ORDER — DOXYCYCLINE HYCLATE 100 MG PO CAPS: 100.0000 mg | ORAL_CAPSULE | Freq: Two times a day (BID) | ORAL | Status: DC

## 2016-02-15 MED ORDER — DOXYCYCLINE HYCLATE 100 MG PO TABS
100.0000 mg | ORAL_TABLET | Freq: Once | ORAL | Status: AC
  Administered 2016-02-15: 100 mg via ORAL
  Filled 2016-02-15: qty 1

## 2016-02-15 MED ORDER — IPRATROPIUM-ALBUTEROL 0.5-2.5 (3) MG/3ML IN SOLN
3.0000 mL | Freq: Once | RESPIRATORY_TRACT | Status: AC
  Administered 2016-02-15: 3 mL via RESPIRATORY_TRACT
  Filled 2016-02-15: qty 3

## 2016-02-15 MED ORDER — IOPAMIDOL (ISOVUE-370) INJECTION 76%
100.0000 mL | Freq: Once | INTRAVENOUS | Status: AC | PRN
  Administered 2016-02-15: 100 mL via INTRAVENOUS

## 2016-02-15 NOTE — Discharge Instructions (Signed)

## 2016-02-15 NOTE — ED Provider Notes (Signed)
[facility] Emergency Department Provider Note  ____________________________________________  Time seen: 7:15 PM  I have reviewed the triage vital signs and the nursing notes.   HISTORY  Chief Complaint Chest Pain and Shortness of Breath    HPI Ebony [name] is a 70 y.o. female who complains of pleuritic chest pain and shortness of breathsince this morning. Not exertional. No vomiting or diaphoresis. No dizziness or syncope. Her spouse has been sick with viral illness for the past several weeks. Patient also has a nonproductive cough. She does have a history of breast cancer that was recently treated. She's been using her nebulizer at home without relief. She also uses home oxygen. No fevers or chills.  Chest pain is moderate intensity, aching, nonradiating. No alleviating factors.   Past Medical History  Diagnosis Date  . Hyperlipidemia   . Hypertension   . Diabetes mellitus type 2, uncomplicated ([place])   . Hypothyroidism   . Psoriasis   . Depression   . COPD (chronic obstructive pulmonary disease) ([place])   . Osteoporosis, post-menopausal   . Vitamin D deficiency   . Sleep apnea   . Carpal tunnel syndrome   . Breast cancer ([place])   . Asthma      Patient Active Problem List   Diagnosis Date Noted  . Aftercare following surgery   . Acute respiratory failure with hypoxia ([place]) 10/07/2015  . COPD exacerbation ([place]) 10/07/2015  . Community acquired pneumonia 10/07/2015  . Hyperkalemia 10/07/2015  . Breast cancer, stage 1 ([place])   . Cancer of left female breast ([place]) 09/09/2015  . Mild chronic obstructive pulmonary disease ([place]) 06/25/2014  . Inflamed nasal mucosa 06/25/2014  . Apnea, sleep 06/25/2014     Past Surgical History  Procedure Laterality Date  . Tubal ligation    . Right heel spur removed    . Carpal tunnel Right 1995  . Thumb arthroscopy Left 2000    release of a nerve to the thumb.  . Breast lumpectomy with needle  localization Left 10/06/2015    Procedure: BREAST LUMPECTOMY WITH NEEDLE LOCALIZATION;  Surgeon: Hubbard Robinson, MD;  Location: [facility] ORS;  Service: General;  Laterality: Left;  . Sentinel node biopsy Left 10/06/2015    Procedure: SENTINEL NODE BIOPSY;  Surgeon: Hubbard Robinson, MD;  Location: [facility] ORS;  Service: General;  Laterality: Left;  . Breast excisional biopsy Left 10/06/2015    np for surgery lumpectomy     Current Outpatient Rx  Name  Route  Sig  Dispense  Refill  . Adalimumab (HUMIRA PEN-PSORIASIS STARTER) 40 MG/0.8ML PNKT   Subcutaneous   Inject 40 mg into the skin every 14 (fourteen) days.          Marland Kitchen albuterol (PROAIR HFA) 108 (90 BASE) MCG/ACT inhaler      INHALE 2 PUFFS BY MOUTH every 4 hours as needed.         Marland Kitchen albuterol (PROVENTIL) (2.5 MG/3ML) 0.083% nebulizer solution      Inhale 1 vial using nebulizer every six hours as needed         . aspirin EC 81 MG tablet   Oral   Take by mouth every morning.          . budesonide-formoterol (SYMBICORT) 160-4.5 MCG/ACT inhaler      INHALE 2 PUFFS BY MOUTH TWICE DAILY         . buPROPion (WELLBUTRIN XL) 150 MG 24 hr tablet   Oral   Take by mouth every  morning.          . citalopram (CELEXA) 40 MG tablet      once daily in am         . doxycycline (VIBRAMYCIN) 100 MG capsule   Oral   Take 1 capsule (100 mg total) by mouth 2 (two) times daily.   28 capsule   0   . furosemide (LASIX) 20 MG tablet      2 (two) times daily.         Marland Kitchen glipiZIDE (GLUCOTROL) 5 MG tablet   Oral   Take 1 tablet by mouth daily before lunch.          . insulin aspart (NOVOLOG) 100 UNIT/ML injection      38-48 units subcutaneously three a day         . insulin detemir (LEVEMIR) 100 UNIT/ML injection      Inject 85 units subcutaneously twice a day         . letrozole (FEMARA) 2.5 MG tablet   Oral   Take 1 tablet (2.5 mg total) by mouth daily.   90 tablet   3   . levofloxacin (LEVAQUIN) 500 MG  tablet   Oral   Take 1 tablet (500 mg total) by mouth daily.   5 tablet   0   . levothyroxine (SYNTHROID, LEVOTHROID) 200 MCG tablet   Oral   Take by mouth daily before breakfast.          . levothyroxine (SYNTHROID, LEVOTHROID) 50 MCG tablet   Oral   Take by mouth daily before breakfast.          . losartan-hydrochlorothiazide (HYZAAR) 100-25 MG tablet   Oral   Take by mouth. In am.         . metFORMIN (GLUCOPHAGE-XR) 500 MG 24 hr tablet   Oral   Take 500 mg by mouth 2 (two) times daily.          . potassium chloride SA (K-DUR,KLOR-CON) 20 MEQ tablet   Oral   Take 2 tablets (40 mEq total) by mouth 2 (two) times daily.   20 tablet   0   . predniSONE (DELTASONE) 20 MG tablet   Oral   Take 2 tablets (40 mg total) by mouth daily.   8 tablet   0   . simvastatin (ZOCOR) 40 MG tablet   Oral   Take by mouth at bedtime.          Marland Kitchen tiotropium (SPIRIVA HANDIHALER) 18 MCG inhalation capsule      INHALE 1 CAPSULE AS DIRECTED ONCE DAILY in am.         . vitamin E 400 UNIT capsule   Oral   Take by mouth every morning.             Allergies Other   Family History  Problem Relation Age of Onset  . Diabetes Mother   . Thyroid disease Mother   . COPD Father   . Emphysema Father   . Heart disease Brother   . Diabetes Brother   . Heart disease Brother   . Thyroid disease Daughter   . Thyroid disease Son     Social History Social History  Substance Use Topics  . Smoking status: Current Every Day Smoker -- 0.50 packs/day for 30 years    Types: Cigarettes    Last Attempt to Quit: 10/05/2015  . Smokeless tobacco: Never Used  . Alcohol Use: No    Review of Systems  Constitutional:   No fever or chills. No weight changes Eyes:   No vision changes.  ENT:   No sore throat. No rhinorrhea. Cardiovascular: Positive as above chest pain. Respiratory:   Positive shortness of breath and nonproductive cough Gastrointestinal:   Positive right lower quadrant  abdominal pain without vomiting and diarrhea.  No BRBPR or melena. Genitourinary:   Negative for dysuria or difficulty urinating. Musculoskeletal:   Negative for focal pain or swelling Skin:   Negative for rash. Neurological:   Negative for headaches, focal weakness or numbness.  10-point ROS otherwise negative.  ____________________________________________   PHYSICAL EXAM:  VITAL SIGNS: ED Triage Vitals  Enc Vitals Group     BP 02/15/16 1926 100/71 mmHg     Pulse Rate 02/15/16 1855 117     Resp 02/15/16 1855 32     Temp 02/15/16 1855 98.1 F (36.7 C)     Temp Source 02/15/16 1855 Oral     SpO2 02/15/16 1855 93 %     Weight 02/15/16 1855 225 lb (102.059 kg)     Height 02/15/16 1855 5\' 2"  (1.575 m)     Head Cir --      Peak Flow --      Pain Score 02/15/16 1858 7     Pain Loc --      Pain Edu? --      Excl. in [place]? --     Vital signs reviewed, nursing assessments reviewed.   Constitutional:   Alert and oriented. Well appearing and in no distress. Eyes:   No scleral icterus. No conjunctival pallor. PERRL. EOMI ENT   Head:   Normocephalic and atraumatic.   Nose:   No congestion/rhinnorhea. No septal hematoma   Mouth/Throat:   MMM, no pharyngeal erythema. No peritonsillar mass.    Neck:   No stridor. No SubQ emphysema. No meningismus. Hematological/Lymphatic/Immunilogical:   No cervical lymphadenopathy. Cardiovascular:   Tachycardia heart rate 1:15. Symmetric bilateral radial and DP pulses.  No murmurs.  Respiratory:   Tachypnea. Diffuse expiratory wheezing, prolonged expiratory phase. Worse with forceful rapid expiration.. Gastrointestinal:   Soft and nontender. Non distended. There is no CVA tenderness.  No rebound, rigidity, or guarding. Genitourinary:   deferred Musculoskeletal:   Nontender with normal range of motion in all extremities. No joint effusions.  No lower extremity tenderness.  No edema. Neurologic:   Normal speech and language.  CN 2-10  normal. Motor grossly intact. No gross focal neurologic deficits are appreciated.  Skin:    Skin is warm, dry and intact. No rash noted.  No petechiae, purpura, or bullae. Psychiatric:   Mood and affect are normal. ____________________________________________    LABS (pertinent positives/negatives) (all labs ordered are listed, but only abnormal results are displayed) Labs Reviewed  COMPREHENSIVE METABOLIC PANEL - Abnormal; Notable for the following:    Potassium 2.8 (*)    Chloride 94 (*)    Glucose, Bld 204 (*)    BUN 22 (*)    Alkaline Phosphatase 36 (*)    All other components within normal limits  CBC WITH DIFFERENTIAL/PLATELET - Abnormal; Notable for the following:    WBC 13.7 (*)    Neutro Abs 10.7 (*)    Monocytes Absolute 1.1 (*)    All other components within normal limits  BLOOD GAS, VENOUS - Abnormal; Notable for the following:    Bicarbonate 36.8 (*)    Acid-Base Excess 9.8 (*)    All other components within normal limits  LIPASE, BLOOD  TROPONIN I   ____________________________________________   EKG  Interpreted by me Sinus tachycardia rate 116, left axis, normal intervals. Poor R-wave progression in anterior precordial leads. Normal ST segments and T waves.  ____________________________________________    RADIOLOGY  CT angiogram chest negative for PE, small likely inflammatory nodule in the lower lung. No evidence of infectious infiltrate. CT abdomen and pelvis with IV contrast is unremarkable except for diverticulosis.  ____________________________________________   PROCEDURES   ____________________________________________   INITIAL IMPRESSION / ASSESSMENT AND PLAN / ED COURSE  Pertinent labs & imaging results that were available during my care of the patient were reviewed by me and considered in my medical decision making (see chart for details).  Patient with history of breast cancer presents with pleuritic chest pain shortness of breath  and tachycardia. With these findings is imperative to evaluate the patient for pulmonary embolism. CT imaging [facility] is negative. Also included a CT abdomen pelvis due to the right lower quadrant tenderness and her multiple comorbidities and history of diverticulosis. This was negative for any acute findings. The patient is feeling better after symptomatic treatment for COPD which was evident on exam with the wheezing with bronchodilators. Vital signs have improved including the tachycardia and tachypnea. She is feeling much better and wishes to go home. We'll continue her on steroids broken dilators which she has at home, and doxycycline due to her high risk for developing mucoid pneumonia. No evidence of sepsis at this time.Considering the patient's symptoms, medical history, and physical examination today, I have low suspicion for ACS, PE, TAD, pneumothorax, carditis, mediastinitis, pneumonia, CHF, or sepsis.  Patient medically stable for discharge home.  I did offer the patient oral potassium supplementation due to her hypokalemia in the emergency department, but she states that she is on a potassium supplement at home prescribed by her doctor, and therefore declines to take any additional potassium here in the emergency department. She's not having any acute symptoms of hypokalemia so this does not seem unreasonable and she does have medical decision-making capacity.     ____________________________________________   FINAL CLINICAL IMPRESSION(S) / ED DIAGNOSES  Final diagnoses:  COPD with acute exacerbation ([place])  Hypokalemia      Carrie Mew, MD 02/15/16 2326

## 2016-02-15 NOTE — ED Notes (Signed)
Patient transported to CT 

## 2016-02-16 LAB — BLOOD GAS, VENOUS
ACID-BASE EXCESS: 9.8 mmol/L — AB (ref 0.0–3.0)
BICARBONATE: 36.8 meq/L — AB (ref 21.0–28.0)
PATIENT TEMPERATURE: 37
pCO2, Ven: 58 mmHg (ref 44.0–60.0)
pH, Ven: 7.41 (ref 7.320–7.430)

## 2016-02-21 ENCOUNTER — Ambulatory Visit
Admission: RE | Admit: 2016-02-21 | Discharge: 2016-02-21 | Disposition: A | Discharge status: Home / Self Care | Payer: Medicare Other | Source: Ambulatory Visit | Attending: Radiation Oncology | Admitting: Radiation Oncology

## 2016-02-21 ENCOUNTER — Encounter: Payer: Self-pay | Admitting: Radiation Oncology

## 2016-02-21 VITALS — BP 144/70 | HR 111 | Temp 97.6°F | Resp 22 | Wt 222.9 lb

## 2016-02-21 DIAGNOSIS — C50912 Malignant neoplasm of unspecified site of left female breast: Secondary | ICD-10-CM

## 2016-02-21 NOTE — Progress Notes (Signed)
Radiation Oncology Follow up Note  Name: Ebony [name]   Date:   02/21/2016 MRN:  [id number] DOB: 05-23-1946    This 70 y.o. female presents to the clinic today for follow-up for stage I breast cancer now 1 month out external beam radiation.  REFERRING PROVIDER: Renee Rival, NP  HPI: Patient is a 70 year old female now 1 month out of completed radiation therapy for stage I (T1 N0 M0) invasive mammary carcinoma the left breast status post wide local excision and sentinel node biopsy. Tumor was ER/PR positive she has started on. Letrozole August time that well. Had a little exacerbation of her COPD last week was put on some bronchodilators and steroid and that seems to be improving. She specifically denies breast tenderness or bone pain.  COMPLICATIONS OF TREATMENT: none  FOLLOW UP COMPLIANCE: keeps appointments   PHYSICAL EXAM:  BP 144/70 mmHg  Pulse 111  Temp(Src) 97.6 F (36.4 C)  Resp 22  Wt 222 lb 14.2 oz (101.1 kg) Lungs are clear to A&P cardiac examination essentially unremarkable with regular rate and rhythm. No dominant mass or nodularity is noted in either breast in 2 positions examined. Incision is well-healed. No axillary or supraclavicular adenopathy is appreciated. Cosmetic result is excellent. Well-developed well-nourished patient in NAD. HEENT reveals PERLA, EOMI, discs not visualized.  Oral cavity is clear. No oral mucosal lesions are identified. Neck is clear without evidence of cervical or supraclavicular adenopathy. Lungs are clear to A&P. Cardiac examination is essentially unremarkable with regular rate and rhythm without murmur rub or thrill. Abdomen is benign with no organomegaly or masses noted. Motor sensory and DTR levels are equal and symmetric in the upper and lower extremities. Cranial nerves II through XII are grossly intact. Proprioception is intact. No peripheral adenopathy or edema is identified. No motor or sensory levels are noted. Crude visual  fields are within normal range.  RADIOLOGY RESULTS: No current films for review  PLAN: At the present time she is doing well 1 month out from external beam radiation therapy. I am please were overall progress. I have asked to see her back in 4-5 months for follow-up. She continues on letrozole without side effect. Patient knows to call sooner with any concerns.  I would like to take this opportunity for allowing me to participate in the care of your patient.Armstead Peaks., MD

## 2016-02-27 ENCOUNTER — Inpatient Hospital Stay
Admission: EM | Admit: 2016-02-27 | Discharge: 2016-03-22 | DRG: 004 | Disposition: A | Discharge status: Other Acute Inpatient Hospital | Payer: Medicare Other | Attending: Internal Medicine | Admitting: Internal Medicine

## 2016-02-27 ENCOUNTER — Emergency Department: Payer: Medicare Other

## 2016-02-27 ENCOUNTER — Encounter: Payer: Self-pay | Admitting: *Deleted

## 2016-02-27 DIAGNOSIS — J9690 Respiratory failure, unspecified, unspecified whether with hypoxia or hypercapnia: Secondary | ICD-10-CM | POA: Diagnosis present

## 2016-02-27 DIAGNOSIS — E872 Acidosis: Secondary | ICD-10-CM | POA: Diagnosis present

## 2016-02-27 DIAGNOSIS — R0600 Dyspnea, unspecified: Secondary | ICD-10-CM

## 2016-02-27 DIAGNOSIS — M810 Age-related osteoporosis without current pathological fracture: Secondary | ICD-10-CM | POA: Diagnosis present

## 2016-02-27 DIAGNOSIS — J398 Other specified diseases of upper respiratory tract: Secondary | ICD-10-CM | POA: Diagnosis present

## 2016-02-27 DIAGNOSIS — Z8619 Personal history of other infectious and parasitic diseases: Secondary | ICD-10-CM | POA: Diagnosis not present

## 2016-02-27 DIAGNOSIS — Z720 Tobacco use: Secondary | ICD-10-CM | POA: Diagnosis not present

## 2016-02-27 DIAGNOSIS — E876 Hypokalemia: Secondary | ICD-10-CM | POA: Diagnosis present

## 2016-02-27 DIAGNOSIS — E1165 Type 2 diabetes mellitus with hyperglycemia: Secondary | ICD-10-CM | POA: Diagnosis present

## 2016-02-27 DIAGNOSIS — B49 Unspecified mycosis: Secondary | ICD-10-CM | POA: Diagnosis not present

## 2016-02-27 DIAGNOSIS — Z9851 Tubal ligation status: Secondary | ICD-10-CM | POA: Diagnosis not present

## 2016-02-27 DIAGNOSIS — E785 Hyperlipidemia, unspecified: Secondary | ICD-10-CM | POA: Diagnosis present

## 2016-02-27 DIAGNOSIS — I959 Hypotension, unspecified: Secondary | ICD-10-CM | POA: Diagnosis not present

## 2016-02-27 DIAGNOSIS — J168 Pneumonia due to other specified infectious organisms: Secondary | ICD-10-CM | POA: Diagnosis present

## 2016-02-27 DIAGNOSIS — J209 Acute bronchitis, unspecified: Secondary | ICD-10-CM | POA: Diagnosis present

## 2016-02-27 DIAGNOSIS — Z794 Long term (current) use of insulin: Secondary | ICD-10-CM | POA: Diagnosis not present

## 2016-02-27 DIAGNOSIS — E875 Hyperkalemia: Secondary | ICD-10-CM | POA: Diagnosis present

## 2016-02-27 DIAGNOSIS — G9340 Encephalopathy, unspecified: Secondary | ICD-10-CM | POA: Diagnosis present

## 2016-02-27 DIAGNOSIS — B441 Other pulmonary aspergillosis: Secondary | ICD-10-CM | POA: Diagnosis present

## 2016-02-27 DIAGNOSIS — A419 Sepsis, unspecified organism: Secondary | ICD-10-CM | POA: Diagnosis not present

## 2016-02-27 DIAGNOSIS — Z9889 Other specified postprocedural states: Secondary | ICD-10-CM | POA: Diagnosis not present

## 2016-02-27 DIAGNOSIS — J449 Chronic obstructive pulmonary disease, unspecified: Secondary | ICD-10-CM | POA: Diagnosis not present

## 2016-02-27 DIAGNOSIS — R911 Solitary pulmonary nodule: Secondary | ICD-10-CM | POA: Diagnosis present

## 2016-02-27 DIAGNOSIS — J9601 Acute respiratory failure with hypoxia: Secondary | ICD-10-CM

## 2016-02-27 DIAGNOSIS — J440 Chronic obstructive pulmonary disease with acute lower respiratory infection: Principal | ICD-10-CM | POA: Diagnosis present

## 2016-02-27 DIAGNOSIS — E039 Hypothyroidism, unspecified: Secondary | ICD-10-CM | POA: Diagnosis present

## 2016-02-27 DIAGNOSIS — R109 Unspecified abdominal pain: Secondary | ICD-10-CM

## 2016-02-27 DIAGNOSIS — T502X5A Adverse effect of carbonic-anhydrase inhibitors, benzothiadiazides and other diuretics, initial encounter: Secondary | ICD-10-CM | POA: Diagnosis present

## 2016-02-27 DIAGNOSIS — M6289 Other specified disorders of muscle: Secondary | ICD-10-CM | POA: Diagnosis present

## 2016-02-27 DIAGNOSIS — R579 Shock, unspecified: Secondary | ICD-10-CM | POA: Diagnosis present

## 2016-02-27 DIAGNOSIS — Z853 Personal history of malignant neoplasm of breast: Secondary | ICD-10-CM

## 2016-02-27 DIAGNOSIS — K5900 Constipation, unspecified: Secondary | ICD-10-CM

## 2016-02-27 DIAGNOSIS — N179 Acute kidney failure, unspecified: Secondary | ICD-10-CM | POA: Diagnosis present

## 2016-02-27 DIAGNOSIS — Z9911 Dependence on respirator [ventilator] status: Secondary | ICD-10-CM | POA: Diagnosis not present

## 2016-02-27 DIAGNOSIS — F17210 Nicotine dependence, cigarettes, uncomplicated: Secondary | ICD-10-CM | POA: Diagnosis present

## 2016-02-27 DIAGNOSIS — E8770 Fluid overload, unspecified: Secondary | ICD-10-CM | POA: Diagnosis present

## 2016-02-27 DIAGNOSIS — R197 Diarrhea, unspecified: Secondary | ICD-10-CM | POA: Diagnosis present

## 2016-02-27 DIAGNOSIS — I472 Ventricular tachycardia: Secondary | ICD-10-CM | POA: Diagnosis present

## 2016-02-27 DIAGNOSIS — D649 Anemia, unspecified: Secondary | ICD-10-CM | POA: Diagnosis present

## 2016-02-27 DIAGNOSIS — R7989 Other specified abnormal findings of blood chemistry: Secondary | ICD-10-CM | POA: Diagnosis present

## 2016-02-27 DIAGNOSIS — Z7982 Long term (current) use of aspirin: Secondary | ICD-10-CM

## 2016-02-27 DIAGNOSIS — E870 Hyperosmolality and hypernatremia: Secondary | ICD-10-CM | POA: Diagnosis present

## 2016-02-27 DIAGNOSIS — R609 Edema, unspecified: Secondary | ICD-10-CM

## 2016-02-27 DIAGNOSIS — J219 Acute bronchiolitis, unspecified: Secondary | ICD-10-CM | POA: Diagnosis present

## 2016-02-27 DIAGNOSIS — J9622 Acute and chronic respiratory failure with hypercapnia: Secondary | ICD-10-CM | POA: Diagnosis present

## 2016-02-27 DIAGNOSIS — E441 Mild protein-calorie malnutrition: Secondary | ICD-10-CM | POA: Insufficient documentation

## 2016-02-27 DIAGNOSIS — Z8249 Family history of ischemic heart disease and other diseases of the circulatory system: Secondary | ICD-10-CM

## 2016-02-27 DIAGNOSIS — Z825 Family history of asthma and other chronic lower respiratory diseases: Secondary | ICD-10-CM | POA: Diagnosis not present

## 2016-02-27 DIAGNOSIS — B3789 Other sites of candidiasis: Secondary | ICD-10-CM | POA: Diagnosis present

## 2016-02-27 DIAGNOSIS — E662 Morbid (severe) obesity with alveolar hypoventilation: Secondary | ICD-10-CM | POA: Diagnosis present

## 2016-02-27 DIAGNOSIS — J441 Chronic obstructive pulmonary disease with (acute) exacerbation: Secondary | ICD-10-CM | POA: Diagnosis present

## 2016-02-27 DIAGNOSIS — Z833 Family history of diabetes mellitus: Secondary | ICD-10-CM | POA: Diagnosis not present

## 2016-02-27 DIAGNOSIS — F329 Major depressive disorder, single episode, unspecified: Secondary | ICD-10-CM | POA: Diagnosis present

## 2016-02-27 DIAGNOSIS — J9621 Acute and chronic respiratory failure with hypoxia: Secondary | ICD-10-CM | POA: Diagnosis not present

## 2016-02-27 DIAGNOSIS — Z6841 Body Mass Index (BMI) 40.0 and over, adult: Secondary | ICD-10-CM | POA: Diagnosis not present

## 2016-02-27 DIAGNOSIS — J9811 Atelectasis: Secondary | ICD-10-CM | POA: Diagnosis present

## 2016-02-27 DIAGNOSIS — R601 Generalized edema: Secondary | ICD-10-CM

## 2016-02-27 DIAGNOSIS — R509 Fever, unspecified: Secondary | ICD-10-CM | POA: Diagnosis not present

## 2016-02-27 DIAGNOSIS — J189 Pneumonia, unspecified organism: Secondary | ICD-10-CM | POA: Diagnosis not present

## 2016-02-27 DIAGNOSIS — Z95828 Presence of other vascular implants and grafts: Secondary | ICD-10-CM

## 2016-02-27 DIAGNOSIS — G4733 Obstructive sleep apnea (adult) (pediatric): Secondary | ICD-10-CM | POA: Diagnosis present

## 2016-02-27 DIAGNOSIS — J9602 Acute respiratory failure with hypercapnia: Secondary | ICD-10-CM | POA: Diagnosis not present

## 2016-02-27 DIAGNOSIS — R1310 Dysphagia, unspecified: Secondary | ICD-10-CM | POA: Diagnosis present

## 2016-02-27 DIAGNOSIS — R61 Generalized hyperhidrosis: Secondary | ICD-10-CM | POA: Diagnosis present

## 2016-02-27 DIAGNOSIS — I10 Essential (primary) hypertension: Secondary | ICD-10-CM | POA: Diagnosis present

## 2016-02-27 DIAGNOSIS — B3749 Other urogenital candidiasis: Secondary | ICD-10-CM | POA: Diagnosis present

## 2016-02-27 DIAGNOSIS — Z79899 Other long term (current) drug therapy: Secondary | ICD-10-CM | POA: Diagnosis not present

## 2016-02-27 DIAGNOSIS — L8990 Pressure ulcer of unspecified site, unspecified stage: Secondary | ICD-10-CM | POA: Insufficient documentation

## 2016-02-27 DIAGNOSIS — J9600 Acute respiratory failure, unspecified whether with hypoxia or hypercapnia: Secondary | ICD-10-CM

## 2016-02-27 DIAGNOSIS — Z01818 Encounter for other preprocedural examination: Secondary | ICD-10-CM

## 2016-02-27 LAB — BLOOD GAS, ARTERIAL
ALLENS TEST (PASS/FAIL): POSITIVE — AB
Acid-Base Excess: 3.8 mmol/L — ABNORMAL HIGH (ref 0.0–3.0)
Bicarbonate: 29.7 mEq/L — ABNORMAL HIGH (ref 21.0–28.0)
FIO2: 0.36
O2 SAT: 81.7 %
PCO2 ART: 48 mmHg (ref 32.0–48.0)
PH ART: 7.4 (ref 7.350–7.450)
Patient temperature: 37
pO2, Arterial: 46 mmHg — ABNORMAL LOW (ref 83.0–108.0)

## 2016-02-27 LAB — COMPREHENSIVE METABOLIC PANEL
ALK PHOS: 37 U/L — AB (ref 38–126)
ALT: 63 U/L — AB (ref 14–54)
AST: 64 U/L — ABNORMAL HIGH (ref 15–41)
Albumin: 4 g/dL (ref 3.5–5.0)
Anion gap: 12 (ref 5–15)
BUN: 19 mg/dL (ref 6–20)
CALCIUM: 9.4 mg/dL (ref 8.9–10.3)
CHLORIDE: 93 mmol/L — AB (ref 101–111)
CO2: 29 mmol/L (ref 22–32)
CREATININE: 0.81 mg/dL (ref 0.44–1.00)
GFR calc Af Amer: >60 mL/min (ref >60)
GFR calc non Af Amer: >60 mL/min (ref >60)
GLUCOSE: 269 mg/dL — AB (ref 65–99)
Potassium: 3.2 mmol/L — ABNORMAL LOW (ref 3.5–5.1)
SODIUM: 134 mmol/L — AB (ref 135–145)
Total Bilirubin: 0.6 mg/dL (ref 0.3–1.2)
Total Protein: 7.4 g/dL (ref 6.5–8.1)

## 2016-02-27 LAB — CBC
HCT: 41.7 % (ref 35.0–47.0)
HEMOGLOBIN: 14.1 g/dL (ref 12.0–16.0)
MCH: 30.5 pg (ref 26.0–34.0)
MCHC: 33.9 g/dL (ref 32.0–36.0)
MCV: 90 fL (ref 80.0–100.0)
PLATELETS: 244 10*3/uL (ref 150–440)
RBC: 4.63 MIL/uL (ref 3.80–5.20)
RDW: 13.2 % (ref 11.5–14.5)
WBC: 9.4 10*3/uL (ref 3.6–11.0)

## 2016-02-27 LAB — GLUCOSE, CAPILLARY
GLUCOSE-CAPILLARY: 363 mg/dL — AB (ref 65–99)
Glucose-Capillary: 342 mg/dL — ABNORMAL HIGH (ref 65–99)
Glucose-Capillary: 248 mg/dL — ABNORMAL HIGH (ref 65–99)

## 2016-02-27 LAB — TROPONIN I: Troponin I: <0.03 ng/mL (ref <0.031)

## 2016-02-27 LAB — BRAIN NATRIURETIC PEPTIDE: B Natriuretic Peptide: 56 pg/mL (ref 0.0–100.0)

## 2016-02-27 MED ORDER — LEVOTHYROXINE SODIUM 88 MCG PO TABS
200.0000 ug | ORAL_TABLET | Freq: Every day | ORAL | Status: DC
  Administered 2016-02-28 – 2016-03-05 (×7): 200 ug via ORAL
  Filled 2016-02-27 (×8): qty 1

## 2016-02-27 MED ORDER — ONDANSETRON HCL 4 MG PO TABS: 4.0000 mg | ORAL_TABLET | Freq: Four times a day (QID) | ORAL | Status: DC | PRN

## 2016-02-27 MED ORDER — TIOTROPIUM BROMIDE MONOHYDRATE 18 MCG IN CAPS
18.0000 ug | ORAL_CAPSULE | Freq: Every day | RESPIRATORY_TRACT | Status: DC
  Administered 2016-02-27 – 2016-02-28 (×2): 18 ug via RESPIRATORY_TRACT
  Filled 2016-02-27: qty 5

## 2016-02-27 MED ORDER — ONDANSETRON HCL 4 MG/2ML IJ SOLN
4.0000 mg | Freq: Four times a day (QID) | INTRAMUSCULAR | Status: DC | PRN
  Administered 2016-03-19: 4 mg via INTRAVENOUS
  Filled 2016-02-27: qty 2

## 2016-02-27 MED ORDER — POTASSIUM CHLORIDE CRYS ER 20 MEQ PO TBCR
40.0000 meq | EXTENDED_RELEASE_TABLET | Freq: Two times a day (BID) | ORAL | Status: DC
  Administered 2016-02-27 – 2016-03-01 (×5): 40 meq via ORAL
  Filled 2016-02-27 (×7): qty 2

## 2016-02-27 MED ORDER — ACETAMINOPHEN 325 MG PO TABS
650.0000 mg | ORAL_TABLET | Freq: Four times a day (QID) | ORAL | Status: DC | PRN
  Administered 2016-02-28 – 2016-03-07 (×11): 650 mg via ORAL
  Filled 2016-02-27 (×11): qty 2

## 2016-02-27 MED ORDER — LETROZOLE 2.5 MG PO TABS
2.5000 mg | ORAL_TABLET | Freq: Every day | ORAL | Status: DC
  Administered 2016-02-27 – 2016-03-13 (×16): 2.5 mg via ORAL
  Filled 2016-02-27 (×16): qty 1

## 2016-02-27 MED ORDER — ENOXAPARIN SODIUM 40 MG/0.4ML ~~LOC~~ SOLN
40.0000 mg | SUBCUTANEOUS | Status: DC
  Administered 2016-02-27: 40 mg via SUBCUTANEOUS
  Filled 2016-02-27: qty 0.4

## 2016-02-27 MED ORDER — ASPIRIN EC 81 MG PO TBEC
81.0000 mg | DELAYED_RELEASE_TABLET | ORAL | Status: DC
  Administered 2016-02-28 – 2016-03-05 (×7): 81 mg via ORAL
  Filled 2016-02-27 (×7): qty 1

## 2016-02-27 MED ORDER — CITALOPRAM HYDROBROMIDE 20 MG PO TABS
40.0000 mg | ORAL_TABLET | Freq: Every day | ORAL | Status: DC
  Administered 2016-02-27 – 2016-03-05 (×8): 40 mg via ORAL
  Filled 2016-02-27 (×8): qty 2

## 2016-02-27 MED ORDER — FUROSEMIDE 40 MG PO TABS
40.0000 mg | ORAL_TABLET | Freq: Every day | ORAL | Status: DC
  Administered 2016-02-27 – 2016-03-05 (×8): 40 mg via ORAL
  Filled 2016-02-27 (×8): qty 1

## 2016-02-27 MED ORDER — LOSARTAN POTASSIUM-HCTZ 100-25 MG PO TABS: 1.0000 | ORAL_TABLET | Freq: Every day | ORAL | Status: DC

## 2016-02-27 MED ORDER — SIMVASTATIN 40 MG PO TABS
40.0000 mg | ORAL_TABLET | Freq: Every day | ORAL | Status: DC
  Administered 2016-02-27 – 2016-03-06 (×9): 40 mg via ORAL
  Filled 2016-02-27 (×9): qty 1

## 2016-02-27 MED ORDER — CANAGLIFLOZIN 100 MG PO TABS
100.0000 mg | ORAL_TABLET | Freq: Every day | ORAL | Status: DC
  Administered 2016-02-27 – 2016-02-28 (×2): 100 mg via ORAL
  Filled 2016-02-27 (×2): qty 1

## 2016-02-27 MED ORDER — SODIUM CHLORIDE 0.9% FLUSH
3.0000 mL | Freq: Two times a day (BID) | INTRAVENOUS | Status: DC
  Administered 2016-02-27 – 2016-03-03 (×10): 3 mL via INTRAVENOUS

## 2016-02-27 MED ORDER — HYDROCODONE-ACETAMINOPHEN 5-325 MG PO TABS: 1.0000 | ORAL_TABLET | ORAL | Status: DC | PRN

## 2016-02-27 MED ORDER — ACETAMINOPHEN 650 MG RE SUPP
650.0000 mg | Freq: Four times a day (QID) | RECTAL | Status: DC | PRN
Start: 2016-02-27 — End: 2016-03-08
  Administered 2016-03-05 – 2016-03-07 (×2): 650 mg via RECTAL
  Filled 2016-02-27: qty 1

## 2016-02-27 MED ORDER — GLIPIZIDE 5 MG PO TABS
5.0000 mg | ORAL_TABLET | Freq: Every day | ORAL | Status: DC
  Administered 2016-02-27 – 2016-03-01 (×4): 5 mg via ORAL
  Filled 2016-02-27 (×6): qty 1

## 2016-02-27 MED ORDER — IPRATROPIUM-ALBUTEROL 0.5-2.5 (3) MG/3ML IN SOLN
3.0000 mL | Freq: Once | RESPIRATORY_TRACT | Status: AC
  Administered 2016-02-27: 3 mL via RESPIRATORY_TRACT
  Filled 2016-02-27: qty 3

## 2016-02-27 MED ORDER — INSULIN DETEMIR 100 UNIT/ML ~~LOC~~ SOLN
85.0000 [IU] | Freq: Two times a day (BID) | SUBCUTANEOUS | Status: DC
Start: 2016-02-27 — End: 2016-03-02
  Administered 2016-02-27 – 2016-03-01 (×7): 85 [IU] via SUBCUTANEOUS
  Filled 2016-02-27 (×10): qty 0.85

## 2016-02-27 MED ORDER — LEVOTHYROXINE SODIUM 50 MCG PO TABS
50.0000 ug | ORAL_TABLET | Freq: Every day | ORAL | Status: DC
  Administered 2016-02-28 – 2016-03-11 (×13): 50 ug via ORAL
  Filled 2016-02-27 (×2): qty 2
  Filled 2016-02-27 (×2): qty 1
  Filled 2016-02-27: qty 2
  Filled 2016-02-27 (×2): qty 1
  Filled 2016-02-27 (×4): qty 2
  Filled 2016-02-27 (×2): qty 1
  Filled 2016-02-27: qty 2

## 2016-02-27 MED ORDER — MAGNESIUM SULFATE 2 GM/50ML IV SOLN
2.0000 g | Freq: Once | INTRAVENOUS | Status: AC
  Administered 2016-02-27: 2 g via INTRAVENOUS
  Filled 2016-02-27: qty 50

## 2016-02-27 MED ORDER — METHYLPREDNISOLONE SODIUM SUCC 125 MG IJ SOLR
125.0000 mg | Freq: Once | INTRAMUSCULAR | Status: AC
  Administered 2016-02-27: 125 mg via INTRAVENOUS
  Filled 2016-02-27: qty 2

## 2016-02-27 MED ORDER — LOSARTAN POTASSIUM 50 MG PO TABS
100.0000 mg | ORAL_TABLET | Freq: Every day | ORAL | Status: DC
  Administered 2016-02-27 – 2016-02-29 (×3): 100 mg via ORAL
  Filled 2016-02-27 (×2): qty 2
  Filled 2016-02-27: qty 4

## 2016-02-27 MED ORDER — METFORMIN HCL 500 MG PO TABS
1000.0000 mg | ORAL_TABLET | Freq: Two times a day (BID) | ORAL | Status: DC
  Administered 2016-02-27 – 2016-02-29 (×3): 1000 mg via ORAL
  Filled 2016-02-27 (×3): qty 2

## 2016-02-27 MED ORDER — HYDROCHLOROTHIAZIDE 25 MG PO TABS
25.0000 mg | ORAL_TABLET | Freq: Every day | ORAL | Status: DC
  Administered 2016-02-27 – 2016-02-29 (×2): 25 mg via ORAL
  Filled 2016-02-27: qty 1

## 2016-02-27 MED ORDER — SODIUM CHLORIDE 0.9 % IV SOLN
250.0000 mL | INTRAVENOUS | Status: DC | PRN
  Administered 2016-03-15 – 2016-03-20 (×2): 250 mL via INTRAVENOUS

## 2016-02-27 MED ORDER — LEVOFLOXACIN IN D5W 500 MG/100ML IV SOLN
500.0000 mg | INTRAVENOUS | Status: DC
  Administered 2016-02-27 – 2016-02-28 (×2): 500 mg via INTRAVENOUS
  Filled 2016-02-27 (×3): qty 100

## 2016-02-27 MED ORDER — LEVALBUTEROL HCL 0.63 MG/3ML IN NEBU
0.6300 mg | INHALATION_SOLUTION | Freq: Four times a day (QID) | RESPIRATORY_TRACT | Status: AC
  Administered 2016-02-27 – 2016-02-29 (×9): 0.63 mg via RESPIRATORY_TRACT
  Filled 2016-02-27 (×10): qty 3

## 2016-02-27 MED ORDER — BUPROPION HCL ER (XL) 150 MG PO TB24
150.0000 mg | ORAL_TABLET | ORAL | Status: DC
  Administered 2016-02-28 – 2016-03-01 (×3): 150 mg via ORAL
  Filled 2016-02-27 (×4): qty 1

## 2016-02-27 MED ORDER — INSULIN ASPART 100 UNIT/ML ~~LOC~~ SOLN
0.0000 [IU] | Freq: Three times a day (TID) | SUBCUTANEOUS | Status: DC
Start: 2016-02-27 — End: 2016-02-29
  Administered 2016-02-27: 7 [IU] via SUBCUTANEOUS
  Administered 2016-02-27: 9 [IU] via SUBCUTANEOUS
  Administered 2016-02-28 (×2): 1 [IU] via SUBCUTANEOUS
  Administered 2016-02-29: 3 [IU] via SUBCUTANEOUS
  Administered 2016-02-29: 2 [IU] via SUBCUTANEOUS
  Filled 2016-02-27: qty 3
  Filled 2016-02-27: qty 9
  Filled 2016-02-27: qty 1
  Filled 2016-02-27: qty 7
  Filled 2016-02-27 (×2): qty 1
  Filled 2016-02-27: qty 2

## 2016-02-27 MED ORDER — POTASSIUM CHLORIDE CRYS ER 20 MEQ PO TBCR
40.0000 meq | EXTENDED_RELEASE_TABLET | Freq: Once | ORAL | Status: AC
  Administered 2016-02-27: 40 meq via ORAL

## 2016-02-27 MED ORDER — SODIUM CHLORIDE 0.9% FLUSH: 3.0000 mL | INTRAVENOUS | Status: DC | PRN

## 2016-02-27 MED ORDER — HYDROCHLOROTHIAZIDE 25 MG PO TABS
ORAL_TABLET | ORAL | Status: AC
  Filled 2016-02-27: qty 1

## 2016-02-27 MED ORDER — MOMETASONE FURO-FORMOTEROL FUM 200-5 MCG/ACT IN AERO
2.0000 | INHALATION_SPRAY | Freq: Two times a day (BID) | RESPIRATORY_TRACT | Status: DC
  Administered 2016-02-27 – 2016-02-28 (×3): 2 via RESPIRATORY_TRACT
  Filled 2016-02-27: qty 8.8

## 2016-02-27 MED ORDER — DILTIAZEM HCL 25 MG/5ML IV SOLN
10.0000 mg | Freq: Four times a day (QID) | INTRAVENOUS | Status: DC | PRN
  Administered 2016-02-28: 10 mg via INTRAVENOUS
  Filled 2016-02-27 (×2): qty 5

## 2016-02-27 MED ORDER — SODIUM CHLORIDE 0.9% FLUSH
3.0000 mL | Freq: Two times a day (BID) | INTRAVENOUS | Status: DC
  Administered 2016-02-27 – 2016-03-03 (×5): 3 mL via INTRAVENOUS

## 2016-02-27 NOTE — H&P (Signed)
[facility] [place] NAME: Ebony [name]    MR#:  [id number]  DATE OF BIRTH:  11-Dec-1945  DATE OF ADMISSION:  02/27/2016  PRIMARY CARE PHYSICIAN: Renee Rival, NP   REQUESTING/REFERRING PHYSICIAN: Lavonia Drafts M.D.  CHIEF COMPLAINT:   Chief Complaint  Patient presents with  . Respiratory Distress    HISTORY OF PRESENT ILLNESS: Ebony [name]  is a 70 y.o. female with a known history of  chronic respiratory failure due to COPD, sleep apnea, hyperlipidemia,, hypothyroidism, depression, morbid obesity who presents with shortness of breath worsening since yesterday. When she arrived in the ED she had to be placed on BiPAP. Now she is on high flow nasal cannula. Patient does complaint of cough of white sputum no fevers no chills no chest pain. Denies any nausea vomiting or diarrhea denies any urinary frequency urgency or hesitancy.        PAST MEDICAL HISTORY:   Past Medical History  Diagnosis Date  . Hyperlipidemia   . Hypertension   . Diabetes mellitus type 2, uncomplicated ([place])   . Hypothyroidism   . Psoriasis   . Depression   . COPD (chronic obstructive pulmonary disease) ([place])   . Osteoporosis, post-menopausal   . Vitamin D deficiency   . Sleep apnea   . Carpal tunnel syndrome   . Breast cancer ([place])   . Asthma     PAST SURGICAL HISTORY: Past Surgical History  Procedure Laterality Date  . Tubal ligation    . Right heel spur removed    . Carpal tunnel Right 1995  . Thumb arthroscopy Left 2000    release of a nerve to the thumb.  . Breast lumpectomy with needle localization Left 10/06/2015    Procedure: BREAST LUMPECTOMY WITH NEEDLE LOCALIZATION;  Surgeon: Hubbard Robinson, MD;  Location: [facility] ORS;  Service: General;  Laterality: Left;  . Sentinel node biopsy Left 10/06/2015    Procedure: SENTINEL NODE BIOPSY;  Surgeon: Hubbard Robinson, MD;  Location: [facility] ORS;  Service: General;  Laterality: Left;  .  Breast excisional biopsy Left 10/06/2015    np for surgery lumpectomy    SOCIAL HISTORY:  Social History  Substance Use Topics  . Smoking status: Current Every Day Smoker -- 0.50 packs/day for 30 years    Types: Cigarettes    Last Attempt to Quit: 10/05/2015  . Smokeless tobacco: Never Used  . Alcohol Use: No    FAMILY HISTORY:  Family History  Problem Relation Age of Onset  . Diabetes Mother   . Thyroid disease Mother   . COPD Father   . Emphysema Father   . Heart disease Brother   . Diabetes Brother   . Heart disease Brother   . Thyroid disease Daughter   . Thyroid disease Son     DRUG ALLERGIES:  Allergies  Allergen Reactions  . Other Hives and Rash    Shellfish. Shellfish Pt reports no allergic reaction to iodine or betadine.    REVIEW OF SYSTEMS:   CONSTITUTIONAL: No fever, positive fatigue and weakness.  EYES: No blurred or double vision.  EARS, NOSE, AND THROAT: No tinnitus or ear pain.  RESPIRATORY: Positive cough, positive shortness of breath, positive wheezing or no hemoptysis.  CARDIOVASCULAR: No chest pain, orthopnea, edema.  GASTROINTESTINAL: No nausea, vomiting, diarrhea or abdominal pain.  GENITOURINARY: No dysuria, hematuria.  ENDOCRINE: No polyuria, nocturia,  HEMATOLOGY: No anemia, easy bruising or bleeding SKIN: No rash or lesion. MUSCULOSKELETAL:  No joint pain or arthritis.   NEUROLOGIC: No tingling, numbness, weakness.  PSYCHIATRY: No anxiety or depression.   MEDICATIONS AT HOME:  Prior to Admission medications   Medication Sig Start Date End Date Taking? Authorizing Provider  Adalimumab (HUMIRA PEN-PSORIASIS STARTER) 40 MG/0.8ML PNKT Inject 40 mg into the skin every 14 (fourteen) days.    Yes Historical Provider, MD  albuterol (PROAIR HFA) 108 (90 BASE) MCG/ACT inhaler INHALE 2 PUFFS BY MOUTH EVERY 4 HOURS AS NEEDED FOR SHORTNESS OF BREATH AND/OR WHEEZING. 09/28/14  Yes Historical Provider, MD  albuterol (PROVENTIL) (2.5 MG/3ML) 0.083%  nebulizer solution Inhale 1 vial using nebulizer every six hours as needed for shortness of breath and/or wheezing.   Yes Historical Provider, MD  aspirin EC 81 MG tablet Take 81 mg by mouth every morning.    Yes Historical Provider, MD  budesonide-formoterol (SYMBICORT) 160-4.5 MCG/ACT inhaler INHALE 2 PUFFS BY MOUTH TWICE DAILY 09/15/14  Yes Historical Provider, MD  buPROPion (WELLBUTRIN XL) 150 MG 24 hr tablet Take 150 mg by mouth every morning.    Yes Historical Provider, MD  citalopram (CELEXA) 40 MG tablet Take 40 mg by mouth every morning.   Yes Historical Provider, MD  clobetasol (TEMOVATE) 0.05 % external solution Apply 1 application topically daily as needed (for psoriasis.).  02/15/16  Yes Historical Provider, MD  doxycycline (VIBRAMYCIN) 100 MG capsule Take 1 capsule (100 mg total) by mouth 2 (two) times daily. 02/15/16  Yes Carrie Mew, MD  furosemide (LASIX) 20 MG tablet Take 40mg  by mouth once a day.   Yes Historical Provider, MD  glipiZIDE (GLUCOTROL) 5 MG tablet Take 1 tablet by mouth daily before lunch.  08/14/15  Yes Historical Provider, MD  insulin detemir (LEVEMIR) 100 UNIT/ML injection Inject 85 units subcutaneously twice a day   Yes Historical Provider, MD  insulin lispro (HUMALOG KWIKPEN) 100 UNIT/ML KiwkPen Inject 38-48 Units into the skin 3 (three) times daily.   Yes Historical Provider, MD  INVOKANA 100 MG TABS tablet Take 100 mg by mouth daily.  01/31/16  Yes Historical Provider, MD  letrozole (FEMARA) 2.5 MG tablet Take 1 tablet (2.5 mg total) by mouth daily. 01/14/16  Yes Lloyd Huger, MD  levothyroxine (SYNTHROID, LEVOTHROID) 200 MCG tablet Take 200 mcg by mouth daily before breakfast. *Take along with 50 mcg to equal 250 mcg total dose*   Yes Historical Provider, MD  levothyroxine (SYNTHROID, LEVOTHROID) 50 MCG tablet Take 50 mcg by mouth daily before breakfast. *Take along with 200 mcg tablet to equal 250 mcg total dose.*   Yes Historical Provider, MD   losartan-hydrochlorothiazide (HYZAAR) 100-25 MG tablet Take 1 tablet by mouth daily. In am.   Yes Historical Provider, MD  metFORMIN (GLUCOPHAGE) 500 MG tablet Take 1,000 mg by mouth 2 (two) times daily with a meal.   Yes Historical Provider, MD  potassium chloride SA (K-DUR,KLOR-CON) 20 MEQ tablet Take 2 tablets (40 mEq total) by mouth 2 (two) times daily. 10/01/15  Yes Hubbard Robinson, MD  simvastatin (ZOCOR) 40 MG tablet Take 40 mg by mouth at bedtime.    Yes Historical Provider, MD  tiotropium (SPIRIVA) 18 MCG inhalation capsule Place 18 mcg into inhaler and inhale daily.   Yes Historical Provider, MD  levofloxacin (LEVAQUIN) 500 MG tablet Take 1 tablet (500 mg total) by mouth daily. 10/12/15   Bettey Costa, MD  predniSONE (DELTASONE) 20 MG tablet Take 2 tablets (40 mg total) by mouth daily. 02/15/16   Carrie Mew, MD  PHYSICAL EXAMINATION:   VITAL SIGNS: Blood pressure 134/53, pulse 109, temperature 99.1 F (37.3 C), temperature source Oral, resp. rate 20, height 5\' 2"  (1.575 m), weight 102.059 kg (225 lb), SpO2 93 %.  GENERAL:  70 y.o.-year-old patient lying in the bed with mild respiratory distress  EYES: Pupils equal, round, reactive to light and accommodation. No scleral icterus. Extraocular muscles intact.  HEENT: Head atraumatic, normocephalic. Oropharynx and nasopharynx clear.  NECK:  Supple, no jugular venous distention. No thyroid enlargement, no tenderness.  LUNGS: Diminished breath sounds, with bilateral wheezing at the bases no sensory muscle usage CARDIOVASCULAR: S1, S2 normal. No murmurs, rubs, or gallops.  ABDOMEN: Soft, nontender, nondistended. Bowel sounds present. No organomegaly or mass.  EXTREMITIES: No pedal edema, cyanosis, or clubbing.  NEUROLOGIC: Cranial nerves II through XII are intact. Muscle strength 5/5 in all extremities. Sensation intact. Gait not checked.  PSYCHIATRIC: The patient is alert and oriented x 3.  SKIN: No obvious rash, lesion, or  ulcer.   LABORATORY PANEL:   CBC  Recent Labs Lab 02/27/16 0718  WBC 9.4  HGB 14.1  HCT 41.7  PLT 244  MCV 90.0  MCH 30.5  MCHC 33.9  RDW 13.2   ------------------------------------------------------------------------------------------------------------------  Chemistries   Recent Labs Lab 02/27/16 0718  NA 134*  K 3.2*  CL 93*  CO2 29  GLUCOSE 269*  BUN 19  CREATININE 0.81  CALCIUM 9.4  AST 64*  ALT 63*  ALKPHOS 37*  BILITOT 0.6   ------------------------------------------------------------------------------------------------------------------ estimated creatinine clearance is 75.4 mL/min (by C-G formula based on Cr of 0.81). ------------------------------------------------------------------------------------------------------------------ No results for input(s): TSH, T4TOTAL, T3FREE, THYROIDAB in the last 72 hours.  Invalid input(s): FREET3   Coagulation profile No results for input(s): INR, PROTIME in the last 168 hours. ------------------------------------------------------------------------------------------------------------------- No results for input(s): DDIMER in the last 72 hours. -------------------------------------------------------------------------------------------------------------------  Cardiac Enzymes  Recent Labs Lab 02/27/16 0718  TROPONINI <0.03   ------------------------------------------------------------------------------------------------------------------ Invalid input(s): POCBNP  ---------------------------------------------------------------------------------------------------------------  Urinalysis    Component Value Date/Time   COLORURINE Yellow 06/02/2013 1316   APPEARANCEUR Hazy 06/02/2013 1316   LABSPEC 1.025 06/02/2013 1316   PHURINE 6.0 06/02/2013 1316   GLUCOSEU >=500 06/02/2013 1316   HGBUR Negative 06/02/2013 1316   BILIRUBINUR Negative 06/02/2013 1316   KETONESUR Trace 06/02/2013 1316   PROTEINUR  Negative 06/02/2013 1316   NITRITE Negative 06/02/2013 1316   LEUKOCYTESUR Negative 06/02/2013 1316     RADIOLOGY: Dg Chest Portable 1 View  02/27/2016  CLINICAL DATA:  Respiratory distress since yesterday. Audible wheezing. Dry cough. COPD. Smoker. EXAM: PORTABLE CHEST 1 VIEW COMPARISON:  Radiographs and CT dated 02/15/2016. FINDINGS: Stable right upper lobe calcified granuloma. The 6 mm nodule seen in the right lower lobe on the CT is not visible on the current or previous radiographs. The cardiac silhouette is currently borderline enlarged with a mild increase in size. The upper lung zone pulmonary vasculature remains mildly prominent. Stable mild prominence of the interstitial markings. The lungs are hyperexpanded. Diffuse osteopenia. IMPRESSION: 1. Interval borderline cardiomegaly. 2. Stable mild pulmonary vascular congestion and changes of COPD. Electronically Signed   By: Claudie Revering M.D.   On: 02/27/2016 07:47    EKG: Orders placed or performed during the hospital encounter of 02/27/16  . ED EKG  . ED EKG    IMPRESSION AND PLAN: Patient is a 70 year old white female with COPD chronic respiratory failure presents with shortness of breath  1. Acute hypoxic respiratory failure due to acute on chronic COPD exasperation  with associated acute bronchitis Due to patient's heart rate being elevated I'll place on Xopenex Continue high flow oxygen Pulmonary consult IV Solu-Medrol We'll continue her home inhalers  2. Hypokalemia we'll replace her potassium  3. Diabetes type 2 continue high-dose Levemir I will place her on sliding scale patient also on her on high-dose pre-meal insulin which I will hold for now but will need to be resumed Continue her oral regimen  4. Hypothyroidism continue high-dose Synthroid  5. Sinus tachycardia will place her on when necessary Cardizem IV  6. Lipidemia continue simvastatin  All the records are reviewed and case discussed with ED  provider. Management plans discussed with the patient, family and they are in agreement.  CODE STATUS: Full    Code Status Orders        Start     Ordered   02/27/16 1130  Full code   Continuous     02/27/16 1130    Code Status History    Date Active Date Inactive Code Status Order ID Comments User Context   10/07/2015 11:30 AM 10/12/2015  5:35 PM Full Code [id number]  Theodoro Grist, MD Inpatient       TOTAL TIME TAKING CARE OF THIS PATIENT: 55 minutes. Critical care time spent   Dustin Flock M.D on 02/27/2016 at 12:22 PM  Between 7am to 6pm - Pager - [phone]  After 6pm go to [email or web address] - password [email or web address] [facility]  Office  [phone]  CC: Primary care physician; Renee Rival, NP

## 2016-02-27 NOTE — ED Notes (Signed)
Pt presents w/ respiratory distress starting yesterday at 1400. Pt has COPD, wears 2 liters at home. Upon arrival via EMS, PT SaO2 on 2 liters 85. Pt breathing labored, audible wheezing, dry, deep cough.

## 2016-02-27 NOTE — ED Provider Notes (Signed)
[facility] Emergency Department Provider Note  ____________________________________________    I have reviewed the triage vital signs and the nursing notes.   HISTORY  Chief Complaint Respiratory Distress    HPI Ebony [name] is a 70 y.o. female presents with complaints of shortness of breath. Patient reports a history of COPD she is on 2 L of home O2. She reports much worsening shortness of breath this morning she was unable to catch her breath. She reports cough. Denies fevers. No recent travel. No calf pain or swelling. Reports this feels similar to prior COPD exacerbations. She did not take anything prior to arrival     Past Medical History  Diagnosis Date  . Hyperlipidemia   . Hypertension   . Diabetes mellitus type 2, uncomplicated ([place])   . Hypothyroidism   . Psoriasis   . Depression   . COPD (chronic obstructive pulmonary disease) ([place])   . Osteoporosis, post-menopausal   . Vitamin D deficiency   . Sleep apnea   . Carpal tunnel syndrome   . Breast cancer ([place])   . Asthma     Patient Active Problem List   Diagnosis Date Noted  . Aftercare following surgery   . Acute respiratory failure with hypoxia ([place]) 10/07/2015  . COPD exacerbation ([place]) 10/07/2015  . Community acquired pneumonia 10/07/2015  . Hyperkalemia 10/07/2015  . Breast cancer, stage 1 ([place])   . Cancer of left female breast ([place]) 09/09/2015  . Mild chronic obstructive pulmonary disease ([place]) 06/25/2014  . Inflamed nasal mucosa 06/25/2014  . Apnea, sleep 06/25/2014    Past Surgical History  Procedure Laterality Date  . Tubal ligation    . Right heel spur removed    . Carpal tunnel Right 1995  . Thumb arthroscopy Left 2000    release of a nerve to the thumb.  . Breast lumpectomy with needle localization Left 10/06/2015    Procedure: BREAST LUMPECTOMY WITH NEEDLE LOCALIZATION;  Surgeon: Hubbard Robinson, MD;  Location: [facility] ORS;  Service: General;  Laterality:  Left;  . Sentinel node biopsy Left 10/06/2015    Procedure: SENTINEL NODE BIOPSY;  Surgeon: Hubbard Robinson, MD;  Location: [facility] ORS;  Service: General;  Laterality: Left;  . Breast excisional biopsy Left 10/06/2015    np for surgery lumpectomy    Current Outpatient Rx  Name  Route  Sig  Dispense  Refill  . Adalimumab (HUMIRA PEN-PSORIASIS STARTER) 40 MG/0.8ML PNKT   Subcutaneous   Inject 40 mg into the skin every 14 (fourteen) days.          Marland Kitchen albuterol (PROAIR HFA) 108 (90 BASE) MCG/ACT inhaler      INHALE 2 PUFFS BY MOUTH every 4 hours as needed.         Marland Kitchen albuterol (PROVENTIL) (2.5 MG/3ML) 0.083% nebulizer solution      Inhale 1 vial using nebulizer every six hours as needed         . aspirin EC 81 MG tablet   Oral   Take by mouth every morning.          . budesonide-formoterol (SYMBICORT) 160-4.5 MCG/ACT inhaler      INHALE 2 PUFFS BY MOUTH TWICE DAILY         . buPROPion (WELLBUTRIN XL) 150 MG 24 hr tablet   Oral   Take by mouth every morning.          . citalopram (CELEXA) 40 MG tablet      once daily in  am         . clobetasol (TEMOVATE) 0.05 % external solution               . doxycycline (VIBRAMYCIN) 100 MG capsule   Oral   Take 1 capsule (100 mg total) by mouth 2 (two) times daily.   28 capsule   0   . furosemide (LASIX) 20 MG tablet      2 (two) times daily.         Marland Kitchen glipiZIDE (GLUCOTROL) 5 MG tablet   Oral   Take 1 tablet by mouth daily before lunch.          . insulin aspart (NOVOLOG) 100 UNIT/ML injection      38-48 units subcutaneously three a day         . insulin detemir (LEVEMIR) 100 UNIT/ML injection      Inject 85 units subcutaneously twice a day         . INVOKANA 100 MG TABS tablet                 Dispense as written.   Marland Kitchen letrozole (FEMARA) 2.5 MG tablet   Oral   Take 1 tablet (2.5 mg total) by mouth daily.   90 tablet   3   . levofloxacin (LEVAQUIN) 500 MG tablet   Oral   Take 1 tablet  (500 mg total) by mouth daily.   5 tablet   0   . levothyroxine (SYNTHROID, LEVOTHROID) 200 MCG tablet   Oral   Take by mouth daily before breakfast.          . levothyroxine (SYNTHROID, LEVOTHROID) 50 MCG tablet   Oral   Take by mouth daily before breakfast.          . losartan-hydrochlorothiazide (HYZAAR) 100-25 MG tablet   Oral   Take by mouth. In am.         . metFORMIN (GLUCOPHAGE-XR) 500 MG 24 hr tablet   Oral   Take 500 mg by mouth 2 (two) times daily.          . potassium chloride SA (K-DUR,KLOR-CON) 20 MEQ tablet   Oral   Take 2 tablets (40 mEq total) by mouth 2 (two) times daily.   20 tablet   0   . predniSONE (DELTASONE) 20 MG tablet   Oral   Take 2 tablets (40 mg total) by mouth daily.   8 tablet   0   . simvastatin (ZOCOR) 40 MG tablet   Oral   Take by mouth at bedtime.          Marland Kitchen tiotropium (SPIRIVA HANDIHALER) 18 MCG inhalation capsule      INHALE 1 CAPSULE AS DIRECTED ONCE DAILY in am.         . vitamin E 400 UNIT capsule   Oral   Take by mouth every morning.            Allergies Other  Family History  Problem Relation Age of Onset  . Diabetes Mother   . Thyroid disease Mother   . COPD Father   . Emphysema Father   . Heart disease Brother   . Diabetes Brother   . Heart disease Brother   . Thyroid disease Daughter   . Thyroid disease Son     Social History Social History  Substance Use Topics  . Smoking status: Current Every Day Smoker -- 0.50 packs/day for 30 years    Types: Cigarettes  Last Attempt to Quit: 10/05/2015  . Smokeless tobacco: Never Used  . Alcohol Use: No    Review of Systems  Constitutional: Negative for fever. Eyes: Negative for redness ENT: Negative for sore throat Cardiovascular:Positive for tightness Respiratory: As above Gastrointestinal: No nausea Genitourinary: Negative for dysuria. Musculoskeletal: Negative for back pain. Skin: Negative for rash. Neurological: Negative for focal  weakness Psychiatric: no anxiety    ____________________________________________   PHYSICAL EXAM:  VITAL SIGNS: ED Triage Vitals  Enc Vitals Group     BP 02/27/16 0703 177/51 mmHg     Pulse Rate 02/27/16 0703 137     Resp 02/27/16 0703 23     Temp 02/27/16 0703 99.1 F (37.3 C)     Temp Source 02/27/16 0703 Oral     SpO2 02/27/16 0703 87 %     Weight 02/27/16 0703 225 lb (102.059 kg)     Height 02/27/16 0703 5\' 2"  (1.575 m)     Head Cir --      Peak Flow --      Pain Score 02/27/16 0704 0     Pain Loc --      Pain Edu? --      Excl. in [place]? --      Constitutional: Alert and oriented. Well appearing and in no distress.  Eyes: Conjunctivae are normal. No erythema or injection ENT   Head: Normocephalic and atraumatic.   Mouth/Throat: Mucous membranes are moist. Cardiovascular: Tachycardia, regular rhythm. Normal and symmetric distal pulses are present in the upper extremities. No murmurs or rubs  Respiratory: Significantly increased respiratory effort with accessory muscle use, tachypnea. Poor air movement throughout Gastrointestinal: Soft and non-tender in all quadrants. No distention. There is no CVA tenderness. Genitourinary: deferred Musculoskeletal: Nontender with normal range of motion in all extremities. No lower extremity tenderness nor edema. Neurologic:  Normal speech and language. No gross focal neurologic deficits are appreciated. Skin:  Skin is warm, dry and intact. No rash noted. Psychiatric: Patient exhibits appropriate insight and judgment.  ____________________________________________    LABS (pertinent positives/negatives)  Labs Reviewed  CBC  COMPREHENSIVE METABOLIC PANEL  TROPONIN I  BRAIN NATRIURETIC PEPTIDE  BLOOD GAS, ARTERIAL    ____________________________________________   EKG  ED ECG REPORT I, Lavonia Drafts, the attending physician, personally viewed and interpreted this ECG.  Date: 02/27/2016  Rate: 117 Rhythm: Sinus  tachycardia QRS Axis: normal Intervals: normal ST/T Wave abnormalities: normal Conduction Disturbances: none Narrative Interpretation: unremarkable   ____________________________________________    RADIOLOGY  Chest x-ray unremarkable  ____________________________________________   PROCEDURES  Procedure(s) performed: none  Critical Care performed: yes  CRITICAL CARE Performed by: Lavonia Drafts   Total critical care time: 57minutes  Critical care time was exclusive of separately billable procedures and treating other patients.  Critical care was necessary to treat or prevent imminent or life-threatening deterioration.  Critical care was time spent personally by me on the following activities: development of treatment plan with patient and/or surrogate as well as nursing, discussions with consultants, evaluation of patient's response to treatment, examination of patient, obtaining history from patient or surrogate, ordering and performing treatments and interventions, ordering and review of laboratory studies, ordering and review of radiographic studies, pulse oximetry and re-evaluation of patient's condition.   ____________________________________________   INITIAL IMPRESSION / ASSESSMENT AND PLAN / ED COURSE  Pertinent labs & imaging results that were available during my care of the patient were reviewed by me and considered in my medical decision making (see chart for details).  Patient presents in respiratory distress. Heart rate in the 130s oxygen saturation 82-84% on nasal cannula 5 L. BiPAP ordered. Magnesium 2 g IV, Solu-Medrol 125 mg IV, DuoNeb also started.  ----------------------------------------- 9:55 AM on 02/27/2016 -----------------------------------------  Patient's heart rate has improved, pulse ox 97% and she is feeling better. We will recheck ABG to see if we can wean BiPAP  ----------------------------------------- 11:17 AM on  02/27/2016 -----------------------------------------  Patient transition to high flow nasal cannula successfully. ____________________________________________   FINAL CLINICAL IMPRESSION(S) / ED DIAGNOSES  Final diagnoses:  Acute respiratory failure with hypoxia ([place])  COPD with exacerbation ([facility])          Lavonia Drafts, MD 02/27/16 1118

## 2016-02-27 NOTE — Progress Notes (Signed)
Patient O2 sat on Bipap 12/6 50% 100, decreased to 40% FIO2.  Will continue to monitor.

## 2016-02-27 NOTE — ED Notes (Signed)
RT and MD at bedside to try pt on high flow n/c 45LPM 33% FiO2. O2 sat 94%, pt tolerating well.

## 2016-02-28 DIAGNOSIS — J441 Chronic obstructive pulmonary disease with (acute) exacerbation: Secondary | ICD-10-CM

## 2016-02-28 DIAGNOSIS — Z720 Tobacco use: Secondary | ICD-10-CM

## 2016-02-28 DIAGNOSIS — J9602 Acute respiratory failure with hypercapnia: Secondary | ICD-10-CM

## 2016-02-28 LAB — BASIC METABOLIC PANEL
Anion gap: 5 (ref 5–15)
BUN: 23 mg/dL — AB (ref 6–20)
CO2: 33 mmol/L — ABNORMAL HIGH (ref 22–32)
CREATININE: 0.6 mg/dL (ref 0.44–1.00)
Calcium: 9.7 mg/dL (ref 8.9–10.3)
Chloride: 99 mmol/L — ABNORMAL LOW (ref 101–111)
GFR calc Af Amer: >60 mL/min (ref >60)
GLUCOSE: 182 mg/dL — AB (ref 65–99)
Potassium: 3.8 mmol/L (ref 3.5–5.1)
SODIUM: 137 mmol/L (ref 135–145)

## 2016-02-28 LAB — CBC
HEMATOCRIT: 42.3 % (ref 35.0–47.0)
HEMOGLOBIN: 14 g/dL (ref 12.0–16.0)
MCH: 29.9 pg (ref 26.0–34.0)
MCHC: 33 g/dL (ref 32.0–36.0)
MCV: 90.6 fL (ref 80.0–100.0)
PLATELETS: 242 10*3/uL (ref 150–440)
RBC: 4.67 MIL/uL (ref 3.80–5.20)
RDW: 13 % (ref 11.5–14.5)
WBC: 8.3 10*3/uL (ref 3.6–11.0)

## 2016-02-28 LAB — GLUCOSE, CAPILLARY
GLUCOSE-CAPILLARY: 135 mg/dL — AB (ref 65–99)
Glucose-Capillary: 148 mg/dL — ABNORMAL HIGH (ref 65–99)
Glucose-Capillary: 57 mg/dL — ABNORMAL LOW (ref 65–99)
Glucose-Capillary: 103 mg/dL — ABNORMAL HIGH (ref 65–99)

## 2016-02-28 LAB — MRSA PCR SCREENING: MRSA by PCR: NEGATIVE

## 2016-02-28 MED ORDER — ENOXAPARIN SODIUM 40 MG/0.4ML ~~LOC~~ SOLN
40.0000 mg | Freq: Two times a day (BID) | SUBCUTANEOUS | Status: DC
  Administered 2016-02-28 – 2016-03-02 (×7): 40 mg via SUBCUTANEOUS
  Filled 2016-02-28 (×7): qty 0.4

## 2016-02-28 MED ORDER — LEVALBUTEROL HCL 0.63 MG/3ML IN NEBU
0.6300 mg | INHALATION_SOLUTION | RESPIRATORY_TRACT | Status: DC | PRN
  Administered 2016-03-11 – 2016-03-16 (×7): 0.63 mg via RESPIRATORY_TRACT
  Filled 2016-02-28 (×7): qty 3

## 2016-02-28 MED ORDER — IPRATROPIUM-ALBUTEROL 0.5-2.5 (3) MG/3ML IN SOLN: 3.0000 mL | Freq: Four times a day (QID) | RESPIRATORY_TRACT | Status: DC

## 2016-02-28 MED ORDER — BUDESONIDE 0.25 MG/2ML IN SUSP
0.2500 mg | Freq: Four times a day (QID) | RESPIRATORY_TRACT | Status: DC
  Administered 2016-02-28 – 2016-03-16 (×66): 0.25 mg via RESPIRATORY_TRACT
  Filled 2016-02-28 (×67): qty 2

## 2016-02-28 MED ORDER — METHYLPREDNISOLONE SODIUM SUCC 125 MG IJ SOLR
60.0000 mg | Freq: Four times a day (QID) | INTRAMUSCULAR | Status: DC
  Administered 2016-02-28: 60 mg via INTRAVENOUS
  Filled 2016-02-28: qty 2

## 2016-02-28 MED ORDER — LEVALBUTEROL HCL 0.63 MG/3ML IN NEBU: 0.6300 mg | INHALATION_SOLUTION | RESPIRATORY_TRACT | Status: DC | PRN

## 2016-02-28 MED ORDER — IPRATROPIUM BROMIDE 0.02 % IN SOLN
0.5000 mg | Freq: Four times a day (QID) | RESPIRATORY_TRACT | Status: AC
  Administered 2016-02-28 – 2016-02-29 (×5): 0.5 mg via RESPIRATORY_TRACT
  Filled 2016-02-28 (×5): qty 2.5

## 2016-02-28 MED ORDER — METHYLPREDNISOLONE SODIUM SUCC 40 MG IJ SOLR
40.0000 mg | Freq: Two times a day (BID) | INTRAMUSCULAR | Status: DC
  Administered 2016-02-29: 40 mg via INTRAVENOUS
  Filled 2016-02-28: qty 1

## 2016-02-28 MED ORDER — DEXTROSE 50 % IV SOLN
INTRAVENOUS | Status: AC
  Administered 2016-02-28: 25 mL
  Filled 2016-02-28: qty 50

## 2016-02-28 MED ORDER — MORPHINE SULFATE (PF) 2 MG/ML IV SOLN
2.0000 mg | INTRAVENOUS | Status: DC | PRN
  Administered 2016-02-28 – 2016-02-29 (×4): 2 mg via INTRAVENOUS
  Filled 2016-02-28 (×4): qty 1

## 2016-02-28 MED ORDER — IPRATROPIUM BROMIDE 0.02 % IN SOLN
0.5000 mg | Freq: Four times a day (QID) | RESPIRATORY_TRACT | Status: DC
  Administered 2016-02-28: 0.5 mg via RESPIRATORY_TRACT
  Filled 2016-02-28: qty 2.5

## 2016-02-28 MED ORDER — BUDESONIDE 0.25 MG/2ML IN SUSP
0.2500 mg | Freq: Four times a day (QID) | RESPIRATORY_TRACT | Status: DC
  Administered 2016-02-28: 0.25 mg via RESPIRATORY_TRACT
  Filled 2016-02-28: qty 2

## 2016-02-28 NOTE — Progress Notes (Signed)
Chaplain rounded the unit and provided a compassionate presence with support through silent prayer to the patient. Chaplain Arisbel Maione [phone] 

## 2016-02-28 NOTE — Progress Notes (Signed)
Pt BMI >40. Pt is ordered lovenox 40mg  q 24 hours. Per protocol, order has been transitioned to 40mg  q 12 hours due to BMI.  Ramond Dial, Pharm.D Clinical Pharmacist

## 2016-02-28 NOTE — Progress Notes (Signed)
Pt CBG checked and resulted at 57. 1 AMP D50 given per protocol will continue to assess. Recheck sugar in 15 min

## 2016-02-28 NOTE — Progress Notes (Signed)
Patient has labored breathing with HFNC.  Dr Margaretmary Eddy ordered bipap, placed on by RT and ordered to be transferred to ICU.  Patient and husband aware of transfer.

## 2016-02-28 NOTE — Progress Notes (Signed)
Pt in stable condition at this time. No complaints of pain on my shift. Pt remains on Bipap at 40% with a RR in the 30's. Report given to oncoming RN.

## 2016-02-28 NOTE — Consult Note (Signed)
PULMONARY / CRITICAL CARE MEDICINE   Name: Ebony [name] MRN: [id number] DOB: [birth year]    ADMISSION DATE:  02/27/2016 CONSULTATION DATE:  02/28/16 REFERRING MD: Margaretmary Eddy, A  CHIEF COMPLAINT:  Increased shortness of breath  HISTORY OF PRESENT ILLNESS:   Falls Ebony [name] is a 70 year old female with medical history of HTN, DM,chronic respiratory failure due to COPD,sleep apnea, Hyperlipidemia, hypothyroidism, depression, morbid obesity who presented to [facility] on 4/16 with increased shortness of breath . She was placed on BiPAP and transitioned to HFNC, but the patient continues to complaint of increased shortness of breath, tightness, patient had a hard time to speak in full sentences. Patient was transferred to ICU for further and PCCM team was consulted for further management.  Patient denies any chest pain, nausea, vomiting, dizziness , diaphoresis.  PAST MEDICAL HISTORY :  She  has a past medical history of Hyperlipidemia; Hypertension; Diabetes mellitus type 2, uncomplicated ([place]); Hypothyroidism; Psoriasis; Depression; COPD (chronic obstructive pulmonary disease) ([place]); Osteoporosis, post-menopausal; Vitamin D deficiency; Sleep apnea; Carpal tunnel syndrome; Breast cancer ([place]); and Asthma.  PAST SURGICAL HISTORY: She  has past surgical history that includes Tubal ligation; Right heel spur removed; carpal tunnel (Right, 1995); Thumb arthroscopy (Left, 2000); Breast lumpectomy with needle localization (Left, 10/06/2015); Sentinel node biopsy (Left, 10/06/2015); and Breast excisional biopsy (Left, 10/06/2015).  Allergies  Allergen Reactions  . Other Hives and Rash    Shellfish. Shellfish Pt reports no allergic reaction to iodine or betadine.    No current facility-administered medications on file prior to encounter.   Current Outpatient Prescriptions on File Prior to Encounter  Medication Sig  . Adalimumab (HUMIRA PEN-PSORIASIS STARTER) 40 MG/0.8ML PNKT Inject 40 mg into the skin every  14 (fourteen) days.   Marland Kitchen albuterol (PROAIR HFA) 108 (90 BASE) MCG/ACT inhaler INHALE 2 PUFFS BY MOUTH EVERY 4 HOURS AS NEEDED FOR SHORTNESS OF BREATH AND/OR WHEEZING.  Marland Kitchen albuterol (PROVENTIL) (2.5 MG/3ML) 0.083% nebulizer solution Inhale 1 vial using nebulizer every six hours as needed for shortness of breath and/or wheezing.  Marland Kitchen aspirin EC 81 MG tablet Take 81 mg by mouth every morning.   . budesonide-formoterol (SYMBICORT) 160-4.5 MCG/ACT inhaler INHALE 2 PUFFS BY MOUTH TWICE DAILY  . buPROPion (WELLBUTRIN XL) 150 MG 24 hr tablet Take 150 mg by mouth every morning.   . citalopram (CELEXA) 40 MG tablet Take 40 mg by mouth every morning.  . clobetasol (TEMOVATE) 0.05 % external solution Apply 1 application topically daily as needed (for psoriasis.).   Marland Kitchen doxycycline (VIBRAMYCIN) 100 MG capsule Take 1 capsule (100 mg total) by mouth 2 (two) times daily.  . furosemide (LASIX) 20 MG tablet Take 40mg  by mouth once a day.  Marland Kitchen glipiZIDE (GLUCOTROL) 5 MG tablet Take 1 tablet by mouth daily before lunch.   . insulin detemir (LEVEMIR) 100 UNIT/ML injection Inject 85 units subcutaneously twice a day  . INVOKANA 100 MG TABS tablet Take 100 mg by mouth daily.   Marland Kitchen letrozole (FEMARA) 2.5 MG tablet Take 1 tablet (2.5 mg total) by mouth daily.  Marland Kitchen levothyroxine (SYNTHROID, LEVOTHROID) 200 MCG tablet Take 200 mcg by mouth daily before breakfast. *Take along with 50 mcg to equal 250 mcg total dose*  . levothyroxine (SYNTHROID, LEVOTHROID) 50 MCG tablet Take 50 mcg by mouth daily before breakfast. *Take along with 200 mcg tablet to equal 250 mcg total dose.*  . losartan-hydrochlorothiazide (HYZAAR) 100-25 MG tablet Take 1 tablet by mouth daily. In am.  . potassium chloride SA (K-DUR,KLOR-CON)  20 MEQ tablet Take 2 tablets (40 mEq total) by mouth 2 (two) times daily.  . simvastatin (ZOCOR) 40 MG tablet Take 40 mg by mouth at bedtime.   Marland Kitchen levofloxacin (LEVAQUIN) 500 MG tablet Take 1 tablet (500 mg total) by mouth daily.  .  predniSONE (DELTASONE) 20 MG tablet Take 2 tablets (40 mg total) by mouth daily.    FAMILY HISTORY:  Her indicated that her mother is deceased. She indicated that her father is deceased. She indicated that two of her three brothers are alive. She indicated that her daughter is alive. She indicated that her son is alive. She indicated that her maternal uncle is alive.   SOCIAL HISTORY: She  reports that she has been smoking Cigarettes.  She has a 15 pack-year smoking history. She has never used smokeless tobacco. She reports that she does not drink alcohol or use illicit drugs.  REVIEW OF SYSTEMS:   Unable to obtain as the patient is on BiPAP at this time  SUBJECTIVE:  Patient  States that she was transferred due to increased shortness of breath, but feels better on BiPAP  VITAL SIGNS: BP 146/54 mmHg  Pulse 96  Temp(Src) 97.9 F (36.6 C) (Oral)  Resp 24  Ht 5\' 2"  (1.575 m)  Wt 227 lb 11.2 oz (103.284 kg)  BMI 41.64 kg/m2  SpO2 100%  HEMODYNAMICS:    VENTILATOR SETTINGS: Vent Mode:  [-]  FiO2 (%):  [32 %-40 %] 40 %  INTAKE / OUTPUT: I/O last 3 completed shifts: In: 240 [P.O.:240] Out: 2000 [Urine:2000]  PHYSICAL EXAMINATION: General:  Sickly appearing white female Neuro: Awake, alert , oriented, follows command HEENT: Atraumatic, normocephalic,no discharge Cardiovascular: S1S2, RRR. [place] Lungs:  Diminished bilaterally, symettrical chest expansion Abdomen: obese, soft, non tender, good bowel sounds Musculoskeletal: good muscle tone, no inflamation, deformity noted. Skin:  Grossly intact  LABS:  BMET  Recent Labs Lab 02/27/16 0718 02/28/16 0548  NA 134* 137  K 3.2* 3.8  CL 93* 99*  CO2 29 33*  BUN 19 23*  CREATININE 0.81 0.60  GLUCOSE 269* 182*    Electrolytes  Recent Labs Lab 02/27/16 0718 02/28/16 0548  CALCIUM 9.4 9.7    CBC  Recent Labs Lab 02/27/16 0718 02/28/16 0548  WBC 9.4 8.3  HGB 14.1 14.0  HCT 41.7 42.3  PLT 244 242     Coag's No results for input(s): APTT, INR in the last 168 hours.  Sepsis Markers No results for input(s): LATICACIDVEN, PROCALCITON, O2SATVEN in the last 168 hours.  ABG  Recent Labs Lab 02/27/16 0716 02/27/16 1028  PHART 7.40 7.34*  PCO2ART 48 57*  PO2ART 46* 115*    Liver Enzymes  Recent Labs Lab 02/27/16 0718  AST 64*  ALT 63*  ALKPHOS 37*  BILITOT 0.6  ALBUMIN 4.0    Cardiac Enzymes  Recent Labs Lab 02/27/16 0718  TROPONINI <0.03    Glucose  Recent Labs Lab 02/27/16 1258 02/27/16 1825 02/27/16 2115 02/28/16 0756 02/28/16 1126  GLUCAP 342* 363* 248* 135* 148*    Imaging No results found.   STUDIES:   4/4/ 17 >> CT chest was Negative for acute pulmonary embolism.Mild nodularity in the lung bases, with resolution of nodularopacities observed in November and development of a new 6 mm rightlower lobe nodule. This is most likely inflammatory given theinterval changes.  CULTURES: none  ANTIBIOTICS:  4/16 levaquin>>  SIGNIFICANT EVENTS: 4/17>  Transferred to ICU  LINES/TUBES: NONE  DISCUSSION:  70 year old female  with known history of chronic respiratory failure due to COPD, Sleep apnea, hyperlipidemia,  ASSESSMENT / PLAN:  PULMONARY A:  Acute on chronic respiratory failure COPD exacerbation with acute bronchiolitis Right lower lobe lung nodule (benign and old) P:   Continue levaquin Continue solumedrol  Use BiPAP prn to keep O2 sats> 88% Continue pulmicort/xopnex/ atrovent cxr in am CARDIOVASCULAR A:  Hypertension, Sinus Tachycardia Hyperlipidemia P:  Diltiazem PRN Continue Simvastatin Continue home dose lasix Continue hyzaar   RENAL A:   No active issues  P:   Monitor electrolytes Replace lytes per protocol  GASTROINTESTINAL A:   No active issues P:   On HH diet HEMATOLOGIC A:   No active issues P:  lovenox for DVT prophylaxis SCD'S  INFECTIOUS A:   No signs of active infection P:   Started on  levauin  ENDOCRINE A:   Diabetese  Melitus Hypothyroidism P:   B Continue oral hypoglycemics Continue synthroid  NEUROLOGIC A:   No active issues Depression P:   RASS goal:0 Management per primary team   FAMILY  - Updates: No family member present at the bedside  - Inter-disciplinary family meet or Palliative Care meeting due by: 03/06/16    Bincy Varughese,AG-ACNP Pulmonary & Critical Care    PCCM ATTENDING ATTESTATION:  I have evaluated patient with ANP Patria Mane, reviewed database in its entirety and discussed care plan in detail. In addition, this patient was discussed on multidisciplinary rounds.   Important exam findings: NAD on BiPAP Cognition intact HEENT WNL No JVD noted Diffuse scattered wheezes Reg, no M Obese, soft, NT, +BS No LE edema  Major problems addressed by PCCM team: Acute on chronic hypercarbic respiratory failure AECOPD with bronchospasm DM 2, poorly controlled Smoker  PLAN/REC: Continue BiPAP - changed to PRN Continue supplemental O2 to maintain SpO2 90-95% Continue nebulized bronchodilators Add nebulized steroids Continue empiric abx for purulent bronchitis Reduce dose of methylprednisolone due to hyperglycemia Counseled re: need for smoking cessation  Merton Border, MD PCCM service Mobile [phone] Pager [phone]

## 2016-02-28 NOTE — Progress Notes (Signed)
[facility] NAME: Ebony [name]    MR#:  [id number]  DATE OF BIRTH:  03/19/1946  SUBJECTIVE:  CHIEF COMPLAINT:  Patient is working hard to breathe. Feeling tight in her chest. Not feeling well. Husband is at bedside. Having hard time to talk in full sentences  REVIEW OF SYSTEMS:  CONSTITUTIONAL: No fever, fatigue or weakness.  EYES: No blurred or double vision.  EARS, NOSE, AND THROAT: No tinnitus or ear pain.  RESPIRATORY: Reports cough, shortness of breath while resting, wheezing denies hemoptysis.  CARDIOVASCULAR: No chest pain, orthopnea, edema.  GASTROINTESTINAL: No nausea, vomiting, diarrhea or abdominal pain.  GENITOURINARY: No dysuria, hematuria.  ENDOCRINE: No polyuria, nocturia,  HEMATOLOGY: No anemia, easy bruising or bleeding SKIN: No rash or lesion. MUSCULOSKELETAL: No joint pain or arthritis.   NEUROLOGIC: No tingling, numbness, weakness.  PSYCHIATRY: No anxiety or depression.   DRUG ALLERGIES:   Allergies  Allergen Reactions  . Other Hives and Rash    Shellfish. Shellfish Pt reports no allergic reaction to iodine or betadine.    VITALS:  Blood pressure 146/54, pulse 96, temperature 97.9 F (36.6 C), temperature source Oral, resp. rate 24, height 5\' 2"  (1.575 m), weight 103.284 kg (227 lb 11.2 oz), SpO2 96 %.  PHYSICAL EXAMINATION:  GENERAL:  70 y.o.-year-old patient lying in the bed with no acute distress. Obese EYES: Pupils equal, round, reactive to light and accommodation. No scleral icterus. Extraocular muscles intact.  HEENT: Head atraumatic, normocephalic. Oropharynx and nasopharynx clear.  NECK:  Supple, no jugular venous distention. No thyroid enlargement, no tenderness.  LUNGS: Minimal breath sounds bilaterally,  minimal wheezing, no rales,rhonchi or crepitation. Positive use of accessory muscles of respiration.  CARDIOVASCULAR: S1, S2 normal. No murmurs, rubs, or gallops.  ABDOMEN: Soft,  nontender, nondistended. Bowel sounds present. No organomegaly or mass.  EXTREMITIES: No pedal edema, cyanosis, or clubbing.  NEUROLOGIC: Cranial nerves II through XII are intact. Muscle strength 5/5 in all extremities. Sensation intact. Gait not checked.  PSYCHIATRIC: The patient is alert and oriented x 3.  SKIN: No obvious rash, lesion, or ulcer.    LABORATORY PANEL:   CBC  Recent Labs Lab 02/28/16 0548  WBC 8.3  HGB 14.0  HCT 42.3  PLT 242   ------------------------------------------------------------------------------------------------------------------  Chemistries   Recent Labs Lab 02/27/16 0718 02/28/16 0548  NA 134* 137  K 3.2* 3.8  CL 93* 99*  CO2 29 33*  GLUCOSE 269* 182*  BUN 19 23*  CREATININE 0.81 0.60  CALCIUM 9.4 9.7  AST 64*  --   ALT 63*  --   ALKPHOS 37*  --   BILITOT 0.6  --    ------------------------------------------------------------------------------------------------------------------  Cardiac Enzymes  Recent Labs Lab 02/27/16 0718  TROPONINI <0.03   ------------------------------------------------------------------------------------------------------------------  RADIOLOGY:  Dg Chest Portable 1 View  02/27/2016  CLINICAL DATA:  Respiratory distress since yesterday. Audible wheezing. Dry cough. COPD. Smoker. EXAM: PORTABLE CHEST 1 VIEW COMPARISON:  Radiographs and CT dated 02/15/2016. FINDINGS: Stable right upper lobe calcified granuloma. The 6 mm nodule seen in the right lower lobe on the CT is not visible on the current or previous radiographs. The cardiac silhouette is currently borderline enlarged with a mild increase in size. The upper lung zone pulmonary vasculature remains mildly prominent. Stable mild prominence of the interstitial markings. The lungs are hyperexpanded. Diffuse osteopenia. IMPRESSION: 1. Interval borderline cardiomegaly. 2. Stable mild pulmonary vascular congestion and changes of COPD. Electronically Signed  By:  Claudie Revering M.D.   On: 02/27/2016 07:47    EKG:   Orders placed or performed during the hospital encounter of 02/27/16  . ED EKG  . ED EKG    ASSESSMENT AND PLAN:   1. Acute hypoxic respiratory failure due to acute on chronic COPD exacerbation with associated acute bronchitis Transfer patient to stepdown unit and stat BiPAP First dose of Solu-Medrol 125 mg IV was given in the ED yesterday. We'll start her on centimeters 60 mg IV every 6 hours Due to patient's heart rate being elevated I'll place on Xopenex Continue high flow oxygen will be changed to BiPAP Pulmonary consult We'll start her on nebulizer treatments  2. Hypokalemia repleted, potassium at 3.8 today  3. Diabetes type 2 continue high-dose Levemir I will place her on sliding scale patient also on her on high-dose pre-meal insulin which I will hold for now but will need to be resumed Continue her oral regimen  4. Hypothyroidism continue high-dose Synthroid  5. Sinus tachycardia will place her on when necessary Cardizem IV  6. Lipidemia continue simvastatin  All the records are reviewed and case discussed with ED provider. Management plans discussed with the patient, husband and they are in agreement.  Greater than 50% time was spent on face-to-face education, counseling and coordination of care   All the records are reviewed and case discussed with Care Management/Social Workerr. Management plans discussed with the patient, family and they are in agreement.  CODE STATUS: Full code, husband is the healthcare power of attorney  TOTAL CRITICAL CARE TIME TAKING CARE OF THIS PATIENT: 41 minutes.   POSSIBLE D/C IN 2-3 DAYS, DEPENDING ON CLINICAL CONDITION.   Nicholes Mango M.D on 02/28/2016 at 12:03 PM  Between 7am to 6pm - Pager - [phone] After 6pm go to [email or web address] - password [email or web address] [facility]  Office  [phone]  CC: Primary care physician; Renee Rival, NP

## 2016-02-29 ENCOUNTER — Inpatient Hospital Stay: Payer: Medicare Other

## 2016-02-29 DIAGNOSIS — J9601 Acute respiratory failure with hypoxia: Secondary | ICD-10-CM

## 2016-02-29 DIAGNOSIS — J209 Acute bronchitis, unspecified: Secondary | ICD-10-CM

## 2016-02-29 DIAGNOSIS — J9622 Acute and chronic respiratory failure with hypercapnia: Secondary | ICD-10-CM

## 2016-02-29 DIAGNOSIS — J9621 Acute and chronic respiratory failure with hypoxia: Secondary | ICD-10-CM

## 2016-02-29 LAB — BLOOD GAS, ARTERIAL
ACID-BASE EXCESS: 3.3 mmol/L — AB (ref 0.0–3.0)
ACID-BASE EXCESS: 10 mmol/L — AB (ref 0.0–3.0)
ALLENS TEST (PASS/FAIL): POSITIVE — AB
ALLENS TEST (PASS/FAIL): POSITIVE — AB
Acid-Base Excess: 7 mmol/L — ABNORMAL HIGH (ref 0.0–3.0)
Allens test (pass/fail): POSITIVE — AB
BICARBONATE: 30.8 meq/L — AB (ref 21.0–28.0)
BICARBONATE: 41.7 meq/L — AB (ref 21.0–28.0)
Bicarbonate: 38.2 mEq/L — ABNORMAL HIGH (ref 21.0–28.0)
Delivery systems: POSITIVE
Expiratory PAP: 6
Expiratory PAP: 6
FIO2: 0.4
FIO2: 0.4
FIO2: 0.6
INSPIRATORY PAP: 12
Inspiratory PAP: 18
LHR: 20 {breaths}/min
MECHANICAL RATE: 10
MECHVT: 450 mL
Mode: POSITIVE
O2 SAT: 93.6 %
O2 SAT: 99.4 %
O2 Saturation: 98.3 %
PATIENT TEMPERATURE: 37
PATIENT TEMPERATURE: 37
PATIENT TEMPERATURE: 37
PCO2 ART: 57 mmHg — AB (ref 32.0–48.0)
PCO2 ART: 87 mmHg — AB (ref 32.0–48.0)
PEEP: 5 cmH2O
PO2 ART: 79 mmHg — AB (ref 83.0–108.0)
PO2 ART: 179 mmHg — AB (ref 83.0–108.0)
RATE: 12 resp/min
pCO2 arterial: 93 mmHg (ref 32.0–48.0)
pH, Arterial: 7.34 — ABNORMAL LOW (ref 7.350–7.450)
pH, Arterial: 7.26 — ABNORMAL LOW (ref 7.350–7.450)
pH, Arterial: 7.25 — ABNORMAL LOW (ref 7.350–7.450)
pO2, Arterial: 115 mmHg — ABNORMAL HIGH (ref 83.0–108.0)

## 2016-02-29 LAB — BASIC METABOLIC PANEL
ANION GAP: 8 (ref 5–15)
BUN: 37 mg/dL — ABNORMAL HIGH (ref 6–20)
CALCIUM: 9.6 mg/dL (ref 8.9–10.3)
CO2: 35 mmol/L — ABNORMAL HIGH (ref 22–32)
CREATININE: 1.3 mg/dL — AB (ref 0.44–1.00)
Chloride: 97 mmol/L — ABNORMAL LOW (ref 101–111)
GFR, EST AFRICAN AMERICAN: 48 mL/min — AB (ref >60)
GFR, EST NON AFRICAN AMERICAN: 41 mL/min — AB (ref >60)
GLUCOSE: 306 mg/dL — AB (ref 65–99)
Potassium: 5.9 mmol/L — ABNORMAL HIGH (ref 3.5–5.1)
Sodium: 140 mmol/L (ref 135–145)

## 2016-02-29 LAB — GLUCOSE, CAPILLARY
GLUCOSE-CAPILLARY: 188 mg/dL — AB (ref 65–99)
Glucose-Capillary: 246 mg/dL — ABNORMAL HIGH (ref 65–99)
Glucose-Capillary: 213 mg/dL — ABNORMAL HIGH (ref 65–99)
Glucose-Capillary: 308 mg/dL — ABNORMAL HIGH (ref 65–99)

## 2016-02-29 LAB — MAGNESIUM: Magnesium: 2 mg/dL (ref 1.7–2.4)

## 2016-02-29 LAB — PHOSPHORUS: Phosphorus: 4.8 mg/dL — ABNORMAL HIGH (ref 2.5–4.6)

## 2016-02-29 MED ORDER — STERILE WATER FOR INJECTION IJ SOLN
INTRAMUSCULAR | Status: AC
  Administered 2016-02-29: 10 mL via INTRAMUSCULAR
  Filled 2016-02-29: qty 10

## 2016-02-29 MED ORDER — NOREPINEPHRINE 4 MG/250ML-% IV SOLN
INTRAVENOUS | Status: AC
  Administered 2016-02-29: 4 mg
  Filled 2016-02-29: qty 250

## 2016-02-29 MED ORDER — PREDNISONE 20 MG PO TABS
20.0000 mg | ORAL_TABLET | Freq: Every day | ORAL | Status: AC
  Administered 2016-03-04 – 2016-03-07 (×3): 20 mg via ORAL
  Filled 2016-02-29 (×2): qty 1

## 2016-02-29 MED ORDER — PREDNISONE 20 MG PO TABS
40.0000 mg | ORAL_TABLET | Freq: Every day | ORAL | Status: AC
  Administered 2016-02-29 – 2016-03-02 (×3): 40 mg via ORAL
  Filled 2016-02-29 (×3): qty 2

## 2016-02-29 MED ORDER — FENTANYL 2500MCG IN NS 250ML (10MCG/ML) PREMIX INFUSION
25.0000 ug/h | INTRAVENOUS | Status: DC
  Administered 2016-02-29: 50 ug/h via INTRAVENOUS
  Administered 2016-03-01: 175 ug/h via INTRAVENOUS
  Administered 2016-03-01: 150 ug/h via INTRAVENOUS
  Administered 2016-03-05: 50 ug/h via INTRAVENOUS
  Administered 2016-03-06 – 2016-03-08 (×4): 150 ug/h via INTRAVENOUS
  Administered 2016-03-09: 100 ug/h via INTRAVENOUS
  Administered 2016-03-10 (×3): 50 ug/h via INTRAVENOUS
  Administered 2016-03-10 – 2016-03-11 (×3): 150 ug/h via INTRAVENOUS
  Administered 2016-03-11: 400 ug/h via INTRAVENOUS
  Administered 2016-03-12: 150 ug/h via INTRAVENOUS
  Administered 2016-03-14: 200 ug/h via INTRAVENOUS
  Administered 2016-03-14: 250 ug/h via INTRAVENOUS
  Administered 2016-03-15: 25 ug/h via INTRAVENOUS
  Administered 2016-03-15 – 2016-03-16 (×3): 250 ug/h via INTRAVENOUS
  Administered 2016-03-17: 275 ug/h via INTRAVENOUS
  Filled 2016-02-29 (×25): qty 250

## 2016-02-29 MED ORDER — FENTANYL CITRATE (PF) 100 MCG/2ML IJ SOLN
50.0000 ug | Freq: Once | INTRAMUSCULAR | Status: AC
  Administered 2016-02-29: 50 ug via INTRAVENOUS

## 2016-02-29 MED ORDER — VECURONIUM BROMIDE 10 MG IV SOLR
INTRAVENOUS | Status: AC
  Administered 2016-02-29: 10 mg via INTRAVENOUS
  Filled 2016-02-29: qty 10

## 2016-02-29 MED ORDER — CHLORHEXIDINE GLUCONATE 0.12% ORAL RINSE (MEDLINE KIT)
15.0000 mL | Freq: Two times a day (BID) | OROMUCOSAL | Status: DC
  Administered 2016-03-01 – 2016-03-05 (×9): 15 mL via OROMUCOSAL
  Filled 2016-02-29 (×14): qty 15

## 2016-02-29 MED ORDER — INSULIN ASPART 100 UNIT/ML ~~LOC~~ SOLN
0.0000 [IU] | SUBCUTANEOUS | Status: DC
  Administered 2016-02-29: 7 [IU] via SUBCUTANEOUS
  Administered 2016-03-01 (×2): 3 [IU] via SUBCUTANEOUS
  Administered 2016-03-01: 5 [IU] via SUBCUTANEOUS
  Filled 2016-02-29: qty 5
  Filled 2016-02-29: qty 3
  Filled 2016-02-29: qty 7
  Filled 2016-02-29: qty 3

## 2016-02-29 MED ORDER — LORAZEPAM 2 MG/ML IJ SOLN
0.5000 mg | Freq: Once | INTRAMUSCULAR | Status: AC
  Administered 2016-02-29: 0.5 mg via INTRAVENOUS
  Filled 2016-02-29: qty 1

## 2016-02-29 MED ORDER — MIDAZOLAM HCL 2 MG/2ML IJ SOLN
1.0000 mg | INTRAMUSCULAR | Status: DC | PRN
  Administered 2016-02-29: 1 mg via INTRAVENOUS
  Filled 2016-02-29 (×3): qty 2

## 2016-02-29 MED ORDER — IPRATROPIUM BROMIDE 0.02 % IN SOLN
0.5000 mg | Freq: Four times a day (QID) | RESPIRATORY_TRACT | Status: DC | PRN
  Administered 2016-03-02: 0.5 mg via RESPIRATORY_TRACT
  Filled 2016-02-29: qty 2.5

## 2016-02-29 MED ORDER — LEVALBUTEROL HCL 0.63 MG/3ML IN NEBU
0.6300 mg | INHALATION_SOLUTION | Freq: Four times a day (QID) | RESPIRATORY_TRACT | Status: DC | PRN
  Administered 2016-03-02 – 2016-03-03 (×3): 0.63 mg via RESPIRATORY_TRACT
  Filled 2016-02-29 (×3): qty 3

## 2016-02-29 MED ORDER — MIDAZOLAM HCL 2 MG/2ML IJ SOLN
1.0000 mg | INTRAMUSCULAR | Status: DC | PRN
  Administered 2016-02-29 – 2016-03-21 (×22): 1 mg via INTRAVENOUS
  Filled 2016-02-29 (×19): qty 2

## 2016-02-29 MED ORDER — DEXTROSE 5 % IV SOLN
0.0000 ug/min | INTRAVENOUS | Status: DC
  Administered 2016-02-29: 7 ug/min via INTRAVENOUS
  Filled 2016-02-29: qty 4

## 2016-02-29 MED ORDER — LEVOFLOXACIN 500 MG PO TABS
500.0000 mg | ORAL_TABLET | Freq: Every day | ORAL | Status: DC
  Administered 2016-02-29 – 2016-03-01 (×2): 500 mg via ORAL
  Filled 2016-02-29 (×2): qty 1

## 2016-02-29 MED ORDER — ANTISEPTIC ORAL RINSE SOLUTION (CORINZ)
7.0000 mL | Freq: Four times a day (QID) | OROMUCOSAL | Status: DC
  Administered 2016-02-29 – 2016-03-05 (×18): 7 mL via OROMUCOSAL
  Filled 2016-02-29 (×22): qty 7

## 2016-02-29 MED ORDER — VECURONIUM BROMIDE 10 MG IV SOLR
10.0000 mg | Freq: Once | INTRAVENOUS | Status: AC
  Administered 2016-02-29: 10 mg via INTRAVENOUS

## 2016-02-29 MED ORDER — PREDNISONE 10 MG PO TABS
10.0000 mg | ORAL_TABLET | Freq: Every day | ORAL | Status: DC
  Administered 2016-03-04 – 2016-03-09 (×2): 10 mg via ORAL
  Filled 2016-02-29: qty 1

## 2016-02-29 MED ORDER — FENTANYL CITRATE (PF) 100 MCG/2ML IJ SOLN
INTRAMUSCULAR | Status: AC
  Administered 2016-02-29: 50 ug via INTRAVENOUS
  Filled 2016-02-29: qty 4

## 2016-02-29 MED ORDER — STERILE WATER FOR INJECTION IJ SOLN
10.0000 mL | Freq: Once | INTRAMUSCULAR | Status: AC
  Administered 2016-02-29: 10 mL via INTRAMUSCULAR

## 2016-02-29 MED ORDER — FENTANYL BOLUS VIA INFUSION
25.0000 ug | INTRAVENOUS | Status: DC | PRN
  Filled 2016-02-29: qty 25

## 2016-02-29 MED ORDER — ANTISEPTIC ORAL RINSE SOLUTION (CORINZ)
7.0000 mL | OROMUCOSAL | Status: DC
  Filled 2016-02-29 (×5): qty 7

## 2016-02-29 MED ORDER — MIDAZOLAM HCL 2 MG/2ML IJ SOLN
INTRAMUSCULAR | Status: AC
  Administered 2016-02-29: 1 mg via INTRAVENOUS
  Filled 2016-02-29: qty 4

## 2016-02-29 MED ORDER — LABETALOL HCL 5 MG/ML IV SOLN
20.0000 mg | INTRAVENOUS | Status: DC | PRN
  Administered 2016-02-29: 20 mg via INTRAVENOUS
  Filled 2016-02-29: qty 4

## 2016-02-29 MED ORDER — PREDNISONE 20 MG PO TABS
30.0000 mg | ORAL_TABLET | Freq: Every day | ORAL | Status: AC
Start: 2016-03-03 — End: 2016-03-06
  Administered 2016-03-03 – 2016-03-05 (×2): 30 mg via ORAL
  Filled 2016-02-29 (×4): qty 1

## 2016-02-29 MED ORDER — CHLORHEXIDINE GLUCONATE 0.12% ORAL RINSE (MEDLINE KIT)
15.0000 mL | Freq: Two times a day (BID) | OROMUCOSAL | Status: DC
  Administered 2016-02-29: 15 mL via OROMUCOSAL
  Filled 2016-02-29 (×2): qty 15

## 2016-02-29 MED ORDER — SODIUM POLYSTYRENE SULFONATE 15 GM/60ML PO SUSP
30.0000 g | Freq: Once | ORAL | Status: AC
  Administered 2016-02-29: 30 g
  Filled 2016-02-29: qty 120

## 2016-02-29 NOTE — Progress Notes (Signed)
Change in telemetry monitor noted. New onset of Multifocal PVC's becoming regular. Occuring every fourth or fifth beat. Spikes noted in PVC.   Dr.Patel called. Orders given to get EKG. Will continue to assess.

## 2016-02-29 NOTE — Plan of Care (Signed)
Problem: Discharge Progression Outcomes Goal: Hemodynamically stable Outcome: Completed/Met Date Met:  02/29/16 Pt hemodynamically stable

## 2016-02-29 NOTE — Procedures (Signed)
Central Venous Catheter Insertion Procedure Note Ebony [name] [id number] 24-Oct-1946  Procedure: Insertion of Central Venous Catheter Indications: Assessment of intravascular volume, Drug and/or fluid administration and Frequent blood sampling  Procedure Details Consent: Unable to obtain consent because of emergent medical necessity. Time Out: Verified patient identification, verified procedure, site/side was marked, verified correct patient position, special equipment/implants available, medications/allergies/relevent history reviewed, required imaging and test results available.  Performed  Maximum sterile technique was used including antiseptics, cap, gloves, gown, hand hygiene, mask and sheet. Skin prep: Chlorhexidine; local anesthetic administered A antimicrobial bonded/coated triple lumen catheter was placed in the right femoral vein due to multiple attempts, no other available access using the Seldinger technique.  Evaluation Blood flow good Complications: No apparent complications Patient did tolerate procedure well. Chest X-ray ordered to verify placement.  CXR: pending.  Bincy S Varughese 02/29/2016, 8:27 PM  I was present and supervised the use practitioner for the procedure for the duration of the procedure. Marda Stalker M.D. 03/03/2016

## 2016-02-29 NOTE — Care Management (Signed)
Admitted to [facility] with the diagnosis of respiratory distress. Lives with husband, Legrand Como [phone]). Primary care physician is listed as Dr. Zack Seal at [facility]. Chronic O2 2 liters per nasal cannula. History of breast cancer and radiation. Placed on HFNC. Transferred to ICU and placed on Bi Pap. Shelbie Ammons RN MSN CCM Care Management [phone]

## 2016-02-29 NOTE — Plan of Care (Signed)
Problem: ICU Phase Progression Outcomes Goal: Pain controlled with appropriate interventions Outcome: Not Progressing New orders for morphine PRN

## 2016-02-29 NOTE — Progress Notes (Signed)
[facility] Progress Note Patient Name: Ebony [name] DOB: 08-18-1946 MRN: [id number]   Date of Service  02/29/2016  HPI/Events of Note  ABG on 60%/PRVC 20/TV 450/P 5 = 7.25/87/179. Ppeak = 41 and Pplat = 36.  eICU Interventions  High Ppeak and Pplat preclude increasing rate at this time. Will tolerate pH and pCO2. Will ask nurse to give Xopenex/Atrovent scheduled for 8 PM now.      Intervention Category Major Interventions: Acid-Base disturbance - evaluation and management;Respiratory failure - evaluation and management  Lysle Dingwall 02/29/2016, 6:55 PM

## 2016-02-29 NOTE — Progress Notes (Signed)
PULMONARY / CRITICAL CARE MEDICINE   Name: Ebony [name] MRN: [id number] DOB: May 23, 1946    ADMISSION DATE:  02/27/2016 Consulting Physician - Dr. Margaretmary Eddy   BRIEF HISTORY: Ebony [name] is a 70 year old female with medical history of HTN, DM,chronic respiratory failure due to COPD,sleep apnea, Hyperlipidemia, hypothyroidism, depression, morbid obesity who presented to [facility] on 4/16 with increased shortness of breath . She was placed on BiPAP and transitioned to HFNC, but the patient continues to complaint of increased shortness of breath, tightness, patient had a hard time to speak in full sentences. Patient was transferred to ICU for further and PCCM team was consulted for further management. Patient denies any chest pain, nausea, vomiting, dizziness , diaphoresis.  SUBJECTIVE:  Awake this morning, more alert per RN staff.  Tolerating HFNC, when not on bipap   SIGNIFICANT EVENTS: 4/17>>AECOPD, Bronchitis, requiring continuous Bipap 4/18>>intermitted bipap, bridge with HFNC  VITAL SIGNS: Temp:  [98.6 F (37 C)-99.8 F (37.7 C)] 99.6 F (37.6 C) (04/18 0800) Pulse Rate:  [74-120] 110 (04/18 0900) Resp:  [19-39] 20 (04/18 0900) BP: (97-155)/(50-101) 144/81 mmHg (04/18 0900) SpO2:  [91 %-100 %] 95 % (04/18 0900) FiO2 (%):  [40 %-50 %] 50 % (04/18 0900) Weight:  [229 lb 8 oz (104.1 kg)] 229 lb 8 oz (104.1 kg) (04/17 1400) HEMODYNAMICS:   VENTILATOR SETTINGS: Vent Mode:  [-]  FiO2 (%):  [40 %-50 %] 50 % INTAKE / OUTPUT:  Intake/Output Summary (Last 24 hours) at 02/29/16 1143 Last data filed at 02/29/16 0948  Gross per 24 hour  Intake    483 ml  Output      0 ml  Net    483 ml    Review of Systems  Constitutional: Negative for fever, chills, malaise/fatigue and diaphoresis.  HENT: Negative for sore throat.   Eyes: Negative for blurred vision.  Respiratory: Positive for cough, shortness of breath and wheezing. Negative for sputum production and stridor.   Cardiovascular:  Negative for chest pain and palpitations.  Gastrointestinal: Negative for nausea.  Musculoskeletal: Negative for myalgias.  Neurological: Negative for dizziness.  Psychiatric/Behavioral: Negative for depression.    Physical Exam General: Sickly appearing white female Neuro: Awake, alert , oriented, follows command HEENT: Atraumatic, normocephalic,no discharge Cardiovascular: S1S2, RRR. [place] Lungs: Diminished bilaterally, symettrical chest expansion,improving BS at the bases Abdomen: obese, soft, non tender, good bowel sounds Musculoskeletal: good muscle tone, no inflamation, deformity noted. Skin: Grossly intact  LABS:  CBC  Recent Labs Lab 02/27/16 0718 02/28/16 0548  WBC 9.4 8.3  HGB 14.1 14.0  HCT 41.7 42.3  PLT 244 242   Coag's No results for input(s): APTT, INR in the last 168 hours. BMET  Recent Labs Lab 02/27/16 0718 02/28/16 0548  NA 134* 137  K 3.2* 3.8  CL 93* 99*  CO2 29 33*  BUN 19 23*  CREATININE 0.81 0.60  GLUCOSE 269* 182*   Electrolytes  Recent Labs Lab 02/27/16 0718 02/28/16 0548  CALCIUM 9.4 9.7   Sepsis Markers No results for input(s): LATICACIDVEN, PROCALCITON, O2SATVEN in the last 168 hours. ABG  Recent Labs Lab 02/27/16 0716 02/27/16 1028  PHART 7.40 7.34*  PCO2ART 48 57*  PO2ART 46* 115*   Liver Enzymes  Recent Labs Lab 02/27/16 0718  AST 64*  ALT 63*  ALKPHOS 37*  BILITOT 0.6  ALBUMIN 4.0   Cardiac Enzymes  Recent Labs Lab 02/27/16 0718  TROPONINI <0.03   Glucose  Recent Labs Lab 02/28/16 0756 02/28/16 1126  02/28/16 1632 02/28/16 2011 02/29/16 0733 02/29/16 1116  GLUCAP 135* 148* 57* 103* 188* 246*    Imaging No results found.  STUDIES:  4/4/ 17 >> CT chest was Negative for acute pulmonary embolism.Mild nodularity in the lung bases, with resolution of nodularopacities observed in November and development of a new 6 mm rightlower lobe nodule. This is most likely inflammatory given  theinterval changes.  CULTURES: none  ANTIBIOTICS: 4/16 levaquin>>  SIGNIFICANT EVENTS: 4/17> Transferred to ICU  LINES/TUBES: NONE  DISCUSSION: 70 year old female with known history of chronic respiratory failure due to COPD, Sleep apnea, hyperlipidemia, now with admitted with AECOPD/Bronchitis  ASSESSMENT / PLAN:  PULMONARY A: Acute on chronic respiratory failure COPD exacerbation with acute bronchiolitis Right lower lobe lung nodule (benign and old) P:  Continue levaquin (7 days total) Continue solumedrol, transition to PO (Prednisone 40mg , wean over 14 days) Use BiPAP prn to keep O2 sats> 88%, intermittent use of HFNC Continue pulmicort/xopnex/ atrovent - plan for scheduled nebs for another day, then PRN Dispo - bipap at night and prn, HFNC when not on bipap - anticipate this regiment for another 1-2 days.    CARDIOVASCULAR A:  Hypertension, Sinus Tachycardia Hyperlipidemia P:  Diltiazem PRN Continue Simvastatin Continue home dose lasix Continue hyzaar   RENAL A:  No active issues  P:  Monitor electrolytes Replace lytes per protocol  GASTROINTESTINAL A:  No active issues P:  On HH diet HEMATOLOGIC A:  No active issues P:  lovenox for DVT prophylaxis SCD'S  INFECTIOUS A:  No signs of active infection P:  Started on levauin  ENDOCRINE A:  Diabetese Melitus Hypothyroidism P:  B Continue oral hypoglycemics Continue synthroid  NEUROLOGIC A:  No active issues Depression P:  RASS goal:0 Management per primary team   FAMILY  - Updates: No family member present at the bedside    I have personally obtained a history, examined the patient, evaluated laboratory and imaging results, formulated the assessment and plan and placed orders.  Pulmonary Care Time devoted to patient care services described in this note is 40 minutes.    Vilinda Boehringer, MD  Pulmonary and Critical Care Pager [phone] (please enter 7-digits) On Call Pager - [phone] (please enter 7-digits)

## 2016-02-29 NOTE — Progress Notes (Signed)
Pt in stable condition at this time. Pt on 7 mcg/min of Levophed, 100 mcg/hr Fentanyl. Pt HR remains SR in the 70's. Pt now on ventilator and synchronous.  Elink notified of critical ABG result. New orders to give albuterol nebulizer early.   Report given to oncoming RN.

## 2016-02-29 NOTE — Procedures (Signed)
Procedure Note:  Orotracheal Intubation  Implied consent due to emergent nature of patient's condition. Correct Patient, Name & ID confirmed.  The patient was pre-oxygenated and then, under direct visualization, a 7.5 mm cuffed endotracheal tube was placed through the vocal cords into the trachea, using the Glidescope.  Total attempts made 1, using Glidescope, mild thin secretions noted around the cords.  During intubation an assistant applied gentle pressure to the cricoid cartilage.  Position confirmed by auscultation of lungs (good breath sounds bilaterally) and no stomach sounds.  Tube secured at 23 cm. Pulse ox 98 %. CO2 detector in place with appropriate color change.   Pt tolerated procedure well.  No complications were noted.   CXR ordered.   Patient intubated by NP Sallyanne Havers, in the presence of Dr. Stevenson Clinch.    Vilinda Boehringer, MD [facility] Pager [phone] On Call Pager [phone]

## 2016-02-29 NOTE — Progress Notes (Signed)
Pt  Oriented x4 lying in bed resting quietly  at present pt cont on bipap at 40 %. Pt has been off bipap for short amounts of time and sats remained >90 but py has increased WOB using accessory muscles to breathe. Pt received morphine this shift which patient stated helped her pain and breathing . Pt  Was starting to have some anxiety received 1x dose of ativan which was effective,further assessments via flowsheets

## 2016-02-29 NOTE — Progress Notes (Signed)
PHARMACIST - PHYSICIAN COMMUNICATION DR:   Patel CONCERNING: Antibiotic IV to Oral Route Change Policy  RECOMMENDATION: This patient is receiving Levaquin by the intravenous route.  Based on criteria approved by the Pharmacy and Therapeutics Committee, the antibiotic(s) is/are being converted to the equivalent oral dose form(s).   DESCRIPTION: These criteria include:  Patient being treated for a respiratory tract infection, urinary tract infection, cellulitis or clostridium difficile associated diarrhea if on metronidazole  The patient is not neutropenic and does not exhibit a GI malabsorption state  The patient is eating (either orally or via tube) and/or has been taking other orally administered medications for a least 24 hours  The patient is improving clinically and has a Tmax < 100.5  If you have questions about this conversion, please contact the Pharmacy Department  []  ( [phone] )  [place] [x]  ( [phone] )  [facility] []  ( [phone] )  [place] []  ( [phone] )  [facility] []  ( [phone] )  [facility]   Erron Wengert, PharmD Clinical Pharmacist  

## 2016-02-29 NOTE — Plan of Care (Signed)
Problem: Education: Goal: Knowledge of [facility] Education information/materials will improve Outcome: Progressing Spoke with pt and family about plan of care and offered welcome packet. Also oriented pt and family to the unit.

## 2016-02-29 NOTE — Progress Notes (Signed)
eLink Physician-Brief Progress Note Patient Name: Ebony [name] DOB: 1946/08/03 MRN: [id number]   Date of Service  02/29/2016  HPI/Events of Note  Multiple issues: 1. Blood glucose = 308. Already on Levimir insulin and AC sensitive Novolog SSI and 2. K+ = 5.9.  eICU Interventions  Will order: 1. Change to Q 4 hour sensitive Novolog SSI.  2. Kayexalate 30 gm via tube now.      Intervention Category Major Interventions: Electrolyte abnormality - evaluation and management Intermediate Interventions: Hyperglycemia - evaluation and treatment  Sommer,Steven Cornelia Copa 02/29/2016, 9:49 PM

## 2016-02-29 NOTE — Progress Notes (Addendum)
1645 - Upon entering room pt was noted to pale, more lethargic, and to have a greatly increased work of breathing. Dr.Mungal was called to bedside. New orders given.

## 2016-02-29 NOTE — Progress Notes (Signed)
[place] [facility] [place] NAME: Ebony [name]    MR#:  [id number]  DATE OF BIRTH:  Jul 15, 1946  SUBJECTIVE:  Sitting at the edge of the bed. On HFNC. Received some ativan and morphine which helps her  REVIEW OF SYSTEMS:  CONSTITUTIONAL: No fever, fatigue or weakness.  EYES: No blurred or double vision.  EARS, NOSE, AND THROAT: No tinnitus or ear pain.  RESPIRATORY: Reports cough, shortness of breath while resting, wheezing denies hemoptysis.  CARDIOVASCULAR: No chest pain, orthopnea, edema.  GASTROINTESTINAL: No nausea, vomiting, diarrhea or abdominal pain.  GENITOURINARY: No dysuria, hematuria.  ENDOCRINE: No polyuria, nocturia,  HEMATOLOGY: No anemia, easy bruising or bleeding SKIN: No rash or lesion. MUSCULOSKELETAL: No joint pain or arthritis.   NEUROLOGIC: No tingling, numbness, weakness.  PSYCHIATRY: No anxiety or depression.   DRUG ALLERGIES:   Allergies  Allergen Reactions  . Other Hives and Rash    Shellfish. Shellfish Pt reports no allergic reaction to iodine or betadine.    VITALS:  Blood pressure 172/99, pulse 122, temperature 98.4 F (36.9 C), temperature source Oral, resp. rate 22, height 5\' 3"  (1.6 m), weight 104.1 kg (229 lb 8 oz), SpO2 97 %.  PHYSICAL EXAMINATION:  GENERAL:  70 y.o.-year-old patient lying in the bed with no acute distress. Obese EYES: Pupils equal, round, reactive to light and accommodation. No scleral icterus. Extraocular muscles intact.  HEENT: Head atraumatic, normocephalic. Oropharynx and nasopharynx clear.  NECK:  Supple, no jugular venous distention. No thyroid enlargement, no tenderness.  LUNGS: Minimal breath sounds bilaterally,  minimal wheezing, no rales,rhonchi or crepitation. Positive use of accessory muscles of respiration.  CARDIOVASCULAR: S1, S2 normal. No murmurs, rubs, or gallops.  ABDOMEN: Soft, nontender, nondistended. Bowel sounds present. No organomegaly or mass.   EXTREMITIES: No pedal edema, cyanosis, or clubbing.  NEUROLOGIC: Cranial nerves II through XII are intact. Muscle strength 5/5 in all extremities. Sensation intact. Gait not checked.  PSYCHIATRIC: The patient is alert and oriented x 3.  SKIN: No obvious rash, lesion, or ulcer.    LABORATORY PANEL:   CBC  Recent Labs Lab 02/28/16 0548  WBC 8.3  HGB 14.0  HCT 42.3  PLT 242   ------------------------------------------------------------------------------------------------------------------  Chemistries   Recent Labs Lab 02/27/16 0718 02/28/16 0548 02/29/16 1510  NA 134* 137  --   K 3.2* 3.8  --   CL 93* 99*  --   CO2 29 33*  --   GLUCOSE 269* 182*  --   BUN 19 23*  --   CREATININE 0.81 0.60  --   CALCIUM 9.4 9.7  --   MG  --   --  2.0  AST 64*  --   --   ALT 63*  --   --   ALKPHOS 37*  --   --   BILITOT 0.6  --   --    ------------------------------------------------------------------------------------------------------------------  Cardiac Enzymes  Recent Labs Lab 02/27/16 0718  TROPONINI <0.03   ------------------------------------------------------------------------------------------------------------------  RADIOLOGY:  No results found.  EKG:   Orders placed or performed during the hospital encounter of 02/27/16  . ED EKG  . ED EKG  . EKG 12-Lead  . EKG 12-Lead    ASSESSMENT AND PLAN:   1. Acute hypoxic respiratory failure due to acute on chronic COPD exacerbation with associated acute bronchitis Cont IV solumederol Due to patient's heart rate being elevated I'll place on Xopenex Continue high flow oxygen will be changed to BiPAP Pulmonary  consult appreciated We'll start her on nebulizer treatments  2. Hypokalemia repleted, potassium at 3.8 today  3. Diabetes type 2 continue high-dose Levemir I will place her on sliding scale patient also on her on high-dose pre-meal insulin which I will hold for now but will need to be resumed Continue  her oral regimen  4. Hypothyroidism continue high-dose Synthroid  5. Sinus tachycardia will place her on when necessary Cardizem IV  6. hyperLipidemia continue simvastatin  Management plans discussed with the patient, husband and they are in agreement.  Greater than 50% time was spent on face-to-face education, counseling and coordination of care   All the records are reviewed and case discussed with Care Management/Social Workerr. Management plans discussed with the patient, family and they are in agreement.  CODE STATUS: Full code, husband is the healthcare power of attorney  TOTAL CRITICAL CARE TIME TAKING CARE OF THIS PATIENT:30 minutes.   POSSIBLE D/C IN 2-3 DAYS, DEPENDING ON CLINICAL CONDITION.   Rockwell Zentz M.D on 02/29/2016 at 5:12 PM  Between 7am to 6pm - Pager - [phone] After 6pm go to [email or web address] - password [email or web address] [facility]  Office  [phone]  CC: Primary care physician; Renee Rival, NP

## 2016-02-29 NOTE — Progress Notes (Signed)
Inpatient Diabetes Program Recommendations  AACE/ADA: New Consensus Statement on Inpatient Glycemic Control (2015)  Target Ranges:  Prepandial:   less than 140 mg/dL      Peak postprandial:   less than 180 mg/dL (1-2 hours)      Critically ill patients:  140 - 180 mg/dL   Review of Glycemic Control:  Results for Ebony [name], YANCE (MRN [id number]) as of 02/29/2016 08:38  Ref. Range 02/28/2016 07:56 02/28/2016 11:26 02/28/2016 16:32 02/28/2016 20:11 02/29/2016 07:33  Glucose-Capillary Latest Ref Range: 65-99 mg/dL 135 (H) 148 (H) 57 (L) 103 (H) 188 (H)    Diabetes history: Type 2 diabetes Outpatient Diabetes medications: Levemir 85 units bid, Glucotrol 5 mg with lunch, Humalog 38-48 units tid with meals, Invokana 100 mg daily Current orders for Inpatient glycemic control:  Novolog sensitive tid with meals, Glucotrol 5 mg with lunch, Levemir 85 units bid, Solumedrol 40 mg IV q 12 hours  Inpatient Diabetes Program Recommendations:    Please consider reducing Levemir to 45 units bid.  Also please hold Glucotrol while patient is in the hospital.   Thanks, Adah Perl, RN, BC-ADM Inpatient Diabetes Coordinator Pager [phone] (8a-5p)

## 2016-02-29 NOTE — Progress Notes (Signed)
Update - Patient noted to be more lethargic and increase respiratory distress on the bipap - stat abg showed 7.25/93 - respiratory acidosis.  Patient emergently intubated.   Vilinda Boehringer, MD [facility] Pager [phone] (please enter 7-digits) On Call Pager - [phone] (please enter 7-digits)

## 2016-03-01 DIAGNOSIS — N179 Acute kidney failure, unspecified: Secondary | ICD-10-CM

## 2016-03-01 LAB — CBC WITH DIFFERENTIAL/PLATELET
BASOS ABS: 0 10*3/uL (ref 0–0.1)
BASOS PCT: 0 %
Eosinophils Absolute: 0 10*3/uL (ref 0–0.7)
Eosinophils Relative: 0 %
HEMATOCRIT: 40.6 % (ref 35.0–47.0)
HEMOGLOBIN: 13.2 g/dL (ref 12.0–16.0)
LYMPHS PCT: 8 %
Lymphs Abs: 0.9 10*3/uL — ABNORMAL LOW (ref 1.0–3.6)
MCH: 29.8 pg (ref 26.0–34.0)
MCHC: 32.5 g/dL (ref 32.0–36.0)
MCV: 91.6 fL (ref 80.0–100.0)
Monocytes Absolute: 1.2 10*3/uL — ABNORMAL HIGH (ref 0.2–0.9)
Monocytes Relative: 10 %
NEUTROS ABS: 9.7 10*3/uL — AB (ref 1.4–6.5)
NEUTROS PCT: 82 %
Platelets: 252 10*3/uL (ref 150–440)
RBC: 4.44 MIL/uL (ref 3.80–5.20)
RDW: 13.5 % (ref 11.5–14.5)
WBC: 11.8 10*3/uL — AB (ref 3.6–11.0)

## 2016-03-01 LAB — BASIC METABOLIC PANEL
ANION GAP: 7 (ref 5–15)
BUN: 51 mg/dL — ABNORMAL HIGH (ref 6–20)
CALCIUM: 9.6 mg/dL (ref 8.9–10.3)
CO2: 36 mmol/L — ABNORMAL HIGH (ref 22–32)
Chloride: 98 mmol/L — ABNORMAL LOW (ref 101–111)
Creatinine, Ser: 1.82 mg/dL — ABNORMAL HIGH (ref 0.44–1.00)
GFR, EST AFRICAN AMERICAN: 32 mL/min — AB (ref >60)
GFR, EST NON AFRICAN AMERICAN: 28 mL/min — AB (ref >60)
Glucose, Bld: 242 mg/dL — ABNORMAL HIGH (ref 65–99)
POTASSIUM: 4.6 mmol/L (ref 3.5–5.1)
Sodium: 141 mmol/L (ref 135–145)

## 2016-03-01 LAB — BLOOD GAS, ARTERIAL
ACID-BASE EXCESS: 7.3 mmol/L — AB (ref 0.0–3.0)
ALLENS TEST (PASS/FAIL): POSITIVE — AB
Acid-Base Excess: 5.6 mmol/L — ABNORMAL HIGH (ref 0.0–3.0)
Allens test (pass/fail): POSITIVE — AB
BICARBONATE: 35.4 meq/L — AB (ref 21.0–28.0)
Bicarbonate: 35.6 mEq/L — ABNORMAL HIGH (ref 21.0–28.0)
FIO2: 0.35
FIO2: 0.45
LHR: 20 {breaths}/min
Mechanical Rate: 24
O2 SAT: 85.5 %
O2 SAT: 97.4 %
PATIENT TEMPERATURE: 37
PCO2 ART: 77 mmHg — AB (ref 32.0–48.0)
PEEP/CPAP: 5 cmH2O
PEEP/CPAP: 5 cmH2O
PH ART: 7.34 — AB (ref 7.350–7.450)
Patient temperature: 37
RATE: 24 resp/min
VT: 450 mL
VT: 450 mL
pCO2 arterial: 66 mmHg — ABNORMAL HIGH (ref 32.0–48.0)
pH, Arterial: 7.27 — ABNORMAL LOW (ref 7.350–7.450)
pO2, Arterial: 58 mmHg — ABNORMAL LOW (ref 83.0–108.0)
pO2, Arterial: 101 mmHg (ref 83.0–108.0)

## 2016-03-01 LAB — GLUCOSE, CAPILLARY
GLUCOSE-CAPILLARY: 261 mg/dL — AB (ref 65–99)
GLUCOSE-CAPILLARY: 238 mg/dL — AB (ref 65–99)
GLUCOSE-CAPILLARY: 240 mg/dL — AB (ref 65–99)
GLUCOSE-CAPILLARY: 329 mg/dL — AB (ref 65–99)
Glucose-Capillary: 187 mg/dL — ABNORMAL HIGH (ref 65–99)
Glucose-Capillary: 204 mg/dL — ABNORMAL HIGH (ref 65–99)
Glucose-Capillary: 66 mg/dL (ref 65–99)

## 2016-03-01 LAB — PHOSPHORUS: PHOSPHORUS: 4 mg/dL (ref 2.5–4.6)

## 2016-03-01 LAB — MAGNESIUM: Magnesium: 2.1 mg/dL (ref 1.7–2.4)

## 2016-03-01 MED ORDER — INSULIN ASPART 100 UNIT/ML ~~LOC~~ SOLN
0.0000 [IU] | SUBCUTANEOUS | Status: DC
  Administered 2016-03-01 (×2): 7 [IU] via SUBCUTANEOUS
  Administered 2016-03-01: 15 [IU] via SUBCUTANEOUS
  Administered 2016-03-02: 4 [IU] via SUBCUTANEOUS
  Administered 2016-03-02: 7 [IU] via SUBCUTANEOUS
  Administered 2016-03-02: 4 [IU] via SUBCUTANEOUS
  Filled 2016-03-01 (×2): qty 7
  Filled 2016-03-01 (×2): qty 4
  Filled 2016-03-01: qty 7
  Filled 2016-03-01: qty 15

## 2016-03-01 MED ORDER — POTASSIUM CHLORIDE CRYS ER 20 MEQ PO TBCR
40.0000 meq | EXTENDED_RELEASE_TABLET | Freq: Every day | ORAL | Status: DC
  Filled 2016-03-01: qty 2

## 2016-03-01 MED ORDER — SODIUM CHLORIDE 0.9 % IV SOLN
INTRAVENOUS | Status: DC
  Administered 2016-03-01 – 2016-03-06 (×8): via INTRAVENOUS

## 2016-03-01 MED ORDER — LEVOFLOXACIN 500 MG PO TABS
250.0000 mg | ORAL_TABLET | Freq: Every day | ORAL | Status: DC
  Administered 2016-03-02: 250 mg via ORAL
  Filled 2016-03-01: qty 1

## 2016-03-01 MED ORDER — PANTOPRAZOLE SODIUM 40 MG IV SOLR
40.0000 mg | INTRAVENOUS | Status: DC
  Administered 2016-03-01 – 2016-03-06 (×6): 40 mg via INTRAVENOUS
  Filled 2016-03-01 (×6): qty 40

## 2016-03-01 MED ORDER — VITAL HIGH PROTEIN PO LIQD
1000.0000 mL | ORAL | Status: DC
  Administered 2016-03-01 – 2016-03-20 (×20): 1000 mL

## 2016-03-01 MED ORDER — NOREPINEPHRINE 4 MG/250ML-% IV SOLN
0.0000 ug/min | INTRAVENOUS | Status: DC
  Administered 2016-03-01: 7 ug/min via INTRAVENOUS
  Administered 2016-03-01: 13 ug/min via INTRAVENOUS
  Administered 2016-03-01: 12 ug/min via INTRAVENOUS
  Administered 2016-03-02 – 2016-03-03 (×3): 9 ug/min via INTRAVENOUS
  Administered 2016-03-03: 13 ug/min via INTRAVENOUS
  Administered 2016-03-04: 9 ug/min via INTRAVENOUS
  Filled 2016-03-01 (×11): qty 250

## 2016-03-01 NOTE — Progress Notes (Signed)
PULMONARY / CRITICAL CARE MEDICINE   Name: Ebony [name] MRN: [id number] DOB: 01/04/1946    ADMISSION DATE:  02/27/2016 Consulting Physician - Dr. Margaretmary Eddy   BRIEF HISTORY: Ebony [name] is a 70 year old female with medical history of HTN, DM,chronic respiratory failure due to COPD,sleep apnea, Hyperlipidemia, hypothyroidism, depression, morbid obesity who presented to [facility] on 4/16 with increased shortness of breath . She was placed on BiPAP and transitioned to HFNC, but the patient continues to complaint of increased shortness of breath, tightness, patient had a hard time to speak in full sentences. Patient was transferred to ICU for further and PCCM team was consulted for further management. Patient denies any chest pain, nausea, vomiting, dizziness , diaphoresis. Patient was found to be lethargic and had increased work of breathing therefore was intubated on 02/28/18  SUBJECTIVE:  Patient remains orally intubated and on norepi and fentanyl drip.  Patient is able to open eyes to voice and nods appropriately to questions.   SIGNIFICANT EVENTS: 4/17>>AECOPD, Bronchitis, requiring continuous Bipap 4/18>>intermitted bipap, bridge with HFNC  VITAL SIGNS: Temp:  [98.3 F (36.8 C)-99.9 F (37.7 C)] 98.3 F (36.8 C) (04/19 1220) Pulse Rate:  [64-122] 71 (04/19 1200) Resp:  [20-27] 24 (04/19 1200) BP: (83-172)/(45-99) 127/58 mmHg (04/19 1200) SpO2:  [93 %-99 %] 98 % (04/19 1200) FiO2 (%):  [35 %-60 %] 45 % (04/19 1150) HEMODYNAMICS:   VENTILATOR SETTINGS: Vent Mode:  [-] PRVC FiO2 (%):  [35 %-60 %] 45 % Set Rate:  [20 bmp-24 bmp] 24 bmp Vt Set:  [450 mL] 450 mL PEEP:  [5 cmH20] 5 cmH20 Plateau Pressure:  [22 cmH20-30 cmH20] 30 cmH20 INTAKE / OUTPUT:  Intake/Output Summary (Last 24 hours) at 03/01/16 1305 Last data filed at 03/01/16 1220  Gross per 24 hour  Intake 1213.7 ml  Output   1000 ml  Net  213.7 ml    Review of Systems  Constitutional: Negative for fever, chills,  malaise/fatigue and diaphoresis.  HENT: Negative for sore throat.   Eyes: Negative for blurred vision.  Respiratory: Positive for cough, shortness of breath and wheezing. Negative for sputum production and stridor.   Cardiovascular: Negative for chest pain and palpitations.  Gastrointestinal: Negative for nausea.  Musculoskeletal: Negative for myalgias.  Neurological: Negative for dizziness.  Psychiatric/Behavioral: Negative for depression.    Physical Exam General: Sickly appearing white female Neuro: Opens eyes to voice, lethargic, , nods appropriately to yes or no questions HEENT: Atraumatic, normocephalic,no discharge Cardiovascular: S1S2, RRR. [place] Lungs: Diminished bilaterally, symettrical chest expansion,no wheezes, crackles, rhonchi noted Abdomen: obese, soft, non tender, good bowel sounds Musculoskeletal: good muscle tone, no inflamation, deformity noted. Skin: Grossly intact  LABS:  CBC  Recent Labs Lab 02/27/16 0718 02/28/16 0548 03/01/16 0537  WBC 9.4 8.3 11.8*  HGB 14.1 14.0 13.2  HCT 41.7 42.3 40.6  PLT 244 242 252   Coag's No results for input(s): APTT, INR in the last 168 hours. BMET  Recent Labs Lab 02/28/16 0548 02/29/16 2107 03/01/16 0537  NA 137 140 141  K 3.8 5.9* 4.6  CL 99* 97* 98*  CO2 33* 35* 36*  BUN 23* 37* 51*  CREATININE 0.60 1.30* 1.82*  GLUCOSE 182* 306* 242*   Electrolytes  Recent Labs Lab 02/28/16 0548 02/29/16 1510 02/29/16 2107 03/01/16 0537  CALCIUM 9.7  --  9.6 9.6  MG  --  2.0  --  2.1  PHOS  --  4.8*  --  4.0   Sepsis Markers No  results for input(s): LATICACIDVEN, PROCALCITON, O2SATVEN in the last 168 hours. ABG  Recent Labs Lab 02/29/16 1634 02/29/16 1842 03/01/16 0900  PHART 7.26* 7.25* 7.27*  PCO2ART 93* 87* 77*  PO2ART 79* 179* 58*   Liver Enzymes  Recent Labs Lab 02/27/16 0718  AST 64*  ALT 63*  ALKPHOS 37*  BILITOT 0.6  ALBUMIN 4.0   Cardiac Enzymes  Recent Labs Lab  02/27/16 0718  TROPONINI <0.03   Glucose  Recent Labs Lab 02/29/16 1323 02/29/16 2139 03/01/16 0108 03/01/16 0449 03/01/16 0827 03/01/16 1138  GLUCAP 213* 308* 261* 238* 204* 240*    Imaging Dg Chest 1 View  02/29/2016  CLINICAL DATA:  Status post intubation today. EXAM: CHEST 1 VIEW COMPARISON:  Single view of the chest 02/27/2016. FINDINGS: New endotracheal tube is in place with the tip in good position just below the clavicular heads. A new NG tube is also identified. The tip of a tube projects over the left lower chest this may be the patient's NG tube. Lungs appear clear. Heart size is normal. No pneumothorax or pleural effusion. IMPRESSION: ET tube in good position. NG tube position is indeterminate. Recommend dedicated plain film of the abdomen. Clear lungs. These results will be called to the ordering clinician or representative by the Radiologist Assistant, and communication documented in the PACS or zVision Dashboard. Electronically Signed   By: Inge Rise M.D.   On: 02/29/2016 20:02   Dg Abd 1 View  02/29/2016  CLINICAL DATA:  NG tube placement.  Femoral line placement. EXAM: ABDOMEN - 1 VIEW COMPARISON:  CT 02/15/2016 FINDINGS: Tip and side port of the enteric tube below the diaphragm in the stomach. No dilated bowel loops to suggest obstruction. Air throughout normal caliber small and large bowel. Tip of the right femoral line projects over the region of the common iliac vein. No evidence of acute osseous abnormality. IMPRESSION: 1. Tip and side port of the enteric tube below the diaphragm in the stomach. 2. Tip of the right femoral catheter projects over the expected location of the proximal right common iliac vein. Electronically Signed   By: Jeb Levering M.D.   On: 02/29/2016 20:01    STUDIES:  4/4/ 17 >> CT chest was Negative for acute pulmonary embolism.Mild nodularity in the lung bases, with resolution of nodularopacities observed in November and development  of a new 6 mm rightlower lobe nodule. This is most likely inflammatory given theinterval changes.  CULTURES: none  ANTIBIOTICS: 4/16 levaquin>>  SIGNIFICANT EVENTS: 4/17> Transferred to ICU  LINES/TUBES: NONE  DISCUSSION: 70 year old female with known history of chronic respiratory failure due to COPD, Sleep apnea, hyperlipidemia, now with admitted with AECOPD/Bronchitis  ASSESSMENT / PLAN:  PULMONARY A: Acute on chronic respiratory failure COPD exacerbation with acute bronchiolitis Right lower lobe lung nodule (benign and old) P:  Continue levaquin for (7 days total) day #5  SBT trials in am as tolerated by the patient with goals of extubation Continue pulmicort/xopnex/ atrovent Continue steroid taper over 14 days    CARDIOVASCULAR A:  Shock Hypertension, Sinus Tachycardia Hyperlipidemia P:  Diltiazem PRN Continue Simvastatin Hold hyzaar   RENAL A:  Acute kidne injury P:  Monitor electrolytes Replace lytes per protocol N/S at 38ml/hr  GASTROINTESTINAL A:  No active issues P:  NPO  Started on trickle tube feeds Protonix for GIP HEMATOLOGIC A:  No active issues P:  lovenox for DVT prophylaxis SCD'S  INFECTIOUS A:  No signs of active infection P:  levaquin for 7days, day#5  ENDOCRINE A:  Diabetese Melitus Hypothyroidism P:  Stop oral hypoglycemics If glucose continues to be over 200 mg will switch to ICU glycemic control as per diabetes co-ordinator. Continue synthroid  NEUROLOGIC A:  No active issues Depression P:  RASS goal:0 -1 Management per primary team   Bincy Varughese,AG-ACNP Pulmonary & Critical Care

## 2016-03-01 NOTE — Progress Notes (Signed)
Patient ID: Ebony [name], female   DOB: 11/17/1945, 70 y.o.   MRN: [id number] Patient transferred under PCCM. Got intubated and on the vent day 2

## 2016-03-01 NOTE — Progress Notes (Signed)
Pharmacy Antibiotic Note  Ebony [name] is a 70 y.o. female admitted on 02/27/2016 with AECOPD/bronchitis.  Pharmacy has been consulted for Levaquin dosing.  Plan: After discussion with Dr. Stevenson Clinch will treat with Levaquin for a total of 10 days. Will adjust Levaquin dosing to 250 mg po q 24 hours for renal function.   Height: 5\' 3"  (160 cm) Weight: 229 lb 8 oz (104.1 kg) IBW/kg (Calculated) : 52.4  Temp (24hrs), Avg:99.1 F (37.3 C), Min:98.3 F (36.8 C), Max:99.9 F (37.7 C)   Recent Labs Lab 02/27/16 0718 02/28/16 0548 02/29/16 2107 03/01/16 0537  WBC 9.4 8.3  --  11.8*  CREATININE 0.81 0.60 1.30* 1.82*    Estimated Creatinine Clearance: 34.6 mL/min (by C-G formula based on Cr of 1.82).    Allergies  Allergen Reactions  . Other Hives and Rash    Shellfish. Shellfish Pt reports no allergic reaction to iodine or betadine.  . Shellfish Allergy Hives and Rash    Antimicrobials this admission: Levaquin 4/16 >>   Dose adjustments this admission: Levaquin decreased to 250 mg daily 4/19  Microbiology results: 4/17 MRSA PCR: negative  Thank you for allowing pharmacy to be a part of this patient's care.  Napoleon Form 03/01/2016 3:57 PM

## 2016-03-01 NOTE — Progress Notes (Signed)
Notified NP- Bingy that patient's urine output has only been 162ml since 7 am- patient on levophed at 11 mcqs weaning down as possible-no new orders at this time.

## 2016-03-01 NOTE — Progress Notes (Signed)
Spoke to Dr. Stevenson Clinch- notified that patient followed commands and tolerated vent setting while off sedation and performing sedation vacation- but ABG did not have much improvement- he ordered that respiratory rate to increase along with the oxygen- also to keep patient sedated and comfor  Also notified him that patient's blood sugars are still in the 200's- Dr. Stevenson Clinch stated that he will speak to pharmacy about changing the insulin orders.  Also he stated that [facility] with dietary can start tube feeds.

## 2016-03-01 NOTE — Progress Notes (Signed)
Initial Nutrition Assessment  DOCUMENTATION CODES:   Morbid obesity  INTERVENTION:  -Consult received for initiation of Tube Feeding; recommend Vital High Protein at initial rate of 20 ml/hr with current goal of 60 ml/hr providing 1440 kcals, 127 g of protein, 1210 mL of free water.Meets 100% estimated protein/calorie needs as per ASPEN guidelines. Maintenance flushes of 30 mL q 4 hours ordered per Adult Tube Feeding Protocol. Continue to asses  NUTRITION DIAGNOSIS:   Inadequate oral intake related to acute illness as evidenced by NPO status.  GOAL:   Provide needs based on ASPEN/SCCM guidelines  MONITOR:   Vent status, TF tolerance, Labs, Weight trends, I & O's  REASON FOR ASSESSMENT:   Consult, Ventilator Enteral/tube feeding initiation and management  ASSESSMENT:   70 yo female admitted with acute respiratory failure duw to acute on chronic COPD exacerbation with associated acute bronchitis, initially on Bipap, required intubation on 02/29/16.  Family reports pt had breast lumpectomy, biopsy peformed in November 2016. Family reports pt appetite has been down for a while, not really eating full meals, mainly snacking through out the day  Past Medical History  Diagnosis Date  . Hyperlipidemia   . Hypertension   . Diabetes mellitus type 2, uncomplicated ([place])   . Hypothyroidism   . Psoriasis   . Depression   . COPD (chronic obstructive pulmonary disease) ([place])   . Osteoporosis, post-menopausal   . Vitamin D deficiency   . Sleep apnea   . Carpal tunnel syndrome   . Breast cancer ([place])   . Asthma     Patient is currently intubated on ventilator support, currently on fentanyl, levophed MV: 10.1 L/min Temp (24hrs), Avg:99.2 F (37.3 C), Min:98.4 F (36.9 C), Max:99.9 F (37.7 C)  Nutrition-Focused physical exam completed. Findings are WDL for fat depletion, WDL for muscle depletion, and mild edema.   Diet Order:  Diet NPO time specified   Energy Intake: pt  eating 70-75% of meal trays on Diabetic/Heart Healthy Diet prior to intubation  Digestive System: OG tube in stomach, abdomen obese/BS hypoactive Skin:  Reviewed, no issues  Last BM:  02/28/16   Labs:  Glucose Profile:  Recent Labs  03/01/16 0449 03/01/16 0827 03/01/16 1138  GLUCAP 238* 204* 240*     Meds: lasix, glucotrol, ss novolog, levemir, glucophage, potassium chloride, prednisone  Height:   Ht Readings from Last 1 Encounters:  02/28/16 5\' 3"  (1.6 m)    Weight: family reports 20 pound wt loss in the last 3 months or so; 11.5% wt loss.   Wt Readings from Last 1 Encounters:  02/28/16 229 lb 8 oz (104.1 kg)    Ideal Body Weight:  52.3 kg  BMI:  Body mass index is 40.66 kg/(m^2).  Estimated Nutritional Needs:   Kcal:  [id number] kcals (11-14 kcals/kg current wt for BMI >30 using ASPEN guidelines)  Protein:  >/= 104 g (2.0 g/kg) IBW  Fluid:  >/= 1.5 L  EDUCATION NEEDS:   Education needs no appropriate at this time  [place], [place], [place] [phone] Pager  [phone] Weekend/On-Call Pager

## 2016-03-01 NOTE — Progress Notes (Signed)
Inpatient Diabetes Program Recommendations  AACE/ADA: New Consensus Statement on Inpatient Glycemic Control (2015)  Target Ranges:  Prepandial:   less than 140 mg/dL      Peak postprandial:   less than 180 mg/dL (1-2 hours)      Critically ill patients:  140 - 180 mg/dL  Results for MINSA, WIEMER (MRN [id number]) as of 03/01/2016 09:36  Ref. Range 02/29/2016 07:33 02/29/2016 11:16 02/29/2016 13:23 02/29/2016 21:39 03/01/2016 01:08 03/01/2016 04:49 03/01/2016 08:27  Glucose-Capillary Latest Ref Range: 65-99 mg/dL 188 (H) 246 (H) 213 (H) 308 (H) 261 (H) 238 (H) 204 (H)   Review of Glycemic Control  Current orders for Inpatient glycemic control: Levemir 85 units BID, Novolog 0-9 units Q4H, Metformin 500 mg BID, Glipizide 5 mg daily with lunch  Inpatient Diabetes Program Recommendations: Correction (SSI): Please consider increasing Novolog correction scale to resistant scale Q4H. If glucose continues to be over 200 mg/dl despite increase to resistant scale, please consider discontinuing regular glycemic control order set and use the ICU Glycemic Control order set to improve glycemic control.  Thanks, Barnie Alderman, RN, MSN, CDE Diabetes Coordinator Inpatient Diabetes Program [phone] (Team Pager from [place] to [place]) [phone] ([place] office) [phone] [facility] office) [phone] [facility] office)

## 2016-03-02 ENCOUNTER — Inpatient Hospital Stay: Payer: Medicare Other

## 2016-03-02 DIAGNOSIS — R509 Fever, unspecified: Secondary | ICD-10-CM

## 2016-03-02 DIAGNOSIS — I959 Hypotension, unspecified: Secondary | ICD-10-CM

## 2016-03-02 DIAGNOSIS — J189 Pneumonia, unspecified organism: Secondary | ICD-10-CM

## 2016-03-02 LAB — GLUCOSE, CAPILLARY
GLUCOSE-CAPILLARY: 151 mg/dL — AB (ref 65–99)
GLUCOSE-CAPILLARY: 246 mg/dL — AB (ref 65–99)
GLUCOSE-CAPILLARY: 257 mg/dL — AB (ref 65–99)
GLUCOSE-CAPILLARY: 290 mg/dL — AB (ref 65–99)
GLUCOSE-CAPILLARY: 297 mg/dL — AB (ref 65–99)
GLUCOSE-CAPILLARY: 302 mg/dL — AB (ref 65–99)
GLUCOSE-CAPILLARY: 363 mg/dL — AB (ref 65–99)
GLUCOSE-CAPILLARY: 371 mg/dL — AB (ref 65–99)
Glucose-Capillary: 195 mg/dL — ABNORMAL HIGH (ref 65–99)
Glucose-Capillary: 206 mg/dL — ABNORMAL HIGH (ref 65–99)
Glucose-Capillary: 257 mg/dL — ABNORMAL HIGH (ref 65–99)
Glucose-Capillary: 209 mg/dL — ABNORMAL HIGH (ref 65–99)
Glucose-Capillary: 160 mg/dL — ABNORMAL HIGH (ref 65–99)
Glucose-Capillary: 192 mg/dL — ABNORMAL HIGH (ref 65–99)

## 2016-03-02 LAB — EXPECTORATED SPUTUM ASSESSMENT W REFEX TO RESP CULTURE

## 2016-03-02 LAB — CBC WITH DIFFERENTIAL/PLATELET
BASOS PCT: 0 %
Basophils Absolute: 0 10*3/uL (ref 0–0.1)
Eosinophils Absolute: 0 10*3/uL (ref 0–0.7)
Eosinophils Relative: 0 %
HCT: 38.3 % (ref 35.0–47.0)
HEMOGLOBIN: 12.4 g/dL (ref 12.0–16.0)
LYMPHS ABS: 0.6 10*3/uL — AB (ref 1.0–3.6)
LYMPHS PCT: 4 %
MCH: 29.6 pg (ref 26.0–34.0)
MCHC: 32.3 g/dL (ref 32.0–36.0)
MCV: 91.4 fL (ref 80.0–100.0)
MONO ABS: 1.1 10*3/uL — AB (ref 0.2–0.9)
MONOS PCT: 7 %
NEUTROS ABS: 13 10*3/uL — AB (ref 1.4–6.5)
NEUTROS PCT: 89 %
Platelets: 186 10*3/uL (ref 150–440)
RBC: 4.19 MIL/uL (ref 3.80–5.20)
RDW: 13.2 % (ref 11.5–14.5)
WBC: 14.7 10*3/uL — ABNORMAL HIGH (ref 3.6–11.0)

## 2016-03-02 LAB — EXPECTORATED SPUTUM ASSESSMENT W GRAM STAIN, RFLX TO RESP C

## 2016-03-02 LAB — BASIC METABOLIC PANEL
Anion gap: 9 (ref 5–15)
BUN: 68 mg/dL — ABNORMAL HIGH (ref 6–20)
CALCIUM: 9 mg/dL (ref 8.9–10.3)
CHLORIDE: 99 mmol/L — AB (ref 101–111)
CO2: 31 mmol/L (ref 22–32)
CREATININE: 1.41 mg/dL — AB (ref 0.44–1.00)
GFR calc non Af Amer: 38 mL/min — ABNORMAL LOW (ref >60)
GFR, EST AFRICAN AMERICAN: 44 mL/min — AB (ref >60)
GLUCOSE: 281 mg/dL — AB (ref 65–99)
Potassium: 4.2 mmol/L (ref 3.5–5.1)
Sodium: 139 mmol/L (ref 135–145)

## 2016-03-02 MED ORDER — SODIUM CHLORIDE 0.9 % IV SOLN
INTRAVENOUS | Status: DC
  Administered 2016-03-02: 2.4 [IU]/h via INTRAVENOUS
  Administered 2016-03-03: 16:00:00 via INTRAVENOUS
  Administered 2016-03-04: 6.2 [IU]/h via INTRAVENOUS
  Administered 2016-03-04: 8.8 [IU]/h via INTRAVENOUS
  Administered 2016-03-04: 10.9 [IU]/h via INTRAVENOUS
  Administered 2016-03-05: 4.7 [IU]/h via INTRAVENOUS
  Administered 2016-03-06: 5 [IU]/h via INTRAVENOUS
  Administered 2016-03-08: 2.1 [IU]/h via INTRAVENOUS
  Filled 2016-03-02 (×6): qty 2.5

## 2016-03-02 MED ORDER — ENOXAPARIN SODIUM 40 MG/0.4ML ~~LOC~~ SOLN
40.0000 mg | SUBCUTANEOUS | Status: DC
  Administered 2016-03-03 – 2016-03-07 (×5): 40 mg via SUBCUTANEOUS
  Filled 2016-03-02 (×5): qty 0.4

## 2016-03-02 MED ORDER — PIPERACILLIN-TAZOBACTAM 3.375 G IVPB
3.3750 g | Freq: Three times a day (TID) | INTRAVENOUS | Status: DC
  Administered 2016-03-02 – 2016-03-03 (×3): 3.375 g via INTRAVENOUS
  Filled 2016-03-02 (×5): qty 50

## 2016-03-02 MED ORDER — POTASSIUM CHLORIDE 20 MEQ/15ML (10%) PO SOLN
40.0000 meq | Freq: Every day | ORAL | Status: DC
  Administered 2016-03-02 – 2016-03-11 (×10): 40 meq via ORAL
  Filled 2016-03-02 (×10): qty 30

## 2016-03-02 MED ORDER — BUPROPION HCL 75 MG PO TABS
75.0000 mg | ORAL_TABLET | Freq: Two times a day (BID) | ORAL | Status: DC
  Administered 2016-03-02 – 2016-03-05 (×7): 75 mg via ORAL
  Filled 2016-03-02 (×9): qty 1

## 2016-03-02 MED ORDER — INSULIN ASPART 100 UNIT/ML ~~LOC~~ SOLN: 2.0000 [IU] | SUBCUTANEOUS | Status: DC

## 2016-03-02 NOTE — Progress Notes (Signed)
Patient resting lightly sedated on vent. Following simple commands to open mouth and raise arms.  Patient remains on levophed attempts to titrate to off unsuccessful, patient currently at 93mcg maintaining VS WDL. Urine output remains marginal see CHL for further assessment.

## 2016-03-02 NOTE — Progress Notes (Signed)
PULMONARY / CRITICAL CARE MEDICINE   Name: Ebony [name] MRN: [id number] DOB: 09/03/1946    ADMISSION DATE:  02/27/2016 Consulting Physician - Dr. Margaretmary Eddy   BRIEF HISTORY: Kalt Amar is a 70 year old female with medical history of HTN, DM,chronic respiratory failure due to COPD,sleep apnea, Hyperlipidemia, hypothyroidism, depression, morbid obesity who presented to [facility] on 4/16 with increased shortness of breath . She was placed on BiPAP and transitioned to HFNC, but the patient continues to complaint of increased shortness of breath, tightness, patient had a hard time to speak in full sentences. Patient was transferred to ICU for further and PCCM team was consulted for further management. Patient denies any chest pain, nausea, vomiting, dizziness , diaphoresis. Patient was found to be lethargic and had increased work of breathing therefore was intubated on 02/28/18  SUBJECTIVE:  Patient remains orally intubated and on norepi and fentanyl drip.  Patient is able to open eyes to voice and nods appropriately to questions. Patient was febrile overnight She also noted to have low urine output during the night time. Patient was tried on pressure support ventilation but did not tolerate it very well.   SIGNIFICANT EVENTS: 4/17>>AECOPD, Bronchitis, requiring continuous Bipap 4/18>>intermitted bipap, bridge with HFNC  VITAL SIGNS: Temp:  [97.5 F (36.4 C)-102.9 F (39.4 C)] 101.1 F (38.4 C) (04/20 1300) Pulse Rate:  [68-100] 71 (04/20 1300) Resp:  [18-28] 24 (04/20 1300) BP: (87-139)/(41-67) 128/51 mmHg (04/20 1300) SpO2:  [90 %-99 %] 97 % (04/20 1302) FiO2 (%):  [40 %-45 %] 40 % (04/20 1302) Weight:  [216 lb 4.3 oz (98.1 kg)] 216 lb 4.3 oz (98.1 kg) (04/20 0417) HEMODYNAMICS:   VENTILATOR SETTINGS: Vent Mode:  [-] PRVC FiO2 (%):  [40 %-45 %] 40 % Set Rate:  [24 bmp] 24 bmp Vt Set:  [450 mL] 450 mL PEEP:  [5 cmH20] 5 cmH20 INTAKE / OUTPUT:  Intake/Output Summary (Last 24 hours)  at 03/02/16 1413 Last data filed at 03/02/16 1340  Gross per 24 hour  Intake 3796.29 ml  Output   1450 ml  Net 2346.29 ml    Review of Systems  Constitutional: Negative for fever, chills, malaise/fatigue and diaphoresis.  HENT: Negative for sore throat.   Eyes: Negative for blurred vision.  Respiratory: Positive for cough, shortness of breath and wheezing. Negative for sputum production and stridor.   Cardiovascular: Negative for chest pain and palpitations.  Gastrointestinal: Negative for nausea.  Musculoskeletal: Negative for myalgias.  Neurological: Negative for dizziness.  Psychiatric/Behavioral: Negative for depression.    Physical Exam General: Sickly appearing white female Neuro: Opens eyes to voice, lethargic, , nods appropriately to yes or no questions HEENT: Atraumatic, normocephalic,no discharge Cardiovascular: S1S2, RRR. [place] Lungs: Diminished bilaterally, symettrical chest expansion,no wheezes, crackles, rhonchi noted Abdomen: obese, soft, non tender, good bowel sounds Musculoskeletal: good muscle tone, no inflamation, deformity noted. Skin: Grossly intact  LABS:  CBC  Recent Labs Lab 02/28/16 0548 03/01/16 0537 03/02/16 1247  WBC 8.3 11.8* 14.7*  HGB 14.0 13.2 12.4  HCT 42.3 40.6 38.3  PLT 242 252 186   Coag's No results for input(s): APTT, INR in the last 168 hours. BMET  Recent Labs Lab 02/29/16 2107 03/01/16 0537 03/02/16 1247  NA 140 141 139  K 5.9* 4.6 4.2  CL 97* 98* 99*  CO2 35* 36* 31  BUN 37* 51* 68*  CREATININE 1.30* 1.82* 1.41*  GLUCOSE 306* 242* 281*   Electrolytes  Recent Labs Lab 02/29/16 1510 02/29/16 2107 03/01/16  [id number] 03/02/16 1247  CALCIUM  --  9.6 9.6 9.0  MG 2.0  --  2.1  --   PHOS 4.8*  --  4.0  --    Sepsis Markers No results for input(s): LATICACIDVEN, PROCALCITON, O2SATVEN in the last 168 hours. ABG  Recent Labs Lab 02/29/16 1842 03/01/16 0900 03/01/16 1410  PHART 7.25* 7.27* 7.34*  PCO2ART  87* 77* 66*  PO2ART 179* 58* 101   Liver Enzymes  Recent Labs Lab 02/27/16 0718  AST 64*  ALT 63*  ALKPHOS 37*  BILITOT 0.6  ALBUMIN 4.0   Cardiac Enzymes  Recent Labs Lab 02/27/16 0718  TROPONINI <0.03   Glucose  Recent Labs Lab 03/01/16 1605 03/01/16 2006 03/01/16 2353 03/02/16 0410 03/02/16 0723 03/02/16 1214  GLUCAP 329* 246* 187* 195* 206* 257*    Imaging No results found.  STUDIES:  4/4/ 17 >> CT chest was Negative for acute pulmonary embolism.Mild nodularity in the lung bases, with resolution of nodularopacities observed in November and development of a new 6 mm rightlower lobe nodule. This is most likely inflammatory given theinterval changes.  CULTURES: none  ANTIBIOTICS: 4/16 levaquin>>  4/20 Zosyn  SIGNIFICANT EVENTS: 4/17> Transferred to ICU  LINES/TUBES: NONE  DISCUSSION: 70 year old female with known history of chronic respiratory failure due to COPD, Sleep apnea, hyperlipidemia, now with admitted with AECOPD/Bronchitis  ASSESSMENT / PLAN:  PULMONARY A: Acute on chronic respiratory failure COPD exacerbation with acute bronchiolitis Right lower lobe lung nodule (benign and old) P:  Continue levaquin for 10 days total) day #6 Added zosyn   SBT trials in am as tolerated by the patient with goals of extubation Continue pulmicort/xopnex/ atrovent Continue steroid taper over 14 days    CARDIOVASCULAR A:  Shock Hypertension, Sinus Tachycardia Hyperlipidemia P:  Diltiazem PRN Continue Simvastatin Hold hyzaar   RENAL A:  Acute kidne injury P:  Monitor electrolytes Replace lytes per protocol N/S@100ml /hour 542ml bolus  GASTROINTESTINAL A:  No active issues P:  NPO  Started on trickle tube feeds Protonix for GIP HEMATOLOGIC A:  No active issues P:  lovenox for DVT prophylaxis SCD'S  INFECTIOUS A:   P:  levaquin for 7days, day#5  ENDOCRINE A:  Diabetese  Melitus Hypothyroidism P:  Stop oral hypoglycemics If glucose continues to be over 200 mg will switch to ICU glycemic control as per diabetes co-ordinator. Continue synthroid  NEUROLOGIC A:  No active issues Depression P:  RASS goal:0 -1 Management per primary team   Bincy Varughese,AG-ACNP Pulmonary & Critical Care

## 2016-03-02 NOTE — Progress Notes (Signed)
Nutrition Follow-up  DOCUMENTATION CODES:   Morbid obesity  INTERVENTION:  EN: recommend continuing current TF regimen; continue to assess   NUTRITION DIAGNOSIS:   Inadequate oral intake related to acute illness as evidenced by NPO status.  GOAL:   Provide needs based on ASPEN/SCCM guidelines  MONITOR:   Vent status, TF tolerance, Labs, Weight trends, I & O's  REASON FOR ASSESSMENT:   Consult, Ventilator Enteral/tube feeding initiation and management  ASSESSMENT:    70 yo female admitted with acute respiratory failure duw to acute on chronic COPD exacerbation with associated acute bronchitis, initially on Bipap, required intubation on 02/29/16   Patient is currently intubated on ventilator support, levophed at 7 mcg/min, fentanyl for sedation  Temp (24hrs), Avg:99.1 F (37.3 C), Min:97.5 F (36.4 C), Max:102.7 F (39.3 C)  Pt tolerating Vital High Protein at rate of 60 ml/hr  Diet Order:  Diet NPO time specified  Skin:  Reviewed, no issues  Last BM:  02/28/16   Labs:  Glucose Profile:  Recent Labs  03/01/16 2353 03/02/16 0410 03/02/16 0723  GLUCAP 187* 195* 206*   Meds: NS at 75 ml/hr, lasix, ss novolog, levemir, levophed  Height:   Ht Readings from Last 1 Encounters:  02/28/16 5\' 3"  (1.6 m)    Weight:   Wt Readings from Last 1 Encounters:  03/02/16 216 lb 4.3 oz (98.1 kg)    Ideal Body Weight:  52.3 kg  BMI:  Body mass index is 38.32 kg/(m^2).  Estimated Nutritional Needs:   Kcal:  [id number] kcals (11-14 kcals/kg current wt for BMI >30 using ASPEN guidelines)  Protein:  >/= 104 g (2.0 g/kg) IBW  Fluid:  >/= 1.5 L  EDUCATION NEEDS:   Education needs no appropriate at this time  [place], [place], [place] [phone] Pager  [phone] Weekend/On-Call Pager

## 2016-03-02 NOTE — Progress Notes (Signed)
Pharmacy Antibiotic Note  Ebony [name] is a 70 y.o. female admitted on 02/27/2016 with AECOPD/bronchitis.  Pharmacy has been consulted for Levaquin dosing.  Plan: Patient spiking fevers so will escalate antibiotics and change to Zosyn per Dr. Stevenson Clinch.  Will begin Zosyn 3.375 g EI q 8 hours EI.     Height: 5\' 3"  (160 cm) Weight: 216 lb 4.3 oz (98.1 kg) IBW/kg (Calculated) : 52.4  Temp (24hrs), Avg:100.8 F (38.2 C), Min:97.5 F (36.4 C), Max:102.9 F (39.4 C)   Recent Labs Lab 02/27/16 0718 02/28/16 0548 02/29/16 2107 03/01/16 0537 03/02/16 1247  WBC 9.4 8.3  --  11.8* 14.7*  CREATININE 0.81 0.60 1.30* 1.82* 1.41*    Estimated Creatinine Clearance: 43.2 mL/min (by C-G formula based on Cr of 1.41).    Allergies  Allergen Reactions  . Other Hives and Rash    Shellfish. Shellfish Pt reports no allergic reaction to iodine or betadine.  . Shellfish Allergy Hives and Rash    Antimicrobials this admission: Levaquin 4/16 >> 4/20 Zosyn 4/20 >>  Dose adjustments this admission: Levaquin decreased to 250 mg daily 4/19  Microbiology results: 4/17 MRSA PCR: negative  Thank you for allowing pharmacy to be a part of this patient's care.  Napoleon Form 03/02/2016 3:19 PM

## 2016-03-02 NOTE — Progress Notes (Signed)
70 y/o F on Lovenox 40 mg bid for DVT prophylaxis.   CrCl: 43.2 ml/min, BMI: 38.4  Patient with BMI now < 40 and weight < 100 kg so will decrease Lovenox to 40 mg daily.   Ulice Dash, PharmD Clinical Pharmacist

## 2016-03-02 NOTE — Progress Notes (Signed)
Inpatient Diabetes Program Recommendations  AACE/ADA: New Consensus Statement on Inpatient Glycemic Control (2015)  Target Ranges:  Prepandial:   less than 140 mg/dL      Peak postprandial:   less than 180 mg/dL (1-2 hours)      Critically ill patients:  140 - 180 mg/dL   Review of Glycemic Control  Results for NELL, [place] (MRN [id number]) as of 03/02/2016 13:44  Ref. Range 03/01/2016 08:27 03/01/2016 11:38 03/01/2016 16:05 03/01/2016 20:06 03/01/2016 23:53 03/02/2016 04:10 03/02/2016 07:23 03/02/2016 12:14  Glucose-Capillary Latest Ref Range: 65-99 mg/dL 204 (H) 240 (H) 329 (H) 246 (H) 187 (H) 195 (H) 206 (H) 257 (H)  Diabetes history: Type 2  Current orders for Inpatient glycemic control: Levemir 85 units BID, Novolog 0-9 units Q4H, Metformin 1000 mg BID, Glipizide 5 mg daily with lunch, Invokana 100mg /day, Humalog 38-48 units tid with meals   Current orders for Inpatient glycemic control: IV insulin/glucostabilizer orders  Inpatient Diabetes Program Recommendations:   Inpatient Diabetes Program Recommendations: PER PROTOCOL- Discontinue IV insulin drip when.... 1. the insulin infusion rate is < 4 units / hour 2. Tube feeds are at goal and stable 3. there are 6 subsequent CBG readings < 180 mg/dL  **Please follow the guidelines in the ICU Glycemic Control Protocol Phase III to transition off drip- Per protocol- Lantus insulin MUST be given 2 hours prior to insulin drip being d/c.   Monitor CBG every 4 hours Administer correction insulin (NOVOLOG) Abbott according to sliding scale when the drip is stopped Administer tube feeding coverage (NOVOLOG) as ordered [place] every 4 hours after tubes feeds are at goal. If patient is not receiving TF, it is not at goal, or coverage is not ordered - skip this step.   Gentry Fitz, RN, BA, MHA, CDE Diabetes Coordinator Inpatient Diabetes Program  [phone] (Team Pager) [phone] ([place]) 03/02/2016 1:56 PM

## 2016-03-03 DIAGNOSIS — R6521 Severe sepsis with septic shock: Secondary | ICD-10-CM

## 2016-03-03 DIAGNOSIS — A419 Sepsis, unspecified organism: Secondary | ICD-10-CM

## 2016-03-03 LAB — GLUCOSE, CAPILLARY
GLUCOSE-CAPILLARY: 198 mg/dL — AB (ref 65–99)
GLUCOSE-CAPILLARY: 155 mg/dL — AB (ref 65–99)
GLUCOSE-CAPILLARY: 206 mg/dL — AB (ref 65–99)
GLUCOSE-CAPILLARY: 167 mg/dL — AB (ref 65–99)
GLUCOSE-CAPILLARY: 176 mg/dL — AB (ref 65–99)
GLUCOSE-CAPILLARY: 141 mg/dL — AB (ref 65–99)
GLUCOSE-CAPILLARY: 209 mg/dL — AB (ref 65–99)
GLUCOSE-CAPILLARY: 163 mg/dL — AB (ref 65–99)
GLUCOSE-CAPILLARY: 125 mg/dL — AB (ref 65–99)
GLUCOSE-CAPILLARY: 177 mg/dL — AB (ref 65–99)
GLUCOSE-CAPILLARY: 194 mg/dL — AB (ref 65–99)
GLUCOSE-CAPILLARY: 191 mg/dL — AB (ref 65–99)
GLUCOSE-CAPILLARY: 136 mg/dL — AB (ref 65–99)
GLUCOSE-CAPILLARY: 171 mg/dL — AB (ref 65–99)
GLUCOSE-CAPILLARY: 148 mg/dL — AB (ref 65–99)
Glucose-Capillary: 151 mg/dL — ABNORMAL HIGH (ref 65–99)
Glucose-Capillary: 185 mg/dL — ABNORMAL HIGH (ref 65–99)
Glucose-Capillary: 188 mg/dL — ABNORMAL HIGH (ref 65–99)
Glucose-Capillary: 166 mg/dL — ABNORMAL HIGH (ref 65–99)
Glucose-Capillary: 189 mg/dL — ABNORMAL HIGH (ref 65–99)
Glucose-Capillary: 167 mg/dL — ABNORMAL HIGH (ref 65–99)
Glucose-Capillary: 161 mg/dL — ABNORMAL HIGH (ref 65–99)
Glucose-Capillary: 129 mg/dL — ABNORMAL HIGH (ref 65–99)

## 2016-03-03 LAB — BASIC METABOLIC PANEL
ANION GAP: 3 — AB (ref 5–15)
BUN: 45 mg/dL — AB (ref 6–20)
CALCIUM: 9.1 mg/dL (ref 8.9–10.3)
CO2: 35 mmol/L — ABNORMAL HIGH (ref 22–32)
Chloride: 103 mmol/L (ref 101–111)
Creatinine, Ser: 0.89 mg/dL (ref 0.44–1.00)
GFR calc Af Amer: >60 mL/min (ref >60)
GLUCOSE: 147 mg/dL — AB (ref 65–99)
Potassium: 3.8 mmol/L (ref 3.5–5.1)
SODIUM: 141 mmol/L (ref 135–145)

## 2016-03-03 LAB — CBC
HCT: 36.9 % (ref 35.0–47.0)
Hemoglobin: 12.1 g/dL (ref 12.0–16.0)
MCH: 29.9 pg (ref 26.0–34.0)
MCHC: 32.9 g/dL (ref 32.0–36.0)
MCV: 90.9 fL (ref 80.0–100.0)
Platelets: 174 10*3/uL (ref 150–440)
RBC: 4.06 MIL/uL (ref 3.80–5.20)
RDW: 13.5 % (ref 11.5–14.5)
WBC: 14.4 10*3/uL — AB (ref 3.6–11.0)

## 2016-03-03 LAB — PROCALCITONIN

## 2016-03-03 MED ORDER — LEVALBUTEROL HCL 0.63 MG/3ML IN NEBU
0.6300 mg | INHALATION_SOLUTION | Freq: Four times a day (QID) | RESPIRATORY_TRACT | Status: AC
  Administered 2016-03-03 – 2016-03-05 (×8): 0.63 mg via RESPIRATORY_TRACT
  Filled 2016-03-03 (×8): qty 3

## 2016-03-03 MED ORDER — IPRATROPIUM BROMIDE 0.02 % IN SOLN
0.5000 mg | Freq: Four times a day (QID) | RESPIRATORY_TRACT | Status: AC
  Administered 2016-03-03 – 2016-03-05 (×8): 0.5 mg via RESPIRATORY_TRACT
  Filled 2016-03-03 (×8): qty 2.5

## 2016-03-03 MED ORDER — VANCOMYCIN HCL 10 G IV SOLR
1500.0000 mg | INTRAVENOUS | Status: DC
  Administered 2016-03-03 – 2016-03-06 (×4): 1500 mg via INTRAVENOUS
  Filled 2016-03-03 (×4): qty 1500

## 2016-03-03 MED ORDER — VANCOMYCIN HCL 10 G IV SOLR
1500.0000 mg | INTRAVENOUS | Status: AC
  Administered 2016-03-03: 1500 mg via INTRAVENOUS
  Filled 2016-03-03: qty 1500

## 2016-03-03 MED ORDER — SODIUM CHLORIDE 0.9 % IV SOLN
1.0000 g | Freq: Three times a day (TID) | INTRAVENOUS | Status: DC
  Administered 2016-03-03 – 2016-03-14 (×33): 1 g via INTRAVENOUS
  Filled 2016-03-03 (×37): qty 1

## 2016-03-03 NOTE — Progress Notes (Addendum)
Pharmacy Antibiotic Note  Ebony [name] is a 70 y.o. female admitted on 02/27/2016 with respiratory failure .  Pharmacy has been consulted for Merrem and Vancomycin  dosing.  Plan: After discussion with MD Mungal on rounds, will broaden Zosyn to Vancomycin and Merrem since patient is not doing well.   Will give Vancomycin 1500 mg IV x 1  And will then start Vancomycin 1500 mg IV q18 hours. Trough level ordered to be drawn @ 02:00 on 04/27.   Height: 5\' 3"  (160 cm) Weight: 212 lb 15.4 oz (96.6 kg) IBW/kg (Calculated) : 52.4  Temp (24hrs), Avg:101.3 F (38.5 C), Min:100 F (37.8 C), Max:101.8 F (38.8 C)   Recent Labs Lab 02/27/16 0718 02/28/16 0548 02/29/16 2107 03/01/16 0537 03/02/16 1247 03/03/16 0959  WBC 9.4 8.3  --  11.8* 14.7* 14.4*  CREATININE 0.81 0.60 1.30* 1.82* 1.41* 0.89    Estimated Creatinine Clearance: 67.9 mL/min (by C-G formula based on Cr of 0.89).    Allergies  Allergen Reactions  . Other Hives and Rash    Shellfish. Shellfish Pt reports no allergic reaction to iodine or betadine.  . Shellfish Allergy Hives and Rash    Antimicrobials this admission: Zosyn  4/20 >> 4/21 Merrem  4/21 >>  Vancomycin 4/21  Dose adjustments this admission:   Microbiology results:  BCx:   UCx:    Sputum:    MRSA PCR:   Thank you for allowing pharmacy to be a part of this patient's care.  Ollis Daudelin D 03/03/2016 12:19 PM

## 2016-03-03 NOTE — Progress Notes (Signed)
Pharmacy Antibiotic Note  Ebony [name] is a 70 y.o. female admitted on 02/27/2016 with AECOPD/bronchitis.  Pharmacy has been consulted for Zosyn dosing.  Plan: Continue Zosyn 3.375 g IV q8 hours.     Height: 5\' 3"  (160 cm) Weight: 212 lb 15.4 oz (96.6 kg) IBW/kg (Calculated) : 52.4  Temp (24hrs), Avg:101.4 F (38.6 C), Min:101.1 F (38.4 C), Max:102.4 F (39.1 C)   Recent Labs Lab 02/27/16 0718 02/28/16 0548 02/29/16 2107 03/01/16 0537 03/02/16 1247  WBC 9.4 8.3  --  11.8* 14.7*  CREATININE 0.81 0.60 1.30* 1.82* 1.41*    Estimated Creatinine Clearance: 42.8 mL/min (by C-G formula based on Cr of 1.41).    Allergies  Allergen Reactions  . Other Hives and Rash    Shellfish. Shellfish Pt reports no allergic reaction to iodine or betadine.  . Shellfish Allergy Hives and Rash    Antimicrobials this admission: Levaquin 4/16 >> 4/20 Zosyn 4/20 >>  Dose adjustments this admission: Levaquin decreased to 250 mg daily 4/19  Microbiology results: 4/17 MRSA PCR: negative  Thank you for allowing pharmacy to be a part of this patient's care.  Kaine Mcquillen D 03/03/2016 9:49 AM

## 2016-03-03 NOTE — Progress Notes (Signed)
Chaplain rounded the unit and provided a compassionate presence and spiritual support to the patient.  Chaplain Luisdaniel Kenton [phone] 

## 2016-03-03 NOTE — Progress Notes (Signed)
Nutrition Follow-up  DOCUMENTATION CODES:   Morbid obesity  INTERVENTION:   -Recommend continuing current TF regimen at present; continue to assess -Last BM 4 days ago, recommend addition of bowel regimen   NUTRITION DIAGNOSIS:   Inadequate oral intake related to acute illness as evidenced by NPO status.  Being addressed via TF  GOAL:   Provide needs based on ASPEN/SCCM guidelines  MONITOR:   Vent status, TF tolerance, Labs, Weight trends, I & O's  REASON FOR ASSESSMENT:   Consult, Ventilator Enteral/tube feeding initiation and management  ASSESSMENT:    70 yo female admitted with acute respiratory failure duw to acute on chronic COPD exacerbation with associated acute bronchitis, initially on Bipap, required intubation on 02/29/16  Pt remains on vent support, failed vent wean this AM, febrile overnight  Diet Order:  Diet NPO time specified   EN: tolerating Vital High Protein at rate of 60 ml/hr  Skin:  Reviewed, no issues  Last BM:  02/28/16   Labs: reviewed  Meds: NS at 100 ml/hr, insulin drip, prednisone  Height:   Ht Readings from Last 1 Encounters:  03/02/16 5\' 3"  (1.6 m)    Weight:   Wt Readings from Last 1 Encounters:  03/03/16 212 lb 15.4 oz (96.6 kg)    Ideal Body Weight:  52.3 kg  BMI:  Body mass index is 37.73 kg/(m^2).  Estimated Nutritional Needs:   Kcal:  [id number] kcals (11-14 kcals/kg current wt for BMI >30 using ASPEN guidelines)  Protein:  >/= 104 g (2.0 g/kg) IBW  Fluid:  >/= 1.5 L  EDUCATION NEEDS:   Education needs no appropriate at this time  [place], [place], [place] [phone] Pager  [phone] Weekend/On-Call Pager

## 2016-03-03 NOTE — Progress Notes (Addendum)
PULMONARY / CRITICAL CARE MEDICINE   Name: Ebony [name] MRN: [id number] DOB: 07-30-1946    ADMISSION DATE:  02/27/2016 Consulting Physician - Dr. Margaretmary Eddy   BRIEF HISTORY: Ebony [name] is a 70 year old female with medical history of HTN, DM,chronic respiratory failure due to COPD,sleep apnea, Hyperlipidemia, hypothyroidism, depression, morbid obesity who presented to [facility] on 4/16 with increased shortness of breath . She was placed on BiPAP and transitioned to HFNC, but the patient continues to complaint of increased shortness of breath, tightness, patient had a hard time to speak in full sentences. Patient was transferred to ICU for further and PCCM team was consulted for further management. Patient denies any chest pain, nausea, vomiting, dizziness , diaphoresis. Patient was found to be lethargic and had increased work of breathing therefore was intubated on 02/28/18  SUBJECTIVE:  Patient remains orally intubated and on norepi and fentanyl drip.  Patient is able to open eyes to voice and nods appropriately to questions. Patient was febrile overnight She also noted to have low urine output during the night time. Patient was tried on pressure support ventilation but did not tolerate it very well.   SIGNIFICANT EVENTS: 4/17>>AECOPD, Bronchitis, requiring continuous Bipap 4/18>>intermitted bipap, bridge with HFNC  VITAL SIGNS: Temp:  [100 F (37.8 C)-101.8 F (38.8 C)] 100 F (37.8 C) (04/21 1100) Pulse Rate:  [57-96] 96 (04/21 1100) Resp:  [22-24] 22 (04/21 1100) BP: (92-177)/(36-61) 120/60 mmHg (04/21 1100) SpO2:  [95 %-99 %] 95 % (04/21 1100) FiO2 (%):  [35 %-40 %] 35 % (04/21 1100) Weight:  [212 lb 15.4 oz (96.6 kg)] 212 lb 15.4 oz (96.6 kg) (04/21 0545) HEMODYNAMICS:   VENTILATOR SETTINGS: Vent Mode:  [-] PRVC FiO2 (%):  [35 %-40 %] 35 % Set Rate:  [24 bmp] 24 bmp Vt Set:  [450 mL] 450 mL PEEP:  [5 cmH20] 5 cmH20 Plateau Pressure:  [28 cmH20] 28 cmH20 INTAKE /  OUTPUT:  Intake/Output Summary (Last 24 hours) at 03/03/16 1245 Last data filed at 03/03/16 1000  Gross per 24 hour  Intake 4991.23 ml  Output   3025 ml  Net 1966.23 ml    Review of Systems  Constitutional: Negative for fever, chills, malaise/fatigue and diaphoresis.  HENT: Negative for sore throat.   Eyes: Negative for blurred vision.  Respiratory: Positive for shortness of breath. Negative for sputum production and stridor.   Cardiovascular: Negative for chest pain and palpitations.  Gastrointestinal: Negative for nausea.  Musculoskeletal: Negative for myalgias.  Neurological: Negative for dizziness.  Psychiatric/Behavioral: Negative for depression.    Physical Exam General: Sickly appearing white female Neuro: Opens eyes to voice, lethargic, , nods appropriately to yes or no questions HEENT: Atraumatic, normocephalic,no discharge Cardiovascular: S1S2, RRR. [place] Lungs: Diminished bilaterally, symettrical chest expansion,no wheezes, crackles, rhonchi noted Abdomen: obese, soft, non tender, good bowel sounds Musculoskeletal: good muscle tone, no inflamation, deformity noted. Skin: Grossly intact  LABS:  CBC  Recent Labs Lab 03/01/16 0537 03/02/16 1247 03/03/16 0959  WBC 11.8* 14.7* 14.4*  HGB 13.2 12.4 12.1  HCT 40.6 38.3 36.9  PLT 252 186 174   Coag's No results for input(s): APTT, INR in the last 168 hours. BMET  Recent Labs Lab 03/01/16 0537 03/02/16 1247 03/03/16 0959  NA 141 139 141  K 4.6 4.2 3.8  CL 98* 99* 103  CO2 36* 31 35*  BUN 51* 68* 45*  CREATININE 1.82* 1.41* 0.89  GLUCOSE 242* 281* 147*   Electrolytes  Recent Labs Lab 02/29/16  1510  03/01/16 0537 03/02/16 1247 03/03/16 0959  CALCIUM  --   < > 9.6 9.0 9.1  MG 2.0  --  2.1  --   --   PHOS 4.8*  --  4.0  --   --   < > = values in this interval not displayed. Sepsis Markers  Recent Labs Lab 03/03/16 0959  PROCALCITON <0.10   ABG  Recent Labs Lab 02/29/16 1842  03/01/16 0900 03/01/16 1410  PHART 7.25* 7.27* 7.34*  PCO2ART 87* 77* 66*  PO2ART 179* 58* 101   Liver Enzymes  Recent Labs Lab 02/27/16 0718  AST 64*  ALT 63*  ALKPHOS 37*  BILITOT 0.6  ALBUMIN 4.0   Cardiac Enzymes  Recent Labs Lab 02/27/16 0718  TROPONINI <0.03   Glucose  Recent Labs Lab 03/03/16 0700 03/03/16 0808 03/03/16 0858 03/03/16 0951 03/03/16 1106 03/03/16 1155  GLUCAP 141* 209* 163* 125* 185* 188*    Imaging Dg Chest Port 1 View  03/02/2016  CLINICAL DATA:  Acute respiratory failure.  Subsequent encounter. EXAM: PORTABLE CHEST 1 VIEW COMPARISON:  02/29/2016 FINDINGS: Cardiac silhouette is normal in size. Normal mediastinal and hilar contours. Lungs are hyperexpanded. Small stable granuloma noted in the right mid lung. Lungs otherwise clear. No pleural effusion. No pneumothorax. Endotracheal tube is stable in well positioned. Nasogastric tube passes below the diaphragm into the proximal to mid stomach. IMPRESSION: 1. No acute cardiopulmonary disease. 2. Support apparatus is well positioned. Electronically Signed   By: Lajean Manes M.D.   On: 03/02/2016 15:05    STUDIES:  4/4/ 17 >> CT chest was Negative for acute pulmonary embolism.Mild nodularity in the lung bases, with resolution of nodularopacities observed in November and development of a new 6 mm rightlower lobe nodule. This is most likely inflammatory given theinterval changes.  CULTURES: Blood 4/20>> Sputum 4/20>> Urine 4/20>>  ANTIBIOTICS: 4/16 levaquin>>4/20  4/20 Zosyn >>4/21 Meropenem 4/21>> Vanc 4/21>>  SIGNIFICANT EVENTS: 4/17> Transferred to ICU  LINES/TUBES: NONE  DISCUSSION: 70 year old female with known history of chronic respiratory failure due to COPD, Sleep apnea, hyperlipidemia, now with admitted with AECOPD/Bronchitis, now with persistent fevers overnight   ASSESSMENT / PLAN:  PULMONARY A: Acute on chronic respiratory failure COPD exacerbation with acute  bronchiolitis Right lower lobe lung nodule (benign and old) Persistent fevers High Peak pressure on the Vent P:  Added zosyn   SBT trials in am as tolerated by the patient with goals of extubation Continue pulmicort/xopnex/ atrovent Continue steroid taper over 14 days Increase Abx coverage to meropenem and vanc due to persistent fevers Elevated vent peak pressure - schedule BD, increase sedation, may need to consider intermittent use of NMB.   CARDIOVASCULAR A:  Shock Hypertension, Sinus Tachycardia Hyperlipidemia P:  Diltiazem PRN Continue Simvastatin Hold hyzaar   RENAL A:  Acute kidne injury - imrpoving P:  Monitor electrolytes Replace lytes per protocol N/S@100ml /hour Better UOP today   GASTROINTESTINAL A:  No active issues P:  NPO  Started on trickle tube feeds Protonix for GIP HEMATOLOGIC A:  No active issues P:  lovenox for DVT prophylaxis SCD'S  INFECTIOUS A:Sepsis  Fever P:  - f\u cultures as stated above - abx changed on 4/21  Due to persistent fevers over the past 24 hours - may need to consider CT chest and exterimity U/S if fever continue without source.  - PCT>> ENDOCRINE A:  Diabetese Melitus Hypothyroidism P:  Stop oral hypoglycemics ICU hypo/hyperglycemic protocol Continue synthroid  NEUROLOGIC A:  No active issues P:  RASS goal:0 -1    I have personally obtained a history, examined the patient, evaluated laboratory and imaging results, formulated the assessment and plan and placed orders. CRITICAL CARE: The patient is critically ill with multiple organ systems failure and requires high complexity decision making for assessment and support, frequent evaluation and titration of therapies, application of advanced monitoring technologies and extensive interpretation of multiple databases. Critical Care Time devoted to patient care services described in this note is 35 minutes.    Vilinda Boehringer,  MD [place] [facility] Pager [phone] (please enter 7-digits) On Call Pager - [phone] (please enter 7-digits)

## 2016-03-03 NOTE — Progress Notes (Signed)
Inpatient Diabetes Program Recommendations  AACE/ADA: New Consensus Statement on Inpatient Glycemic Control (2015)  Target Ranges:  Prepandial:   less than 140 mg/dL      Peak postprandial:   less than 180 mg/dL (1-2 hours)      Critically ill patients:  140 - 180 mg/dL   Review of Glycemic Control   Results for Ebony [name], Ebony [name] (MRN [id number]) as of 03/03/2016 07:38  Ref. Range 03/03/2016 04:14 03/03/2016 05:18 03/03/2016 05:38 03/03/2016 06:08 03/03/2016 07:00  Glucose-Capillary Latest Ref Range: 65-99 mg/dL 206 (H) 167 (H) 176 (H) 151 (H) 141 (H)    Current orders for Inpatient glycemic control: IV insulin/glucostabilizer orders currently @ 8 units per hour- Please d/c other insulin orders outside this orderset  Inpatient Diabetes Program Recommendations: Inpatient Diabetes Program Recommendations: PER PROTOCOL- Discontinue IV insulin drip when.... 1. the insulin infusion rate is < 4 units / hour 2. Tube feeds are at goal and stable 3. there are 6 subsequent CBG readings < 180 mg/dL  **Please follow the guidelines in the ICU Glycemic Control Protocol Phase III to transition off drip- Per protocol- Lantus insulin MUST be given 2 hours prior to insulin drip being d/c.   Monitor CBG every 4 hours Administer correction insulin (NOVOLOG) [place] according to sliding scale when the drip is stopped Administer tube feeding coverage (NOVOLOG) as ordered [place] every 4 hours after tubes feeds are at goal. If patient is not receiving TF, it is not at goal, or coverage is not ordered - skip this step.   Gentry Fitz, RN, BA, MHA, CDE Diabetes Coordinator Inpatient Diabetes Program  [phone] (Team Pager) [phone] ([place]) 03/03/2016 7:42 AM

## 2016-03-03 NOTE — Clinical Documentation Improvement (Signed)
Critical Care  Abnormal Lab/Test Results:  02/29/16: K= 5.9  Possible Clinical Conditions associated with below indicators  Hyperkalemia  Other Condition  Cannot Clinically Determine   Supporting Information:  03/01/16: K= 4.6  Treatment Provided: 02/29/16: Kayexalate 30 gm via tube ordered   Please exercise your independent, professional judgment when responding. A specific answer is not anticipated or expected.   Thank You,  Rolm Gala, RN, [place] [phone]

## 2016-03-04 ENCOUNTER — Inpatient Hospital Stay: Payer: Medicare Other

## 2016-03-04 LAB — CBC
HEMATOCRIT: 34.7 % — AB (ref 35.0–47.0)
HEMOGLOBIN: 11.3 g/dL — AB (ref 12.0–16.0)
MCH: 29.8 pg (ref 26.0–34.0)
MCHC: 32.6 g/dL (ref 32.0–36.0)
MCV: 91.5 fL (ref 80.0–100.0)
Platelets: 182 10*3/uL (ref 150–440)
RBC: 3.8 MIL/uL (ref 3.80–5.20)
RDW: 13.3 % (ref 11.5–14.5)
WBC: 13.2 10*3/uL — ABNORMAL HIGH (ref 3.6–11.0)

## 2016-03-04 LAB — BASIC METABOLIC PANEL
Anion gap: 5 (ref 5–15)
BUN: 29 mg/dL — ABNORMAL HIGH (ref 6–20)
CHLORIDE: 104 mmol/L (ref 101–111)
CO2: 34 mmol/L — AB (ref 22–32)
CREATININE: 0.62 mg/dL (ref 0.44–1.00)
Calcium: 8.8 mg/dL — ABNORMAL LOW (ref 8.9–10.3)
GFR calc non Af Amer: >60 mL/min (ref >60)
GLUCOSE: 173 mg/dL — AB (ref 65–99)
Potassium: 3.9 mmol/L (ref 3.5–5.1)
Sodium: 143 mmol/L (ref 135–145)

## 2016-03-04 LAB — GLUCOSE, CAPILLARY
GLUCOSE-CAPILLARY: 190 mg/dL — AB (ref 65–99)
GLUCOSE-CAPILLARY: 134 mg/dL — AB (ref 65–99)
GLUCOSE-CAPILLARY: 188 mg/dL — AB (ref 65–99)
GLUCOSE-CAPILLARY: 164 mg/dL — AB (ref 65–99)
GLUCOSE-CAPILLARY: 142 mg/dL — AB (ref 65–99)
GLUCOSE-CAPILLARY: 159 mg/dL — AB (ref 65–99)
GLUCOSE-CAPILLARY: 162 mg/dL — AB (ref 65–99)
GLUCOSE-CAPILLARY: 169 mg/dL — AB (ref 65–99)
Glucose-Capillary: 117 mg/dL — ABNORMAL HIGH (ref 65–99)
Glucose-Capillary: 137 mg/dL — ABNORMAL HIGH (ref 65–99)
Glucose-Capillary: 145 mg/dL — ABNORMAL HIGH (ref 65–99)
Glucose-Capillary: 147 mg/dL — ABNORMAL HIGH (ref 65–99)
Glucose-Capillary: 149 mg/dL — ABNORMAL HIGH (ref 65–99)
Glucose-Capillary: 149 mg/dL — ABNORMAL HIGH (ref 65–99)
Glucose-Capillary: 153 mg/dL — ABNORMAL HIGH (ref 65–99)
Glucose-Capillary: 155 mg/dL — ABNORMAL HIGH (ref 65–99)
Glucose-Capillary: 158 mg/dL — ABNORMAL HIGH (ref 65–99)
Glucose-Capillary: 161 mg/dL — ABNORMAL HIGH (ref 65–99)
Glucose-Capillary: 161 mg/dL — ABNORMAL HIGH (ref 65–99)
Glucose-Capillary: 166 mg/dL — ABNORMAL HIGH (ref 65–99)
Glucose-Capillary: 171 mg/dL — ABNORMAL HIGH (ref 65–99)
Glucose-Capillary: 177 mg/dL — ABNORMAL HIGH (ref 65–99)
Glucose-Capillary: 180 mg/dL — ABNORMAL HIGH (ref 65–99)
Glucose-Capillary: 99 mg/dL (ref 65–99)

## 2016-03-04 LAB — PROCALCITONIN: Procalcitonin: <0.1 ng/mL

## 2016-03-04 NOTE — Progress Notes (Signed)
Pt continues on insulin gtt. Has been fluctuating with readings, and gtt adjusted accordingly. Spiked a temp of 101 after midnight and was medicated with Tylenol.  Continue on IV ABX, and tube feeding. Responds to voice and touch, able to follow some simple commands. Family in to visit last pm.

## 2016-03-04 NOTE — Progress Notes (Signed)
PULMONARY / CRITICAL CARE MEDICINE   Name: Ebony [name] MRN: [id number] DOB: [birth year]    ADMISSION DATE:  02/27/2016  BRIEF HISTORY: Ebony [name] is a 70 year old female with medical history of HTN, DM,chronic respiratory failure due to COPD,sleep apnea, Hyperlipidemia, hypothyroidism, depression, morbid obesity who presented to [facility] on 4/16 with increased shortness of breath . She was placed on BiPAP and transitioned to HFNC, but the patient continues to complaint of increased shortness of breath, tightness, patient had a hard time to speak in full sentences. Patient was transferred to ICU for further and PCCM team was consulted for further management. Patient denies any chest pain, nausea, vomiting, dizziness , diaphoresis. Patient was found to be lethargic and had increased work of breathing therefore was intubated on 02/28/18  SUBJECTIVE:  Patient remains orally intubated and on norepi and fentanyl drip.  Patient failed PSV trial, increased WOB, agitation   SIGNIFICANT EVENTS: 4/17>>AECOPD, Bronchitis, requiring continuous Bipap 4/18>>intermitted bipap, bridge with HFNC  VITAL SIGNS: Temp:  [100 F (37.8 C)-101.7 F (38.7 C)] 100.8 F (38.2 C) (04/22 0800) Pulse Rate:  [58-96] 79 (04/22 0800) Resp:  [22-25] 24 (04/22 0800) BP: (96-192)/(23-63) 149/63 mmHg (04/22 0800) SpO2:  [93 %-99 %] 93 % (04/22 0923) FiO2 (%):  [35 %] 35 % (04/22 0923) HEMODYNAMICS:   VENTILATOR SETTINGS: Vent Mode:  [-] PRVC FiO2 (%):  [35 %] 35 % Set Rate:  [24 bmp] 24 bmp Vt Set:  [450 mL] 450 mL PEEP:  [5 cmH20] 5 cmH20 Plateau Pressure:  [28 cmH20] 28 cmH20 INTAKE / OUTPUT:  Intake/Output Summary (Last 24 hours) at 03/04/16 0933 Last data filed at 03/04/16 0600  Gross per 24 hour  Intake 5037.32 ml  Output   2550 ml  Net 2487.32 ml    Review of Systems  Unable to perform ROS: critical illness    Physical Exam  Constitutional: She appears distressed.  Eyes: Pupils are equal,  round, and reactive to light.  Neck: Normal range of motion. Neck supple.  Cardiovascular: Normal rate and regular rhythm.   No murmur heard. Pulmonary/Chest: She has wheezes.  Neurological:  gcs<8T  Skin: Skin is warm. She is diaphoretic.    CBC  Recent Labs Lab 03/02/16 1247 03/03/16 0959 03/04/16 0441  WBC 14.7* 14.4* 13.2*  HGB 12.4 12.1 11.3*  HCT 38.3 36.9 34.7*  PLT 186 174 182   Coag's No results for input(s): APTT, INR in the last 168 hours. BMET  Recent Labs Lab 03/02/16 1247 03/03/16 0959 03/04/16 0441  NA 139 141 143  K 4.2 3.8 3.9  CL 99* 103 104  CO2 31 35* 34*  BUN 68* 45* 29*  CREATININE 1.41* 0.89 0.62  GLUCOSE 281* 147* 173*   Electrolytes  Recent Labs Lab 02/29/16 1510  03/01/16 0537 03/02/16 1247 03/03/16 0959 03/04/16 0441  CALCIUM  --   < > 9.6 9.0 9.1 8.8*  MG 2.0  --  2.1  --   --   --   PHOS 4.8*  --  4.0  --   --   --   < > = values in this interval not displayed. Sepsis Markers  Recent Labs Lab 03/03/16 0959 03/04/16 0441  PROCALCITON <0.10 <0.10   ABG  Recent Labs Lab 02/29/16 1842 03/01/16 0900 03/01/16 1410  PHART 7.25* 7.27* 7.34*  PCO2ART 87* 77* 66*  PO2ART 179* 58* 101   Liver Enzymes  Recent Labs Lab 02/27/16 0718  AST 64*  ALT 63*  ALKPHOS 37*  BILITOT 0.6  ALBUMIN 4.0   Cardiac Enzymes  Recent Labs Lab 02/27/16 0718  TROPONINI <0.03   Glucose  Recent Labs Lab 03/04/16 0302 03/04/16 0416 03/04/16 0506 03/04/16 0614 03/04/16 0654 03/04/16 0803  GLUCAP 180* 177* 190* 134* 149* 188*    Imaging Dg Chest Port 1 View  03/04/2016  CLINICAL DATA:  Acute respiratory failure.  Subsequent encounter. EXAM: PORTABLE CHEST 1 VIEW COMPARISON:  03/02/2016 FINDINGS: Lungs mildly hyperexpanded. No evidence of pneumonia or pulmonary edema. No convincing pleural effusion and no pneumothorax. Endotracheal tube and oral/ nasogastric tube are stable in well positioned. IMPRESSION: 1. No acute findings  in the lungs. 2. Well-positioned support apparatus. 3. No change from the previous day's study. Electronically Signed   By: Lajean Manes M.D.   On: 03/04/2016 07:48    STUDIES:  4/4/ 17 >> CT chest was Negative for acute pulmonary embolism.Mild nodularity in the lung bases, with resolution of nodularopacities observed in November and development of a new 6 mm rightlower lobe nodule. This is most likely inflammatory given the interval changes.  CULTURES: Blood 4/20>> Sputum 4/20>> Urine 4/20>>  ANTIBIOTICS: 4/16 levaquin>>4/20  4/20 Zosyn >>4/21 Meropenem 4/21>> Vanc 4/21>>  SIGNIFICANT EVENTS: 4/17> Transferred to ICU  LINES/TUBES: NONE  DISCUSSION:  70 year old female with known history of chronic respiratory failure due to COPD, Sleep apnea, hyperlipidemia, now with admitted with AECOPD/Bronchitis, now with persistent fevers  ASSESSMENT / PLAN:  PULMONARY A: Acute on chronic respiratory failure COPD exacerbation with acute bronchiolitis Right lower lobe lung nodule (benign and old) Persistent fevers High Peak pressure on the Vent P:  Added zosyn   SBT trials in am as tolerated by the patient with goals of extubation Continue pulmicort/xopnex/ atrovent Continue steroid taper over 14 days Increase Abx coverage to meropenem and vanc due to persistent fevers Elevated vent peak pressure - schedule BD, increase sedation,  intermittent use of NMB.  -will obtain CT chest  CARDIOVASCULAR A:  Shock Hypertension, Sinus Tachycardia Hyperlipidemia P:  Diltiazem PRN Continue Simvastatin Hold hyzaar   RENAL A:  Acute kidne injury - imrpoving P:  Monitor electrolytes Replace lytes per protocol N/S@100ml /hour Better UOP today   GASTROINTESTINAL A:  No active issues P:  Started on trickle tube feeds Protonix for GIP  HEMATOLOGIC A:  No active issues P:  lovenox for DVT prophylaxis SCD'S  INFECTIOUS A:Sepsis  Fever P:  - f\u  cultures as stated above - abx changed on 4/21  Due to persistent fevers over the past 24 hours - may need to consider CT chest and exterimity U/S if fever continue without source.   ENDOCRINE A:  Diabetese Melitus Hypothyroidism P:  Stop oral hypoglycemics ICU hypo/hyperglycemic protocol Continue synthroid  NEUROLOGIC A:  No active issues P:  RASS goal:0 -1    I have personally obtained a history, examined the patient, evaluated Pertinent laboratory and RadioGraphic/imaging results, and  formulated the assessment and plan   The Patient requires high complexity decision making for assessment and support, frequent evaluation and titration of therapies, application of advanced monitoring technologies and extensive interpretation of multiple databases. Critical Care Time devoted to patient care services described in this note is 35 minutes.   Overall, patient is critically ill, prognosis is guarded.  Patient with Multiorgan failure and at high risk for cardiac arrest and death.    [name], M.D.  [facility]  Medical Director [place] Director [place]  Department

## 2016-03-04 NOTE — Progress Notes (Signed)
Pharmacy Antibiotic Note  Ebony [name] is a 70 y.o. female admitted on 02/27/2016 with respiratory failure .  Pharmacy has been consulted for Merrem and Vancomycin  dosing.  Plan: After discussion with MD Mungal on rounds, will broaden Zosyn to Vancomycin and Merrem since patient is not doing well.   Will give Vancomycin 1500 mg IV x 1  And will then start Vancomycin 1500 mg IV q18 hours. Trough level ordered to be drawn @ 02:00 on 04/27.   Patient now with Mold in sputum culture and > 100k yeast in urine culture. Discussed with Dr. Mortimer Fries, who requested ID consult.   Height: 5\' 3"  (160 cm) Weight: 212 lb 15.4 oz (96.6 kg) IBW/kg (Calculated) : 52.4  Temp (24hrs), Avg:100.7 F (38.2 C), Min:100 F (37.8 C), Max:101.7 F (38.7 C)   Recent Labs Lab 02/28/16 0548 02/29/16 2107 03/01/16 0537 03/02/16 1247 03/03/16 0959 03/04/16 0441  WBC 8.3  --  11.8* 14.7* 14.4* 13.2*  CREATININE 0.60 1.30* 1.82* 1.41* 0.89 0.62    Estimated Creatinine Clearance: 75.5 mL/min (by C-G formula based on Cr of 0.62).    Allergies  Allergen Reactions  . Other Hives and Rash    Shellfish. Shellfish Pt reports no allergic reaction to iodine or betadine.  . Shellfish Allergy Hives and Rash    Antimicrobials this admission: Zosyn  4/20 >> 4/21 Merrem  4/21 >>  Vancomycin 4/21  Dose adjustments this admission:   Microbiology results: 4/20 BCx: NGTD 4/20 UCx:  >100k yeast 4/20 Sputum:  Mold  MRSA PCR: negative  Thank you for allowing pharmacy to be a part of this patient's care.  Kathlynn Swofford C 03/04/2016 12:55 PM

## 2016-03-05 ENCOUNTER — Inpatient Hospital Stay: Payer: Medicare Other

## 2016-03-05 LAB — BASIC METABOLIC PANEL
Anion gap: 6 (ref 5–15)
BUN: 26 mg/dL — AB (ref 6–20)
CO2: 34 mmol/L — ABNORMAL HIGH (ref 22–32)
CREATININE: 0.57 mg/dL (ref 0.44–1.00)
Calcium: 9.2 mg/dL (ref 8.9–10.3)
Chloride: 108 mmol/L (ref 101–111)
GFR calc Af Amer: >60 mL/min (ref >60)
Glucose, Bld: 174 mg/dL — ABNORMAL HIGH (ref 65–99)
Potassium: 4 mmol/L (ref 3.5–5.1)
SODIUM: 148 mmol/L — AB (ref 135–145)

## 2016-03-05 LAB — GLUCOSE, CAPILLARY
GLUCOSE-CAPILLARY: 144 mg/dL — AB (ref 65–99)
GLUCOSE-CAPILLARY: 178 mg/dL — AB (ref 65–99)
GLUCOSE-CAPILLARY: 181 mg/dL — AB (ref 65–99)
GLUCOSE-CAPILLARY: 150 mg/dL — AB (ref 65–99)
GLUCOSE-CAPILLARY: 165 mg/dL — AB (ref 65–99)
GLUCOSE-CAPILLARY: 146 mg/dL — AB (ref 65–99)
GLUCOSE-CAPILLARY: 183 mg/dL — AB (ref 65–99)
GLUCOSE-CAPILLARY: 192 mg/dL — AB (ref 65–99)
GLUCOSE-CAPILLARY: 188 mg/dL — AB (ref 65–99)
GLUCOSE-CAPILLARY: 145 mg/dL — AB (ref 65–99)
GLUCOSE-CAPILLARY: 151 mg/dL — AB (ref 65–99)
GLUCOSE-CAPILLARY: 124 mg/dL — AB (ref 65–99)
GLUCOSE-CAPILLARY: 137 mg/dL — AB (ref 65–99)
Glucose-Capillary: 130 mg/dL — ABNORMAL HIGH (ref 65–99)
Glucose-Capillary: 135 mg/dL — ABNORMAL HIGH (ref 65–99)
Glucose-Capillary: 139 mg/dL — ABNORMAL HIGH (ref 65–99)
Glucose-Capillary: 140 mg/dL — ABNORMAL HIGH (ref 65–99)
Glucose-Capillary: 147 mg/dL — ABNORMAL HIGH (ref 65–99)
Glucose-Capillary: 149 mg/dL — ABNORMAL HIGH (ref 65–99)
Glucose-Capillary: 153 mg/dL — ABNORMAL HIGH (ref 65–99)
Glucose-Capillary: 166 mg/dL — ABNORMAL HIGH (ref 65–99)
Glucose-Capillary: 174 mg/dL — ABNORMAL HIGH (ref 65–99)
Glucose-Capillary: 196 mg/dL — ABNORMAL HIGH (ref 65–99)

## 2016-03-05 LAB — PROCALCITONIN

## 2016-03-05 LAB — CBC
HCT: 33.5 % — ABNORMAL LOW (ref 35.0–47.0)
Hemoglobin: 10.8 g/dL — ABNORMAL LOW (ref 12.0–16.0)
MCH: 29.8 pg (ref 26.0–34.0)
MCHC: 32.3 g/dL (ref 32.0–36.0)
MCV: 92.1 fL (ref 80.0–100.0)
PLATELETS: 191 10*3/uL (ref 150–440)
RBC: 3.64 MIL/uL — ABNORMAL LOW (ref 3.80–5.20)
RDW: 13.6 % (ref 11.5–14.5)
WBC: 10.6 10*3/uL (ref 3.6–11.0)

## 2016-03-05 LAB — URINE CULTURE: Culture: >=100000 — AB

## 2016-03-05 LAB — CREATININE, SERUM
Creatinine, Ser: 0.66 mg/dL (ref 0.44–1.00)
GFR calc non Af Amer: >60 mL/min (ref >60)

## 2016-03-05 LAB — MAGNESIUM: Magnesium: 1.7 mg/dL (ref 1.7–2.4)

## 2016-03-05 MED ORDER — BUPROPION HCL 75 MG PO TABS
75.0000 mg | ORAL_TABLET | Freq: Two times a day (BID) | ORAL | Status: DC
  Administered 2016-03-05 – 2016-03-12 (×14): 75 mg via NASOGASTRIC
  Filled 2016-03-05 (×14): qty 1

## 2016-03-05 MED ORDER — BISACODYL 10 MG RE SUPP
10.0000 mg | Freq: Every day | RECTAL | Status: DC | PRN
  Filled 2016-03-05: qty 1

## 2016-03-05 MED ORDER — FENTANYL BOLUS VIA INFUSION
50.0000 ug | INTRAVENOUS | Status: DC | PRN
  Administered 2016-03-10 – 2016-03-11 (×3): 50 ug via INTRAVENOUS
  Filled 2016-03-05: qty 50

## 2016-03-05 MED ORDER — ANTISEPTIC ORAL RINSE SOLUTION (CORINZ)
7.0000 mL | Freq: Four times a day (QID) | OROMUCOSAL | Status: DC
  Administered 2016-03-05 – 2016-03-11 (×26): 7 mL via OROMUCOSAL
  Filled 2016-03-05 (×28): qty 7

## 2016-03-05 MED ORDER — FENTANYL CITRATE (PF) 100 MCG/2ML IJ SOLN
100.0000 ug | Freq: Once | INTRAMUSCULAR | Status: AC
  Administered 2016-03-05: 100 ug via INTRAVENOUS
  Filled 2016-03-05: qty 2

## 2016-03-05 MED ORDER — CHLORHEXIDINE GLUCONATE 0.12% ORAL RINSE (MEDLINE KIT)
15.0000 mL | Freq: Two times a day (BID) | OROMUCOSAL | Status: DC
  Administered 2016-03-05 – 2016-03-11 (×12): 15 mL via OROMUCOSAL
  Filled 2016-03-05 (×14): qty 15

## 2016-03-05 MED ORDER — ASPIRIN 81 MG PO CHEW
81.0000 mg | CHEWABLE_TABLET | Freq: Every day | ORAL | Status: DC
  Administered 2016-03-06 – 2016-03-13 (×8): 81 mg via NASOGASTRIC
  Filled 2016-03-05 (×8): qty 1

## 2016-03-05 MED ORDER — LEVOTHYROXINE SODIUM 200 MCG PO TABS
200.0000 ug | ORAL_TABLET | Freq: Every day | ORAL | Status: DC
Start: 2016-03-06 — End: 2016-03-11
  Administered 2016-03-06 – 2016-03-11 (×6): 200 ug via NASOGASTRIC
  Filled 2016-03-05 (×3): qty 1
  Filled 2016-03-05: qty 2
  Filled 2016-03-05 (×2): qty 1
  Filled 2016-03-05 (×2): qty 2

## 2016-03-05 MED ORDER — MIDAZOLAM HCL 2 MG/2ML IJ SOLN: 2.0000 mg | Freq: Once | INTRAMUSCULAR | Status: DC

## 2016-03-05 MED ORDER — FUROSEMIDE 10 MG/ML IJ SOLN
20.0000 mg | Freq: Every day | INTRAMUSCULAR | Status: DC
  Administered 2016-03-06 – 2016-03-07 (×2): 20 mg via INTRAVENOUS
  Filled 2016-03-05 (×2): qty 2

## 2016-03-05 MED ORDER — CITALOPRAM HYDROBROMIDE 20 MG PO TABS
40.0000 mg | ORAL_TABLET | Freq: Every day | ORAL | Status: DC
  Administered 2016-03-06 – 2016-03-07 (×2): 40 mg via NASOGASTRIC
  Filled 2016-03-05 (×2): qty 2

## 2016-03-05 MED ORDER — SENNOSIDES 8.8 MG/5ML PO SYRP
5.0000 mL | ORAL_SOLUTION | Freq: Two times a day (BID) | ORAL | Status: DC | PRN
  Administered 2016-03-05: 5 mL via ORAL
  Filled 2016-03-05: qty 5

## 2016-03-05 NOTE — Progress Notes (Addendum)
During report, patient was seen to be in acute distress. Vent alarming and heart monitor alarming for tachycardia. Called [facility] to inquire about patient's condition. NP on floor at this time as well and consulted with her as well. Pt to be restarted on fentanyl gtt at 50 mcg with initial IV push of 100 mcg. Axillary temp 102.8. Pt received Tylenol.  HR now down as well as BP at HR 99 and bp 112/54 with MAP of 71. Will continue monitoring.

## 2016-03-05 NOTE — Progress Notes (Signed)
Pt off levophed since yesterday, maintaining blood pressure. FBS continues to fluctuate, continue on insulin gtt with hourly adjustments. Had one episode this AM of frothy sputum, called RT for assistance with suctioning.  Continue on IV ABX and tube feeding. Family called last pm to inquire about pt's condition. Unable to wean off vent during weaning trial yesterday.

## 2016-03-05 NOTE — Progress Notes (Signed)
Pt axillary temp remains at 102.1, NP suggested giving Tylenol suppository. Medication administered. Continue to monitor.

## 2016-03-05 NOTE — Progress Notes (Signed)
Pharmacy Antibiotic Note  Ebony [name] is a 71 y.o. female admitted on 02/27/2016 with respiratory failure .  Pharmacy has been consulted for Merrem and Vancomycin  dosing.  Plan: Will continue current orders for vancomycin 1500mg  IV Q18H and meropenem 1gm IV Q8H Trough level ordered to be drawn @ 02:00 on 04/27.   Patient now with Mold in sputum culture and > 100k yeast in urine culture. Discussed with Dr. Mortimer Fries, who requested ID consult.   Height: 5\' 3"  (160 cm) Weight: 237 lb 10.5 oz (107.8 kg) IBW/kg (Calculated) : 52.4  Temp (24hrs), Avg:98.7 F (37.1 C), Min:97.9 F (36.6 C), Max:99.7 F (37.6 C)   Recent Labs Lab 03/01/16 0537 03/02/16 1247 03/03/16 0959 03/04/16 0441 03/05/16 0500 03/05/16 0531  WBC 11.8* 14.7* 14.4* 13.2*  --  10.6  CREATININE 1.82* 1.41* 0.89 0.62 0.66 0.57    Estimated Creatinine Clearance: 80.4 mL/min (by C-G formula based on Cr of 0.57).    Allergies  Allergen Reactions  . Other Hives and Rash    Shellfish. Shellfish Pt reports no allergic reaction to iodine or betadine.  . Shellfish Allergy Hives and Rash    Antimicrobials this admission: Zosyn  4/20 >> 4/21 Merrem  4/21 >>  Vancomycin 4/21  Dose adjustments this admission:   Microbiology results: 4/20 BCx: NGTD 4/20 UCx:  >100k yeast 4/20 Sputum:  Mold  MRSA PCR: negative  Thank you for allowing pharmacy to be a part of this patient's care.  Ada Holness C 03/05/2016 1:36 PM

## 2016-03-05 NOTE — Progress Notes (Signed)
Patient appears flushed, HR up to 120s, temp increased to 38.1 per monitor, taken orally 103.1, tylenol po given , and will continue to observe

## 2016-03-05 NOTE — Progress Notes (Signed)
PULMONARY / CRITICAL CARE MEDICINE   Name: Ebony [name] MRN: [id number] DOB: 1946/10/11    ADMISSION DATE:  02/27/2016  BRIEF HISTORY: Ebony [name] is a 70 year old female with medical history of HTN, DM,chronic respiratory failure due to COPD,sleep apnea, Hyperlipidemia, hypothyroidism, depression, morbid obesity who presented to [facility] on 4/16 with increased shortness of breath . She was placed on BiPAP and transitioned to HFNC, but the patient continues to complaint of increased shortness of breath, tightness, patient had a hard time to speak in full sentences. Patient was transferred to ICU for further and PCCM team was consulted for further management. Patient denies any chest pain, nausea, vomiting, dizziness , diaphoresis. Patient was found to be lethargic and had increased work of breathing therefore was intubated on 02/28/18  SUBJECTIVE:  Patient remains orally intubated, off sedation.  Patient failed PSV trial, increased WOB, lethargic, opens eyes to vocal stimuli Patient not ready for PS mode   SIGNIFICANT EVENTS: 4/17>>AECOPD, Bronchitis, requiring continuous Bipap 4/18>>intermitted bipap, bridge with HFNC  VITAL SIGNS: Temp:  [97.9 F (36.6 C)-99.5 F (37.5 C)] 99.5 F (37.5 C) (04/23 1200) Pulse Rate:  [81-116] 106 (04/23 1200) Resp:  [22-27] 24 (04/23 1200) BP: (119-190)/(50-81) 159/63 mmHg (04/23 1200) SpO2:  [93 %-100 %] 96 % (04/23 1200) FiO2 (%):  [35 %] 35 % (04/23 1152) Weight:  [237 lb 10.5 oz (107.8 kg)] 237 lb 10.5 oz (107.8 kg) (04/23 0446) HEMODYNAMICS:   VENTILATOR SETTINGS: Vent Mode:  [-] PRVC FiO2 (%):  [35 %] 35 % Set Rate:  [24 bmp] 24 bmp Vt Set:  [450 mL] 450 mL PEEP:  [5 cmH20] 5 cmH20 Plateau Pressure:  [29 cmH20] 29 cmH20 INTAKE / OUTPUT:  Intake/Output Summary (Last 24 hours) at 03/05/16 1221 Last data filed at 03/05/16 1209  Gross per 24 hour  Intake 6331.48 ml  Output   3825 ml  Net 2506.48 ml    Review of Systems  Unable to  perform ROS: critical illness    Physical Exam  Constitutional: She appears distressed.  Eyes: Pupils are equal, round, and reactive to light.  Neck: Normal range of motion. Neck supple.  Cardiovascular: Normal rate and regular rhythm.   No murmur heard. Pulmonary/Chest: She has wheezes.  Neurological:  gcs<8T  Skin: Skin is warm. She is diaphoretic.    CBC  Recent Labs Lab 03/03/16 0959 03/04/16 0441 03/05/16 0531  WBC 14.4* 13.2* 10.6  HGB 12.1 11.3* 10.8*  HCT 36.9 34.7* 33.5*  PLT 174 182 191   Coag's No results for input(s): APTT, INR in the last 168 hours. BMET  Recent Labs Lab 03/03/16 0959 03/04/16 0441 03/05/16 0500 03/05/16 0531  NA 141 143  --  148*  K 3.8 3.9  --  4.0  CL 103 104  --  108  CO2 35* 34*  --  34*  BUN 45* 29*  --  26*  CREATININE 0.89 0.62 0.66 0.57  GLUCOSE 147* 173*  --  174*   Electrolytes  Recent Labs Lab 02/29/16 1510  03/01/16 0537  03/03/16 0959 03/04/16 0441 03/05/16 0531  CALCIUM  --   < > 9.6  < > 9.1 8.8* 9.2  MG 2.0  --  2.1  --   --   --  1.7  PHOS 4.8*  --  4.0  --   --   --   --   < > = values in this interval not displayed. Sepsis Markers  Recent Labs Lab  03/03/16 0959 03/04/16 0441 03/05/16 0500  PROCALCITON <0.10 <0.10 <0.10   ABG  Recent Labs Lab 03/01/16 1410 03/04/16 0930 03/05/16 0900  PHART 7.34* 7.37 7.41  PCO2ART 66* 64* 64*  PO2ART 101 92 81*   Liver Enzymes No results for input(s): AST, ALT, ALKPHOS, BILITOT, ALBUMIN in the last 168 hours. Cardiac Enzymes No results for input(s): TROPONINI, PROBNP in the last 168 hours. Glucose  Recent Labs Lab 03/05/16 0542 03/05/16 0628 03/05/16 0734 03/05/16 0906 03/05/16 1010 03/05/16 1112  GLUCAP 165* 150* 146* 183* 192* 188*    Imaging Dg Chest Port 1 View  03/05/2016  CLINICAL DATA:  Acute respiratory failure. Worsening shortness of breath. COPD. EXAM: PORTABLE CHEST 1 VIEW COMPARISON:  03/04/2016 FINDINGS: Endotracheal tube and  nasogastric tube remain in appropriate position. The heart size and mediastinal contours are within normal limits. Both lungs are clear. The visualized skeletal structures are unremarkable. IMPRESSION: Endotracheal tube and nasogastric tube in appropriate position. No active disease. Electronically Signed   By: Earle Gell M.D.   On: 03/05/2016 10:08    STUDIES:  4/4/ 17 >> CT chest was Negative for acute pulmonary embolism.Mild nodularity in the lung bases, with resolution of nodularopacities observed in November and development of a new 6 mm rightlower lobe nodule. This is most likely inflammatory given the interval changes.  CULTURES: Blood 4/20>> Sputum 4/20>> Urine 4/20>>  ANTIBIOTICS: 4/16 levaquin>>4/20  4/20 Zosyn >>4/21 Meropenem 4/21>> Vanc 4/21>>  SIGNIFICANT EVENTS: 4/17> Transferred to ICU  LINES/TUBES: NONE  DISCUSSION:  70 year old female with known history of chronic respiratory failure due to COPD, Sleep apnea, hyperlipidemia, now with admitted with AECOPD/Bronchitis, now with persistent fevers  ASSESSMENT / PLAN:  PULMONARY A: Acute on chronic respiratory failure COPD exacerbation with acute bronchiolitis Right lower lobe lung nodule (benign and old) Persistent fevers High Peak pressure on the Vent P:  Added zosyn   SBT trials in am as tolerated by the patient with goals of extubation Continue pulmicort/xopnex/ atrovent Continue steroid taper over 14 days Increase Abx coverage to meropenem and vanc due to persistent fevers Elevated vent peak pressure - schedule BD, increase sedation,  intermittent use of NMB.  CXR reviewed, no need for CT chest  CARDIOVASCULAR A:  Shock Hypertension, Sinus Tachycardia Hyperlipidemia P:  Diltiazem PRN Continue Simvastatin Hold hyzaar   RENAL A:  Acute kidne injury - imrpoving P:  Monitor electrolytes Replace lytes per protocol  GASTROINTESTINAL A:  No active issues P:  Cont  tf's Protonix for GIP  HEMATOLOGIC A:  No active issues P:  lovenox for DVT prophylaxis SCD'S  INFECTIOUS A:Sepsis  Fever P:  - f\u cultures as stated above - abx changed on 4/21  Due to persistent fevers over the past 24 hours  ENDOCRINE A:  Diabetese Melitus Hypothyroidism P:  Stop oral hypoglycemics ICU hypo/hyperglycemic protocol Continue synthroid  NEUROLOGIC A:  No active issues P:  RASS goal:0 -1    I have personally obtained a history, examined the patient, evaluated Pertinent laboratory and RadioGraphic/imaging results, and  formulated the assessment and plan   The Patient requires high complexity decision making for assessment and support, frequent evaluation and titration of therapies, application of advanced monitoring technologies and extensive interpretation of multiple databases. Critical Care Time devoted to patient care services described in this note is 35 minutes.   Overall, patient is critically ill, prognosis is guarded.  Patient with Multiorgan failure and at high risk for cardiac arrest and death.  Failure  to wean from vent due to respiratory  muscle fatigue and severe COPD  Maretta Bees Patricia Pesa, M.D.  [facility]  Medical Director [place] Director [facility] Cardio-Pulmonary Department

## 2016-03-05 NOTE — Progress Notes (Signed)
Patient remains on vent, lethargic, will open eyes but not track or follow commands. Family here most of afternoon  And updated . VSS Remains on insulin drip and tube feeds continue

## 2016-03-06 ENCOUNTER — Inpatient Hospital Stay: Payer: Medicare Other

## 2016-03-06 ENCOUNTER — Inpatient Hospital Stay: Admit: 2016-03-06 | Payer: Medicare Other

## 2016-03-06 LAB — BLOOD GAS, ARTERIAL
ACID-BASE EXCESS: 12.9 mmol/L — AB (ref 0.0–3.0)
ALLENS TEST (PASS/FAIL): POSITIVE — AB
ALLENS TEST (PASS/FAIL): POSITIVE — AB
Acid-Base Excess: 8.9 mmol/L — ABNORMAL HIGH (ref 0.0–3.0)
BICARBONATE: 39.9 meq/L — AB (ref 21.0–28.0)
Bicarbonate: 36.4 mEq/L — ABNORMAL HIGH (ref 21.0–28.0)
FIO2: 0.35
FIO2: 0.35
O2 SAT: 95.8 %
O2 Saturation: 96.8 %
PATIENT TEMPERATURE: 37.2
PCO2 ART: 64 mmHg — AB (ref 32.0–48.0)
PEEP: 5 cmH2O
PEEP: 5 cmH2O
Patient temperature: 37.2
RATE: 24 resp/min
RATE: 24 resp/min
VT: 450 mL
VT: 450 mL
pCO2 arterial: 64 mmHg — ABNORMAL HIGH (ref 32.0–48.0)
pH, Arterial: 7.37 (ref 7.350–7.450)
pH, Arterial: 7.41 (ref 7.350–7.450)
pO2, Arterial: 92 mmHg (ref 83.0–108.0)
pO2, Arterial: 81 mmHg — ABNORMAL LOW (ref 83.0–108.0)

## 2016-03-06 LAB — MAGNESIUM: MAGNESIUM: 1.7 mg/dL (ref 1.7–2.4)

## 2016-03-06 LAB — CBC
HCT: 32.2 % — ABNORMAL LOW (ref 35.0–47.0)
Hemoglobin: 10.3 g/dL — ABNORMAL LOW (ref 12.0–16.0)
MCH: 29.4 pg (ref 26.0–34.0)
MCHC: 32.1 g/dL (ref 32.0–36.0)
MCV: 91.8 fL (ref 80.0–100.0)
PLATELETS: 210 10*3/uL (ref 150–440)
RBC: 3.51 MIL/uL — AB (ref 3.80–5.20)
RDW: 13.7 % (ref 11.5–14.5)
WBC: 13.2 10*3/uL — AB (ref 3.6–11.0)

## 2016-03-06 LAB — GLUCOSE, CAPILLARY
GLUCOSE-CAPILLARY: 164 mg/dL — AB (ref 65–99)
GLUCOSE-CAPILLARY: 169 mg/dL — AB (ref 65–99)
GLUCOSE-CAPILLARY: 178 mg/dL — AB (ref 65–99)
GLUCOSE-CAPILLARY: 178 mg/dL — AB (ref 65–99)
GLUCOSE-CAPILLARY: 183 mg/dL — AB (ref 65–99)
GLUCOSE-CAPILLARY: 164 mg/dL — AB (ref 65–99)
GLUCOSE-CAPILLARY: 145 mg/dL — AB (ref 65–99)
GLUCOSE-CAPILLARY: 145 mg/dL — AB (ref 65–99)
GLUCOSE-CAPILLARY: 147 mg/dL — AB (ref 65–99)
GLUCOSE-CAPILLARY: 126 mg/dL — AB (ref 65–99)
Glucose-Capillary: 143 mg/dL — ABNORMAL HIGH (ref 65–99)
Glucose-Capillary: 148 mg/dL — ABNORMAL HIGH (ref 65–99)
Glucose-Capillary: 158 mg/dL — ABNORMAL HIGH (ref 65–99)
Glucose-Capillary: 162 mg/dL — ABNORMAL HIGH (ref 65–99)
Glucose-Capillary: 163 mg/dL — ABNORMAL HIGH (ref 65–99)
Glucose-Capillary: 168 mg/dL — ABNORMAL HIGH (ref 65–99)
Glucose-Capillary: 170 mg/dL — ABNORMAL HIGH (ref 65–99)
Glucose-Capillary: 176 mg/dL — ABNORMAL HIGH (ref 65–99)
Glucose-Capillary: 178 mg/dL — ABNORMAL HIGH (ref 65–99)
Glucose-Capillary: 191 mg/dL — ABNORMAL HIGH (ref 65–99)
Glucose-Capillary: 204 mg/dL — ABNORMAL HIGH (ref 65–99)
Glucose-Capillary: 206 mg/dL — ABNORMAL HIGH (ref 65–99)
Glucose-Capillary: 93 mg/dL (ref 65–99)

## 2016-03-06 LAB — BASIC METABOLIC PANEL
ANION GAP: 5 (ref 5–15)
BUN: 29 mg/dL — ABNORMAL HIGH (ref 6–20)
CO2: 36 mmol/L — ABNORMAL HIGH (ref 22–32)
Calcium: 9.2 mg/dL (ref 8.9–10.3)
Chloride: 107 mmol/L (ref 101–111)
Creatinine, Ser: 0.56 mg/dL (ref 0.44–1.00)
GFR calc Af Amer: >60 mL/min (ref >60)
Glucose, Bld: 199 mg/dL — ABNORMAL HIGH (ref 65–99)
POTASSIUM: 4.1 mmol/L (ref 3.5–5.1)
Sodium: 148 mmol/L — ABNORMAL HIGH (ref 135–145)

## 2016-03-06 LAB — VANCOMYCIN, TROUGH: Vancomycin Tr: 10 ug/mL (ref 10–20)

## 2016-03-06 MED ORDER — VORICONAZOLE 200 MG IV SOLR
4.0000 mg/kg | Freq: Two times a day (BID) | INTRAVENOUS | Status: DC
  Administered 2016-03-07 – 2016-03-10 (×6): 290 mg via INTRAVENOUS
  Filled 2016-03-06 (×9): qty 290

## 2016-03-06 MED ORDER — VANCOMYCIN HCL 10 G IV SOLR
1500.0000 mg | Freq: Two times a day (BID) | INTRAVENOUS | Status: DC
  Administered 2016-03-06 – 2016-03-08 (×4): 1500 mg via INTRAVENOUS
  Filled 2016-03-06 (×5): qty 1500

## 2016-03-06 MED ORDER — SENNOSIDES 8.8 MG/5ML PO SYRP
5.0000 mL | ORAL_SOLUTION | Freq: Every day | ORAL | Status: DC
Start: 2016-03-06 — End: 2016-03-14
  Administered 2016-03-06 – 2016-03-13 (×5): 5 mL
  Filled 2016-03-06 (×5): qty 5

## 2016-03-06 MED ORDER — POLYETHYLENE GLYCOL 3350 17 G PO PACK
17.0000 g | PACK | Freq: Two times a day (BID) | ORAL | Status: DC
  Administered 2016-03-06 – 2016-03-08 (×6): 17 g
  Filled 2016-03-06 (×6): qty 1

## 2016-03-06 MED ORDER — VORICONAZOLE 200 MG IV SOLR
6.0000 mg/kg | Freq: Two times a day (BID) | INTRAVENOUS | Status: AC
  Administered 2016-03-06 – 2016-03-07 (×2): 430 mg via INTRAVENOUS
  Filled 2016-03-06 (×2): qty 430

## 2016-03-06 MED ORDER — FREE WATER
200.0000 mL | Freq: Three times a day (TID) | Status: DC
  Administered 2016-03-06 – 2016-03-09 (×9): 200 mL

## 2016-03-06 MED ORDER — SODIUM CHLORIDE 0.45 % IV SOLN
INTRAVENOUS | Status: DC
  Administered 2016-03-06 – 2016-03-07 (×2): via INTRAVENOUS

## 2016-03-06 MED ORDER — IBUPROFEN 600 MG PO TABS
600.0000 mg | ORAL_TABLET | ORAL | Status: DC | PRN
  Administered 2016-03-06 – 2016-03-07 (×3): 600 mg via ORAL
  Filled 2016-03-06: qty 1
  Filled 2016-03-06 (×2): qty 2

## 2016-03-06 NOTE — Progress Notes (Signed)
Chaplain rounded the unit and provided a compassionate presence and spiritual support to the patient with silent prayer.Ebony [name] [phone]

## 2016-03-06 NOTE — Progress Notes (Signed)
Pharmacy Antibiotic Note  Ebony [name] is a 70 y.o. female admitted on 02/27/2016 with respiratory failure .  Pharmacy has been consulted for Merrem and Vancomycin  dosing.  Plan: Will continue current orders for vancomycin 1500mg  IV Q18H and meropenem 1gm IV Q8H Trough level ordered to be drawn @ 02:00 on 04/27.   Patient now with Mold in sputum culture and > 100k yeast in urine culture. Discussed with Dr. Mortimer Fries, who requested ID consult.   Height: 5\' 3"  (160 cm) Weight: 237 lb 10.5 oz (107.8 kg) IBW/kg (Calculated) : 52.4  Temp (24hrs), Avg:100.4 F (38 C), Min:98.8 F (37.1 C), Max:102.8 F (39.3 C)   Recent Labs Lab 03/01/16 0537 03/02/16 1247 03/03/16 0959 03/04/16 0441 03/05/16 0500 03/05/16 0531 03/06/16 0224  WBC 11.8* 14.7* 14.4* 13.2*  --  10.6  --   CREATININE 1.82* 1.41* 0.89 0.62 0.66 0.57  --   VANCOTROUGH  --   --   --   --   --   --  10    Estimated Creatinine Clearance: 80.4 mL/min (by C-G formula based on Cr of 0.57).    Allergies  Allergen Reactions  . Other Hives and Rash    Shellfish. Shellfish Pt reports no allergic reaction to iodine or betadine.  . Shellfish Allergy Hives and Rash    Antimicrobials this admission: Zosyn  4/20 >> 4/21 Merrem  4/21 >>  Vancomycin 4/21  Dose adjustments this admission:  4/24 02:00 vanc level 10. Changing to 1500 mg q 12 hours. Level before 4th new dose.   Microbiology results: 4/20 BCx: NGTD 4/20 UCx:  >100k yeast 4/20 Sputum:  Mold  MRSA PCR: negative  Thank you for allowing pharmacy to be a part of this patient's care.  Lucielle Vokes S 03/06/2016 3:22 AM

## 2016-03-06 NOTE — Progress Notes (Signed)
ANTIBIOTIC CONSULT NOTE - INITIAL  Pharmacy Consult for Voriconazole  Indication: fungal infection  Allergies  Allergen Reactions  . Other Hives and Rash    Shellfish. Shellfish Pt reports no allergic reaction to iodine or betadine.  . Shellfish Allergy Hives and Rash    Patient Measurements: Height: 5\' 3"  (160 cm) Weight: 225 lb 15.5 oz (102.5 kg) IBW/kg (Calculated) : 52.4 Adjusted Body Weight: 72.4 kg   Vital Signs: Temp: 102 F (38.9 C) (04/24 1617) BP: 130/53 mmHg (04/24 1617) Pulse Rate: 80 (04/24 1617) Intake/Output from previous day: 04/23 0701 - 04/24 0700 In: 5151 [P.O.:60; I.V.:2481; NG/GT:1410; IV Piggyback:1200] Out: 2425 [Urine:2425] Intake/Output from this shift: Total I/O In: 158.2 [I.V.:158.2] Out: 1075 [Urine:1075]  Labs:  Recent Labs  03/04/16 0441 03/05/16 0500 03/05/16 0531 03/06/16 0633  WBC 13.2*  --  10.6 13.2*  HGB 11.3*  --  10.8* 10.3*  PLT 182  --  191 210  CREATININE 0.62 0.66 0.57 0.56   Estimated Creatinine Clearance: 78 mL/min (by C-G formula based on Cr of 0.56).  Recent Labs  03/06/16 0224  [facility] 10     Microbiology: Recent Results (from the past 720 hour(s))  MRSA PCR Screening     Status: None   Collection Time: 02/28/16  4:43 PM  Result Value Ref Range Status   MRSA by PCR NEGATIVE NEGATIVE Final    Comment:        The GeneXpert MRSA Assay (FDA approved for NASAL specimens only), is one component of a comprehensive MRSA colonization surveillance program. It is not intended to diagnose MRSA infection nor to guide or monitor treatment for MRSA infections.   Culture, blood (Routine X 2) w Reflex to ID Panel     Status: None (Preliminary result)   Collection Time: 03/02/16  2:05 PM  Result Value Ref Range Status   Specimen Description BLOOD RIGHT HAND  Final   Special Requests   Final    BOTTLES DRAWN AEROBIC AND ANAEROBIC ANA 2ML AER .5ML   Culture NO GROWTH 3 DAYS  Final   Report Status PENDING   Incomplete  Culture, expectorated sputum-assessment     Status: None   Collection Time: 03/02/16  3:25 PM  Result Value Ref Range Status   Specimen Description TRACHEAL ASPIRATE  Final   Special Requests NONE  Final   Sputum evaluation THIS SPECIMEN IS ACCEPTABLE FOR SPUTUM CULTURE  Final   Report Status 03/02/2016 FINAL  Final  Culture, respiratory (NON-Expectorated)     Status: None (Preliminary result)   Collection Time: 03/02/16  3:25 PM  Result Value Ref Range Status   Specimen Description TRACHEAL ASPIRATE  Final   Special Requests NONE Reflexed from [id number]  Final   Gram Stain MODERATE WBC SEEN FEW HYPHAE   Final   Culture   Final    MODERATE GROWTH MOLD REFERRED TO [facility] IN [place], [place] IDENTIFICATION/CONFIRMATION    Report Status PENDING  Incomplete  Culture, blood (Routine X 2) w Reflex to ID Panel     Status: None (Preliminary result)   Collection Time: 03/02/16  3:34 PM  Result Value Ref Range Status   Specimen Description BLOOD RIGHT HAND  Final   Special Requests BOTTLES DRAWN AEROBIC AND ANAEROBIC   10CC  Final   Culture NO GROWTH 3 DAYS  Final   Report Status PENDING  Incomplete  Urine culture     Status: Abnormal   Collection Time: 03/02/16  5:19 PM  Result Value Ref Range Status   Specimen Description URINE, RANDOM  Final   Special Requests NONE  Final   Culture >=100,000 COLONIES/mL CANDIDA KRUSEI (A)  Final   Report Status 03/05/2016 FINAL  Final    Medical History: Past Medical History  Diagnosis Date  . Hyperlipidemia   . Hypertension   . Diabetes mellitus type 2, uncomplicated ([place])   . Hypothyroidism   . Psoriasis   . Depression   . COPD (chronic obstructive pulmonary disease) ([place])   . Osteoporosis, post-menopausal   . Vitamin D deficiency   . Sleep apnea   . Carpal tunnel syndrome   . Breast cancer ([place])   . Asthma     Medications:  Prescriptions prior to admission  Medication Sig Dispense  Refill Last Dose  . Adalimumab (HUMIRA PEN-PSORIASIS STARTER) 40 MG/0.8ML PNKT Inject 40 mg into the skin every 14 (fourteen) days.    02/25/2016  . albuterol (PROAIR HFA) 108 (90 BASE) MCG/ACT inhaler INHALE 2 PUFFS BY MOUTH EVERY 4 HOURS AS NEEDED FOR SHORTNESS OF BREATH AND/OR WHEEZING.   02/26/2016 at Unknown time  . albuterol (PROVENTIL) (2.5 MG/3ML) 0.083% nebulizer solution Inhale 1 vial using nebulizer every six hours as needed for shortness of breath and/or wheezing.   02/26/2016 at Unknown time  . aspirin EC 81 MG tablet Take 81 mg by mouth every morning.    02/26/2016 at Unknown time  . budesonide-formoterol (SYMBICORT) 160-4.5 MCG/ACT inhaler INHALE 2 PUFFS BY MOUTH TWICE DAILY   02/26/2016 at Unknown time  . buPROPion (WELLBUTRIN XL) 150 MG 24 hr tablet Take 150 mg by mouth every morning.    02/26/2016 at Unknown time  . citalopram (CELEXA) 40 MG tablet Take 40 mg by mouth every morning.   02/26/2016 at Unknown time  . clobetasol (TEMOVATE) 0.05 % external solution Apply 1 application topically daily as needed (for psoriasis.).    Past Week at Unknown time  . doxycycline (VIBRAMYCIN) 100 MG capsule Take 1 capsule (100 mg total) by mouth 2 (two) times daily. 28 capsule 0 02/26/2016 at Unknown time  . furosemide (LASIX) 20 MG tablet Take 40mg  by mouth once a day.   02/26/2016 at Unknown time  . glipiZIDE (GLUCOTROL) 5 MG tablet Take 1 tablet by mouth daily before lunch.    02/26/2016 at Unknown time  . insulin detemir (LEVEMIR) 100 UNIT/ML injection Inject 85 units subcutaneously twice a day   02/26/2016 at Unknown time  . insulin lispro (HUMALOG KWIKPEN) 100 UNIT/ML KiwkPen Inject 38-48 Units into the skin 3 (three) times daily.   02/26/2016 at Unknown time  . INVOKANA 100 MG TABS tablet Take 100 mg by mouth daily.    02/26/2016 at Unknown time  . letrozole (FEMARA) 2.5 MG tablet Take 1 tablet (2.5 mg total) by mouth daily. 90 tablet 3 02/26/2016 at Unknown time  . levothyroxine (SYNTHROID, LEVOTHROID)  200 MCG tablet Take 200 mcg by mouth daily before breakfast. *Take along with 50 mcg to equal 250 mcg total dose*   02/26/2016 at Unknown time  . levothyroxine (SYNTHROID, LEVOTHROID) 50 MCG tablet Take 50 mcg by mouth daily before breakfast. *Take along with 200 mcg tablet to equal 250 mcg total dose.*   02/26/2016 at Unknown time  . losartan-hydrochlorothiazide (HYZAAR) 100-25 MG tablet Take 1 tablet by mouth daily. In am.   02/26/2016 at Unknown time  . metFORMIN (GLUCOPHAGE) 500 MG tablet Take 1,000 mg by mouth 2 (two) times daily with a meal.   02/26/2016  at Unknown time  . potassium chloride SA (K-DUR,KLOR-CON) 20 MEQ tablet Take 2 tablets (40 mEq total) by mouth 2 (two) times daily. 20 tablet 0 02/26/2016 at Unknown time  . simvastatin (ZOCOR) 40 MG tablet Take 40 mg by mouth at bedtime.    02/26/2016 at Unknown time  . tiotropium (SPIRIVA) 18 MCG inhalation capsule Place 18 mcg into inhaler and inhale daily.   02/26/2016 at Unknown time  . levofloxacin (LEVAQUIN) 500 MG tablet Take 1 tablet (500 mg total) by mouth daily. 5 tablet 0 Taking  . predniSONE (DELTASONE) 20 MG tablet Take 2 tablets (40 mg total) by mouth daily. 8 tablet 0 Taking   Assessment: Pharmacy consulted to dose voriconazole in this 70 year old female with mold growing in tracheal aspirate, Candida krusei growing in urine culture.  Pt is 96 % over IBW, will use AdjBW to dose.   Goal of Therapy:  resolution of infection   Plan:  Expected duration 7 days with resolution of temperature and/or normalization of WBC   Voriconazole 430 mg IV Q12H X 2 doses to start on 4/24 @ 17:00 followed by Voriconazole 290 mg IV Q12H  To start 4/25 @ 17:00.   Kaytlen Lightsey D 03/06/2016,4:43 PM

## 2016-03-06 NOTE — Progress Notes (Signed)
Notified ICU nurse practioner [place] in regards to patient having 18 beat run of V-tach with reentry of NSR. Stated will check magnesium level. 

## 2016-03-06 NOTE — Progress Notes (Signed)
Update Asked by Dr. Ashby Dawes to assess patient for new CVL, due to persistent fevers/sepsis. Currently with R Fem CVL and spiking fevers. RIJ attempt was difficult due to small lumen and several layers of muscles and subcutaneous fat. Evaluated LIJ with U/S, again with small lumen and about 3 cm of tissue to puncture. Discuss with Dr. Ashby Dawes, given current FUO and sepsis, patient will require long term IV access. PICC line would be more appropriate in this patient.   Plan - PICC line placement ordered   Vilinda Boehringer, MD [facility] Pager [phone] (please enter 7-digits) On Call Pager - [phone] (please enter 7-digits)

## 2016-03-06 NOTE — Progress Notes (Signed)
Discussed with Dr. Stevenson Clinch, agree with PICC line, as I attempted right femoral central line previously after failed attempts at right IJ and right subclavian lines due to difficult vascular access and inability to advance the guidewire. Marda Stalker M.D. 03/06/2016

## 2016-03-06 NOTE — Progress Notes (Signed)
PULMONARY / CRITICAL CARE MEDICINE   Name: Ebony [name] MRN: [id number] DOB: 1946-11-06    ADMISSION DATE:  02/27/2016  BRIEF HISTORY: Ebony [name] is a 70 year old female with medical history of HTN, DM,chronic respiratory failure due to COPD,sleep apnea, Hyperlipidemia, hypothyroidism, depression, morbid obesity who presented to [facility] on 4/16 with increased shortness of breath . She was placed on BiPAP and transitioned to HFNC, but the patient continues to complaint of increased shortness of breath, tightness, patient had a hard time to speak in full sentences. Patient was transferred to ICU for further and PCCM team was consulted for further management. Patient denies any chest pain, nausea, vomiting, dizziness , diaphoresis. Patient was found to be lethargic and had increased work of breathing therefore was intubated on 02/28/18  SUBJECTIVE:  Patient remains orally intubated,Patient is off sedation. Patient was febrile overnight.  She also had 18 beats of V-tach this morning. Repeat Mg now.   SIGNIFICANT EVENTS: 4/17>>AECOPD, Bronchitis, requiring continuous Bipap 4/18>>intermitted bipap, bridge with HFNC  VITAL SIGNS: Temp:  [99 F (37.2 C)-102.8 F (39.3 C)] 100.9 F (38.3 C) (04/24 0800) Pulse Rate:  [77-133] 79 (04/24 0800) Resp:  [22-24] 24 (04/24 0800) BP: (110-180)/(50-79) 110/53 mmHg (04/24 0800) SpO2:  [93 %-98 %] 98 % (04/24 0800) FiO2 (%):  [35 %] 35 % (04/24 0600) Weight:  [225 lb 15.5 oz (102.5 kg)] 225 lb 15.5 oz (102.5 kg) (04/24 0540) HEMODYNAMICS:   VENTILATOR SETTINGS: Vent Mode:  [-] PRVC FiO2 (%):  [35 %] 35 % Set Rate:  [24 bmp] 24 bmp Vt Set:  [450 mL] 450 mL PEEP:  [5 cmH20] 5 cmH20 Plateau Pressure:  [19 cmH20] 19 cmH20 INTAKE / OUTPUT:  Intake/Output Summary (Last 24 hours) at 03/06/16 0911 Last data filed at 03/06/16 0600  Gross per 24 hour  Intake 4775.93 ml  Output   2100 ml  Net 2675.93 ml    Review of Systems  Unable to perform  ROS: critical illness    Physical Exam  Constitutional: No distress.  HENT:  Head: Normocephalic and atraumatic.  Eyes: Pupils are equal, round, and reactive to light. Right eye exhibits no discharge. Left eye exhibits no discharge.  Neck: Normal range of motion. Neck supple. No JVD present. No tracheal deviation present.  Cardiovascular: Normal rate and regular rhythm.   No murmur heard. Pulmonary/Chest: She has no wheezes. She has no rales.  Abdominal: She exhibits distension. There is no tenderness. There is no guarding.  Neurological:  gcs<8T  Skin: Skin is warm. She is not diaphoretic.    CBC  Recent Labs Lab 03/04/16 0441 03/05/16 0531 03/06/16 0633  WBC 13.2* 10.6 13.2*  HGB 11.3* 10.8* 10.3*  HCT 34.7* 33.5* 32.2*  PLT 182 191 210   Coag's No results for input(s): APTT, INR in the last 168 hours. BMET  Recent Labs Lab 03/04/16 0441 03/05/16 0500 03/05/16 0531 03/06/16 0633  NA 143  --  148* 148*  K 3.9  --  4.0 4.1  CL 104  --  108 107  CO2 34*  --  34* 36*  BUN 29*  --  26* 29*  CREATININE 0.62 0.66 0.57 0.56  GLUCOSE 173*  --  174* 199*   Electrolytes  Recent Labs Lab 02/29/16 1510  03/01/16 0537  03/04/16 0441 03/05/16 0531 03/06/16 [id number]  CALCIUM  --   < > 9.6  < > 8.8* 9.2 9.2  MG 2.0  --  2.1  --   --  1.7  --   PHOS 4.8*  --  4.0  --   --   --   --   < > = values in this interval not displayed. Sepsis Markers  Recent Labs Lab 03/03/16 0959 03/04/16 0441 03/05/16 0500  PROCALCITON <0.10 <0.10 <0.10   ABG  Recent Labs Lab 03/01/16 1410 03/04/16 0930 03/05/16 0900  PHART 7.34* 7.37 7.41  PCO2ART 66* 64* 64*  PO2ART 101 92 81*   Liver Enzymes No results for input(s): AST, ALT, ALKPHOS, BILITOT, ALBUMIN in the last 168 hours. Cardiac Enzymes No results for input(s): TROPONINI, PROBNP in the last 168 hours. Glucose  Recent Labs Lab 03/06/16 0302 03/06/16 0409 03/06/16 0511 03/06/16 0602 03/06/16 0711 03/06/16 0817   GLUCAP 163* 164* 169* 178* 206* 178*    Imaging Dg Chest Port 1 View  03/06/2016  CLINICAL DATA:  Acute respiratory distress EXAM: PORTABLE CHEST 1 VIEW COMPARISON:  03/05/2016 FINDINGS: Endotracheal tube and NG tube again identified. Tip of NG tube not visible due to technical factors. Heart size and vascular pattern normal. Mild opacity left base appears most consistent with atelectasis. IMPRESSION: Mild left base atelectasis Electronically Signed   By: Skipper Cliche M.D.   On: 03/06/2016 07:52    STUDIES:  4/4/ 17 >> CT chest was Negative for acute pulmonary embolism.Mild nodularity in the lung bases, with resolution of nodularopacities observed in November and development of a new 6 mm rightlower lobe nodule. This is most likely inflammatory given the interval changes. 03/06/16 CT Brain without contrast.>> 03/06/16 echo >>  CULTURES: Blood 4/20>> Sputum 4/20>> Urine 4/20>> Pos for Candida Krusei  ANTIBIOTICS: 4/16 levaquin>>4/20  4/20 Zosyn >>4/21 Meropenem 4/21>> Vanc 4/21>>  SIGNIFICANT EVENTS: 4/17> Transferred to ICU  LINES/TUBES: NONE  DISCUSSION:  70 year old female with known history of chronic respiratory failure due to COPD, Sleep apnea, hyperlipidemia, now with admitted with AECOPD/Bronchitis, now with persistent fevers  ASSESSMENT / PLAN  PULMONARY A: Acute on chronic respiratory failure COPD exacerbation with acute bronchiolitis Right lower lobe lung nodule (benign and old) Persistent fevers P:   SBT trials in am as tolerated by the patient with goals of extubation Continue pulmicort/xopnex/ atrovent Continue steroid taper Continue   to meropenem and vanc    CARDIOVASCULAR A:  Shock Hypertension, Sinus Tachycardia Hyperlipidemia P:  Diltiazem PRN Continue Simvastatin Hold hyzaar Cardiology consult  Follow echo   RENAL A:  Acute kidne injury - imrpoving P:  Monitor electrolytes Replace lytes per  protocol  GASTROINTESTINAL A:  No active issues P:  Cont tf's Protonix for GIP  HEMATOLOGIC A:  No active issues P:  lovenox for DVT prophylaxis SCD'S  INFECTIOUS A:Sepsis  Fever Urine positive for Candida P:  - f\u cultures as stated above - abx changed on 4/21  Due to persistent fevers over the past 24 hours ID Consult appreciated. Stated Motrin  ENDOCRINE A:  Diabetese Melitus Hypothyroidism P:  Stop oral hypoglycemics ICU hypo/hyperglycemic protocol continue Insulin gtt Continue synthroid  NEUROLOGIC A:  No active issues P:  Follow CT brain RASS goal:0 -1    Bincy Varughese,AG-ACNP Pulmonary & Critical Care  Pt seen and examined with NP, I have reviewed and formulated the assessment and plan and agree with above.  AECOPD, imaging and lab work no acute change. Will continue current management as above, bipap was increased and will be continue qhs and prn.   Marda Stalker, M.D.  03/06/2016 Critical Care Attestation.  I have personally obtained a  history, examined the patient, evaluated laboratory and imaging results, formulated the assessment and plan and placed orders. The Patient requires high complexity decision making for assessment and support, frequent evaluation and titration of therapies, application of advanced monitoring technologies and extensive interpretation of multiple databases. The patient has critical illness that could lead imminently to failure of 1 or more organ systems and requires the highest level of physician preparedness to intervene.  Critical Care Time 35 min devoted to patient care services described in this note is 35 minutes and is exclusive of time spent in procedures.

## 2016-03-06 NOTE — Consult Note (Addendum)
[facility] Infectious Disease     Reason for Consult: Mold on sputum cx    Referring Physician: Gaye Pollack Date of Admission:  02/27/2016   Active Problems:   Acute respiratory failure (HCC)   HPI: Ebony [name] is a 70 y.o. female with COPD admitted 4/16 with sob, cough.  On admit was in resp failure and required Bipap, wbc was 9 and she was afebrile. Started on levofloxacin. Developed worsening resp failure and transferred to ICU, 4/17, emergently intubated 4/18. Developed fevers started 4/19.  Abx broadened to mero and vanco 4/21.  Trach asp done4/20 is growing mold.  No bacteria isolate. BCX negative. UCX with C krusei.   Of note she had CT chest done 02/15/16 with mild nodularity at lung bases and resolution of nodular opacities from Nov.   Past Medical History  Diagnosis Date  . Hyperlipidemia   . Hypertension   . Diabetes mellitus type 2, uncomplicated ([place])   . Hypothyroidism   . Psoriasis   . Depression   . COPD (chronic obstructive pulmonary disease) ([place])   . Osteoporosis, post-menopausal   . Vitamin D deficiency   . Sleep apnea   . Carpal tunnel syndrome   . Breast cancer ([place])   . Asthma    Past Surgical History  Procedure Laterality Date  . Tubal ligation    . Right heel spur removed    . Carpal tunnel Right 1995  . Thumb arthroscopy Left 2000    release of a nerve to the thumb.  . Breast lumpectomy with needle localization Left 10/06/2015    Procedure: BREAST LUMPECTOMY WITH NEEDLE LOCALIZATION;  Surgeon: Hubbard Robinson, MD;  Location: [facility] ORS;  Service: General;  Laterality: Left;  . Sentinel node biopsy Left 10/06/2015    Procedure: SENTINEL NODE BIOPSY;  Surgeon: Hubbard Robinson, MD;  Location: [facility] ORS;  Service: General;  Laterality: Left;  . Breast excisional biopsy Left 10/06/2015    np for surgery lumpectomy   Social History  Substance Use Topics  . Smoking status: Current Every Day Smoker -- 0.50 packs/day for 30 years    Types:  Cigarettes    Last Attempt to Quit: 10/05/2015  . Smokeless tobacco: Never Used  . Alcohol Use: No   Family History  Problem Relation Age of Onset  . Diabetes Mother   . Thyroid disease Mother   . COPD Father   . Emphysema Father   . Heart disease Brother   . Diabetes Brother   . Heart disease Brother   . Thyroid disease Daughter   . Thyroid disease Son     Allergies:  Allergies  Allergen Reactions  . Other Hives and Rash    Shellfish. Shellfish Pt reports no allergic reaction to iodine or betadine.  . Shellfish Allergy Hives and Rash    Current antibiotics: Antibiotics Given (last 72 hours)    Date/Time Action Medication Dose Rate   03/03/16 2025 Given   vancomycin (VANCOCIN) 1,500 mg in sodium chloride 0.9 % 500 mL IVPB 1,500 mg 250 mL/hr   03/03/16 2133 Given   meropenem (MERREM) 1 g in sodium chloride 0.9 % 100 mL IVPB 1 g 200 mL/hr   03/04/16 0529 Given   meropenem (MERREM) 1 g in sodium chloride 0.9 % 100 mL IVPB 1 g 200 mL/hr   03/04/16 1400 Given   meropenem (MERREM) 1 g in sodium chloride 0.9 % 100 mL IVPB 1 g 200 mL/hr   03/04/16 1749 Given  vancomycin (VANCOCIN) 1,500 mg in sodium chloride 0.9 % 500 mL IVPB 1,500 mg 250 mL/hr   03/04/16 2150 Given   meropenem (MERREM) 1 g in sodium chloride 0.9 % 100 mL IVPB 1 g 200 mL/hr   03/05/16 0532 Given   meropenem (MERREM) 1 g in sodium chloride 0.9 % 100 mL IVPB 1 g 200 mL/hr   03/05/16 1002 Given   vancomycin (VANCOCIN) 1,500 mg in sodium chloride 0.9 % 500 mL IVPB 1,500 mg 250 mL/hr   03/05/16 1341 Given   meropenem (MERREM) 1 g in sodium chloride 0.9 % 100 mL IVPB 1 g 200 mL/hr   03/05/16 2251 Given   meropenem (MERREM) 1 g in sodium chloride 0.9 % 100 mL IVPB 1 g 200 mL/hr   03/06/16 0255 Given   vancomycin (VANCOCIN) 1,500 mg in sodium chloride 0.9 % 500 mL IVPB 1,500 mg 250 mL/hr   03/06/16 0536 Given   meropenem (MERREM) 1 g in sodium chloride 0.9 % 100 mL IVPB 1 g 200 mL/hr      MEDICATIONS: .  antiseptic oral rinse  7 mL Mouth Rinse QID  . aspirin  81 mg Per NG tube Daily  . budesonide (PULMICORT) nebulizer solution  0.25 mg Nebulization 4 times per day  . buPROPion  75 mg Per NG tube BID  . chlorhexidine gluconate (SAGE KIT)  15 mL Mouth Rinse BID  . citalopram  40 mg Per NG tube Daily  . enoxaparin (LOVENOX) injection  40 mg Subcutaneous Q24H  . free water  200 mL Per Tube Q8H  . furosemide  20 mg Intravenous Daily  . letrozole  2.5 mg Oral Daily  . levothyroxine  200 mcg Per NG tube QAC breakfast  . levothyroxine  50 mcg Oral QAC breakfast  . meropenem (MERREM) IV  1 g Intravenous Q8H  . pantoprazole (PROTONIX) IV  40 mg Intravenous Q24H  . polyethylene glycol  17 g Per Tube BID  . potassium chloride  40 mEq Oral Daily  . predniSONE  20 mg Oral Q breakfast   Followed by  . [START ON 03/09/2016] predniSONE  10 mg Oral Q breakfast  . sennosides  5 mL Per Tube Daily  . simvastatin  40 mg Oral QHS  . vancomycin  1,500 mg Intravenous Q12H    Review of Systems - unable to obtain- intubated  OBJECTIVE: Temp:  [100.2 F (37.9 C)-102.8 F (39.3 C)] 101.7 F (38.7 C) (04/24 1400) Pulse Rate:  [63-133] 64 (04/24 1400) Resp:  [22-24] 24 (04/24 1400) BP: (104-180)/(50-79) 107/59 mmHg (04/24 1400) SpO2:  [93 %-100 %] 99 % (04/24 1404) FiO2 (%):  [35 %] 35 % (04/24 1404) Weight:  [102.5 kg (225 lb 15.5 oz)] 102.5 kg (225 lb 15.5 oz) (04/24 0540) Physical Exam  Constitutional: Morbidly obese, intubated, ill appearing  HENT: [place]/AT, PERRLA, no scleral icterus Mouth/Throat: ett Cardiovascular: Normal rate, distant Pulmonary/Chest: Effort normal and breath sounds normal. No respiratory distress. has no wheezes.  Neck = supple, no nuchal rigidity Abdominal: Soft. Bowel sounds are normal. exhibits no distension. There is no tenderness.  Lymphadenopathy: no cervical adenopathy. No axillary adenopathy Neurological opens eye Skin: Skin is warm and dry. No rash noted. No  erythema.  Psychiatric: intubated Access r groin line  LABS: Results for orders placed or performed during the hospital encounter of 02/27/16 (from the past 48 hour(s))  Glucose, capillary     Status: Abnormal   Collection Time: 03/04/16  4:05 PM  Result Value  Ref Range   Glucose-Capillary 149 (H) 65 - 99 mg/dL  Glucose, capillary     Status: Abnormal   Collection Time: 03/04/16  5:42 PM  Result Value Ref Range   Glucose-Capillary 169 (H) 65 - 99 mg/dL  Glucose, capillary     Status: Abnormal   Collection Time: 03/04/16  6:57 PM  Result Value Ref Range   Glucose-Capillary 158 (H) 65 - 99 mg/dL  Glucose, capillary     Status: Abnormal   Collection Time: 03/04/16  8:15 PM  Result Value Ref Range   Glucose-Capillary 147 (H) 65 - 99 mg/dL  Glucose, capillary     Status: Abnormal   Collection Time: 03/04/16  9:22 PM  Result Value Ref Range   Glucose-Capillary 155 (H) 65 - 99 mg/dL  Glucose, capillary     Status: Abnormal   Collection Time: 03/04/16 10:02 PM  Result Value Ref Range   Glucose-Capillary 117 (H) 65 - 99 mg/dL  Glucose, capillary     Status: None   Collection Time: 03/04/16 11:31 PM  Result Value Ref Range   Glucose-Capillary 99 65 - 99 mg/dL  Glucose, capillary     Status: Abnormal   Collection Time: 03/04/16 11:59 PM  Result Value Ref Range   Glucose-Capillary 137 (H) 65 - 99 mg/dL  Glucose, capillary     Status: Abnormal   Collection Time: 03/05/16  1:32 AM  Result Value Ref Range   Glucose-Capillary 147 (H) 65 - 99 mg/dL  Glucose, capillary     Status: Abnormal   Collection Time: 03/05/16  2:04 AM  Result Value Ref Range   Glucose-Capillary 144 (H) 65 - 99 mg/dL  Glucose, capillary     Status: Abnormal   Collection Time: 03/05/16  3:33 AM  Result Value Ref Range   Glucose-Capillary 181 (H) 65 - 99 mg/dL  Glucose, capillary     Status: Abnormal   Collection Time: 03/05/16  4:24 AM  Result Value Ref Range   Glucose-Capillary 178 (H) 65 - 99 mg/dL   Creatinine, serum     Status: None   Collection Time: 03/05/16  5:00 AM  Result Value Ref Range   Creatinine, Ser 0.66 0.44 - 1.00 mg/dL   GFR calc non Af Amer >60 >60 mL/min   GFR calc Af Amer >60 >60 mL/min    Comment: (NOTE) The eGFR has been calculated using the CKD EPI equation. This calculation has not been validated in all clinical situations. eGFR's persistently <60 mL/min signify possible Chronic Kidney Disease.   Procalcitonin     Status: None   Collection Time: 03/05/16  5:00 AM  Result Value Ref Range   Procalcitonin <0.10 ng/mL    Comment:        Interpretation: PCT (Procalcitonin) <= 0.5 ng/mL: Systemic infection (sepsis) is not likely. Local bacterial infection is possible. (NOTE)         ICU PCT Algorithm               Non ICU PCT Algorithm    ----------------------------     ------------------------------         PCT < 0.25 ng/mL                 PCT < 0.1 ng/mL     Stopping of antibiotics            Stopping of antibiotics       strongly encouraged.  strongly encouraged.    ----------------------------     ------------------------------       PCT level decrease by               PCT < 0.25 ng/mL       >= 80% from peak PCT       OR PCT 0.25 - 0.5 ng/mL          Stopping of antibiotics                                             encouraged.     Stopping of antibiotics           encouraged.    ----------------------------     ------------------------------       PCT level decrease by              PCT >= 0.25 ng/mL       < 80% from peak PCT        AND PCT >= 0.5 ng/mL            Continuin g antibiotics                                              encouraged.       Continuing antibiotics            encouraged.    ----------------------------     ------------------------------     PCT level increase compared          PCT > 0.5 ng/mL         with peak PCT AND          PCT >= 0.5 ng/mL             Escalation of antibiotics                                           strongly encouraged.      Escalation of antibiotics        strongly encouraged.   Basic metabolic panel     Status: Abnormal   Collection Time: 03/05/16  5:31 AM  Result Value Ref Range   Sodium 148 (H) 135 - 145 mmol/L   Potassium 4.0 3.5 - 5.1 mmol/L   Chloride 108 101 - 111 mmol/L   CO2 34 (H) 22 - 32 mmol/L   Glucose, Bld 174 (H) 65 - 99 mg/dL   BUN 26 (H) 6 - 20 mg/dL   Creatinine, Ser 0.57 0.44 - 1.00 mg/dL   Calcium 9.2 8.9 - 10.3 mg/dL   GFR calc non Af Amer >60 >60 mL/min   GFR calc Af Amer >60 >60 mL/min    Comment: (NOTE) The eGFR has been calculated using the CKD EPI equation. This calculation has not been validated in all clinical situations. eGFR's persistently <60 mL/min signify possible Chronic Kidney Disease.    Anion gap 6 5 - 15  CBC     Status: Abnormal   Collection Time: 03/05/16  5:31 AM  Result Value Ref Range   WBC 10.6 3.6 - 11.0 K/uL   RBC 3.64 (L) 3.80 - 5.20  MIL/uL   Hemoglobin 10.8 (L) 12.0 - 16.0 g/dL   HCT 33.5 (L) 35.0 - 47.0 %   MCV 92.1 80.0 - 100.0 fL   MCH 29.8 26.0 - 34.0 pg   MCHC 32.3 32.0 - 36.0 g/dL   RDW 13.6 11.5 - 14.5 %   Platelets 191 150 - 440 K/uL  Magnesium     Status: None   Collection Time: 03/05/16  5:31 AM  Result Value Ref Range   Magnesium 1.7 1.7 - 2.4 mg/dL  Glucose, capillary     Status: Abnormal   Collection Time: 03/05/16  5:42 AM  Result Value Ref Range   Glucose-Capillary 165 (H) 65 - 99 mg/dL  Glucose, capillary     Status: Abnormal   Collection Time: 03/05/16  6:28 AM  Result Value Ref Range   Glucose-Capillary 150 (H) 65 - 99 mg/dL  Glucose, capillary     Status: Abnormal   Collection Time: 03/05/16  7:34 AM  Result Value Ref Range   Glucose-Capillary 146 (H) 65 - 99 mg/dL  Blood gas, arterial     Status: Abnormal   Collection Time: 03/05/16  9:00 AM  Result Value Ref Range   FIO2 0.35    Delivery systems VENTILATOR    Mode PRESSURE REGULATED VOLUME CONTROL    VT 450 mL   LHR 24  resp/min   Peep/cpap 5.0 cm H20   pH, Arterial 7.41 7.350 - 7.450   pCO2 arterial 64 (H) 32.0 - 48.0 mmHg   pO2, Arterial 81 (L) 83.0 - 108.0 mmHg   Bicarbonate 39.9 (H) 21.0 - 28.0 mEq/L   Acid-Base Excess 12.9 (H) 0.0 - 3.0 mmol/L   O2 Saturation 95.8 %   Patient temperature 37.2    Collection site RIGHT RADIAL    Sample type ARTERIAL DRAW    Allens test (pass/fail) POSITIVE (A) PASS  Glucose, capillary     Status: Abnormal   Collection Time: 03/05/16  9:06 AM  Result Value Ref Range   Glucose-Capillary 183 (H) 65 - 99 mg/dL  Glucose, capillary     Status: Abnormal   Collection Time: 03/05/16 10:10 AM  Result Value Ref Range   Glucose-Capillary 192 (H) 65 - 99 mg/dL  Glucose, capillary     Status: Abnormal   Collection Time: 03/05/16 11:12 AM  Result Value Ref Range   Glucose-Capillary 188 (H) 65 - 99 mg/dL  Glucose, capillary     Status: Abnormal   Collection Time: 03/05/16 12:06 PM  Result Value Ref Range   Glucose-Capillary 162 (H) 65 - 99 mg/dL  Glucose, capillary     Status: Abnormal   Collection Time: 03/05/16  1:07 PM  Result Value Ref Range   Glucose-Capillary 196 (H) 65 - 99 mg/dL  Glucose, capillary     Status: Abnormal   Collection Time: 03/05/16  2:11 PM  Result Value Ref Range   Glucose-Capillary 149 (H) 65 - 99 mg/dL  Glucose, capillary     Status: Abnormal   Collection Time: 03/05/16  2:54 PM  Result Value Ref Range   Glucose-Capillary 145 (H) 65 - 99 mg/dL  Glucose, capillary     Status: Abnormal   Collection Time: 03/05/16  4:06 PM  Result Value Ref Range   Glucose-Capillary 151 (H) 65 - 99 mg/dL  Glucose, capillary     Status: Abnormal   Collection Time: 03/05/16  4:50 PM  Result Value Ref Range   Glucose-Capillary 124 (H) 65 - 99 mg/dL  Glucose, capillary  Status: Abnormal   Collection Time: 03/05/16  5:52 PM  Result Value Ref Range   Glucose-Capillary 140 (H) 65 - 99 mg/dL  Glucose, capillary     Status: Abnormal   Collection Time:  03/05/16  6:42 PM  Result Value Ref Range   Glucose-Capillary 153 (H) 65 - 99 mg/dL  Glucose, capillary     Status: Abnormal   Collection Time: 03/05/16  7:55 PM  Result Value Ref Range   Glucose-Capillary 135 (H) 65 - 99 mg/dL  Glucose, capillary     Status: Abnormal   Collection Time: 03/05/16  8:54 PM  Result Value Ref Range   Glucose-Capillary 139 (H) 65 - 99 mg/dL  Glucose, capillary     Status: Abnormal   Collection Time: 03/05/16  9:51 PM  Result Value Ref Range   Glucose-Capillary 166 (H) 65 - 99 mg/dL  Glucose, capillary     Status: Abnormal   Collection Time: 03/05/16 11:00 PM  Result Value Ref Range   Glucose-Capillary 137 (H) 65 - 99 mg/dL  Glucose, capillary     Status: Abnormal   Collection Time: 03/06/16 12:02 AM  Result Value Ref Range   Glucose-Capillary 148 (H) 65 - 99 mg/dL  Glucose, capillary     Status: Abnormal   Collection Time: 03/06/16 12:57 AM  Result Value Ref Range   Glucose-Capillary 158 (H) 65 - 99 mg/dL  Glucose, capillary     Status: Abnormal   Collection Time: 03/06/16  1:54 AM  Result Value Ref Range   Glucose-Capillary 170 (H) 65 - 99 mg/dL  Vancomycin, trough     Status: None   Collection Time: 03/06/16  2:24 AM  Result Value Ref Range   Vancomycin Tr 10 10 - 20 ug/mL  Glucose, capillary     Status: Abnormal   Collection Time: 03/06/16  3:02 AM  Result Value Ref Range   Glucose-Capillary 163 (H) 65 - 99 mg/dL  Glucose, capillary     Status: Abnormal   Collection Time: 03/06/16  4:09 AM  Result Value Ref Range   Glucose-Capillary 164 (H) 65 - 99 mg/dL  Glucose, capillary     Status: Abnormal   Collection Time: 03/06/16  5:11 AM  Result Value Ref Range   Glucose-Capillary 169 (H) 65 - 99 mg/dL  Glucose, capillary     Status: Abnormal   Collection Time: 03/06/16  6:02 AM  Result Value Ref Range   Glucose-Capillary 178 (H) 65 - 99 mg/dL  CBC     Status: Abnormal   Collection Time: 03/06/16  6:33 AM  Result Value Ref Range   WBC  13.2 (H) 3.6 - 11.0 K/uL   RBC 3.51 (L) 3.80 - 5.20 MIL/uL   Hemoglobin 10.3 (L) 12.0 - 16.0 g/dL   HCT 32.2 (L) 35.0 - 47.0 %   MCV 91.8 80.0 - 100.0 fL   MCH 29.4 26.0 - 34.0 pg   MCHC 32.1 32.0 - 36.0 g/dL   RDW 13.7 11.5 - 14.5 %   Platelets 210 150 - 440 K/uL  Basic metabolic panel     Status: Abnormal   Collection Time: 03/06/16  6:33 AM  Result Value Ref Range   Sodium 148 (H) 135 - 145 mmol/L   Potassium 4.1 3.5 - 5.1 mmol/L   Chloride 107 101 - 111 mmol/L   CO2 36 (H) 22 - 32 mmol/L   Glucose, Bld 199 (H) 65 - 99 mg/dL   BUN 29 (H) 6 - 20 mg/dL  Creatinine, Ser 0.56 0.44 - 1.00 mg/dL   Calcium 9.2 8.9 - 10.3 mg/dL   GFR calc non Af Amer >60 >60 mL/min   GFR calc Af Amer >60 >60 mL/min    Comment: (NOTE) The eGFR has been calculated using the CKD EPI equation. This calculation has not been validated in all clinical situations. eGFR's persistently <60 mL/min signify possible Chronic Kidney Disease.    Anion gap 5 5 - 15  Magnesium     Status: None   Collection Time: 03/06/16  6:33 AM  Result Value Ref Range   Magnesium 1.7 1.7 - 2.4 mg/dL  Glucose, capillary     Status: Abnormal   Collection Time: 03/06/16  7:11 AM  Result Value Ref Range   Glucose-Capillary 206 (H) 65 - 99 mg/dL  Glucose, capillary     Status: Abnormal   Collection Time: 03/06/16  8:17 AM  Result Value Ref Range   Glucose-Capillary 178 (H) 65 - 99 mg/dL  Glucose, capillary     Status: Abnormal   Collection Time: 03/06/16  9:07 AM  Result Value Ref Range   Glucose-Capillary 178 (H) 65 - 99 mg/dL  Glucose, capillary     Status: Abnormal   Collection Time: 03/06/16 10:16 AM  Result Value Ref Range   Glucose-Capillary 204 (H) 65 - 99 mg/dL  Glucose, capillary     Status: Abnormal   Collection Time: 03/06/16 11:55 AM  Result Value Ref Range   Glucose-Capillary 183 (H) 65 - 99 mg/dL  Glucose, capillary     Status: Abnormal   Collection Time: 03/06/16  1:39 PM  Result Value Ref Range    Glucose-Capillary 191 (H) 65 - 99 mg/dL  Glucose, capillary     Status: Abnormal   Collection Time: 03/06/16  2:42 PM  Result Value Ref Range   Glucose-Capillary 176 (H) 65 - 99 mg/dL   No components found for: ESR, C REACTIVE PROTEIN MICRO: Recent Results (from the past 720 hour(s))  MRSA PCR Screening     Status: None   Collection Time: 02/28/16  4:43 PM  Result Value Ref Range Status   MRSA by PCR NEGATIVE NEGATIVE Final    Comment:        The GeneXpert MRSA Assay (FDA approved for NASAL specimens only), is one component of a comprehensive MRSA colonization surveillance program. It is not intended to diagnose MRSA infection nor to guide or monitor treatment for MRSA infections.   Culture, blood (Routine X 2) w Reflex to ID Panel     Status: None (Preliminary result)   Collection Time: 03/02/16  2:05 PM  Result Value Ref Range Status   Specimen Description BLOOD RIGHT HAND  Final   Special Requests   Final    BOTTLES DRAWN AEROBIC AND ANAEROBIC ANA 2ML AER .5ML   Culture NO GROWTH 3 DAYS  Final   Report Status PENDING  Incomplete  Culture, expectorated sputum-assessment     Status: None   Collection Time: 03/02/16  3:25 PM  Result Value Ref Range Status   Specimen Description TRACHEAL ASPIRATE  Final   Special Requests NONE  Final   Sputum evaluation THIS SPECIMEN IS ACCEPTABLE FOR SPUTUM CULTURE  Final   Report Status 03/02/2016 FINAL  Final  Culture, respiratory (NON-Expectorated)     Status: None (Preliminary result)   Collection Time: 03/02/16  3:25 PM  Result Value Ref Range Status   Specimen Description TRACHEAL ASPIRATE  Final   Special Requests NONE Reflexed from [id number]  Final   Gram Stain MODERATE WBC SEEN FEW HYPHAE   Final   Culture   Final    MODERATE GROWTH MOLD REFERRED TO [facility] IN [place], [place] FOR IDENTIFICATION/CONFIRMATION    Report Status PENDING  Incomplete  Culture, blood (Routine X 2) w Reflex to ID Panel      Status: None (Preliminary result)   Collection Time: 03/02/16  3:34 PM  Result Value Ref Range Status   Specimen Description BLOOD RIGHT HAND  Final   Special Requests BOTTLES DRAWN AEROBIC AND ANAEROBIC   10CC  Final   Culture NO GROWTH 3 DAYS  Final   Report Status PENDING  Incomplete  Urine culture     Status: Abnormal   Collection Time: 03/02/16  5:19 PM  Result Value Ref Range Status   Specimen Description URINE, RANDOM  Final   Special Requests NONE  Final   Culture >=100,000 COLONIES/mL CANDIDA KRUSEI (A)  Final   Report Status 03/05/2016 FINAL  Final    IMAGING: Dg Chest 1 View  02/29/2016  CLINICAL DATA:  Status post intubation today. EXAM: CHEST 1 VIEW COMPARISON:  Single view of the chest 02/27/2016. FINDINGS: New endotracheal tube is in place with the tip in good position just below the clavicular heads. A new NG tube is also identified. The tip of a tube projects over the left lower chest this may be the patient's NG tube. Lungs appear clear. Heart size is normal. No pneumothorax or pleural effusion. IMPRESSION: ET tube in good position. NG tube position is indeterminate. Recommend dedicated plain film of the abdomen. Clear lungs. These results will be called to the ordering clinician or representative by the Radiologist Assistant, and communication documented in the PACS or zVision Dashboard. Electronically Signed   By: Inge Rise M.D.   On: 02/29/2016 20:02   Dg Chest 2 View  02/15/2016  CLINICAL DATA:  Chest pain, back pain, dizziness, headache and shortness of breath. EXAM: CHEST - 2 VIEW COMPARISON:  10/09/2015 FINDINGS: The heart size and mediastinal contours are within normal limits. Bibasilar atelectasis/scarring present with probable component of underlying chronic lung disease. There is no evidence of pulmonary edema, focal consolidation, pneumothorax, nodule or pleural fluid. The visualized skeletal structures are unremarkable. IMPRESSION: Bibasilar  scarring/atelectasis with probable underlying chronic lung disease. Electronically Signed   By: Aletta Edouard M.D.   On: 02/15/2016 19:20   Dg Abd 1 View  02/29/2016  CLINICAL DATA:  NG tube placement.  Femoral line placement. EXAM: ABDOMEN - 1 VIEW COMPARISON:  CT 02/15/2016 FINDINGS: Tip and side port of the enteric tube below the diaphragm in the stomach. No dilated bowel loops to suggest obstruction. Air throughout normal caliber small and large bowel. Tip of the right femoral line projects over the region of the common iliac vein. No evidence of acute osseous abnormality. IMPRESSION: 1. Tip and side port of the enteric tube below the diaphragm in the stomach. 2. Tip of the right femoral catheter projects over the expected location of the proximal right common iliac vein. Electronically Signed   By: Jeb Levering M.D.   On: 02/29/2016 20:01   Ct Angio Chest Pe W/cm &/or Wo Cm  02/15/2016  CLINICAL DATA:  Shortness of breath. Right lower quadrant abdominal pain since this morning. EXAM: CT ANGIOGRAPHY CHEST CT ABDOMEN AND PELVIS WITH CONTRAST TECHNIQUE: Multidetector CT imaging of the chest was performed using the standard protocol during bolus administration of intravenous contrast. Multiplanar CT image  reconstructions and MIPs were obtained to evaluate the vascular anatomy. Multidetector CT imaging of the abdomen and pelvis was performed using the standard protocol during bolus administration of intravenous contrast. CONTRAST:  100 mL Isovue 370 intravenous COMPARISON:  06/02/2013 FINDINGS: CTA CHEST FINDINGS Cardiovascular: There is good opacification of the pulmonary arteries. There is no pulmonary embolism. The thoracic aorta is normal in caliber and intact. Lungs: No confluent consolidation. 6 mm nodule at the lateral aspect of the right lower lobe on axial image 79 series 6. This is new from 10/07/2015 and likely inflammatory. The peripheral opacities in the left lower lobe observed on our prior  study have resolved. There also is a calcified granuloma in the right upper lobe at the level of the carina. Central airways: Patent Effusions: None Lymphadenopathy: None Esophagus: Unremarkable Musculoskeletal: No significant abnormality CT ABDOMEN and PELVIS FINDINGS Hepatobiliary: There are normal appearances of the liver, gallbladder and bile ducts. Pancreas: Normal Spleen: Normal Adrenals/Urinary Tract: Benign appearing thickening of both adrenals, unchanged from 06/02/2013. Multiple 1.5-2.0 cm hypodensities of the kidneys, likely cysts, not significantly changed. Collecting systems, ureters and urinary bladder appear unremarkable. Stomach/Bowel: Stomach and small bowel are unremarkable. Moderate colonic diverticulosis. No evidence of acute diverticulitis or other acute inflammatory process. No bowel obstruction. Oral contrast has progressed through to the cecum. Appendix is normal. Vascular/Lymphatic: The abdominal aorta is normal in caliber. There is mild atherosclerotic calcification. There is no adenopathy in the abdomen or pelvis. Reproductive: Multiple small uterine fibroids. Ovaries are unremarkable. Other: No acute findings are evident in the abdomen or pelvis. There is no ascites. Musculoskeletal: No significant musculoskeletal lesion. Review of the MIP images confirms the above findings. IMPRESSION: 1. Negative for acute pulmonary embolism. 2. Mild nodularity in the lung bases, with resolution of nodular opacities observed in November and development of a new 6 mm right lower lobe nodule. This is most likely inflammatory given the interval changes. 3. Normal appendix. 4. No acute findings are evident in the abdomen or pelvis. 5. Diverticulosis. 6. Multiple small uterine fibroids. Electronically Signed   By: Andreas Newport M.D.   On: 02/15/2016 21:43   Ct Abdomen Pelvis W Contrast  02/15/2016  CLINICAL DATA:  Shortness of breath. Right lower quadrant abdominal pain since this morning. EXAM: CT  ANGIOGRAPHY CHEST CT ABDOMEN AND PELVIS WITH CONTRAST TECHNIQUE: Multidetector CT imaging of the chest was performed using the standard protocol during bolus administration of intravenous contrast. Multiplanar CT image reconstructions and MIPs were obtained to evaluate the vascular anatomy. Multidetector CT imaging of the abdomen and pelvis was performed using the standard protocol during bolus administration of intravenous contrast. CONTRAST:  100 mL Isovue 370 intravenous COMPARISON:  06/02/2013 FINDINGS: CTA CHEST FINDINGS Cardiovascular: There is good opacification of the pulmonary arteries. There is no pulmonary embolism. The thoracic aorta is normal in caliber and intact. Lungs: No confluent consolidation. 6 mm nodule at the lateral aspect of the right lower lobe on axial image 79 series 6. This is new from 10/07/2015 and likely inflammatory. The peripheral opacities in the left lower lobe observed on our prior study have resolved. There also is a calcified granuloma in the right upper lobe at the level of the carina. Central airways: Patent Effusions: None Lymphadenopathy: None Esophagus: Unremarkable Musculoskeletal: No significant abnormality CT ABDOMEN and PELVIS FINDINGS Hepatobiliary: There are normal appearances of the liver, gallbladder and bile ducts. Pancreas: Normal Spleen: Normal Adrenals/Urinary Tract: Benign appearing thickening of both adrenals, unchanged from 06/02/2013. Multiple 1.5-2.0 cm hypodensities  of the kidneys, likely cysts, not significantly changed. Collecting systems, ureters and urinary bladder appear unremarkable. Stomach/Bowel: Stomach and small bowel are unremarkable. Moderate colonic diverticulosis. No evidence of acute diverticulitis or other acute inflammatory process. No bowel obstruction. Oral contrast has progressed through to the cecum. Appendix is normal. Vascular/Lymphatic: The abdominal aorta is normal in caliber. There is mild atherosclerotic calcification. There is  no adenopathy in the abdomen or pelvis. Reproductive: Multiple small uterine fibroids. Ovaries are unremarkable. Other: No acute findings are evident in the abdomen or pelvis. There is no ascites. Musculoskeletal: No significant musculoskeletal lesion. Review of the MIP images confirms the above findings. IMPRESSION: 1. Negative for acute pulmonary embolism. 2. Mild nodularity in the lung bases, with resolution of nodular opacities observed in November and development of a new 6 mm right lower lobe nodule. This is most likely inflammatory given the interval changes. 3. Normal appendix. 4. No acute findings are evident in the abdomen or pelvis. 5. Diverticulosis. 6. Multiple small uterine fibroids. Electronically Signed   By: Andreas Newport M.D.   On: 02/15/2016 21:43   Dg Chest Port 1 View  03/06/2016  CLINICAL DATA:  Acute respiratory distress EXAM: PORTABLE CHEST 1 VIEW COMPARISON:  03/05/2016 FINDINGS: Endotracheal tube and NG tube again identified. Tip of NG tube not visible due to technical factors. Heart size and vascular pattern normal. Mild opacity left base appears most consistent with atelectasis. IMPRESSION: Mild left base atelectasis Electronically Signed   By: Skipper Cliche M.D.   On: 03/06/2016 07:52   Dg Chest Port 1 View  03/05/2016  CLINICAL DATA:  Acute respiratory failure. Worsening shortness of breath. COPD. EXAM: PORTABLE CHEST 1 VIEW COMPARISON:  03/04/2016 FINDINGS: Endotracheal tube and nasogastric tube remain in appropriate position. The heart size and mediastinal contours are within normal limits. Both lungs are clear. The visualized skeletal structures are unremarkable. IMPRESSION: Endotracheal tube and nasogastric tube in appropriate position. No active disease. Electronically Signed   By: Earle Gell M.D.   On: 03/05/2016 10:08   Dg Chest Port 1 View  03/04/2016  CLINICAL DATA:  Acute respiratory failure.  Subsequent encounter. EXAM: PORTABLE CHEST 1 VIEW COMPARISON:   03/02/2016 FINDINGS: Lungs mildly hyperexpanded. No evidence of pneumonia or pulmonary edema. No convincing pleural effusion and no pneumothorax. Endotracheal tube and oral/ nasogastric tube are stable in well positioned. IMPRESSION: 1. No acute findings in the lungs. 2. Well-positioned support apparatus. 3. No change from the previous day's study. Electronically Signed   By: Lajean Manes M.D.   On: 03/04/2016 07:48   Dg Chest Port 1 View  03/02/2016  CLINICAL DATA:  Acute respiratory failure.  Subsequent encounter. EXAM: PORTABLE CHEST 1 VIEW COMPARISON:  02/29/2016 FINDINGS: Cardiac silhouette is normal in size. Normal mediastinal and hilar contours. Lungs are hyperexpanded. Small stable granuloma noted in the right mid lung. Lungs otherwise clear. No pleural effusion. No pneumothorax. Endotracheal tube is stable in well positioned. Nasogastric tube passes below the diaphragm into the proximal to mid stomach. IMPRESSION: 1. No acute cardiopulmonary disease. 2. Support apparatus is well positioned. Electronically Signed   By: Lajean Manes M.D.   On: 03/02/2016 15:05   Dg Chest Portable 1 View  02/27/2016  CLINICAL DATA:  Respiratory distress since yesterday. Audible wheezing. Dry cough. COPD. Smoker. EXAM: PORTABLE CHEST 1 VIEW COMPARISON:  Radiographs and CT dated 02/15/2016. FINDINGS: Stable right upper lobe calcified granuloma. The 6 mm nodule seen in the right lower lobe on the CT is not  visible on the current or previous radiographs. The cardiac silhouette is currently borderline enlarged with a mild increase in size. The upper lung zone pulmonary vasculature remains mildly prominent. Stable mild prominence of the interstitial markings. The lungs are hyperexpanded. Diffuse osteopenia. IMPRESSION: 1. Interval borderline cardiomegaly. 2. Stable mild pulmonary vascular congestion and changes of COPD. Electronically Signed   By: Claudie Revering M.D.   On: 02/27/2016 07:47    Assessment:   Ebony [name]  is a 70 y.o. female with COPD, morbid obesity, admitted with respiratory failure, initally afebrile adn with nml wbc but now with recurrent fevers, currently intubated. Has been on meropenem and vanco with bcx neg, sputum cx with mold and fungal cx with candida krusei.  She has a R fem TLC in place since 4/18. Producing lots of thick sputum CXR does not show dense infiltrate. I do not think the mold is likely the cause of her resp compromise and fevers so would continue to pursue other sources.  Recommendations Remove R fem TLC as possible source of infection Given candiduria would change foley cath Will review the mold with micro- start voriconazole for now  Thank you very much for allowing me to participate in the care of this patient. Please call with questions.   Cheral Marker. Ola Spurr, MD

## 2016-03-06 NOTE — Progress Notes (Signed)
Nutrition Follow-up  DOCUMENTATION CODES:   Morbid obesity  INTERVENTION:  -Recommend continuing current TF regimen at present -Discussed current IVF, hypernatremia, TF/free water flush during ICU rounds; MD Ashby Dawes gave orders to change IVF to 1/2 NS at 50 ml/hr -Pt without BM since 02/28/16 (7 days); pt currently has senokot and dulcolax suppository ordered prn. Recommend further intervention regarding bowel regimen. Discussed during ICU rounds  NUTRITION DIAGNOSIS:   Inadequate oral intake related to acute illness as evidenced by NPO status.  Being addressed via TF  GOAL:   Provide needs based on ASPEN/SCCM guidelines  MONITOR:   Vent status, TF tolerance, Labs, Weight trends, I & O's  REASON FOR ASSESSMENT:   Consult, Ventilator Enteral/tube feeding initiation and management  ASSESSMENT:    70 yo female admitted with acute respiratory failure duw to acute on chronic COPD exacerbation with associated acute bronchitis, initially on Bipap, required intubation on 02/29/16   Noted pt with mold in sputum, I/D consulted. Pt remains febrile.  Patient is currently intubated on ventilator support MV: 10.9 L/min Temp (24hrs), Avg:101.3 F (38.5 C), Min:100 F (37.8 C), Max:102.8 F (39.3 C)   Diet Order:  Diet NPO time specified   EN: tolerating Vital High Protein at rate of 60 ml/hr  Digestive System: abdomen obese, BS hypoactive, constipated  Skin:  Reviewed, no issues  Last BM:  02/28/16   Labs: sodium 148  Meds: NS at 100 ml/hr, insulin drip  Height:   Ht Readings from Last 1 Encounters:  03/02/16 5\' 3"  (1.6 m)    Weight:   Wt Readings from Last 1 Encounters:  03/06/16 225 lb 15.5 oz (102.5 kg)   Filed Weights   03/03/16 0545 03/05/16 0446 03/06/16 0540  Weight: 212 lb 15.4 oz (96.6 kg) 237 lb 10.5 oz (107.8 kg) 225 lb 15.5 oz (102.5 kg)    Ideal Body Weight:  52.3 kg  BMI:  Body mass index is 40.04 kg/(m^2).  Estimated Nutritional Needs:    Kcal:  [id number] kcals (11-14 kcals/kg current wt of 102.5 kg for BMI >30 using ASPEN guidelines)  Protein:  >/= 104 g (2.0 g/kg) IBW  Fluid:  >/= 1.5 L  EDUCATION NEEDS:   Education needs no appropriate at this time   [place], [place], [place] [phone] Pager  [phone] Weekend/On-Call Pager

## 2016-03-06 NOTE — Progress Notes (Signed)
Pt continues on fentanyl gtt now at 100 mcg. Temp coming down but still high. Face red and ruddy, Tylenol given this AM as well. No SVT reported this shift. Continue on glucostablizer with insulin gtt. Last FBS was 178. Foley patent with good urinary output. Not following commands but opens eyes to touch. Has not had a BM since 4/17, senokot syrup given last pm, and has order for bisacodyl supp prn.

## 2016-03-06 NOTE — Progress Notes (Deleted)
Informed by RN re: removal of right fem line due to persistent fevers. I had placed the line there as pt is challenging in terms of vascular access (pt had failed attempts at right IJ, right subclavian, but guide wire could not be advanced after multiple attempts). If another femoral line can not be placed, will consult IV team for placement of  picc line.   Ashby Dawes, MD.  03/06/2016

## 2016-03-07 ENCOUNTER — Inpatient Hospital Stay: Payer: Medicare Other

## 2016-03-07 ENCOUNTER — Inpatient Hospital Stay
Admit: 2016-03-07 | Discharge: 2016-03-07 | Disposition: A | Discharge status: Home / Self Care | Payer: Medicare Other | Attending: Pulmonary Disease | Admitting: Pulmonary Disease

## 2016-03-07 DIAGNOSIS — Z8619 Personal history of other infectious and parasitic diseases: Secondary | ICD-10-CM | POA: Insufficient documentation

## 2016-03-07 LAB — GLUCOSE, CAPILLARY
GLUCOSE-CAPILLARY: 123 mg/dL — AB (ref 65–99)
GLUCOSE-CAPILLARY: 134 mg/dL — AB (ref 65–99)
GLUCOSE-CAPILLARY: 147 mg/dL — AB (ref 65–99)
GLUCOSE-CAPILLARY: 160 mg/dL — AB (ref 65–99)
GLUCOSE-CAPILLARY: 168 mg/dL — AB (ref 65–99)
GLUCOSE-CAPILLARY: 178 mg/dL — AB (ref 65–99)
GLUCOSE-CAPILLARY: 184 mg/dL — AB (ref 65–99)
GLUCOSE-CAPILLARY: 200 mg/dL — AB (ref 65–99)
GLUCOSE-CAPILLARY: 210 mg/dL — AB (ref 65–99)
Glucose-Capillary: 119 mg/dL — ABNORMAL HIGH (ref 65–99)
Glucose-Capillary: 129 mg/dL — ABNORMAL HIGH (ref 65–99)
Glucose-Capillary: 131 mg/dL — ABNORMAL HIGH (ref 65–99)
Glucose-Capillary: 132 mg/dL — ABNORMAL HIGH (ref 65–99)
Glucose-Capillary: 140 mg/dL — ABNORMAL HIGH (ref 65–99)
Glucose-Capillary: 150 mg/dL — ABNORMAL HIGH (ref 65–99)
Glucose-Capillary: 156 mg/dL — ABNORMAL HIGH (ref 65–99)
Glucose-Capillary: 158 mg/dL — ABNORMAL HIGH (ref 65–99)
Glucose-Capillary: 158 mg/dL — ABNORMAL HIGH (ref 65–99)
Glucose-Capillary: 159 mg/dL — ABNORMAL HIGH (ref 65–99)
Glucose-Capillary: 163 mg/dL — ABNORMAL HIGH (ref 65–99)
Glucose-Capillary: 168 mg/dL — ABNORMAL HIGH (ref 65–99)
Glucose-Capillary: 170 mg/dL — ABNORMAL HIGH (ref 65–99)
Glucose-Capillary: 170 mg/dL — ABNORMAL HIGH (ref 65–99)
Glucose-Capillary: 176 mg/dL — ABNORMAL HIGH (ref 65–99)
Glucose-Capillary: 184 mg/dL — ABNORMAL HIGH (ref 65–99)
Glucose-Capillary: 201 mg/dL — ABNORMAL HIGH (ref 65–99)
Glucose-Capillary: 216 mg/dL — ABNORMAL HIGH (ref 65–99)

## 2016-03-07 LAB — BASIC METABOLIC PANEL
ANION GAP: 4 — AB (ref 5–15)
BUN: 41 mg/dL — AB (ref 6–20)
CALCIUM: 9.3 mg/dL (ref 8.9–10.3)
CO2: 36 mmol/L — AB (ref 22–32)
Chloride: 108 mmol/L (ref 101–111)
Creatinine, Ser: 0.7 mg/dL (ref 0.44–1.00)
GFR calc Af Amer: >60 mL/min (ref >60)
GFR calc non Af Amer: >60 mL/min (ref >60)
GLUCOSE: 170 mg/dL — AB (ref 65–99)
POTASSIUM: 4 mmol/L (ref 3.5–5.1)
Sodium: 148 mmol/L — ABNORMAL HIGH (ref 135–145)

## 2016-03-07 LAB — CULTURE, BLOOD (ROUTINE X 2)
CULTURE: NO GROWTH
CULTURE: NO GROWTH

## 2016-03-07 LAB — CBC
HEMATOCRIT: 32.1 % — AB (ref 35.0–47.0)
Hemoglobin: 10.4 g/dL — ABNORMAL LOW (ref 12.0–16.0)
MCH: 29.8 pg (ref 26.0–34.0)
MCHC: 32.3 g/dL (ref 32.0–36.0)
MCV: 92.4 fL (ref 80.0–100.0)
Platelets: 228 10*3/uL (ref 150–440)
RBC: 3.47 MIL/uL — ABNORMAL LOW (ref 3.80–5.20)
RDW: 13.3 % (ref 11.5–14.5)
WBC: 15.3 10*3/uL — AB (ref 3.6–11.0)

## 2016-03-07 LAB — EXPECTORATED SPUTUM ASSESSMENT W REFEX TO RESP CULTURE

## 2016-03-07 LAB — EXPECTORATED SPUTUM ASSESSMENT W GRAM STAIN, RFLX TO RESP C

## 2016-03-07 MED ORDER — MIDAZOLAM HCL 2 MG/2ML IJ SOLN
8.0000 mg | Freq: Once | INTRAMUSCULAR | Status: AC
  Administered 2016-03-07: 8 mg via INTRAVENOUS
  Filled 2016-03-07: qty 8

## 2016-03-07 MED ORDER — CITALOPRAM HYDROBROMIDE 20 MG PO TABS
20.0000 mg | ORAL_TABLET | Freq: Every day | ORAL | Status: DC
  Administered 2016-03-08 – 2016-03-13 (×6): 20 mg via NASOGASTRIC
  Filled 2016-03-07 (×6): qty 1

## 2016-03-07 MED ORDER — PROPOFOL 10 MG/ML IV BOLUS
50.0000 mg | Freq: Once | INTRAVENOUS | Status: AC
  Administered 2016-03-07: 50 mg via INTRAVENOUS

## 2016-03-07 MED ORDER — PROPOFOL 1000 MG/100ML IV EMUL
INTRAVENOUS | Status: AC
  Administered 2016-03-07: 50 mg via INTRAVENOUS
  Filled 2016-03-07: qty 100

## 2016-03-07 MED ORDER — FAMOTIDINE 40 MG/5ML PO SUSR
20.0000 mg | Freq: Two times a day (BID) | ORAL | Status: DC
  Administered 2016-03-07 – 2016-03-13 (×14): 20 mg via ORAL
  Filled 2016-03-07 (×17): qty 2.5

## 2016-03-07 NOTE — Progress Notes (Signed)
*  PRELIMINARY RESULTS* Echocardiogram 2D Echocardiogram has been performed.  Laqueta Jean Hege 03/07/2016, 2:23 PM

## 2016-03-07 NOTE — Progress Notes (Signed)
[facility] PROGRESS NOTE Date of Admission:  02/27/2016     ID: Ebony [name] is a 70 y.o. female with sepsis, mold on sputum  Active Problems:   Acute respiratory failure (HCC)  Subjective: TLC removed from groin, foley changed  ROS  Unable to obtain  Medications:  Antibiotics Given (last 72 hours)    Date/Time Action Medication Dose Rate   03/04/16 1749 Given   vancomycin (VANCOCIN) 1,500 mg in sodium chloride 0.9 % 500 mL IVPB 1,500 mg 250 mL/hr   03/04/16 2150 Given   meropenem (MERREM) 1 g in sodium chloride 0.9 % 100 mL IVPB 1 g 200 mL/hr   03/05/16 0532 Given   meropenem (MERREM) 1 g in sodium chloride 0.9 % 100 mL IVPB 1 g 200 mL/hr   03/05/16 1002 Given   vancomycin (VANCOCIN) 1,500 mg in sodium chloride 0.9 % 500 mL IVPB 1,500 mg 250 mL/hr   03/05/16 1341 Given   meropenem (MERREM) 1 g in sodium chloride 0.9 % 100 mL IVPB 1 g 200 mL/hr   03/05/16 2251 Given   meropenem (MERREM) 1 g in sodium chloride 0.9 % 100 mL IVPB 1 g 200 mL/hr   03/06/16 0255 Given   vancomycin (VANCOCIN) 1,500 mg in sodium chloride 0.9 % 500 mL IVPB 1,500 mg 250 mL/hr   03/06/16 0536 Given   meropenem (MERREM) 1 g in sodium chloride 0.9 % 100 mL IVPB 1 g 200 mL/hr   03/06/16 1537 Given   meropenem (MERREM) 1 g in sodium chloride 0.9 % 100 mL IVPB 1 g 200 mL/hr   03/06/16 1712 Given   vancomycin (VANCOCIN) 1,500 mg in sodium chloride 0.9 % 500 mL IVPB 1,500 mg 250 mL/hr   03/06/16 2121 Given   meropenem (MERREM) 1 g in sodium chloride 0.9 % 100 mL IVPB 1 g 200 mL/hr   03/07/16 0305 Given   vancomycin (VANCOCIN) 1,500 mg in sodium chloride 0.9 % 500 mL IVPB 1,500 mg 250 mL/hr   03/07/16 0643 Given   meropenem (MERREM) 1 g in sodium chloride 0.9 % 100 mL IVPB 1 g 200 mL/hr     . antiseptic oral rinse  7 mL Mouth Rinse QID  . aspirin  81 mg Per NG tube Daily  . budesonide (PULMICORT) nebulizer solution  0.25 mg Nebulization 4 times per day  . buPROPion  75 mg Per NG  tube BID  . chlorhexidine gluconate (SAGE KIT)  15 mL Mouth Rinse BID  . citalopram  40 mg Per NG tube Daily  . enoxaparin (LOVENOX) injection  40 mg Subcutaneous Q24H  . famotidine  20 mg Oral BID  . free water  200 mL Per Tube Q8H  . furosemide  20 mg Intravenous Daily  . letrozole  2.5 mg Oral Daily  . levothyroxine  200 mcg Per NG tube QAC breakfast  . levothyroxine  50 mcg Oral QAC breakfast  . meropenem (MERREM) IV  1 g Intravenous Q8H  . polyethylene glycol  17 g Per Tube BID  . potassium chloride  40 mEq Oral Daily  . [START ON 03/09/2016] predniSONE  10 mg Oral Q breakfast  . sennosides  5 mL Per Tube Daily  . vancomycin  1,500 mg Intravenous Q12H  . voriconazole  4 mg/kg (Adjusted) Intravenous Q12H    Objective: Vital signs in last 24 hours: Temp:  [100.8 F (38.2 C)-102.2 F (39 C)] 100.9 F (38.3 C) (04/25 1200) Pulse Rate:  [55-107] 107 (04/25  1200) Resp:  [10-24] 10 (04/25 1200) BP: (86-167)/(45-74) 139/74 mmHg (04/25 1200) SpO2:  [93 %-99 %] 99 % (04/25 1205) FiO2 (%):  [35 %] 35 % (04/25 1205) Weight:  [102.4 kg (225 lb 12 oz)] 102.4 kg (225 lb 12 oz) (04/25 0503) Physical Exam  Constitutional:  Morbidly obese, intubated, ill appearing   HENT: [place]/AT, PERRLA, no scleral icterus Mouth/Throat: ett Cardiovascular: Normal rate, distant Pulmonary/Chest: Effort normal and breath sounds normal. No respiratory distress.  has no wheezes.  Neck = supple, no nuchal rigidity Abdominal: Soft. Bowel sounds are normal.  exhibits no distension. There is no tenderness.  Lymphadenopathy: no cervical adenopathy. No axillary adenopathy Neurological opens eye Skin: Skin is warm and dry. No rash noted. No erythema.  Psychiatric: intubated Access- R picc .    Lab Results  Recent Labs  03/06/16 0633 03/07/16 0509  WBC 13.2* 15.3*  HGB 10.3* 10.4*  HCT 32.2* 32.1*  NA 148* 148*  K 4.1 4.0  CL 107 108  CO2 36* 36*  BUN 29* 41*  CREATININE 0.56 0.70     Microbiology: Results for orders placed or performed during the hospital encounter of 02/27/16  MRSA PCR Screening     Status: None   Collection Time: 02/28/16  4:43 PM  Result Value Ref Range Status   MRSA by PCR NEGATIVE NEGATIVE Final    Comment:        The GeneXpert MRSA Assay (FDA approved for NASAL specimens only), is one component of a comprehensive MRSA colonization surveillance program. It is not intended to diagnose MRSA infection nor to guide or monitor treatment for MRSA infections.   Culture, blood (Routine X 2) w Reflex to ID Panel     Status: None   Collection Time: 03/02/16  2:05 PM  Result Value Ref Range Status   Specimen Description BLOOD RIGHT HAND  Final   Special Requests   Final    BOTTLES DRAWN AEROBIC AND ANAEROBIC ANA 2ML AER .5ML   Culture NO GROWTH 5 DAYS  Final   Report Status 03/07/2016 FINAL  Final  Culture, expectorated sputum-assessment     Status: None   Collection Time: 03/02/16  3:25 PM  Result Value Ref Range Status   Specimen Description TRACHEAL ASPIRATE  Final   Special Requests NONE  Final   Sputum evaluation THIS SPECIMEN IS ACCEPTABLE FOR SPUTUM CULTURE  Final   Report Status 03/02/2016 FINAL  Final  Culture, respiratory (NON-Expectorated)     Status: None (Preliminary result)   Collection Time: 03/02/16  3:25 PM  Result Value Ref Range Status   Specimen Description TRACHEAL ASPIRATE  Final   Special Requests NONE Reflexed from [id number]  Final   Gram Stain MODERATE WBC SEEN FEW HYPHAE   Final   Culture   Final    MODERATE GROWTH MOLD REFERRED TO [facility] IN [place], [place] IDENTIFICATION/CONFIRMATION    Report Status PENDING  Incomplete  Culture, blood (Routine X 2) w Reflex to ID Panel     Status: None   Collection Time: 03/02/16  3:34 PM  Result Value Ref Range Status   Specimen Description BLOOD RIGHT HAND  Final   Special Requests BOTTLES DRAWN AEROBIC AND ANAEROBIC   10CC  Final    Culture NO GROWTH 5 DAYS  Final   Report Status 03/07/2016 FINAL  Final  Urine culture     Status: Abnormal   Collection Time: 03/02/16  5:19 PM  Result Value Ref Range  Status   Specimen Description URINE, RANDOM  Final   Special Requests NONE  Final   Culture >=100,000 COLONIES/mL CANDIDA KRUSEI (A)  Final   Report Status 03/05/2016 FINAL  Final  Culture, expectorated sputum-assessment     Status: None   Collection Time: 03/07/16 12:14 PM  Result Value Ref Range Status   Specimen Description BRONCHIAL ALVEOLAR LAVAGE  Final   Special Requests NONE  Final   Sputum evaluation THIS SPECIMEN IS ACCEPTABLE FOR SPUTUM CULTURE  Final   Report Status 03/07/2016 FINAL  Final    Studies/Results: Dg Chest 1 View  03/07/2016  CLINICAL DATA:  Acute respiratory failure, history of COPD, breast malignancy EXAM: CHEST 1 VIEW COMPARISON:  Portable chest x-ray of March 06, 2016 FINDINGS: The lungs are adequately inflated. There are patchy areas of confluent density in both lungs more conspicuous today. The cardiac silhouette is normal in size. The pulmonary vascularity is not engorged. There is no pleural effusion. The mediastinum is normal in width. The right-sided PICC line tip projects over the proximal third of the SVC. IMPRESSION: Patchy alveolar opacities bilaterally slightly more conspicuous today worrisome for pneumonia. Density at the left lung base medially has improved slightly. There is no evidence of CHF or significant pleural effusion. Electronically Signed   By: Aki Burdin  [place] M.D.   On: 03/07/2016 07:33   Ct Head Wo Contrast  03/06/2016  CLINICAL DATA:  Difficulty speaking EXAM: CT HEAD WITHOUT CONTRAST TECHNIQUE: Contiguous axial images were obtained from the base of the skull through the vertex without intravenous contrast. COMPARISON:  None. FINDINGS: Bony calvarium is intact. No findings to suggest acute hemorrhage, acute infarction or space-occupying mass lesion are noted. IMPRESSION: No  acute abnormality noted. Electronically Signed   By: Inez Catalina M.D.   On: 03/06/2016 15:12   Dg Chest Port 1 View  03/06/2016  CLINICAL DATA:  PICC line placement. EXAM: PORTABLE CHEST 1 VIEW COMPARISON:  Earlier the same date and 03/05/2016. FINDINGS: 1839 hours. Interval right arm PICC placement, tip extending to the mid SVC level. The endotracheal and nasogastric tubes appear unchanged. The heart size and mediastinal contours are stable. There is interval improved aeration of the lung bases. No pneumothorax or significant pleural effusion seen. IMPRESSION: Right arm PICC extends to the mid SVC level. Interval mildly improved basilar aeration. Electronically Signed   By: Richardean Sale M.D.   On: 03/06/2016 19:01   Dg Chest Port 1 View  03/06/2016  CLINICAL DATA:  Acute respiratory distress EXAM: PORTABLE CHEST 1 VIEW COMPARISON:  03/05/2016 FINDINGS: Endotracheal tube and NG tube again identified. Tip of NG tube not visible due to technical factors. Heart size and vascular pattern normal. Mild opacity left base appears most consistent with atelectasis. IMPRESSION: Mild left base atelectasis Electronically Signed   By: Skipper Cliche M.D.   On: 03/06/2016 07:52    Assessment/Plan: Ebony [name] is a 70 y.o. female with COPD, morbid obesity, admitted with respiratory failure, initally afebrile adn with nml wbc but now with recurrent fevers, currently intubated. Has been on meropenem and vanco with bcx neg, sputum cx with mold and fungal cx with candida krusei.  She has a R fem TLC in place since 4/18. Producing lots of thick sputum CXR does not show dense infiltrate. I do not think the mold is likely the cause of her resp compromise and fevers so would continue to pursue other sources. 4/25- still febrile. Wbc up to 15.   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 Will follow with you.  Bellanie Matthew P   03/07/2016, 2:02 PM

## 2016-03-07 NOTE — Procedures (Signed)
  [facility]            Bronchoscopy Note   FINDINGS/SUMMARY:   -Copious mucosal secretions, throughout both lungs, copious mucosal erythema and edema throughout both lungs. -Washings taken from the right lower lobe sent for culture, fungal culture, AFB. -Severe candidal bronchitis seen throughout both airways. -Tracheobronchomalacia.  Indication: Copious secretions, elevated peak pressure on ventilator, persistent fevers. The patient (or their representative) was informed of the risks (including but not limited to bleeding, infection, respiratory failure, lung injury, tooth/oral injury) and benefits of the procedure and gave consent, see chart.   Pre-op diagnosis: Pneumonia, fevers. Post-op diagnosis: Candidal bronchitis. Estimated blood loss: Minimal  Medications for procedure: Fentanyl 150 g, Versed 8 mg, propofol 50 mg.  Procedure description: After obtaining informed consent, a timeout was called to confirm the patient and the procedure. The bronchoscope was introduced via the endotracheal tube, there was moderate secretions throughout the endotracheal tube. Subsequently, an anatomical tour was undertaken, all segments were visualized, there are no anatomical abnormalities. There was a mucosal secretions diffusely throughout both lungs, which were suctioned without difficulty. There was candidal bronchitis seen throughout both lungs. The bronchoscope was wedged in the right lower lobe, washings were taken 2 and sent for cultures. The bronchoscope was then removed. There was significant tracheobronchomalacia, likely explaining the patient's elevated peak pressures on the ventilator.    Condition post procedure: Stable/unchanged   Complications: None   Deep Ashby Dawes, MD.  Board Certified in Internal Medicine, Pulmonary Medicine, [place], and Sleep Medicine.  [facility] Office Number: [phone]  Patricia Pesa,  M.D.  Vilinda Boehringer, M.D.  Cheral Marker, M.D  03/07/2016

## 2016-03-07 NOTE — Progress Notes (Signed)
Pharmacy Antimicrobial Note  Ebony [name] is a 70 y.o. female admitted on 02/27/2016 with respiratory failure.  Pharmacy has been consulted for vancomycin, meropenem, and voriconazole dosing.  This is day #10 of antimicrobial therapy. Patient is currently on meropenem (day #5), vancomycin (day #5), and voriconazole (day #2). ID has been consulted and is following.   Plan: Continue vancomycin 1500 mg IV q12h Goal vancomycin trough is 15-20 mcg/mL Vancomycin trough ordered for 4/26 @ 1430, which is prior to the 5th dose and should represent steady state.   Continue meropenem 1 g IV q8h  Continue current orders for voriconazole. Patient already received the two loading doses of 6 mg/kg (430 mg). Continue with current orders for 4 mg/kg (290 mg) IV q12h. Dosed using adjusted body weight since patient is obese.  Per discussion with MD, simvastatin was discontinued and citalopram dose was reduced to 20 mg due to drug-drug interactions with voriconazole.  Height: 5\' 3"  (160 cm) Weight: 225 lb 12 oz (102.4 kg) IBW/kg (Calculated) : 52.4  Temp (24hrs), Avg:101.2 F (38.4 C), Min:100.8 F (38.2 C), Max:102.2 F (39 C)   Recent Labs Lab 03/03/16 0959 03/04/16 0441 03/05/16 0500 03/05/16 0531 03/06/16 0224 03/06/16 0633 03/07/16 0509  WBC 14.4* 13.2*  --  10.6  --  13.2* 15.3*  CREATININE 0.89 0.62 0.66 0.57  --  0.56 0.70  VANCOTROUGH  --   --   --   --  10  --   --     Estimated Creatinine Clearance: 78 mL/min (by C-G formula based on Cr of 0.7).    Allergies  Allergen Reactions  . Other Hives and Rash    Shellfish. Shellfish Pt reports no allergic reaction to iodine or betadine.  . Shellfish Allergy Hives and Rash   Antimicrobials this admission: levofloxacin 4/16 >> 4/20 meropenem 4/21 >>  Vancomycin 4/21 >> Voriconazole 4/24 >>  Dose adjustments this admission: 4/24 Vancomycin changed from 1500 mg IV q8h to q12h  Microbiology results: 4/20 BCx: No growth to  date 4/20 UCx: Candida krusei  4/17 MRSA PCR: negative  Thank you for allowing pharmacy to be a part of this patient's care.  Lenis Noon, PharmD Clinical Pharmacist 03/07/2016 2:49 PM

## 2016-03-07 NOTE — Progress Notes (Signed)
Inpatient Diabetes Program Recommendations  AACE/ADA: New Consensus Statement on Inpatient Glycemic Control (2015)  Target Ranges:  Prepandial:   less than 140 mg/dL      Peak postprandial:   less than 180 mg/dL (1-2 hours)      Critically ill patients:  140 - 180 mg/dL   Results for Ebony [name], BURMASTER (MRN [id number]) as of 03/07/2016 10:16  Ref. Range 03/06/2016 23:11 03/06/2016 23:58 03/07/2016 04:15 03/07/2016 06:05 03/07/2016 07:04 03/07/2016 07:25 03/07/2016 08:16  Glucose-Capillary Latest Ref Range: 65-99 mg/dL 126 (H) 131 (H) 216 (H) 163 (H) 134 (H) 132 (H) 158 (H)   Review of Glycemic Control   Current orders for Inpatient glycemic control: Novolin R insulin drip per ICU Glycemic Control order set - Phase 2  Inpatient Diabetes Program Recommendations: ICU Glycemic Control order set: Patient continues on IV insulin per Phase 2 of ICU Glycemic Control order set. Once criteria is met for transition to Phase 3 of ICU Glycemic Control order set, RN can initiate Phase 3 per protocol.   Per protocol; RN can initiate Phase III (Transition ICU Glycemic Control Order Set) when all of the following are met:  the insulin infusion rate is < 4 units / hour  tube feeds are at goal rate and stable   there are 6 subsequent CBG readings < 180 mg/dL  Thanks, Barnie Alderman, RN, MSN, CDE Diabetes Coordinator Inpatient Diabetes Program [phone] (Team Pager from [place] to [place]) [phone] ([facility] office) [phone] [facility] office) [phone] [facility] office)

## 2016-03-07 NOTE — Care Management (Signed)
Reported during progression that patient has large amount of secretions.  Bronch is planned for today.  Head CT negative.  Cardiology consult completed due to run of [employer].  No further cardiac diagnostics at present.

## 2016-03-07 NOTE — Consult Note (Signed)
[facility] Consultation Note  Patient ID: Ebony [name], MRN: [id number], DOB/AGE: 1945-12-16 70 y.o. Admit date: 02/27/2016   Date of Consult: 03/07/2016 Primary Physician: Renee Rival, NP Primary Cardiologist:None  Chief Complaint:  Chief Complaint  Patient presents with  . Respiratory Distress   Reason for Consult: wide-complex tachycardia  HPI: 70 y.o. female with the known sleep apnea chronic obstructive pulmonary disease obesity diabetes with complications essential hypertension makes hyperlipidemia having acute respiratory distress requiring intubation and treatment. The patient has had a difficult time with oxygenation and sepsis for which the patient also is anemic with the high white count and multiple drips. The patient has had no evidence of myocardial infarction or congestive heart failure although has had wide complex tachycardia seen by telemetry for which there was nonsustained and has not recurred since yesterday. This may be secondary to current condition of sepsis and hypoxia and/or current illness rather than a true cardiomyopathy or myocardial infarction. Echocardiogram today has shown normal LV systolic function and no evidence of the cardiomyopathy inducing above. The patient currently is hemodynamically stable with appropriate medication management and oxygenation at this time  Past Medical History  Diagnosis Date  . Hyperlipidemia   . Hypertension   . Diabetes mellitus type 2, uncomplicated ([place])   . Hypothyroidism   . Psoriasis   . Depression   . COPD (chronic obstructive pulmonary disease) ([place])   . Osteoporosis, post-menopausal   . Vitamin D deficiency   . Sleep apnea   . Carpal tunnel syndrome   . Breast cancer ([place])   . Asthma       Surgical History:  Past Surgical History  Procedure Laterality Date  . Tubal ligation    . Right heel spur removed    . Carpal tunnel Right 1995  . Thumb arthroscopy Left 2000    release of a  nerve to the thumb.  . Breast lumpectomy with needle localization Left 10/06/2015    Procedure: BREAST LUMPECTOMY WITH NEEDLE LOCALIZATION;  Surgeon: Hubbard Robinson, MD;  Location: [facility] ORS;  Service: General;  Laterality: Left;  . Sentinel node biopsy Left 10/06/2015    Procedure: SENTINEL NODE BIOPSY;  Surgeon: Hubbard Robinson, MD;  Location: [facility] ORS;  Service: General;  Laterality: Left;  . Breast excisional biopsy Left 10/06/2015    np for surgery lumpectomy     Home Meds: Prior to Admission medications   Medication Sig Start Date End Date Taking? Authorizing Provider  Adalimumab (HUMIRA PEN-PSORIASIS STARTER) 40 MG/0.8ML PNKT Inject 40 mg into the skin every 14 (fourteen) days.    Yes Historical Provider, MD  albuterol (PROAIR HFA) 108 (90 BASE) MCG/ACT inhaler INHALE 2 PUFFS BY MOUTH EVERY 4 HOURS AS NEEDED FOR SHORTNESS OF BREATH AND/OR WHEEZING. 09/28/14  Yes Historical Provider, MD  albuterol (PROVENTIL) (2.5 MG/3ML) 0.083% nebulizer solution Inhale 1 vial using nebulizer every six hours as needed for shortness of breath and/or wheezing.   Yes Historical Provider, MD  aspirin EC 81 MG tablet Take 81 mg by mouth every morning.    Yes Historical Provider, MD  budesonide-formoterol (SYMBICORT) 160-4.5 MCG/ACT inhaler INHALE 2 PUFFS BY MOUTH TWICE DAILY 09/15/14  Yes Historical Provider, MD  buPROPion (WELLBUTRIN XL) 150 MG 24 hr tablet Take 150 mg by mouth every morning.    Yes Historical Provider, MD  citalopram (CELEXA) 40 MG tablet Take 40 mg by mouth every morning.   Yes Historical Provider, MD  clobetasol (TEMOVATE) 0.05 % external solution  Apply 1 application topically daily as needed (for psoriasis.).  02/15/16  Yes Historical Provider, MD  doxycycline (VIBRAMYCIN) 100 MG capsule Take 1 capsule (100 mg total) by mouth 2 (two) times daily. 02/15/16  Yes Carrie Mew, MD  furosemide (LASIX) 20 MG tablet Take 51m by mouth once a day.   Yes Historical Provider, MD  glipiZIDE  (GLUCOTROL) 5 MG tablet Take 1 tablet by mouth daily before lunch.  08/14/15  Yes Historical Provider, MD  insulin detemir (LEVEMIR) 100 UNIT/ML injection Inject 85 units subcutaneously twice a day   Yes Historical Provider, MD  insulin lispro (HUMALOG KWIKPEN) 100 UNIT/ML KiwkPen Inject 38-48 Units into the skin 3 (three) times daily.   Yes Historical Provider, MD  INVOKANA 100 MG TABS tablet Take 100 mg by mouth daily.  01/31/16  Yes Historical Provider, MD  letrozole (FEMARA) 2.5 MG tablet Take 1 tablet (2.5 mg total) by mouth daily. 01/14/16  Yes TLloyd Huger MD  levothyroxine (SYNTHROID, LEVOTHROID) 200 MCG tablet Take 200 mcg by mouth daily before breakfast. *Take along with 50 mcg to equal 250 mcg total dose*   Yes Historical Provider, MD  levothyroxine (SYNTHROID, LEVOTHROID) 50 MCG tablet Take 50 mcg by mouth daily before breakfast. *Take along with 200 mcg tablet to equal 250 mcg total dose.*   Yes Historical Provider, MD  losartan-hydrochlorothiazide (HYZAAR) 100-25 MG tablet Take 1 tablet by mouth daily. In am.   Yes Historical Provider, MD  metFORMIN (GLUCOPHAGE) 500 MG tablet Take 1,000 mg by mouth 2 (two) times daily with a meal.   Yes Historical Provider, MD  potassium chloride SA (K-DUR,KLOR-CON) 20 MEQ tablet Take 2 tablets (40 mEq total) by mouth 2 (two) times daily. 10/01/15  Yes CHubbard Robinson MD  simvastatin (ZOCOR) 40 MG tablet Take 40 mg by mouth at bedtime.    Yes Historical Provider, MD  tiotropium (SPIRIVA) 18 MCG inhalation capsule Place 18 mcg into inhaler and inhale daily.   Yes Historical Provider, MD  levofloxacin (LEVAQUIN) 500 MG tablet Take 1 tablet (500 mg total) by mouth daily. 10/12/15   SBettey Costa MD  predniSONE (DELTASONE) 20 MG tablet Take 2 tablets (40 mg total) by mouth daily. 02/15/16   PCarrie Mew MD    Inpatient Medications:  . antiseptic oral rinse  7 mL Mouth Rinse QID  . aspirin  81 mg Per NG tube Daily  . budesonide (PULMICORT)  nebulizer solution  0.25 mg Nebulization 4 times per day  . buPROPion  75 mg Per NG tube BID  . chlorhexidine gluconate (SAGE KIT)  15 mL Mouth Rinse BID  . citalopram  40 mg Per NG tube Daily  . enoxaparin (LOVENOX) injection  40 mg Subcutaneous Q24H  . famotidine  20 mg Oral BID  . free water  200 mL Per Tube Q8H  . furosemide  20 mg Intravenous Daily  . letrozole  2.5 mg Oral Daily  . levothyroxine  200 mcg Per NG tube QAC breakfast  . levothyroxine  50 mcg Oral QAC breakfast  . meropenem (MERREM) IV  1 g Intravenous Q8H  . polyethylene glycol  17 g Per Tube BID  . potassium chloride  40 mEq Oral Daily  . [START ON 03/09/2016] predniSONE  10 mg Oral Q breakfast  . sennosides  5 mL Per Tube Daily  . simvastatin  40 mg Oral QHS  . vancomycin  1,500 mg Intravenous Q12H  . voriconazole  4 mg/kg (Adjusted) Intravenous Q12H   .  sodium chloride 50 mL/hr at 03/07/16 0700  . feeding supplement (VITAL HIGH PROTEIN) 1,000 mL (03/07/16 0700)  . fentaNYL infusion INTRAVENOUS 150 mcg/hr (03/07/16 1205)  . insulin (NOVOLIN-R) infusion 3.8 Units/hr (03/07/16 1146)  . norepinephrine 3 mcg/min (03/04/16 6237)    Allergies:  Allergies  Allergen Reactions  . Other Hives and Rash    Shellfish. Shellfish Pt reports no allergic reaction to iodine or betadine.  . Shellfish Allergy Hives and Rash    Social History   Social History  . Marital Status: Married    Spouse Name: N/A  . Number of Children: N/A  . Years of Education: N/A   Occupational History  . Not on file.   Social History Main Topics  . Smoking status: Current Every Day Smoker -- 0.50 packs/day for 30 years    Types: Cigarettes    Last Attempt to Quit: 10/05/2015  . Smokeless tobacco: Never Used  . Alcohol Use: No  . Drug Use: No  . Sexual Activity: Not on file   Other Topics Concern  . Not on file   Social History Narrative     Family History  Problem Relation Age of Onset  . Diabetes Mother   . Thyroid disease  Mother   . COPD Father   . Emphysema Father   . Heart disease Brother   . Diabetes Brother   . Heart disease Brother   . Thyroid disease Daughter   . Thyroid disease Son      Review of Systems Cannot assess due to sedation  Labs: No results for input(s): CKTOTAL, CKMB, TROPONINI in the last 72 hours. Lab Results  Component Value Date   WBC 15.3* 03/07/2016   HGB 10.4* 03/07/2016   HCT 32.1* 03/07/2016   MCV 92.4 03/07/2016   PLT 228 03/07/2016    Recent Labs Lab 03/07/16 0509  NA 148*  K 4.0  CL 108  CO2 36*  BUN 41*  CREATININE 0.70  CALCIUM 9.3  GLUCOSE 170*   No results found for: CHOL, HDL, LDLCALC, TRIG No results found for: DDIMER  Radiology/Studies:  Dg Chest 1 View  03/07/2016  CLINICAL DATA:  Acute respiratory failure, history of COPD, breast malignancy EXAM: CHEST 1 VIEW COMPARISON:  Portable chest x-ray of March 06, 2016 FINDINGS: The lungs are adequately inflated. There are patchy areas of confluent density in both lungs more conspicuous today. The cardiac silhouette is normal in size. The pulmonary vascularity is not engorged. There is no pleural effusion. The mediastinum is normal in width. The right-sided PICC line tip projects over the proximal third of the SVC. IMPRESSION: Patchy alveolar opacities bilaterally slightly more conspicuous today worrisome for pneumonia. Density at the left lung base medially has improved slightly. There is no evidence of CHF or significant pleural effusion. Electronically Signed   By: David  Martinique M.D.   On: 03/07/2016 07:33   Dg Chest 1 View  02/29/2016  CLINICAL DATA:  Status post intubation today. EXAM: CHEST 1 VIEW COMPARISON:  Single view of the chest 02/27/2016. FINDINGS: New endotracheal tube is in place with the tip in good position just below the clavicular heads. A new NG tube is also identified. The tip of a tube projects over the left lower chest this may be the patient's NG tube. Lungs appear clear. Heart size is  normal. No pneumothorax or pleural effusion. IMPRESSION: ET tube in good position. NG tube position is indeterminate. Recommend dedicated plain film of the abdomen. Clear lungs. These results  will be called to the ordering clinician or representative by the Radiologist Assistant, and communication documented in the PACS or zVision Dashboard. Electronically Signed   By: Inge Rise M.D.   On: 02/29/2016 20:02   Dg Chest 2 View  02/15/2016  CLINICAL DATA:  Chest pain, back pain, dizziness, headache and shortness of breath. EXAM: CHEST - 2 VIEW COMPARISON:  10/09/2015 FINDINGS: The heart size and mediastinal contours are within normal limits. Bibasilar atelectasis/scarring present with probable component of underlying chronic lung disease. There is no evidence of pulmonary edema, focal consolidation, pneumothorax, nodule or pleural fluid. The visualized skeletal structures are unremarkable. IMPRESSION: Bibasilar scarring/atelectasis with probable underlying chronic lung disease. Electronically Signed   By: Aletta Edouard M.D.   On: 02/15/2016 19:20   Dg Abd 1 View  02/29/2016  CLINICAL DATA:  NG tube placement.  Femoral line placement. EXAM: ABDOMEN - 1 VIEW COMPARISON:  CT 02/15/2016 FINDINGS: Tip and side port of the enteric tube below the diaphragm in the stomach. No dilated bowel loops to suggest obstruction. Air throughout normal caliber small and large bowel. Tip of the right femoral line projects over the region of the common iliac vein. No evidence of acute osseous abnormality. IMPRESSION: 1. Tip and side port of the enteric tube below the diaphragm in the stomach. 2. Tip of the right femoral catheter projects over the expected location of the proximal right common iliac vein. Electronically Signed   By: Jeb Levering M.D.   On: 02/29/2016 20:01   Ct Head Wo Contrast  03/06/2016  CLINICAL DATA:  Difficulty speaking EXAM: CT HEAD WITHOUT CONTRAST TECHNIQUE: Contiguous axial images were obtained  from the base of the skull through the vertex without intravenous contrast. COMPARISON:  None. FINDINGS: Bony calvarium is intact. No findings to suggest acute hemorrhage, acute infarction or space-occupying mass lesion are noted. IMPRESSION: No acute abnormality noted. Electronically Signed   By: Inez Catalina M.D.   On: 03/06/2016 15:12   Ct Angio Chest Pe W/cm &/or Wo Cm  02/15/2016  CLINICAL DATA:  Shortness of breath. Right lower quadrant abdominal pain since this morning. EXAM: CT ANGIOGRAPHY CHEST CT ABDOMEN AND PELVIS WITH CONTRAST TECHNIQUE: Multidetector CT imaging of the chest was performed using the standard protocol during bolus administration of intravenous contrast. Multiplanar CT image reconstructions and MIPs were obtained to evaluate the vascular anatomy. Multidetector CT imaging of the abdomen and pelvis was performed using the standard protocol during bolus administration of intravenous contrast. CONTRAST:  100 mL Isovue 370 intravenous COMPARISON:  06/02/2013 FINDINGS: CTA CHEST FINDINGS Cardiovascular: There is good opacification of the pulmonary arteries. There is no pulmonary embolism. The thoracic aorta is normal in caliber and intact. Lungs: No confluent consolidation. 6 mm nodule at the lateral aspect of the right lower lobe on axial image 79 series 6. This is new from 10/07/2015 and likely inflammatory. The peripheral opacities in the left lower lobe observed on our prior study have resolved. There also is a calcified granuloma in the right upper lobe at the level of the carina. Central airways: Patent Effusions: None Lymphadenopathy: None Esophagus: Unremarkable Musculoskeletal: No significant abnormality CT ABDOMEN and PELVIS FINDINGS Hepatobiliary: There are normal appearances of the liver, gallbladder and bile ducts. Pancreas: Normal Spleen: Normal Adrenals/Urinary Tract: Benign appearing thickening of both adrenals, unchanged from 06/02/2013. Multiple 1.5-2.0 cm hypodensities of the  kidneys, likely cysts, not significantly changed. Collecting systems, ureters and urinary bladder appear unremarkable. Stomach/Bowel: Stomach and small bowel are unremarkable. Moderate  colonic diverticulosis. No evidence of acute diverticulitis or other acute inflammatory process. No bowel obstruction. Oral contrast has progressed through to the cecum. Appendix is normal. Vascular/Lymphatic: The abdominal aorta is normal in caliber. There is mild atherosclerotic calcification. There is no adenopathy in the abdomen or pelvis. Reproductive: Multiple small uterine fibroids. Ovaries are unremarkable. Other: No acute findings are evident in the abdomen or pelvis. There is no ascites. Musculoskeletal: No significant musculoskeletal lesion. Review of the MIP images confirms the above findings. IMPRESSION: 1. Negative for acute pulmonary embolism. 2. Mild nodularity in the lung bases, with resolution of nodular opacities observed in November and development of a new 6 mm right lower lobe nodule. This is most likely inflammatory given the interval changes. 3. Normal appendix. 4. No acute findings are evident in the abdomen or pelvis. 5. Diverticulosis. 6. Multiple small uterine fibroids. Electronically Signed   By: Andreas Newport M.D.   On: 02/15/2016 21:43   Ct Abdomen Pelvis W Contrast  02/15/2016  CLINICAL DATA:  Shortness of breath. Right lower quadrant abdominal pain since this morning. EXAM: CT ANGIOGRAPHY CHEST CT ABDOMEN AND PELVIS WITH CONTRAST TECHNIQUE: Multidetector CT imaging of the chest was performed using the standard protocol during bolus administration of intravenous contrast. Multiplanar CT image reconstructions and MIPs were obtained to evaluate the vascular anatomy. Multidetector CT imaging of the abdomen and pelvis was performed using the standard protocol during bolus administration of intravenous contrast. CONTRAST:  100 mL Isovue 370 intravenous COMPARISON:  06/02/2013 FINDINGS: CTA CHEST  FINDINGS Cardiovascular: There is good opacification of the pulmonary arteries. There is no pulmonary embolism. The thoracic aorta is normal in caliber and intact. Lungs: No confluent consolidation. 6 mm nodule at the lateral aspect of the right lower lobe on axial image 79 series 6. This is new from 10/07/2015 and likely inflammatory. The peripheral opacities in the left lower lobe observed on our prior study have resolved. There also is a calcified granuloma in the right upper lobe at the level of the carina. Central airways: Patent Effusions: None Lymphadenopathy: None Esophagus: Unremarkable Musculoskeletal: No significant abnormality CT ABDOMEN and PELVIS FINDINGS Hepatobiliary: There are normal appearances of the liver, gallbladder and bile ducts. Pancreas: Normal Spleen: Normal Adrenals/Urinary Tract: Benign appearing thickening of both adrenals, unchanged from 06/02/2013. Multiple 1.5-2.0 cm hypodensities of the kidneys, likely cysts, not significantly changed. Collecting systems, ureters and urinary bladder appear unremarkable. Stomach/Bowel: Stomach and small bowel are unremarkable. Moderate colonic diverticulosis. No evidence of acute diverticulitis or other acute inflammatory process. No bowel obstruction. Oral contrast has progressed through to the cecum. Appendix is normal. Vascular/Lymphatic: The abdominal aorta is normal in caliber. There is mild atherosclerotic calcification. There is no adenopathy in the abdomen or pelvis. Reproductive: Multiple small uterine fibroids. Ovaries are unremarkable. Other: No acute findings are evident in the abdomen or pelvis. There is no ascites. Musculoskeletal: No significant musculoskeletal lesion. Review of the MIP images confirms the above findings. IMPRESSION: 1. Negative for acute pulmonary embolism. 2. Mild nodularity in the lung bases, with resolution of nodular opacities observed in November and development of a new 6 mm right lower lobe nodule. This is most  likely inflammatory given the interval changes. 3. Normal appendix. 4. No acute findings are evident in the abdomen or pelvis. 5. Diverticulosis. 6. Multiple small uterine fibroids. Electronically Signed   By: Andreas Newport M.D.   On: 02/15/2016 21:43   Dg Chest Port 1 View  03/06/2016  CLINICAL DATA:  PICC line placement.  EXAM: PORTABLE CHEST 1 VIEW COMPARISON:  Earlier the same date and 03/05/2016. FINDINGS: 1839 hours. Interval right arm PICC placement, tip extending to the mid SVC level. The endotracheal and nasogastric tubes appear unchanged. The heart size and mediastinal contours are stable. There is interval improved aeration of the lung bases. No pneumothorax or significant pleural effusion seen. IMPRESSION: Right arm PICC extends to the mid SVC level. Interval mildly improved basilar aeration. Electronically Signed   By: Richardean Sale M.D.   On: 03/06/2016 19:01   Dg Chest Port 1 View  03/06/2016  CLINICAL DATA:  Acute respiratory distress EXAM: PORTABLE CHEST 1 VIEW COMPARISON:  03/05/2016 FINDINGS: Endotracheal tube and NG tube again identified. Tip of NG tube not visible due to technical factors. Heart size and vascular pattern normal. Mild opacity left base appears most consistent with atelectasis. IMPRESSION: Mild left base atelectasis Electronically Signed   By: Skipper Cliche M.D.   On: 03/06/2016 07:52   Dg Chest Port 1 View  03/05/2016  CLINICAL DATA:  Acute respiratory failure. Worsening shortness of breath. COPD. EXAM: PORTABLE CHEST 1 VIEW COMPARISON:  03/04/2016 FINDINGS: Endotracheal tube and nasogastric tube remain in appropriate position. The heart size and mediastinal contours are within normal limits. Both lungs are clear. The visualized skeletal structures are unremarkable. IMPRESSION: Endotracheal tube and nasogastric tube in appropriate position. No active disease. Electronically Signed   By: Earle Gell M.D.   On: 03/05/2016 10:08   Dg Chest Port 1 View  03/04/2016   CLINICAL DATA:  Acute respiratory failure.  Subsequent encounter. EXAM: PORTABLE CHEST 1 VIEW COMPARISON:  03/02/2016 FINDINGS: Lungs mildly hyperexpanded. No evidence of pneumonia or pulmonary edema. No convincing pleural effusion and no pneumothorax. Endotracheal tube and oral/ nasogastric tube are stable in well positioned. IMPRESSION: 1. No acute findings in the lungs. 2. Well-positioned support apparatus. 3. No change from the previous day's study. Electronically Signed   By: Lajean Manes M.D.   On: 03/04/2016 07:48   Dg Chest Port 1 View  03/02/2016  CLINICAL DATA:  Acute respiratory failure.  Subsequent encounter. EXAM: PORTABLE CHEST 1 VIEW COMPARISON:  02/29/2016 FINDINGS: Cardiac silhouette is normal in size. Normal mediastinal and hilar contours. Lungs are hyperexpanded. Small stable granuloma noted in the right mid lung. Lungs otherwise clear. No pleural effusion. No pneumothorax. Endotracheal tube is stable in well positioned. Nasogastric tube passes below the diaphragm into the proximal to mid stomach. IMPRESSION: 1. No acute cardiopulmonary disease. 2. Support apparatus is well positioned. Electronically Signed   By: Lajean Manes M.D.   On: 03/02/2016 15:05   Dg Chest Portable 1 View  02/27/2016  CLINICAL DATA:  Respiratory distress since yesterday. Audible wheezing. Dry cough. COPD. Smoker. EXAM: PORTABLE CHEST 1 VIEW COMPARISON:  Radiographs and CT dated 02/15/2016. FINDINGS: Stable right upper lobe calcified granuloma. The 6 mm nodule seen in the right lower lobe on the CT is not visible on the current or previous radiographs. The cardiac silhouette is currently borderline enlarged with a mild increase in size. The upper lung zone pulmonary vasculature remains mildly prominent. Stable mild prominence of the interstitial markings. The lungs are hyperexpanded. Diffuse osteopenia. IMPRESSION: 1. Interval borderline cardiomegaly. 2. Stable mild pulmonary vascular congestion and changes of COPD.  Electronically Signed   By: Claudie Revering M.D.   On: 02/27/2016 07:47    EKG: Normal sinus rhythm  Weights: Filed Weights   03/05/16 0446 03/06/16 0540 03/07/16 0503  Weight: 237 lb 10.5 oz (107.8 kg)  225 lb 15.5 oz (102.5 kg) 225 lb 12 oz (102.4 kg)     Physical Exam: Blood pressure 139/74, pulse 107, temperature 100.9 F (38.3 C), temperature source Rectal, resp. rate 10, height 5' 3"  (1.6 m), weight 225 lb 12 oz (102.4 kg), SpO2 99 %. Body mass index is 40 kg/(m^2). General: Well developed, well nourished,Sedated. Head eyes ears nose throat: Normocephalic, atraumatic, sclera non-icteric, no xanthomas, nares are without discharge. No apparent thyromegaly and/or mass  Lungs: Normal respiratory effort. Diffuse wheezes, no rales, some rhonchi.  Heart: RRR with normal S1 S2. no murmur gallop, no rub, PMI is diffuse carotid upstroke normal without bruit, jugular venous pressure is normal Abdomen: Soft, non-tender,  distended with normoactive bowel sounds. No hepatomegaly. No rebound/guarding. No obvious abdominal masses. Abdominal aorta is normal size without bruit Extremities: 1-2+ edema. no cyanosis, no clubbing, no ulcers  Peripheral : 2+ bilateral upper extremity pulses, 2+ bilateral femoral pulses, 2+ bilateral dorsal pedal pulse Neuro: Not Alert and oriented. No facial asymmetry. No focal deficit. Moves all extremities spontaneously due to sedation Musculoskeletal: Normal muscle tone without kyphosis Psych:  Does not Responds to questions appropriately with a normal affect.    Assessment: 70 year old female with sleep apnea COPD hypertension hyperlipidemia with sepsis with a wide complex tachycardia of unknown etiology with normal LV systolic function likely secondary to electrolyte abnormalities and current illness and/or hypoxia which has not returned and/or reoccurred  Plan: 1. Continue aggressive medication management for sepsis and hypoxia 2. Antibiotics for sepsis and  pulmonary respiratory infection with respiratory failure 3. Continue beta blocker for heart rate control blood pressure control and possible tachycardia reoccurrence 4. No further cardiac diagnostics at this time 5. Continue telemetry to assess for recurrence of tachycardia and further additional medication management as needed  Signed, Corey Skains M.D. [facility] 03/07/2016, 1:26 PM

## 2016-03-07 NOTE — Progress Notes (Signed)
PULMONARY / CRITICAL CARE MEDICINE   Name: Ebony [name] MRN: [id number] DOB: 01-Jan-1946    ADMISSION DATE:  02/27/2016  BRIEF HISTORY: Ebony [name] is a 70 year old female with medical history of HTN, DM,chronic respiratory failure due to COPD,sleep apnea, Hyperlipidemia, hypothyroidism, depression, morbid obesity who presented to [facility] on 4/16 with increased shortness of breath . She was placed on BiPAP and transitioned to HFNC, but the patient continues to complaint of increased shortness of breath, tightness, patient had a hard time to speak in full sentences. Patient was transferred to ICU for further and PCCM team was consulted for further management. Patient denies any chest pain, nausea, vomiting, dizziness , diaphoresis. Patient was found to be lethargic and had increased work of breathing therefore was intubated on 02/28/18. Course complicated by persistent fevers.  SUBJECTIVE:  Patient remains orally intubated,Patient is off sedation. Patient was febrile overnight.  She also had 18 beats of V-tach yesterday morning.    SIGNIFICANT EVENTS: 4/17>>AECOPD, Bronchitis, requiring continuous Bipap 4/18>>intermitted bipap, bridge with HFNC  VITAL SIGNS: Temp:  [100.8 F (38.2 C)-102.2 F (39 C)] 101.1 F (38.4 C) (04/25 0700) Pulse Rate:  [55-83] 69 (04/25 0700) Resp:  [21-24] 24 (04/25 0700) BP: (86-167)/(45-65) 167/53 mmHg (04/25 0700) SpO2:  [93 %-100 %] 99 % (04/25 0700) FiO2 (%):  [35 %] 35 % (04/25 0756) Weight:  [225 lb 12 oz (102.4 kg)] 225 lb 12 oz (102.4 kg) (04/25 0503) HEMODYNAMICS:   VENTILATOR SETTINGS: Vent Mode:  [-] PRVC FiO2 (%):  [35 %] 35 % Set Rate:  [24 bmp] 24 bmp Vt Set:  [450 mL] 450 mL PEEP:  [5 cmH20] 5 cmH20 INTAKE / OUTPUT:  Intake/Output Summary (Last 24 hours) at 03/07/16 0850 Last data filed at 03/07/16 0700  Gross per 24 hour  Intake 4761.27 ml  Output   1425 ml  Net 3336.27 ml    Review of Systems  Unable to perform ROS:  critical illness    Physical Exam  Constitutional: No distress.  HENT:  Head: Normocephalic and atraumatic.  Eyes: Pupils are equal, round, and reactive to light. Right eye exhibits no discharge. Left eye exhibits no discharge.  Neck: Normal range of motion. Neck supple. No JVD present. No tracheal deviation present.  Cardiovascular: Normal rate and regular rhythm.   No murmur heard. Pulmonary/Chest: She has no wheezes. She has no rales.  Abdominal: She exhibits distension. There is no tenderness. There is no guarding.  Neurological:  gcs<8T  Skin: Skin is warm. She is not diaphoretic.   on lung auscultation today. There is no significant change, there is decreased air entry bilaterally.  CBC  Recent Labs Lab 03/05/16 0531 03/06/16 0633 03/07/16 0509  WBC 10.6 13.2* 15.3*  HGB 10.8* 10.3* 10.4*  HCT 33.5* 32.2* 32.1*  PLT 191 210 228   Coag's No results for input(s): APTT, INR in the last 168 hours. BMET  Recent Labs Lab 03/05/16 0531 03/06/16 0633 03/07/16 0509  NA 148* 148* 148*  K 4.0 4.1 4.0  CL 108 107 108  CO2 34* 36* 36*  BUN 26* 29* 41*  CREATININE 0.57 0.56 0.70  GLUCOSE 174* 199* 170*   Electrolytes  Recent Labs Lab 02/29/16 1510  03/01/16 0537  03/05/16 0531 03/06/16 0633 03/07/16 0509  CALCIUM  --   < > 9.6  < > 9.2 9.2 9.3  MG 2.0  --  2.1  --  1.7 1.7  --   PHOS 4.8*  --  4.0  --   --   --   --   < > = values in this interval not displayed. Sepsis Markers  Recent Labs Lab 03/03/16 0959 03/04/16 0441 03/05/16 0500  PROCALCITON <0.10 <0.10 <0.10   ABG  Recent Labs Lab 03/01/16 1410 03/04/16 0930 03/05/16 0900  PHART 7.34* 7.37 7.41  PCO2ART 66* 64* 64*  PO2ART 101 92 81*   Liver Enzymes No results for input(s): AST, ALT, ALKPHOS, BILITOT, ALBUMIN in the last 168 hours. Cardiac Enzymes No results for input(s): TROPONINI, PROBNP in the last 168 hours. Glucose  Recent Labs Lab 03/06/16 2358 03/07/16 0415 03/07/16 0605  03/07/16 0704 03/07/16 0725 03/07/16 0816  GLUCAP 131* 216* 163* 134* 132* 158*    Imaging Dg Chest 1 View  03/07/2016  CLINICAL DATA:  Acute respiratory failure, history of COPD, breast malignancy EXAM: CHEST 1 VIEW COMPARISON:  Portable chest x-ray of March 06, 2016 FINDINGS: The lungs are adequately inflated. There are patchy areas of confluent density in both lungs more conspicuous today. The cardiac silhouette is normal in size. The pulmonary vascularity is not engorged. There is no pleural effusion. The mediastinum is normal in width. The right-sided PICC line tip projects over the proximal third of the SVC. IMPRESSION: Patchy alveolar opacities bilaterally slightly more conspicuous today worrisome for pneumonia. Density at the left lung base medially has improved slightly. There is no evidence of CHF or significant pleural effusion. Electronically Signed   By: David  Martinique M.D.   On: 03/07/2016 07:33   Ct Head Wo Contrast  03/06/2016  CLINICAL DATA:  Difficulty speaking EXAM: CT HEAD WITHOUT CONTRAST TECHNIQUE: Contiguous axial images were obtained from the base of the skull through the vertex without intravenous contrast. COMPARISON:  None. FINDINGS: Bony calvarium is intact. No findings to suggest acute hemorrhage, acute infarction or space-occupying mass lesion are noted. IMPRESSION: No acute abnormality noted. Electronically Signed   By: Inez Catalina M.D.   On: 03/06/2016 15:12   Dg Chest Port 1 View  03/06/2016  CLINICAL DATA:  PICC line placement. EXAM: PORTABLE CHEST 1 VIEW COMPARISON:  Earlier the same date and 03/05/2016. FINDINGS: 1839 hours. Interval right arm PICC placement, tip extending to the mid SVC level. The endotracheal and nasogastric tubes appear unchanged. The heart size and mediastinal contours are stable. There is interval improved aeration of the lung bases. No pneumothorax or significant pleural effusion seen. IMPRESSION: Right arm PICC extends to the mid SVC level.  Interval mildly improved basilar aeration. Electronically Signed   By: Richardean Sale M.D.   On: 03/06/2016 19:01    STUDIES:  4/4/ 17 >> CT chest was Negative for acute pulmonary embolism.Mild nodularity in the lung bases, with resolution of nodularopacities observed in November and development of a new 6 mm rightlower lobe nodule. This is most likely inflammatory given the interval changes. 03/06/16 CT Brain without contrast.>> 03/06/16 echo >>  CULTURES: Blood 4/20>> Sputum 4/20>> Urine 4/20>> Pos for Candida Krusei   ANTIBIOTICS: 4/16 levaquin>>4/20  4/20 Zosyn >>4/21 Meropenem 4/21>> Vanc 4/21>>   LINES/TUBES: ET tube. Right femoral line, discontinued 4/24  DISCUSSION:  70 year old female with known history of chronic respiratory failure due to COPD, Sleep apnea, hyperlipidemia, now with admitted with AECOPD/Bronchitis, now with persistent fevers   ASSESSMENT / PLAN  PULMONARY A: Acute on chronic respiratory failure COPD exacerbation with acute bronchiolitis Right lower lobe lung nodule (benign and old) Persistent fevers of uncertain etiology. Review of chest x-ray images  today shows continued hyperinflation consistent with emphysema, no new infiltrate is noted. P:   SBT trials in am as tolerated by the patient with goals of extubation Continue pulmicort/xopnex/ atrovent Continue steroid taper Continue antibiotics.    CARDIOVASCULAR A:  Shock Hypertension, Sinus Tachycardia Hyperlipidemia P:  Diltiazem PRN Continue Simvastatin Hold hyzaar Cardiology consult  We'll await echo results.   RENAL A:  Acute kidne injury - imrpoving P:  Monitor electrolytes Replace lytes per protocol  GASTROINTESTINAL A:  No active issues P:  Cont tf's We'll change PPI to H2 blocker for GI prophylaxis.  HEMATOLOGIC A:  Leukocytosis, this may be due to steroids. P:  lovenox for DVT prophylaxis SCD'S Continue to wean  steroids.  INFECTIOUS A:Sepsis  Fever of uncertain etiology Mold in, sputum, possibly Aspergillus, patient has been started empirically on voriconazole. We'll check IgE level. Urine positive for Candida P:  - f\u cultures as stated above - abx changed on 4/21  Due to persistent fevers over the past 24 hours ID Consult appreciated, continue antibiotics and antifungals per ID Stated Motrin for fever  ENDOCRINE A:  Diabetese Melitus-blood glucose appears adequately controlled. Hypothyroidism.  P:  Stop oral hypoglycemics ICU hypo/hyperglycemic protocol continue Insulin gtt Continue synthroid  NEUROLOGIC A:  No active issues P:  Follow CT brain RASS goal:0 -1   -Marda Stalker, M.D.  03/07/2016 Critical Care Attestation.  I have personally obtained a history, examined the patient, evaluated laboratory and imaging results, formulated the assessment and plan and placed orders. The Patient requires high complexity decision making for assessment and support, frequent evaluation and titration of therapies, application of advanced monitoring technologies and extensive interpretation of multiple databases. The patient has critical illness that could lead imminently to failure of 1 or more organ systems and requires the highest level of physician preparedness to intervene.  Critical Care Time 35 min devoted to patient care services described in this note is 35 minutes and is exclusive of time spent in procedures.

## 2016-03-08 ENCOUNTER — Inpatient Hospital Stay: Payer: Medicare Other

## 2016-03-08 LAB — GLUCOSE, CAPILLARY
GLUCOSE-CAPILLARY: 152 mg/dL — AB (ref 65–99)
GLUCOSE-CAPILLARY: 132 mg/dL — AB (ref 65–99)
GLUCOSE-CAPILLARY: 154 mg/dL — AB (ref 65–99)
GLUCOSE-CAPILLARY: 174 mg/dL — AB (ref 65–99)
GLUCOSE-CAPILLARY: 140 mg/dL — AB (ref 65–99)
GLUCOSE-CAPILLARY: 168 mg/dL — AB (ref 65–99)
GLUCOSE-CAPILLARY: 191 mg/dL — AB (ref 65–99)
GLUCOSE-CAPILLARY: 200 mg/dL — AB (ref 65–99)
GLUCOSE-CAPILLARY: 201 mg/dL — AB (ref 65–99)
GLUCOSE-CAPILLARY: 171 mg/dL — AB (ref 65–99)
GLUCOSE-CAPILLARY: 195 mg/dL — AB (ref 65–99)
Glucose-Capillary: 126 mg/dL — ABNORMAL HIGH (ref 65–99)
Glucose-Capillary: 165 mg/dL — ABNORMAL HIGH (ref 65–99)
Glucose-Capillary: 186 mg/dL — ABNORMAL HIGH (ref 65–99)
Glucose-Capillary: 189 mg/dL — ABNORMAL HIGH (ref 65–99)

## 2016-03-08 LAB — BASIC METABOLIC PANEL
ANION GAP: 5 (ref 5–15)
BUN: 42 mg/dL — ABNORMAL HIGH (ref 6–20)
CALCIUM: 9.2 mg/dL (ref 8.9–10.3)
CO2: 32 mmol/L (ref 22–32)
CREATININE: 0.69 mg/dL (ref 0.44–1.00)
Chloride: 106 mmol/L (ref 101–111)
GFR calc non Af Amer: >60 mL/min (ref >60)
Glucose, Bld: 194 mg/dL — ABNORMAL HIGH (ref 65–99)
Potassium: 4.2 mmol/L (ref 3.5–5.1)
SODIUM: 143 mmol/L (ref 135–145)

## 2016-03-08 LAB — BLOOD GAS, ARTERIAL
ACID-BASE EXCESS: 6.9 mmol/L — AB (ref 0.0–3.0)
Allens test (pass/fail): POSITIVE — AB
Bicarbonate: 36.8 mEq/L — ABNORMAL HIGH (ref 21.0–28.0)
FIO2: 0.35
Mechanical Rate: 24
O2 Saturation: 93.8 %
PEEP/CPAP: 5 cmH2O
PH ART: 7.25 — AB (ref 7.350–7.450)
Patient temperature: 37
VT: 450 mL
pCO2 arterial: 84 mmHg (ref 32.0–48.0)
pO2, Arterial: 81 mmHg — ABNORMAL LOW (ref 83.0–108.0)

## 2016-03-08 LAB — CBC
HCT: 32.5 % — ABNORMAL LOW (ref 35.0–47.0)
Hemoglobin: 10.2 g/dL — ABNORMAL LOW (ref 12.0–16.0)
MCH: 29.7 pg (ref 26.0–34.0)
MCHC: 31.3 g/dL — AB (ref 32.0–36.0)
MCV: 94.8 fL (ref 80.0–100.0)
Platelets: 247 10*3/uL (ref 150–440)
RBC: 3.42 MIL/uL — ABNORMAL LOW (ref 3.80–5.20)
RDW: 13.8 % (ref 11.5–14.5)
WBC: 15.3 10*3/uL — AB (ref 3.6–11.0)

## 2016-03-08 LAB — VANCOMYCIN, TROUGH: Vancomycin Tr: 26 ug/mL (ref 10–20)

## 2016-03-08 LAB — IGE: IgE (Immunoglobulin E), Serum: 114 IU/mL — ABNORMAL HIGH (ref 0–100)

## 2016-03-08 LAB — ECHOCARDIOGRAM COMPLETE
HEIGHTINCHES: 63 in
WEIGHTICAEL: 3612.02 [oz_av]

## 2016-03-08 MED ORDER — INSULIN ASPART 100 UNIT/ML ~~LOC~~ SOLN
2.0000 [IU] | SUBCUTANEOUS | Status: DC
  Administered 2016-03-08: 4 [IU] via SUBCUTANEOUS
  Administered 2016-03-08: 6 [IU] via SUBCUTANEOUS
  Administered 2016-03-08 – 2016-03-09 (×4): 4 [IU] via SUBCUTANEOUS
  Administered 2016-03-09: 6 [IU] via SUBCUTANEOUS
  Filled 2016-03-08: qty 6
  Filled 2016-03-08: qty 4
  Filled 2016-03-08: qty 6
  Filled 2016-03-08 (×4): qty 4

## 2016-03-08 MED ORDER — FENTANYL CITRATE (PF) 100 MCG/2ML IJ SOLN
50.0000 ug | Freq: Four times a day (QID) | INTRAMUSCULAR | Status: DC | PRN
  Administered 2016-03-09 – 2016-03-10 (×2): 50 ug via INTRAVENOUS
  Filled 2016-03-08: qty 2

## 2016-03-08 MED ORDER — ACETAMINOPHEN 160 MG/5ML PO SOLN
500.0000 mg | Freq: Four times a day (QID) | ORAL | Status: DC
  Administered 2016-03-08 – 2016-03-13 (×23): 500 mg
  Filled 2016-03-08 (×24): qty 20.3

## 2016-03-08 MED ORDER — BISACODYL 10 MG RE SUPP
10.0000 mg | Freq: Two times a day (BID) | RECTAL | Status: DC
  Administered 2016-03-08 (×2): 10 mg via RECTAL
  Filled 2016-03-08: qty 1

## 2016-03-08 MED ORDER — DEXTROSE 10 % IV SOLN: INTRAVENOUS | Status: DC | PRN

## 2016-03-08 MED ORDER — INSULIN ASPART 100 UNIT/ML ~~LOC~~ SOLN
3.0000 [IU] | SUBCUTANEOUS | Status: DC
Start: 2016-03-08 — End: 2016-03-09
  Administered 2016-03-08 – 2016-03-09 (×7): 3 [IU] via SUBCUTANEOUS
  Filled 2016-03-08 (×7): qty 3

## 2016-03-08 MED ORDER — INSULIN GLARGINE 100 UNIT/ML ~~LOC~~ SOLN
20.0000 [IU] | SUBCUTANEOUS | Status: DC
  Administered 2016-03-08 – 2016-03-09 (×2): 20 [IU] via SUBCUTANEOUS
  Filled 2016-03-08 (×2): qty 0.2

## 2016-03-08 MED ORDER — IOPAMIDOL (ISOVUE-300) INJECTION 61%: 100.0000 mL | Freq: Once | INTRAVENOUS | Status: DC | PRN

## 2016-03-08 MED ORDER — FUROSEMIDE 10 MG/ML IJ SOLN
20.0000 mg | Freq: Two times a day (BID) | INTRAMUSCULAR | Status: DC
  Administered 2016-03-08 – 2016-03-11 (×7): 20 mg via INTRAVENOUS
  Filled 2016-03-08 (×7): qty 2

## 2016-03-08 MED ORDER — FENTANYL CITRATE (PF) 100 MCG/2ML IJ SOLN
INTRAMUSCULAR | Status: AC
  Filled 2016-03-08: qty 2

## 2016-03-08 MED ORDER — VANCOMYCIN HCL 10 G IV SOLR
1250.0000 mg | Freq: Two times a day (BID) | INTRAVENOUS | Status: DC
  Administered 2016-03-08 – 2016-03-13 (×11): 1250 mg via INTRAVENOUS
  Filled 2016-03-08 (×13): qty 1250

## 2016-03-08 MED ORDER — ENOXAPARIN SODIUM 40 MG/0.4ML ~~LOC~~ SOLN
40.0000 mg | Freq: Two times a day (BID) | SUBCUTANEOUS | Status: DC
  Administered 2016-03-08 – 2016-03-11 (×6): 40 mg via SUBCUTANEOUS
  Filled 2016-03-08 (×6): qty 0.4

## 2016-03-08 NOTE — Progress Notes (Signed)
Patient SR on cardiac monitor. Sedation (Fentanyl drip) turned off since 10:00 this am. Patient will open eyes, but does not follow any commands. Will use Fentanyl and Versed IV pushes for agitation. Tube feeds infusing through OG. Patient transported to CT for abdomen, pelvis, and chest. No acute changes noted. Adequate UOP this shift. Son and husband updated on patient status. Family talked with Dr. Earlean Shawl this afternoon to address patient status and POC.

## 2016-03-08 NOTE — Progress Notes (Signed)
[facility] INFECTIOUS DISEASE PROGRESS NOTE Date of Admission:  02/27/2016     ID: Ebony [name] is a 70 y.o. female with sepsis, mold on sputum  Active Problems:   Acute respiratory failure (HCC)   History of infection not responsive to antibiotic therapy  Subjective: Remains febrile, on vent, cultures negative  ROS  Unable to obtain  Medications:  Antibiotics Given (last 72 hours)    Date/Time Action Medication Dose Rate   03/05/16 2251 Given   meropenem (MERREM) 1 g in sodium chloride 0.9 % 100 mL IVPB 1 g 200 mL/hr   03/06/16 0255 Given   vancomycin (VANCOCIN) 1,500 mg in sodium chloride 0.9 % 500 mL IVPB 1,500 mg 250 mL/hr   03/06/16 0536 Given   meropenem (MERREM) 1 g in sodium chloride 0.9 % 100 mL IVPB 1 g 200 mL/hr   03/06/16 1537 Given   meropenem (MERREM) 1 g in sodium chloride 0.9 % 100 mL IVPB 1 g 200 mL/hr   03/06/16 1712 Given   vancomycin (VANCOCIN) 1,500 mg in sodium chloride 0.9 % 500 mL IVPB 1,500 mg 250 mL/hr   03/06/16 2121 Given   meropenem (MERREM) 1 g in sodium chloride 0.9 % 100 mL IVPB 1 g 200 mL/hr   03/07/16 0305 Given   vancomycin (VANCOCIN) 1,500 mg in sodium chloride 0.9 % 500 mL IVPB 1,500 mg 250 mL/hr   03/07/16 0643 Given   meropenem (MERREM) 1 g in sodium chloride 0.9 % 100 mL IVPB 1 g 200 mL/hr   03/07/16 1419 Given   meropenem (MERREM) 1 g in sodium chloride 0.9 % 100 mL IVPB 1 g 200 mL/hr   03/07/16 1656 Given   vancomycin (VANCOCIN) 1,500 mg in sodium chloride 0.9 % 500 mL IVPB 1,500 mg 250 mL/hr   03/07/16 2118 Given   meropenem (MERREM) 1 g in sodium chloride 0.9 % 100 mL IVPB 1 g 200 mL/hr   03/08/16 0329 Given   vancomycin (VANCOCIN) 1,500 mg in sodium chloride 0.9 % 500 mL IVPB 1,500 mg 250 mL/hr     . acetaminophen (TYLENOL) oral liquid 160 mg/5 mL  500 mg Per Tube Q6H  . antiseptic oral rinse  7 mL Mouth Rinse QID  . aspirin  81 mg Per NG tube Daily  . bisacodyl  10 mg Rectal BID  . budesonide (PULMICORT) nebulizer  solution  0.25 mg Nebulization 4 times per day  . buPROPion  75 mg Per NG tube BID  . chlorhexidine gluconate (SAGE KIT)  15 mL Mouth Rinse BID  . citalopram  20 mg Per NG tube Daily  . enoxaparin (LOVENOX) injection  40 mg Subcutaneous Q12H  . famotidine  20 mg Oral BID  . free water  200 mL Per Tube Q8H  . furosemide  20 mg Intravenous Q12H  . insulin aspart  2-6 Units Subcutaneous Q4H  . insulin aspart  3 Units Subcutaneous Q4H  . insulin glargine  20 Units Subcutaneous Q24H  . letrozole  2.5 mg Oral Daily  . levothyroxine  200 mcg Per NG tube QAC breakfast  . levothyroxine  50 mcg Oral QAC breakfast  . meropenem (MERREM) IV  1 g Intravenous Q8H  . polyethylene glycol  17 g Per Tube BID  . potassium chloride  40 mEq Oral Daily  . [START ON 03/09/2016] predniSONE  10 mg Oral Q breakfast  . sennosides  5 mL Per Tube Daily  . vancomycin  1,500 mg Intravenous Q12H  . voriconazole  4 mg/kg (Adjusted) Intravenous Q12H    Objective: Vital signs in last 24 hours: Temp:  [99.7 F (37.6 C)-101.1 F (38.4 C)] 100 F (37.8 C) (04/26 1200) Pulse Rate:  [66-91] 81 (04/26 1200) Resp:  [16-27] 22 (04/26 1200) BP: (101-154)/(44-64) 131/54 mmHg (04/26 1200) SpO2:  [87 %-100 %] 97 % (04/26 1200) FiO2 (%):  [35 %] 35 % (04/26 1110) Weight:  [114.6 kg (252 lb 10.4 oz)] 114.6 kg (252 lb 10.4 oz) (04/26 0420) Physical Exam  Constitutional:  Morbidly obese, intubated, ill appearing   HENT: [place]/AT, PERRLA, no scleral icterus Mouth/Throat: ett Cardiovascular: Normal rate, distant Pulmonary/Chest: Effort normal and breath sounds normal. No respiratory distress.  has no wheezes.  Neck = supple, no nuchal rigidity Abdominal: Soft. Bowel sounds are normal.  exhibits no distension. There is no tenderness.  Lymphadenopathy: no cervical adenopathy. No axillary adenopathy Neurological opens eye Skin: Skin is warm and dry. No rash noted. No erythema.  Psychiatric: intubated Access- R picc .    Lab  Results  Recent Labs  03/07/16 0509 03/08/16 0550  WBC 15.3* 15.3*  HGB 10.4* 10.2*  HCT 32.1* 32.5*  NA 148* 143  K 4.0 4.2  CL 108 106  CO2 36* 32  BUN 41* 42*  CREATININE 0.70 0.69    Microbiology: Results for orders placed or performed during the hospital encounter of 02/27/16  MRSA PCR Screening     Status: None   Collection Time: 02/28/16  4:43 PM  Result Value Ref Range Status   MRSA by PCR NEGATIVE NEGATIVE Final    Comment:        The GeneXpert MRSA Assay (FDA approved for NASAL specimens only), is one component of a comprehensive MRSA colonization surveillance program. It is not intended to diagnose MRSA infection nor to guide or monitor treatment for MRSA infections.   Culture, blood (Routine X 2) w Reflex to ID Panel     Status: None   Collection Time: 03/02/16  2:05 PM  Result Value Ref Range Status   Specimen Description BLOOD RIGHT HAND  Final   Special Requests   Final    BOTTLES DRAWN AEROBIC AND ANAEROBIC ANA 2ML AER .5ML   Culture NO GROWTH 5 DAYS  Final   Report Status 03/07/2016 FINAL  Final  Culture, expectorated sputum-assessment     Status: None   Collection Time: 03/02/16  3:25 PM  Result Value Ref Range Status   Specimen Description TRACHEAL ASPIRATE  Final   Special Requests NONE  Final   Sputum evaluation THIS SPECIMEN IS ACCEPTABLE FOR SPUTUM CULTURE  Final   Report Status 03/02/2016 FINAL  Final  Culture, respiratory (NON-Expectorated)     Status: None (Preliminary result)   Collection Time: 03/02/16  3:25 PM  Result Value Ref Range Status   Specimen Description TRACHEAL ASPIRATE  Final   Special Requests NONE Reflexed from [id number]  Final   Gram Stain MODERATE WBC SEEN FEW HYPHAE   Final   Culture   Final    MODERATE GROWTH MOLD REFERRED TO [facility] IN [place], [place] IDENTIFICATION/CONFIRMATION    Report Status PENDING  Incomplete  Culture, blood (Routine X 2) w Reflex to ID Panel      Status: None   Collection Time: 03/02/16  3:34 PM  Result Value Ref Range Status   Specimen Description BLOOD RIGHT HAND  Final   Special Requests BOTTLES DRAWN AEROBIC AND ANAEROBIC   10CC  Final   Culture  NO GROWTH 5 DAYS  Final   Report Status 03/07/2016 FINAL  Final  Urine culture     Status: Abnormal   Collection Time: 03/02/16  5:19 PM  Result Value Ref Range Status   Specimen Description URINE, RANDOM  Final   Special Requests NONE  Final   Culture >=100,000 COLONIES/mL CANDIDA KRUSEI (A)  Final   Report Status 03/05/2016 FINAL  Final  Culture, expectorated sputum-assessment     Status: None   Collection Time: 03/07/16 12:14 PM  Result Value Ref Range Status   Specimen Description BRONCHIAL ALVEOLAR LAVAGE  Final   Special Requests NONE  Final   Sputum evaluation THIS SPECIMEN IS ACCEPTABLE FOR SPUTUM CULTURE  Final   Report Status 03/07/2016 FINAL  Final  Culture, respiratory (NON-Expectorated)     Status: None (Preliminary result)   Collection Time: 03/07/16 12:14 PM  Result Value Ref Range Status   Specimen Description BRONCHIAL ALVEOLAR LAVAGE  Final   Special Requests NONE Reflexed from [id number]  Final   Gram Stain PENDING  Incomplete   Culture CULTURE REINCUBATED FOR BETTER GROWTH  Final   Report Status PENDING  Incomplete    Studies/Results: Dg Chest 1 View  03/08/2016  CLINICAL DATA:  Acute respiratory failure, history of COPD -asthma, hypertension, breast malignancy EXAM: CHEST 1 VIEW COMPARISON:  Portable chest x-ray of March 07, 2016 FINDINGS: The lungs remain hyperinflated. There is no pneumothorax or pleural effusion. The interstitial markings are mildly prominent with areas of patchy confluent density bilaterally. The heart is mildly enlarged. The pulmonary vascularity exhibits mild cephalization. The endotracheal tube tip projects 4.6 cm above the carina. The esophagogastric tube tip projects below the inferior margin of the image. The right-sided PICC line tip  projects over the midportion of the SVC. IMPRESSION: Fairly stable appearance of the chest allowing for differences in positioning. Persistent left basilar atelectasis or pneumonia. Persistent areas of patchy parenchymal density in both lungs likely reflecting pneumonia or parenchymal nodules. No pulmonary edema. Electronically Signed   By: David  Martinique M.D.   On: 03/08/2016 08:03   Dg Chest 1 View  03/07/2016  CLINICAL DATA:  Acute respiratory failure, history of COPD, breast malignancy EXAM: CHEST 1 VIEW COMPARISON:  Portable chest x-ray of March 06, 2016 FINDINGS: The lungs are adequately inflated. There are patchy areas of confluent density in both lungs more conspicuous today. The cardiac silhouette is normal in size. The pulmonary vascularity is not engorged. There is no pleural effusion. The mediastinum is normal in width. The right-sided PICC line tip projects over the proximal third of the SVC. IMPRESSION: Patchy alveolar opacities bilaterally slightly more conspicuous today worrisome for pneumonia. Density at the left lung base medially has improved slightly. There is no evidence of CHF or significant pleural effusion. Electronically Signed   By: David  Martinique M.D.   On: 03/07/2016 07:33   Dg Abd 1 View  03/07/2016  CLINICAL DATA:  No bowel movement for 2 days. EXAM: ABDOMEN - 1 VIEW COMPARISON:  Abdominal radiograph 02/29/2016. FINDINGS: Enteric tube tip and side-port project over the stomach. Lung bases are clear. Markedly limited exam secondary to body habitus. Gas is demonstrated within nondilated loops of large and small bowel. No definite evidence to suggest obstruction. IMPRESSION: Limited exam due to body habitus. No definite evidence to suggest obstruction. Electronically Signed   By: Lovey Newcomer M.D.   On: 03/07/2016 20:10   Ct Head Wo Contrast  03/06/2016  CLINICAL DATA:  Difficulty speaking EXAM: CT  HEAD WITHOUT CONTRAST TECHNIQUE: Contiguous axial images were obtained from the base  of the skull through the vertex without intravenous contrast. COMPARISON:  None. FINDINGS: Bony calvarium is intact. No findings to suggest acute hemorrhage, acute infarction or space-occupying mass lesion are noted. IMPRESSION: No acute abnormality noted. Electronically Signed   By: Inez Catalina M.D.   On: 03/06/2016 15:12   Dg Chest Port 1 View  03/06/2016  CLINICAL DATA:  PICC line placement. EXAM: PORTABLE CHEST 1 VIEW COMPARISON:  Earlier the same date and 03/05/2016. FINDINGS: 1839 hours. Interval right arm PICC placement, tip extending to the mid SVC level. The endotracheal and nasogastric tubes appear unchanged. The heart size and mediastinal contours are stable. There is interval improved aeration of the lung bases. No pneumothorax or significant pleural effusion seen. IMPRESSION: Right arm PICC extends to the mid SVC level. Interval mildly improved basilar aeration. Electronically Signed   By: Richardean Sale M.D.   On: 03/06/2016 19:01    Assessment/Plan: SERINE KEA is a 70 y.o. female with COPD, morbid obesity, admitted with respiratory failure, initally afebrile adn with nml wbc but now with recurrent fevers, currently intubated. Has been on meropenem and vanco with bcx neg, sputum cx with mold and fungal cx with candida krusei.  She has a R fem TLC in place since 4/18. Producing lots of thick sputum CXR does not show dense infiltrate. I do not think the mold is likely the cause of her resp compromise and fevers so would continue to pursue other sources. Removed R fem TLC as possible source of infection Given candiduria has had a change of foley cath  4/25- still febrile. Wbc up to 15.  4/26 - remains febrile, no other change  Recommendations Reviewed the mold with micro- likely aspergillus- cont voriconazole for now Cont other abx pending BAL done today  Would check ct chest abd pelvis to eval for abscess or empyema given her persistent fever and leukocytosis  Thank you very  much for the consult. Will follow with you.  Tanyon Alipio P   03/08/2016, 1:51 PM

## 2016-03-08 NOTE — Progress Notes (Signed)
Anticoagulation monitoring(Lovenox):  70 yo F ordered Lovenox 40 mg Q24h  Filed Weights   03/06/16 0540 03/07/16 0503 03/08/16 0420  Weight: 225 lb 15.5 oz (102.5 kg) 225 lb 12 oz (102.4 kg) 252 lb 10.4 oz (114.6 kg)   BMI 44.8 Lab Results  Component Value Date   CREATININE 0.69 03/08/2016   CREATININE 0.70 03/07/2016   CREATININE 0.56 03/06/2016   Estimated Creatinine Clearance: 83.3 mL/min (by C-G formula based on Cr of 0.69). Hemoglobin & Hematocrit     Component Value Date/Time   HGB 10.2* 03/08/2016 0550   HGB 14.3 11/20/2014 0450   HCT 32.5* 03/08/2016 0550   HCT 45.2 11/20/2014 0450     Per Protocol for Patient with estCrcl > 30 ml/min and BMI > 40, will transition to Lovenox 40 mg Q12h.      Ulice Dash, PharmD Clinical Pharmacist

## 2016-03-08 NOTE — Progress Notes (Signed)
[facility] Encounter Note  Patient: Ebony [name] / Admit Date: 02/27/2016 / Date of Encounter: 03/08/2016, 5:01 PM   Subjective: Patient has had normal sinus rhythm by telemetry with no evidence of hemodynamic compromise this time. No evidence of new rhythm disturbances or wide complex tachycardia  Review of Systems: Unable to assess due to sedation  Objective: Telemetry: Normal sinus rhythm Physical Exam: Blood pressure 145/56, pulse 92, temperature 100.8 F (38.2 C), temperature source Rectal, resp. rate 23, height 5' 3"  (1.6 m), weight 252 lb 10.4 oz (114.6 kg), SpO2 94 %. Body mass index is 44.77 kg/(m^2). General: Well developed, well nourished, in no acute distress. Head: Normocephalic, atraumatic, sclera non-icteric, no xanthomas, nares are without discharge. Neck: No apparent masses Lungs: Normal respirations with few wheezes, no rhonchi, no rales , some crackles   Heart: Regular rate and rhythm, normal S1 S2, no murmur, no rub, no gallop, PMI is normal size and placement, carotid upstroke normal without bruit, jugular venous pressure normal Abdomen: Soft,  no apparent hepatosplenomegaly Extremities: 1+ edema, no clubbing, no cyanosis, no ulcers,  Peripheral: 2+ radial, 2+ femoral, 2+ dorsal pedal pulses Neuro: Not Alert and oriented. Moves all extremities spontaneously. Psych: Does not Responds to questions appropriately with a normal affect.   Intake/Output Summary (Last 24 hours) at 03/08/16 1701 Last data filed at 03/08/16 1300  Gross per 24 hour  Intake 3098.49 ml  Output   2625 ml  Net 473.49 ml    Inpatient Medications:  . acetaminophen (TYLENOL) oral liquid 160 mg/5 mL  500 mg Per Tube Q6H  . antiseptic oral rinse  7 mL Mouth Rinse QID  . aspirin  81 mg Per NG tube Daily  . bisacodyl  10 mg Rectal BID  . budesonide (PULMICORT) nebulizer solution  0.25 mg Nebulization 4 times per day  . buPROPion  75 mg Per NG tube BID  .  chlorhexidine gluconate (SAGE KIT)  15 mL Mouth Rinse BID  . citalopram  20 mg Per NG tube Daily  . enoxaparin (LOVENOX) injection  40 mg Subcutaneous Q12H  . famotidine  20 mg Oral BID  . fentaNYL      . free water  200 mL Per Tube Q8H  . furosemide  20 mg Intravenous Q12H  . insulin aspart  2-6 Units Subcutaneous Q4H  . insulin aspart  3 Units Subcutaneous Q4H  . insulin glargine  20 Units Subcutaneous Q24H  . letrozole  2.5 mg Oral Daily  . levothyroxine  200 mcg Per NG tube QAC breakfast  . levothyroxine  50 mcg Oral QAC breakfast  . meropenem (MERREM) IV  1 g Intravenous Q8H  . polyethylene glycol  17 g Per Tube BID  . potassium chloride  40 mEq Oral Daily  . [START ON 03/09/2016] predniSONE  10 mg Oral Q breakfast  . sennosides  5 mL Per Tube Daily  . vancomycin  1,250 mg Intravenous Q12H  . voriconazole  4 mg/kg (Adjusted) Intravenous Q12H   Infusions:  . dextrose    . feeding supplement (VITAL HIGH PROTEIN) 1,000 mL (03/08/16 1600)  . fentaNYL infusion INTRAVENOUS 150 mcg/hr (03/08/16 0740)  . norepinephrine 3 mcg/min (03/04/16 0936)    Labs:  Recent Labs  03/06/16 0633 03/07/16 0509 03/08/16 0550  NA 148* 148* 143  K 4.1 4.0 4.2  CL 107 108 106  CO2 36* 36* 32  GLUCOSE 199* 170* 194*  BUN 29* 41* 42*  CREATININE 0.56 0.70 0.69  CALCIUM 9.2 9.3 9.2  MG 1.7  --   --    No results for input(s): AST, ALT, ALKPHOS, BILITOT, PROT, ALBUMIN in the last 72 hours.  Recent Labs  03/07/16 0509 03/08/16 0550  WBC 15.3* 15.3*  HGB 10.4* 10.2*  HCT 32.1* 32.5*  MCV 92.4 94.8  PLT 228 247   No results for input(s): CKTOTAL, CKMB, TROPONINI in the last 72 hours. Invalid input(s): POCBNP No results for input(s): HGBA1C in the last 72 hours.   Weights: Filed Weights   03/06/16 0540 03/07/16 0503 03/08/16 0420  Weight: 225 lb 15.5 oz (102.5 kg) 225 lb 12 oz (102.4 kg) 252 lb 10.4 oz (114.6 kg)     Radiology/Studies:  Dg Chest 1 View  03/08/2016  CLINICAL  DATA:  Acute respiratory failure, history of COPD -asthma, hypertension, breast malignancy EXAM: CHEST 1 VIEW COMPARISON:  Portable chest x-ray of March 07, 2016 FINDINGS: The lungs remain hyperinflated. There is no pneumothorax or pleural effusion. The interstitial markings are mildly prominent with areas of patchy confluent density bilaterally. The heart is mildly enlarged. The pulmonary vascularity exhibits mild cephalization. The endotracheal tube tip projects 4.6 cm above the carina. The esophagogastric tube tip projects below the inferior margin of the image. The right-sided PICC line tip projects over the midportion of the SVC. IMPRESSION: Fairly stable appearance of the chest allowing for differences in positioning. Persistent left basilar atelectasis or pneumonia. Persistent areas of patchy parenchymal density in both lungs likely reflecting pneumonia or parenchymal nodules. No pulmonary edema. Electronically Signed   By: David  Martinique M.D.   On: 03/08/2016 08:03   Dg Chest 1 View  03/07/2016  CLINICAL DATA:  Acute respiratory failure, history of COPD, breast malignancy EXAM: CHEST 1 VIEW COMPARISON:  Portable chest x-ray of March 06, 2016 FINDINGS: The lungs are adequately inflated. There are patchy areas of confluent density in both lungs more conspicuous today. The cardiac silhouette is normal in size. The pulmonary vascularity is not engorged. There is no pleural effusion. The mediastinum is normal in width. The right-sided PICC line tip projects over the proximal third of the SVC. IMPRESSION: Patchy alveolar opacities bilaterally slightly more conspicuous today worrisome for pneumonia. Density at the left lung base medially has improved slightly. There is no evidence of CHF or significant pleural effusion. Electronically Signed   By: David  Martinique M.D.   On: 03/07/2016 07:33   Dg Chest 1 View  02/29/2016  CLINICAL DATA:  Status post intubation today. EXAM: CHEST 1 VIEW COMPARISON:  Single view of  the chest 02/27/2016. FINDINGS: New endotracheal tube is in place with the tip in good position just below the clavicular heads. A new NG tube is also identified. The tip of a tube projects over the left lower chest this may be the patient's NG tube. Lungs appear clear. Heart size is normal. No pneumothorax or pleural effusion. IMPRESSION: ET tube in good position. NG tube position is indeterminate. Recommend dedicated plain film of the abdomen. Clear lungs. These results will be called to the ordering clinician or representative by the Radiologist Assistant, and communication documented in the PACS or zVision Dashboard. Electronically Signed   By: Inge Rise M.D.   On: 02/29/2016 20:02   Dg Chest 2 View  02/15/2016  CLINICAL DATA:  Chest pain, back pain, dizziness, headache and shortness of breath. EXAM: CHEST - 2 VIEW COMPARISON:  10/09/2015 FINDINGS: The heart size and mediastinal contours are within normal limits. Bibasilar atelectasis/scarring present with  probable component of underlying chronic lung disease. There is no evidence of pulmonary edema, focal consolidation, pneumothorax, nodule or pleural fluid. The visualized skeletal structures are unremarkable. IMPRESSION: Bibasilar scarring/atelectasis with probable underlying chronic lung disease. Electronically Signed   By: Aletta Edouard M.D.   On: 02/15/2016 19:20   Dg Abd 1 View  03/07/2016  CLINICAL DATA:  No bowel movement for 2 days. EXAM: ABDOMEN - 1 VIEW COMPARISON:  Abdominal radiograph 02/29/2016. FINDINGS: Enteric tube tip and side-port project over the stomach. Lung bases are clear. Markedly limited exam secondary to body habitus. Gas is demonstrated within nondilated loops of large and small bowel. No definite evidence to suggest obstruction. IMPRESSION: Limited exam due to body habitus. No definite evidence to suggest obstruction. Electronically Signed   By: Lovey Newcomer M.D.   On: 03/07/2016 20:10   Dg Abd 1 View  02/29/2016   CLINICAL DATA:  NG tube placement.  Femoral line placement. EXAM: ABDOMEN - 1 VIEW COMPARISON:  CT 02/15/2016 FINDINGS: Tip and side port of the enteric tube below the diaphragm in the stomach. No dilated bowel loops to suggest obstruction. Air throughout normal caliber small and large bowel. Tip of the right femoral line projects over the region of the common iliac vein. No evidence of acute osseous abnormality. IMPRESSION: 1. Tip and side port of the enteric tube below the diaphragm in the stomach. 2. Tip of the right femoral catheter projects over the expected location of the proximal right common iliac vein. Electronically Signed   By: Jeb Levering M.D.   On: 02/29/2016 20:01   Ct Head Wo Contrast  03/06/2016  CLINICAL DATA:  Difficulty speaking EXAM: CT HEAD WITHOUT CONTRAST TECHNIQUE: Contiguous axial images were obtained from the base of the skull through the vertex without intravenous contrast. COMPARISON:  None. FINDINGS: Bony calvarium is intact. No findings to suggest acute hemorrhage, acute infarction or space-occupying mass lesion are noted. IMPRESSION: No acute abnormality noted. Electronically Signed   By: Inez Catalina M.D.   On: 03/06/2016 15:12   Ct Chest W Contrast  03/08/2016  CLINICAL DATA:  70 year old female with history of chronic respiratory failure due to COPD. Sleep apnea and hyperlipidemia. Currently admitted with acute COPD exacerbation with persistent fevers and ventilatory dependent respiratory failure. EXAM: CT CHEST, ABDOMEN, AND PELVIS WITH CONTRAST TECHNIQUE: Multidetector CT imaging of the chest, abdomen and pelvis was performed following the standard protocol during bolus administration of intravenous contrast. CONTRAST:  100 mL of Isovue-300. COMPARISON:  CT of the chest, abdomen and pelvis 02/15/2016. FINDINGS: CT CHEST FINDINGS Mediastinum/Lymph Nodes: Within tracheal to approximately 4.5 cm above the carina. Right upper extremity PICC with tip terminating at the  superior cavoatrial junction. Nasogastric to extending into the stomach. Heart size is normal. There is no significant pericardial fluid, thickening or pericardial calcification. Calcifications of the inferior mitral annulus. There is atherosclerosis of the thoracic aorta, the great vessels of the mediastinum and the coronary arteries, including calcified atherosclerotic plaque in the left anterior descending coronary arteries. No pathologically enlarged mediastinal or hilar lymph nodes. Esophagus is unremarkable in appearance. No axillary lymphadenopathy. Lungs/Pleura: Compared to the prior study there is now extensive patchy peribronchovascular airspace consolidation which is somewhat nodular in appearance, compatible with severe multilobar bronchopneumonia. This is most confluent in the posterior aspect of the right lower lobe. Trace right pleural effusion. Musculoskeletal/Soft Tissues: There are no aggressive appearing lytic or blastic lesions noted in the visualized portions of the skeleton. CT ABDOMEN AND PELVIS  FINDINGS Hepatobiliary: No cystic or solid hepatic lesions. No intra or extrahepatic biliary ductal dilatation. Gallbladder is normal in appearance. Pancreas: No pancreatic mass. No pancreatic ductal dilatation. No pancreatic or peripancreatic fluid or inflammatory changes. Spleen: Unremarkable. Adrenals/Urinary Tract: Right adrenal gland is normal in appearance. Diffusely thickened left adrenal gland is unchanged. Multiple sub cm low-attenuation lesions are noted in the kidneys bilaterally, too small to definitively characterize, but favored to represent tiny cysts. There are other larger low-attenuation lesions which do not enhance in the kidneys bilaterally, largest of which is 2.1 cm in the lower pole of the left kidney, compatible with simple cysts. No hydroureteronephrosis. Urinary bladder is nearly completely decompressed, with a Foley balloon catheter in place. Small amount of gas non  dependently in the urinary bladder is iatrogenic. Stomach/Bowel: Nasogastric tube terminating in the proximal antrum of the stomach. The appearance of the stomach is normal. No pathologic dilatation of small bowel or colon. Numerous colonic diverticulae are noted, without definite surrounding inflammatory changes to suggest an acute diverticulitis at this time. Vascular/Lymphatic: Atherosclerosis throughout the abdominal and pelvic vasculature, without evidence of aneurysm or dissection. No lymphadenopathy noted in the abdomen or pelvis. Reproductive: Uterus is heterogeneous with multiple coarse calcifications, compatible with calcified fibroids. Ovaries are unremarkable in appearance. Other: Trace volume of ascites, most pronounced adjacent to the liver. Mild diffuse mesenteric edema. No pneumoperitoneum. Musculoskeletal: There are no aggressive appearing lytic or blastic lesions noted in the visualized portions of the skeleton. IMPRESSION: 1. The appearance of the lungs is compatible with severe multilobar bronchopneumonia. 2. Trace right pleural effusion. 3. Trace volume of ascites and mild diffuse mesenteric edema. 4. Atherosclerosis, including left anterior descending coronary artery disease. Please note that although the presence of coronary artery calcium documents the presence of coronary artery disease, the severity of this disease and any potential stenosis cannot be assessed on this non-gated CT examination. Assessment for potential risk factor modification, dietary therapy or pharmacologic therapy may be warranted, if clinically indicated. 5. Colonic diverticulosis without definite findings to suggest an acute diverticulitis at this time. 6. Additional incidental findings, as above. Electronically Signed   By: Vinnie Langton M.D.   On: 03/08/2016 16:06   Ct Angio Chest Pe W/cm &/or Wo Cm  02/15/2016  CLINICAL DATA:  Shortness of breath. Right lower quadrant abdominal pain since this morning. EXAM: CT  ANGIOGRAPHY CHEST CT ABDOMEN AND PELVIS WITH CONTRAST TECHNIQUE: Multidetector CT imaging of the chest was performed using the standard protocol during bolus administration of intravenous contrast. Multiplanar CT image reconstructions and MIPs were obtained to evaluate the vascular anatomy. Multidetector CT imaging of the abdomen and pelvis was performed using the standard protocol during bolus administration of intravenous contrast. CONTRAST:  100 mL Isovue 370 intravenous COMPARISON:  06/02/2013 FINDINGS: CTA CHEST FINDINGS Cardiovascular: There is good opacification of the pulmonary arteries. There is no pulmonary embolism. The thoracic aorta is normal in caliber and intact. Lungs: No confluent consolidation. 6 mm nodule at the lateral aspect of the right lower lobe on axial image 79 series 6. This is new from 10/07/2015 and likely inflammatory. The peripheral opacities in the left lower lobe observed on our prior study have resolved. There also is a calcified granuloma in the right upper lobe at the level of the carina. Central airways: Patent Effusions: None Lymphadenopathy: None Esophagus: Unremarkable Musculoskeletal: No significant abnormality CT ABDOMEN and PELVIS FINDINGS Hepatobiliary: There are normal appearances of the liver, gallbladder and bile ducts. Pancreas: Normal Spleen: Normal Adrenals/Urinary  Tract: Benign appearing thickening of both adrenals, unchanged from 06/02/2013. Multiple 1.5-2.0 cm hypodensities of the kidneys, likely cysts, not significantly changed. Collecting systems, ureters and urinary bladder appear unremarkable. Stomach/Bowel: Stomach and small bowel are unremarkable. Moderate colonic diverticulosis. No evidence of acute diverticulitis or other acute inflammatory process. No bowel obstruction. Oral contrast has progressed through to the cecum. Appendix is normal. Vascular/Lymphatic: The abdominal aorta is normal in caliber. There is mild atherosclerotic calcification. There is  no adenopathy in the abdomen or pelvis. Reproductive: Multiple small uterine fibroids. Ovaries are unremarkable. Other: No acute findings are evident in the abdomen or pelvis. There is no ascites. Musculoskeletal: No significant musculoskeletal lesion. Review of the MIP images confirms the above findings. IMPRESSION: 1. Negative for acute pulmonary embolism. 2. Mild nodularity in the lung bases, with resolution of nodular opacities observed in November and development of a new 6 mm right lower lobe nodule. This is most likely inflammatory given the interval changes. 3. Normal appendix. 4. No acute findings are evident in the abdomen or pelvis. 5. Diverticulosis. 6. Multiple small uterine fibroids. Electronically Signed   By: Andreas Newport M.D.   On: 02/15/2016 21:43   Ct Abdomen Pelvis W Contrast  03/08/2016  CLINICAL DATA:  70 year old female with history of chronic respiratory failure due to COPD. Sleep apnea and hyperlipidemia. Currently admitted with acute COPD exacerbation with persistent fevers and ventilatory dependent respiratory failure. EXAM: CT CHEST, ABDOMEN, AND PELVIS WITH CONTRAST TECHNIQUE: Multidetector CT imaging of the chest, abdomen and pelvis was performed following the standard protocol during bolus administration of intravenous contrast. CONTRAST:  100 mL of Isovue-300. COMPARISON:  CT of the chest, abdomen and pelvis 02/15/2016. FINDINGS: CT CHEST FINDINGS Mediastinum/Lymph Nodes: Within tracheal to approximately 4.5 cm above the carina. Right upper extremity PICC with tip terminating at the superior cavoatrial junction. Nasogastric to extending into the stomach. Heart size is normal. There is no significant pericardial fluid, thickening or pericardial calcification. Calcifications of the inferior mitral annulus. There is atherosclerosis of the thoracic aorta, the great vessels of the mediastinum and the coronary arteries, including calcified atherosclerotic plaque in the left anterior  descending coronary arteries. No pathologically enlarged mediastinal or hilar lymph nodes. Esophagus is unremarkable in appearance. No axillary lymphadenopathy. Lungs/Pleura: Compared to the prior study there is now extensive patchy peribronchovascular airspace consolidation which is somewhat nodular in appearance, compatible with severe multilobar bronchopneumonia. This is most confluent in the posterior aspect of the right lower lobe. Trace right pleural effusion. Musculoskeletal/Soft Tissues: There are no aggressive appearing lytic or blastic lesions noted in the visualized portions of the skeleton. CT ABDOMEN AND PELVIS FINDINGS Hepatobiliary: No cystic or solid hepatic lesions. No intra or extrahepatic biliary ductal dilatation. Gallbladder is normal in appearance. Pancreas: No pancreatic mass. No pancreatic ductal dilatation. No pancreatic or peripancreatic fluid or inflammatory changes. Spleen: Unremarkable. Adrenals/Urinary Tract: Right adrenal gland is normal in appearance. Diffusely thickened left adrenal gland is unchanged. Multiple sub cm low-attenuation lesions are noted in the kidneys bilaterally, too small to definitively characterize, but favored to represent tiny cysts. There are other larger low-attenuation lesions which do not enhance in the kidneys bilaterally, largest of which is 2.1 cm in the lower pole of the left kidney, compatible with simple cysts. No hydroureteronephrosis. Urinary bladder is nearly completely decompressed, with a Foley balloon catheter in place. Small amount of gas non dependently in the urinary bladder is iatrogenic. Stomach/Bowel: Nasogastric tube terminating in the proximal antrum of the stomach. The appearance  of the stomach is normal. No pathologic dilatation of small bowel or colon. Numerous colonic diverticulae are noted, without definite surrounding inflammatory changes to suggest an acute diverticulitis at this time. Vascular/Lymphatic: Atherosclerosis throughout  the abdominal and pelvic vasculature, without evidence of aneurysm or dissection. No lymphadenopathy noted in the abdomen or pelvis. Reproductive: Uterus is heterogeneous with multiple coarse calcifications, compatible with calcified fibroids. Ovaries are unremarkable in appearance. Other: Trace volume of ascites, most pronounced adjacent to the liver. Mild diffuse mesenteric edema. No pneumoperitoneum. Musculoskeletal: There are no aggressive appearing lytic or blastic lesions noted in the visualized portions of the skeleton. IMPRESSION: 1. The appearance of the lungs is compatible with severe multilobar bronchopneumonia. 2. Trace right pleural effusion. 3. Trace volume of ascites and mild diffuse mesenteric edema. 4. Atherosclerosis, including left anterior descending coronary artery disease. Please note that although the presence of coronary artery calcium documents the presence of coronary artery disease, the severity of this disease and any potential stenosis cannot be assessed on this non-gated CT examination. Assessment for potential risk factor modification, dietary therapy or pharmacologic therapy may be warranted, if clinically indicated. 5. Colonic diverticulosis without definite findings to suggest an acute diverticulitis at this time. 6. Additional incidental findings, as above. Electronically Signed   By: Vinnie Langton M.D.   On: 03/08/2016 16:06   Ct Abdomen Pelvis W Contrast  02/15/2016  CLINICAL DATA:  Shortness of breath. Right lower quadrant abdominal pain since this morning. EXAM: CT ANGIOGRAPHY CHEST CT ABDOMEN AND PELVIS WITH CONTRAST TECHNIQUE: Multidetector CT imaging of the chest was performed using the standard protocol during bolus administration of intravenous contrast. Multiplanar CT image reconstructions and MIPs were obtained to evaluate the vascular anatomy. Multidetector CT imaging of the abdomen and pelvis was performed using the standard protocol during bolus administration of  intravenous contrast. CONTRAST:  100 mL Isovue 370 intravenous COMPARISON:  06/02/2013 FINDINGS: CTA CHEST FINDINGS Cardiovascular: There is good opacification of the pulmonary arteries. There is no pulmonary embolism. The thoracic aorta is normal in caliber and intact. Lungs: No confluent consolidation. 6 mm nodule at the lateral aspect of the right lower lobe on axial image 79 series 6. This is new from 10/07/2015 and likely inflammatory. The peripheral opacities in the left lower lobe observed on our prior study have resolved. There also is a calcified granuloma in the right upper lobe at the level of the carina. Central airways: Patent Effusions: None Lymphadenopathy: None Esophagus: Unremarkable Musculoskeletal: No significant abnormality CT ABDOMEN and PELVIS FINDINGS Hepatobiliary: There are normal appearances of the liver, gallbladder and bile ducts. Pancreas: Normal Spleen: Normal Adrenals/Urinary Tract: Benign appearing thickening of both adrenals, unchanged from 06/02/2013. Multiple 1.5-2.0 cm hypodensities of the kidneys, likely cysts, not significantly changed. Collecting systems, ureters and urinary bladder appear unremarkable. Stomach/Bowel: Stomach and small bowel are unremarkable. Moderate colonic diverticulosis. No evidence of acute diverticulitis or other acute inflammatory process. No bowel obstruction. Oral contrast has progressed through to the cecum. Appendix is normal. Vascular/Lymphatic: The abdominal aorta is normal in caliber. There is mild atherosclerotic calcification. There is no adenopathy in the abdomen or pelvis. Reproductive: Multiple small uterine fibroids. Ovaries are unremarkable. Other: No acute findings are evident in the abdomen or pelvis. There is no ascites. Musculoskeletal: No significant musculoskeletal lesion. Review of the MIP images confirms the above findings. IMPRESSION: 1. Negative for acute pulmonary embolism. 2. Mild nodularity in the lung bases, with resolution of  nodular opacities observed in November and development of a new  6 mm right lower lobe nodule. This is most likely inflammatory given the interval changes. 3. Normal appendix. 4. No acute findings are evident in the abdomen or pelvis. 5. Diverticulosis. 6. Multiple small uterine fibroids. Electronically Signed   By: Andreas Newport M.D.   On: 02/15/2016 21:43   Dg Chest Port 1 View  03/06/2016  CLINICAL DATA:  PICC line placement. EXAM: PORTABLE CHEST 1 VIEW COMPARISON:  Earlier the same date and 03/05/2016. FINDINGS: 1839 hours. Interval right arm PICC placement, tip extending to the mid SVC level. The endotracheal and nasogastric tubes appear unchanged. The heart size and mediastinal contours are stable. There is interval improved aeration of the lung bases. No pneumothorax or significant pleural effusion seen. IMPRESSION: Right arm PICC extends to the mid SVC level. Interval mildly improved basilar aeration. Electronically Signed   By: Richardean Sale M.D.   On: 03/06/2016 19:01   Dg Chest Port 1 View  03/06/2016  CLINICAL DATA:  Acute respiratory distress EXAM: PORTABLE CHEST 1 VIEW COMPARISON:  03/05/2016 FINDINGS: Endotracheal tube and NG tube again identified. Tip of NG tube not visible due to technical factors. Heart size and vascular pattern normal. Mild opacity left base appears most consistent with atelectasis. IMPRESSION: Mild left base atelectasis Electronically Signed   By: Skipper Cliche M.D.   On: 03/06/2016 07:52   Dg Chest Port 1 View  03/05/2016  CLINICAL DATA:  Acute respiratory failure. Worsening shortness of breath. COPD. EXAM: PORTABLE CHEST 1 VIEW COMPARISON:  03/04/2016 FINDINGS: Endotracheal tube and nasogastric tube remain in appropriate position. The heart size and mediastinal contours are within normal limits. Both lungs are clear. The visualized skeletal structures are unremarkable. IMPRESSION: Endotracheal tube and nasogastric tube in appropriate position. No active disease.  Electronically Signed   By: Earle Gell M.D.   On: 03/05/2016 10:08   Dg Chest Port 1 View  03/04/2016  CLINICAL DATA:  Acute respiratory failure.  Subsequent encounter. EXAM: PORTABLE CHEST 1 VIEW COMPARISON:  03/02/2016 FINDINGS: Lungs mildly hyperexpanded. No evidence of pneumonia or pulmonary edema. No convincing pleural effusion and no pneumothorax. Endotracheal tube and oral/ nasogastric tube are stable in well positioned. IMPRESSION: 1. No acute findings in the lungs. 2. Well-positioned support apparatus. 3. No change from the previous day's study. Electronically Signed   By: Lajean Manes M.D.   On: 03/04/2016 07:48   Dg Chest Port 1 View  03/02/2016  CLINICAL DATA:  Acute respiratory failure.  Subsequent encounter. EXAM: PORTABLE CHEST 1 VIEW COMPARISON:  02/29/2016 FINDINGS: Cardiac silhouette is normal in size. Normal mediastinal and hilar contours. Lungs are hyperexpanded. Small stable granuloma noted in the right mid lung. Lungs otherwise clear. No pleural effusion. No pneumothorax. Endotracheal tube is stable in well positioned. Nasogastric tube passes below the diaphragm into the proximal to mid stomach. IMPRESSION: 1. No acute cardiopulmonary disease. 2. Support apparatus is well positioned. Electronically Signed   By: Lajean Manes M.D.   On: 03/02/2016 15:05   Dg Chest Portable 1 View  02/27/2016  CLINICAL DATA:  Respiratory distress since yesterday. Audible wheezing. Dry cough. COPD. Smoker. EXAM: PORTABLE CHEST 1 VIEW COMPARISON:  Radiographs and CT dated 02/15/2016. FINDINGS: Stable right upper lobe calcified granuloma. The 6 mm nodule seen in the right lower lobe on the CT is not visible on the current or previous radiographs. The cardiac silhouette is currently borderline enlarged with a mild increase in size. The upper lung zone pulmonary vasculature remains mildly prominent. Stable mild prominence of  the interstitial markings. The lungs are hyperexpanded. Diffuse osteopenia.  IMPRESSION: 1. Interval borderline cardiomegaly. 2. Stable mild pulmonary vascular congestion and changes of COPD. Electronically Signed   By: Claudie Revering M.D.   On: 02/27/2016 07:47     Assessment and Recommendation  70 y.o. female with severe chronic obstructive pulmonary disease sleep apnea anemia admitted for respiratory distress and sepsis and now oxygenating relatively well on appropriate antibiotic medication management with a wide complex tachycardia and some supraventricular tachycardia resolved at this time with no recurrence after stabilization 1. Furosemide for any pulmonary edema and lower extremity edema as necessary 2. No additional medication management for supraventricular tachycardia and/or wide complex tachycardia which is fully resolved and likely secondary to early sepsis rather than other primary cardiovascular event 3. No further cardiac diagnostics necessary at this time  Signed, Serafina Royals M.D. FACC

## 2016-03-08 NOTE — Progress Notes (Signed)
Nutrition Follow-up  DOCUMENTATION CODES:   Morbid obesity  INTERVENTION:  -Recommend continuing current TF regimen of Vital High Protein at 60 ml/hr; continue to assess -Pt without BM since 02/28/16; recommend further intervention regarding bowel regime; pt with prn order for suppository, recommend giving daily until pt has BM. Discussed during ICU rounds  NUTRITION DIAGNOSIS:   Inadequate oral intake related to acute illness as evidenced by NPO status.  Being addressed via TF  GOAL:   Provide needs based on ASPEN/SCCM guidelines  MONITOR:   Vent status, TF tolerance, Labs, Weight trends, I & O's  REASON FOR ASSESSMENT:   Consult, Ventilator Enteral/tube feeding initiation and management  ASSESSMENT:    70 yo female admitted with acute respiratory failure duw to acute on chronic COPD exacerbation with associated acute bronchitis, initially on Bipap, required intubation on 02/29/16  Patient is currently intubated on ventilator , remains febrile MV: 9.6 L/min Temp (24hrs), Avg:100.3 F (37.9 C), Min:99.7 F (37.6 C), Max:101.1 F (38.4 C)  Pt tolerating Vital High Protein at rate of 60 ml/hr, free water flushes of 200 mL q 8 hours  Diet Order:  Diet NPO time specified  Skin:  Reviewed, no issues  Last BM:  02/28/16   Digestive System: abdomen obese/distended, BS active, constipated  Labs: sodium improved (Na 143)  Meds: insulin drip, fentanyl, 1/2 NS at 50 ml/hr discontinued this AM, lasix, ducolax suppository prn, miralax BID, senokot daily  Height:   Ht Readings from Last 1 Encounters:  03/02/16 5\' 3"  (1.6 m)    Weight:   Wt Readings from Last 1 Encounters:  03/08/16 252 lb 10.4 oz (114.6 kg)    Ideal Body Weight:  52.3 kg  BMI:  Body mass index is 44.77 kg/(m^2).  Estimated Nutritional Needs:   Kcal:  [id number] kcals (11-14 kcals/kg current wt of 102.5 kg for BMI >30 using ASPEN guidelines)  Protein:  >/= 104 g (2.0 g/kg) IBW  Fluid:  >/= 1.5  L  EDUCATION NEEDS:   Education needs no appropriate at this time  [place], [place], [place] [phone] Pager  [phone] Weekend/On-Call Pager

## 2016-03-08 NOTE — Progress Notes (Signed)
[facility] Progress Note Patient Name: Ebony [name] DOB: December 11, 1945 MRN: [id number]   Date of Service  03/08/2016  HPI/Events of Note  ABG on 35%/PRVC 24/TV 450/P 5 = 7.25/84/81/36.0. Ppeak = 45, therefore, can't increase rate. She appears to be dysynchronus with the ventilator. Will try to give order Versed to help with ventilator synchrony.   eICU Interventions  Will observe with PRN sedation.      Intervention Category Major Interventions: Respiratory failure - evaluation and management  Lizzie An Eugene 03/08/2016, 6:05 AM

## 2016-03-08 NOTE — Progress Notes (Addendum)
PULMONARY / CRITICAL CARE MEDICINE   Name: Ebony [name] MRN: [id number] DOB: 08/16/1946    ADMISSION DATE:  02/27/2016  Addendum 4/26, 1:18 PM: Discussed patient's case with the patient's husband and son at bedside. Updated family as to the patient's condition difficulty with weaning ventilator, as, as well as current interventions to try to help her respiratory status. We are about 1 week. Postextubation, and they are considering other.  Expressed that they have been through this before with another family member, they appear to know where this is heading but they would like to give her every possible chance. They asked for her "best case scenario" for recovery would be if she had a tracheostomy and went to a facility with a trach and a feeding tube.  I explained to them that it would be approximately 50/50 in a best case scenario. They would like to keep her full code, and are pro-trach if it comes to that. Meanwhile will continue current therapies and will try to wean her off the vent before the two week period next week on Tuesday.   DISCUSSION:  70 year old female with known history of chronic respiratory failure due to COPD, Sleep apnea, hyperlipidemia, now with admitted with AECOPD/Bronchitis, now with persistent fevers, vent dependent respiratory failure.  ASSESSMENT / PLAN  PULMONARY A: Acute on chronic respiratory failure, acute on chronic hypercapnic respiratory failure, likely due to obesity hypoventilation, as well as emphysema with hypoventilation. Review blood gases 7.25/84/81/36.8, consistent with acute on chronic respiratory acidosis. COPD exacerbation with acute bronchiolitis Right lower lobe lung nodule (benign and old) Persistent fevers of uncertain etiology. Review of chest x-ray images today shows continued hyperinflation consistent with emphysema, no new infiltrate is noted. P:   SBT trials in am as tolerated by the patient with goals of extubation Continue  pulmicort/xopnex/ atrovent Continue steroid taper Continue antibiotics.    CARDIOVASCULAR A:  Shock Hypertension, Sinus Tachycardia Hyperlipidemia P:  Diltiazem PRN Continue Simvastatin Hold hyzaar Cardiology consult  We'll await echo results.   RENAL A:  Acute kidne injury - imrpoving Continued edema, we'll increase Lasix frequency. P:  Monitor electrolytes Replace lytes per protocol  GASTROINTESTINAL A:  No active issues P:  Cont tf's Continue GI prophylaxis with H2 blocker.  HEMATOLOGIC A:  Leukocytosis, this may be due to steroids, wean down. P:  lovenox for DVT prophylaxis SCD'S Continue to wean steroids.  INFECTIOUS A:Sepsis  Fever of uncertain etiology Mold in, sputum, possibly Aspergillus, patient has been started empirically on voriconazole. We'll check IgE level as well. -ID service following. Urine positive for Candida P:  - f\u cultures as stated above - abx changed on 4/21  Due to persistent fevers over the past 24 hours ID Consult appreciated, continue antibiotics and antifungals per ID We'll change acetaminophen to scheduled.  ENDOCRINE A:  Diabetese Melitus-blood glucose appears adequately controlled. Hypothyroidism.  P:  ICU hypo/hyperglycemic protocol continue Insulin gtt; will transition to long acting.  Continue synthroid  NEUROLOGIC A:  No active issues P:  Follow CT brain RASS goal:0 -1  ------------------------------------------------   SUBJECTIVE:  Patient remains orally intubated, Patient was febrile overnight.     SIGNIFICANT EVENTS: 4/17>>AECOPD, Bronchitis, requiring continuous Bipap 4/18>>intermitted bipap, bridge with HFNC 02/28/18>> intubated.  4/25>> bronchoscopy, bronchomalacia, diffuse candidemia.    VITAL SIGNS: Temp:  [99.7 F (37.6 C)-101.7 F (38.7 C)] 100.8 F (38.2 C) (04/26 0800) Pulse Rate:  [61-107] 78 (04/26 0800) Resp:  [10-25] 24 (04/26 0800) BP:  (81-154)/(44-74) 130/56  mmHg (04/26 0800) SpO2:  [87 %-100 %] 95 % (04/26 0800) FiO2 (%):  [35 %] 35 % (04/26 0751) Weight:  [252 lb 10.4 oz (114.6 kg)] 252 lb 10.4 oz (114.6 kg) (04/26 0420) HEMODYNAMICS:   VENTILATOR SETTINGS: Vent Mode:  [-] PRVC FiO2 (%):  [35 %] 35 % Set Rate:  [24 bmp] 24 bmp Vt Set:  [450 mL] 450 mL PEEP:  [5 cmH20] 5 cmH20 Plateau Pressure:  [43 cmH20] 43 cmH20 INTAKE / OUTPUT:  Intake/Output Summary (Last 24 hours) at 03/08/16 [id number] Last data filed at 03/08/16 0700  Gross per 24 hour  Intake 3875.64 ml  Output   1525 ml  Net 2350.64 ml    Review of Systems  Unable to perform ROS: critical illness    Physical Exam  Constitutional: No distress.  HENT:  Head: Normocephalic and atraumatic.  Eyes: Pupils are equal, round, and reactive to light. Right eye exhibits no discharge. Left eye exhibits no discharge.  Neck: Normal range of motion. Neck supple. No JVD present. No tracheal deviation present.  Cardiovascular: Normal rate and regular rhythm.   No murmur heard. Pulmonary/Chest: She has no wheezes. She has no rales.  Abdominal: She exhibits distension. There is no tenderness. There is no guarding.  Neurological:  gcs<8T  Skin: Skin is warm. She is not diaphoretic.   on lung auscultation today 4/26 There is no significant change, there is decreased air entry bilaterally.  CBC  Recent Labs Lab 03/06/16 0633 03/07/16 0509 03/08/16 0550  WBC 13.2* 15.3* 15.3*  HGB 10.3* 10.4* 10.2*  HCT 32.2* 32.1* 32.5*  PLT 210 228 247   Coag's No results for input(s): APTT, INR in the last 168 hours. BMET  Recent Labs Lab 03/06/16 0633 03/07/16 0509 03/08/16 0550  NA 148* 148* 143  K 4.1 4.0 4.2  CL 107 108 106  CO2 36* 36* 32  BUN 29* 41* 42*  CREATININE 0.56 0.70 0.69  GLUCOSE 199* 170* 194*   Electrolytes  Recent Labs Lab 03/05/16 0531 03/06/16 0633 03/07/16 0509 03/08/16 0550  CALCIUM 9.2 9.2 9.3 9.2  MG 1.7 1.7  --   --     Sepsis Markers  Recent Labs Lab 03/03/16 0959 03/04/16 0441 03/05/16 0500  PROCALCITON <0.10 <0.10 <0.10   ABG  Recent Labs Lab 03/04/16 0930 03/05/16 0900 03/08/16 0502  PHART 7.37 7.41 7.25*  PCO2ART 64* 64* 84*  PO2ART 92 81* 81*   Liver Enzymes No results for input(s): AST, ALT, ALKPHOS, BILITOT, ALBUMIN in the last 168 hours. Cardiac Enzymes No results for input(s): TROPONINI, PROBNP in the last 168 hours. Glucose  Recent Labs Lab 03/08/16 0253 03/08/16 0358 03/08/16 0452 03/08/16 0554 03/08/16 0648 03/08/16 0759  GLUCAP 132* 154* 189* 174* 165* 140*    Imaging Dg Chest 1 View  03/08/2016  CLINICAL DATA:  Acute respiratory failure, history of COPD -asthma, hypertension, breast malignancy EXAM: CHEST 1 VIEW COMPARISON:  Portable chest x-ray of March 07, 2016 FINDINGS: The lungs remain hyperinflated. There is no pneumothorax or pleural effusion. The interstitial markings are mildly prominent with areas of patchy confluent density bilaterally. The heart is mildly enlarged. The pulmonary vascularity exhibits mild cephalization. The endotracheal tube tip projects 4.6 cm above the carina. The esophagogastric tube tip projects below the inferior margin of the image. The right-sided PICC line tip projects over the midportion of the SVC. IMPRESSION: Fairly stable appearance of the chest allowing for differences in positioning. Persistent left basilar atelectasis or pneumonia. Persistent  areas of patchy parenchymal density in both lungs likely reflecting pneumonia or parenchymal nodules. No pulmonary edema. Electronically Signed   By: David  Martinique M.D.   On: 03/08/2016 08:03   Dg Abd 1 View  03/07/2016  CLINICAL DATA:  No bowel movement for 2 days. EXAM: ABDOMEN - 1 VIEW COMPARISON:  Abdominal radiograph 02/29/2016. FINDINGS: Enteric tube tip and side-port project over the stomach. Lung bases are clear. Markedly limited exam secondary to body habitus. Gas is demonstrated  within nondilated loops of large and small bowel. No definite evidence to suggest obstruction. IMPRESSION: Limited exam due to body habitus. No definite evidence to suggest obstruction. Electronically Signed   By: Lovey Newcomer M.D.   On: 03/07/2016 20:10    STUDIES:  4/4/ 17 >> CT chest was Negative for acute pulmonary embolism.Mild nodularity in the lung bases, with resolution of nodularopacities observed in November and development of a new 6 mm rightlower lobe nodule. This is most likely inflammatory given the interval changes. 03/06/16 CT Brain without contrast.>> 03/06/16 echo >> Chest x-ray 4/26 images reviewed continued hyperinflation consistent with emphysema, continued bibasilar atelectasis compared with previous images, and we'll change.  CULTURES: Blood 4/20>> Sputum 4/20>> Urine 4/20>> Pos for Candida Krusei Bronchoscopy cultures pending.  ANTIBIOTICS: 4/16 levaquin>>4/20  4/20 Zosyn >>4/21 Meropenem 4/21>> Vanc 4/21>>   LINES/TUBES: ET tube. Right femoral line, discontinued 4/24  DISCUSSION:  70 year old female with known history of chronic respiratory failure due to COPD, Sleep apnea, hyperlipidemia, now with admitted with AECOPD/Bronchitis, now with persistent fevers   ASSESSMENT / PLAN  PULMONARY A: Acute on chronic respiratory failure COPD exacerbation with acute bronchiolitis Right lower lobe lung nodule (benign and old) Persistent fevers of uncertain etiology. Review of chest x-ray images today shows continued hyperinflation consistent with emphysema, no new infiltrate is noted. P:   SBT trials in am as tolerated by the patient with goals of extubation Continue pulmicort/xopnex/ atrovent Continue steroid taper Continue antibiotics.    CARDIOVASCULAR A:  Shock Hypertension, Sinus Tachycardia Hyperlipidemia P:  Diltiazem PRN Continue Simvastatin Hold hyzaar Cardiology consult  We'll await echo results.   RENAL A:  Acute kidne  injury - imrpoving P:  Monitor electrolytes Replace lytes per protocol  GASTROINTESTINAL A:  No active issues P:  Cont tf's We'll change PPI to H2 blocker for GI prophylaxis.  HEMATOLOGIC A:  Leukocytosis, this may be due to steroids. P:  lovenox for DVT prophylaxis SCD'S Continue to wean steroids.  INFECTIOUS A:Sepsis  Fever of uncertain etiology Mold in, sputum, possibly Aspergillus, patient has been started empirically on voriconazole. We'll check IgE level. Urine positive for Candida P:  - f\u cultures as stated above - abx changed on 4/21  Due to persistent fevers over the past 24 hours ID Consult appreciated, continue antibiotics and antifungals per ID Stated Motrin for fever  ENDOCRINE A:  Diabetese Melitus-blood glucose appears adequately controlled. Hypothyroidism.  P:  Stop oral hypoglycemics ICU hypo/hyperglycemic protocol continue Insulin gtt Continue synthroid  NEUROLOGIC A:  No active issues P:  Follow CT brain RASS goal:0 -1   -Marda Stalker, M.D.  03/08/2016 Critical Care Attestation.  I have personally obtained a history, examined the patient, evaluated laboratory and imaging results, formulated the assessment and plan and placed orders. The Patient requires high complexity decision making for assessment and support, frequent evaluation and titration of therapies, application of advanced monitoring technologies and extensive interpretation of multiple databases. The patient has critical illness that could lead imminently to  failure of 1 or more organ systems and requires the highest level of physician preparedness to intervene.  Critical Care Time 35 min devoted to patient care services described in this note is 35 minutes and is exclusive of time spent in procedures.

## 2016-03-08 NOTE — Progress Notes (Signed)
Patient transported to CT and back on transport vent without complication.

## 2016-03-08 NOTE — Progress Notes (Signed)
Pharmacy Antimicrobial Note  Ebony [name] is a 70 y.o. female admitted on 02/27/2016 with respiratory failure.  Pharmacy has been consulted for vancomycin, meropenem, and voriconazole dosing.  This is day #11 of antimicrobial therapy. Patient is currently on meropenem (day #6), vancomycin (day #6), and voriconazole (day #3). ID has been consulted and is following.   Plan: Continue vancomycin 1500 mg IV q12h Goal vancomycin trough is 15-20 mcg/mL Vancomycin trough ordered for 4/26 @ 1430, which is prior to the 5th dose and should represent steady state.   Continue meropenem 1 g IV q8h  Continue current orders for voriconazole. Patient already received the two loading doses of 6 mg/kg (430 mg). Continue with current orders for 4 mg/kg (290 mg) IV q12h. Dosed using adjusted body weight since patient is obese.  Per discussion with MD, simvastatin was discontinued and citalopram dose was reduced to 20 mg due to drug-drug interactions with voriconazole.  Height: 5\' 3"  (160 cm) Weight: 252 lb 10.4 oz (114.6 kg) IBW/kg (Calculated) : 52.4  Temp (24hrs), Avg:100.3 F (37.9 C), Min:99.7 F (37.6 C), Max:101.1 F (38.4 C)   Recent Labs Lab 03/04/16 0441 03/05/16 0500 03/05/16 0531 03/06/16 0224 03/06/16 0633 03/07/16 0509 03/08/16 0550 03/08/16 1416  WBC 13.2*  --  10.6  --  13.2* 15.3* 15.3*  --   CREATININE 0.62 0.66 0.57  --  0.56 0.70 0.69  --   VANCOTROUGH  --   --   --  10  --   --   --  26*    Estimated Creatinine Clearance: 83.3 mL/min (by C-G formula based on Cr of 0.69).    Allergies  Allergen Reactions  . Other Hives and Rash    Shellfish. Shellfish Pt reports no allergic reaction to iodine or betadine.  . Shellfish Allergy Hives and Rash   Antimicrobials this admission: levofloxacin 4/16 >> 4/20 meropenem 4/21 >>  Vancomycin 4/21 >> Voriconazole 4/24 >>  Dose adjustments this admission: 4/24 Vancomycin changed from 1500 mg IV q8h to q12h  4/26:  Vanc  trough @ 14:16 = 26 mcg/mL .   Will adjust dose to Vancomycin 1250 mg IV Q12H to start 4/26 @ 20:00.           Will draw next trough before 3rd new dose on 4/27 @ 19:30.   Microbiology results: 4/25 BAL: pending 4/20 BCx: negative 4/20 UCx: Candida krusei  4/20 TA: moderate growth mold 4/17 MRSA PCR: negative  Thank you for allowing pharmacy to be a part of this patient's care.  Orene Desanctis, PharmD Clinical Pharmacist 03/08/2016 3:42 PM

## 2016-03-08 NOTE — Progress Notes (Signed)
Pharmacy Antimicrobial Note  Ebony [name] is a 70 y.o. female admitted on 02/27/2016 with respiratory failure.  Pharmacy has been consulted for vancomycin, meropenem, and voriconazole dosing.  This is day #11 of antimicrobial therapy. Patient is currently on meropenem (day #6), vancomycin (day #6), and voriconazole (day #3). ID has been consulted and is following.   Plan: Continue vancomycin 1500 mg IV q12h Goal vancomycin trough is 15-20 mcg/mL Vancomycin trough ordered for 4/26 @ 1430, which is prior to the 5th dose and should represent steady state.   Continue meropenem 1 g IV q8h  Continue current orders for voriconazole. Patient already received the two loading doses of 6 mg/kg (430 mg). Continue with current orders for 4 mg/kg (290 mg) IV q12h. Dosed using adjusted body weight since patient is obese.  Per discussion with MD, simvastatin was discontinued and citalopram dose was reduced to 20 mg due to drug-drug interactions with voriconazole.  Height: 5\' 3"  (160 cm) Weight: 252 lb 10.4 oz (114.6 kg) IBW/kg (Calculated) : 52.4  Temp (24hrs), Avg:100.2 F (37.9 C), Min:99.7 F (37.6 C), Max:100.9 F (38.3 C)   Recent Labs Lab 03/04/16 0441 03/05/16 0500 03/05/16 0531 03/06/16 0224 03/06/16 0633 03/07/16 0509 03/08/16 0550  WBC 13.2*  --  10.6  --  13.2* 15.3* 15.3*  CREATININE 0.62 0.66 0.57  --  0.56 0.70 0.69  VANCOTROUGH  --   --   --  10  --   --   --     Estimated Creatinine Clearance: 83.3 mL/min (by C-G formula based on Cr of 0.69).    Allergies  Allergen Reactions  . Other Hives and Rash    Shellfish. Shellfish Pt reports no allergic reaction to iodine or betadine.  . Shellfish Allergy Hives and Rash   Antimicrobials this admission: levofloxacin 4/16 >> 4/20 meropenem 4/21 >>  Vancomycin 4/21 >> Voriconazole 4/24 >>  Dose adjustments this admission: 4/24 Vancomycin changed from 1500 mg IV q8h to q12h  Microbiology results: 4/25 BAL:  pending 4/20 BCx: negative 4/20 UCx: Candida krusei  4/20 TA: moderate growth mold 4/17 MRSA PCR: negative  Thank you for allowing pharmacy to be a part of this patient's care.  Ulice Dash D, PharmD Clinical Pharmacist 03/08/2016 11:10 AM

## 2016-03-09 ENCOUNTER — Inpatient Hospital Stay: Payer: Medicare Other

## 2016-03-09 LAB — BASIC METABOLIC PANEL
Anion gap: 6 (ref 5–15)
BUN: 32 mg/dL — AB (ref 6–20)
CHLORIDE: 106 mmol/L (ref 101–111)
CO2: 38 mmol/L — ABNORMAL HIGH (ref 22–32)
CREATININE: 0.65 mg/dL (ref 0.44–1.00)
Calcium: 10 mg/dL (ref 8.9–10.3)
GFR calc Af Amer: >60 mL/min (ref >60)
GFR calc non Af Amer: >60 mL/min (ref >60)
GLUCOSE: 213 mg/dL — AB (ref 65–99)
Potassium: 4.2 mmol/L (ref 3.5–5.1)
SODIUM: 150 mmol/L — AB (ref 135–145)

## 2016-03-09 LAB — GLUCOSE, CAPILLARY
GLUCOSE-CAPILLARY: 196 mg/dL — AB (ref 65–99)
GLUCOSE-CAPILLARY: 197 mg/dL — AB (ref 65–99)
GLUCOSE-CAPILLARY: 175 mg/dL — AB (ref 65–99)
GLUCOSE-CAPILLARY: 212 mg/dL — AB (ref 65–99)
GLUCOSE-CAPILLARY: 158 mg/dL — AB (ref 65–99)
GLUCOSE-CAPILLARY: 152 mg/dL — AB (ref 65–99)
GLUCOSE-CAPILLARY: 151 mg/dL — AB (ref 65–99)
GLUCOSE-CAPILLARY: 178 mg/dL — AB (ref 65–99)
Glucose-Capillary: 221 mg/dL — ABNORMAL HIGH (ref 65–99)
Glucose-Capillary: 171 mg/dL — ABNORMAL HIGH (ref 65–99)
Glucose-Capillary: 188 mg/dL — ABNORMAL HIGH (ref 65–99)

## 2016-03-09 LAB — BLOOD GAS, ARTERIAL
ALLENS TEST (PASS/FAIL): POSITIVE — AB
Acid-Base Excess: 13 mmol/L — ABNORMAL HIGH (ref 0.0–3.0)
BICARBONATE: 42.2 meq/L — AB (ref 21.0–28.0)
FIO2: 0.35
MECHANICAL RATE: 24
MECHVT: 450 mL
O2 Saturation: 94.3 %
PEEP/CPAP: 5 cmH2O
PO2 ART: 78 mmHg — AB (ref 83.0–108.0)
Patient temperature: 37
pCO2 arterial: 82 mmHg (ref 32.0–48.0)
pH, Arterial: 7.32 — ABNORMAL LOW (ref 7.350–7.450)

## 2016-03-09 LAB — CBC
HCT: 32.4 % — ABNORMAL LOW (ref 35.0–47.0)
HEMOGLOBIN: 10.1 g/dL — AB (ref 12.0–16.0)
MCH: 29.6 pg (ref 26.0–34.0)
MCHC: 31.3 g/dL — AB (ref 32.0–36.0)
MCV: 94.6 fL (ref 80.0–100.0)
PLATELETS: 284 10*3/uL (ref 150–440)
RBC: 3.42 MIL/uL — ABNORMAL LOW (ref 3.80–5.20)
RDW: 13.7 % (ref 11.5–14.5)
WBC: 14.1 10*3/uL — ABNORMAL HIGH (ref 3.6–11.0)

## 2016-03-09 LAB — CULTURE, RESPIRATORY W GRAM STAIN

## 2016-03-09 LAB — VANCOMYCIN, TROUGH: VANCOMYCIN TR: 18 ug/mL (ref 10–20)

## 2016-03-09 LAB — CULTURE, RESPIRATORY

## 2016-03-09 MED ORDER — INSULIN GLARGINE 100 UNIT/ML ~~LOC~~ SOLN
25.0000 [IU] | SUBCUTANEOUS | Status: DC
  Filled 2016-03-09: qty 0.25

## 2016-03-09 MED ORDER — POLYETHYLENE GLYCOL 3350 17 G PO PACK
17.0000 g | PACK | Freq: Every day | ORAL | Status: DC | PRN
  Filled 2016-03-09: qty 1

## 2016-03-09 MED ORDER — INSULIN GLARGINE 100 UNIT/ML ~~LOC~~ SOLN
5.0000 [IU] | Freq: Once | SUBCUTANEOUS | Status: AC
  Administered 2016-03-09: 5 [IU] via SUBCUTANEOUS
  Filled 2016-03-09: qty 0.05

## 2016-03-09 MED ORDER — SODIUM CHLORIDE 0.9 % IV SOLN
INTRAVENOUS | Status: DC
  Administered 2016-03-09: 1 [IU]/h via INTRAVENOUS
  Filled 2016-03-09: qty 2.5

## 2016-03-09 MED ORDER — FREE WATER
200.0000 mL | Freq: Four times a day (QID) | Status: DC
  Administered 2016-03-09 – 2016-03-10 (×4): 200 mL

## 2016-03-09 NOTE — Progress Notes (Signed)
Pharmacy Antimicrobial Note  Ebony [name] is a 70 y.o. female admitted on 02/27/2016 with respiratory failure.  Pharmacy has been consulted for vancomycin, meropenem, and voriconazole dosing.  This is day #12 of antimicrobial therapy. Patient is currently on meropenem (day #7), vancomycin (day #7), and voriconazole (day #4). ID has been consulted and is following.   Plan: Vancomycin trough above goal 4/26 at 26 mcg/ml. Vancomycin dose decreased to 1250 mg vi q 12 hours. Next trough scheduled with third dose of this new regimen tonight at [place] which will not be at steady-state. Goal vancomycin trough 15-20 mcg/ml.   Continue meropenem 1 g IV q8h  Continue current orders for voriconazole. Patient already received the two loading doses of 6 mg/kg (430 mg). Continue with current orders for 4 mg/kg (290 mg) IV q12h. Dosed using adjusted body weight since patient is obese.  Per discussion with MD, simvastatin was discontinued and citalopram dose was reduced to 20 mg due to drug-drug interactions with voriconazole.  Height: 5\' 3"  (160 cm) Weight: 257 lb 0.9 oz (116.6 kg) IBW/kg (Calculated) : 52.4  Temp (24hrs), Avg:100.3 F (37.9 C), Min:99.8 F (37.7 C), Max:100.9 F (38.3 C)   Recent Labs Lab 03/05/16 0531 03/06/16 0224 03/06/16 0633 03/07/16 0509 03/08/16 0550 03/08/16 1416 03/09/16 0429  WBC 10.6  --  13.2* 15.3* 15.3*  --  14.1*  CREATININE 0.57  --  0.56 0.70 0.69  --  0.65  VANCOTROUGH  --  10  --   --   --  26*  --     Estimated Creatinine Clearance: 84.1 mL/min (by C-G formula based on Cr of 0.65).    Allergies  Allergen Reactions  . Other Hives and Rash    Shellfish. Shellfish Pt reports no allergic reaction to iodine or betadine.  . Shellfish Allergy Hives and Rash   Antimicrobials this admission: levofloxacin 4/16 >> 4/20 meropenem 4/21 >>  Vancomycin 4/21 >> Voriconazole 4/24 >>  Dose adjustments this admission: 4/24 Vancomycin changed from 1500 mg IV  q8h to q12h 4/26 vancomycin decreased to 1250 mg iv q 12 h due to trough of 26 mcg/ml  Microbiology results: 4/25 BAL: pending 4/20 BCx: negative 4/20 UCx: Candida krusei  4/20 TA: moderate growth mold 4/17 MRSA PCR: negative  Thank you for allowing pharmacy to be a part of this patient's care.  Ulice Dash D, PharmD Clinical Pharmacist 03/09/2016 11:26 AM

## 2016-03-09 NOTE — Progress Notes (Signed)
Pharmacy Antimicrobial Note  Ebony [name] is a 70 y.o. female admitted on 02/27/2016 with respiratory failure.  Pharmacy has been consulted for vancomycin, meropenem, and voriconazole dosing.  This is day #12 of antimicrobial therapy. Patient is currently on meropenem (day #7), vancomycin (day #7), and voriconazole (day #4). ID has been consulted and is following.   Plan: Vancomycin trough 18 mcg/mL. As noted previously this is not at steady state. Continue current dose, will recheck vancomycin trough 03/11/16 before AM dose.   Height: 5\' 3"  (160 cm) Weight: 257 lb 0.9 oz (116.6 kg) IBW/kg (Calculated) : 52.4  Temp (24hrs), Avg:100.6 F (38.1 C), Min:99.8 F (37.7 C), Max:101.3 F (38.5 C)   Recent Labs Lab 03/05/16 0531  03/06/16 0633 03/07/16 0509 03/08/16 0550 03/08/16 1416 03/09/16 0429 03/09/16 1949  WBC 10.6  --  13.2* 15.3* 15.3*  --  14.1*  --   CREATININE 0.57  --  0.56 0.70 0.69  --  0.65  --   VANCOTROUGH  --   < >  --   --   --  26*  --  18  < > = values in this interval not displayed.  Estimated Creatinine Clearance: 84.1 mL/min (by C-G formula based on Cr of 0.65).    Allergies  Allergen Reactions  . Other Hives and Rash    Shellfish. Shellfish Pt reports no allergic reaction to iodine or betadine.  . Shellfish Allergy Hives and Rash   Antimicrobials this admission: levofloxacin 4/16 >> 4/20 meropenem 4/21 >>  Vancomycin 4/21 >> Voriconazole 4/24 >>  Dose adjustments this admission: 4/24 Vancomycin changed from 1500 mg IV q8h to q12h 4/26 vancomycin decreased to 1250 mg iv q 12 h due to trough of 26 mcg/ml  Microbiology results: 4/25 BAL: pending 4/20 BCx: negative 4/20 UCx: Candida krusei  4/20 TA: moderate growth mold 4/17 MRSA PCR: negative  Thank you for allowing pharmacy to be a part of this patient's care.  Laural Benes, Pharm.D., BCPS Clinical Pharmacist 03/09/2016 11:24 PM

## 2016-03-09 NOTE — Progress Notes (Signed)
ID e note Remains febrile. CT shows patchy infiltrates- ? Septic emboli  Would recommend continue current vanoc, mero and voriconazile Agree with TEE  I have ordered repeat BCX

## 2016-03-09 NOTE — Progress Notes (Addendum)
Inpatient Diabetes Program Recommendations  AACE/ADA: New Consensus Statement on Inpatient Glycemic Control (2015)  Target Ranges:  Prepandial:   less than 140 mg/dL      Peak postprandial:   less than 180 mg/dL (1-2 hours)      Critically ill patients:  140 - 180 mg/dL   Review of Glycemic Control  Results for Ebony [name], Ebony [name] (MRN [id number]) as of 03/09/2016 07:35  Ref. Range 03/08/2016 19:35 03/08/2016 23:26 03/09/2016 03:54 03/09/2016 04:28 03/09/2016 07:17  Glucose-Capillary Latest Ref Range: 65-99 mg/dL 195 (H) 186 (H) 196 (H) 197 (H) 175 (H)     Current orders for Inpatient glycemic control: Lantus 20 units qday, Novolog 3 units q4h, Novolog correction 2-6 units q4h  Inpatient Diabetes Program Recommendations: Patient transferred to Phase 3 of the ICU Glycemic Control Phase 3 - as a reminder, Lantus needs to be given 2 hours before patient is removed from the IV insulin.    Fasting blood sugars elevated- Consider increasing Lantus to 29 units qday (0.25units/kg)     Nurse can transition back to Phase 2 if...   1. If there is one CBG reading > 250 mg/dL or two subsequent CBG readings > 200 mg/dL switch to ICU Glycemic Control Order Set (Phase II) a. In Order Management select Active Orders and View by Order Set. Discontinue ALL orders form ICU Glycemic Control Order Set (Phase III) per protocol, cosign required. 2. In Order Management under Order Sets search for ICU Glycemic Control Order Set (Phase II). Initiate ALL orders form ICU Glycemic Control Order Set (Phase II) per protocol, cosign required.  Gentry Fitz, RN, BA, MHA, CDE Diabetes Coordinator Inpatient Diabetes Program  [phone] (Team Pager) [phone] ([place]) 03/09/2016 7:53 AM

## 2016-03-09 NOTE — Progress Notes (Signed)
Brief Nutrition Note  Per Dr. Juanell Fairly in ICU rounds wanting to increase free water flush from 267ml q 8 hr to 27ml q 6 hr secondary to elevated Na of 150 today.  +BM noted Tolerating vital high protein at goal rate of 42ml/hr   Admitting Dx: Acute respiratory failure with hypoxia (HCC) [J96.01] COPD with exacerbation (HCC) [J44.1]  Body mass index is 45.55 kg/(m^2).   Labs:   Recent Labs Lab 03/05/16 0531 03/06/16 0633 03/07/16 0509 03/08/16 0550 03/09/16 0429  NA 148* 148* 148* 143 150*  K 4.0 4.1 4.0 4.2 4.2  CL 108 107 108 106 106  CO2 34* 36* 36* 32 38*  BUN 26* 29* 41* 42* 32*  CREATININE 0.57 0.56 0.70 0.69 0.65  CALCIUM 9.2 9.2 9.3 9.2 10.0  MG 1.7 1.7  --   --   --   GLUCOSE 174* 199* 170* 194* 213*    Jordani Nunn B. Ebony [name], [place], [place] (pager) Weekend/On-Call pager [phone])

## 2016-03-09 NOTE — Progress Notes (Signed)
PULMONARY / CRITICAL CARE MEDICINE   Name: Ebony [name] MRN: [id number] DOB: [birth year]    ADMISSION DATE:  02/27/2016   DISCUSSION:  70 year old female with known history of chronic respiratory failure due to COPD, Sleep apnea, hyperlipidemia, now with admitted with AECOPD/Bronchitis, now with persistent fevers, vent dependent respiratory failure.  ASSESSMENT / PLAN  PULMONARY A: Acute on chronic respiratory failure, acute on chronic hypercapnic respiratory failure, likely due to obesity hypoventilation, as well as emphysema with hypoventilation. Review blood gases 7.32/82/678/42.2 consistent with acute on chronic respiratory acidosis. COPD exacerbation with acute bronchiolitis Right lower lobe lung nodule (benign and old) Persistent fevers of uncertain etiology. Review of chest x-ray images today shows continued hyperinflation consistent with emphysema, no new infiltrate is noted. Candidemia.  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  Review of CT chest images 4/27; multifocal infiltrates/nodular in appearance; embolic infection? P:   SBT trials in am as tolerated by the patient with goals of extubation Continue pulmicort/xopnex/ atrovent Continue steroid taper Continue antibiotics, ID following.     CARDIOVASCULAR A:  Shock Hypertension, Sinus Tachycardia Hyperlipidemia P:  Diltiazem PRN Continue Simvastatin Hold hyzaar Cardiology consult  Echo results 4/25; EF 65%; no vegetations noted but study was technically difficult.    RENAL A:  Acute kidne injury - imrpoving Continued edema, continue Lasix. Hypernatremia. P:  Monitor electrolytes Replace lytes per protocol Increase free water replacement today.   GASTROINTESTINAL A:  No active issues P:  Cont tf's Continue GI prophylaxis with H2 blocker.  HEMATOLOGIC A:  Leukocytosis, this may be due to steroids, wean down. P:  lovenox for DVT prophylaxis SCD'S Wean  off steroids.   INFECTIOUS A:Sepsis  Fever of uncertain etiology Mold in, sputum, possibly Aspergillus, patient has been started empirically on voriconazole. We'll check IgE level as well. -ID service following. Urine positive for Candida P:  - f\u cultures as stated above - abx changed on 4/21  Due to persistent fevers over the past 24 hours ID Consult appreciated, continue antibiotics and antifungals per ID We'll change acetaminophen to scheduled.  ENDOCRINE A:  Diabetese Melitus-blood glucose appears adequately controlled. Hypothyroidism.  P:  ICU hypo/hyperglycemic protocol continue Insulin gtt; will transition to long acting.  Continue synthroid  NEUROLOGIC A:  No active issues P:  Follow CT brain RASS goal:0 -1   STUDIES:  4/4/ 17 >> CT chest was Negative for acute pulmonary embolism.Mild nodularity in the lung bases, with resolution of nodularopacities observed in November and development of a new 6 mm rightlower lobe nodule. This is most likely inflammatory given the interval changes. 03/06/16 CT Brain without contrast.>> 03/06/16 echo >> Chest x-ray 4/26 images reviewed continued hyperinflation consistent with emphysema, continued bibasilar atelectasis compared with previous images, and we'll change. --Bronchoscopy 4/25; candidemia throughout airways. Tracheobronchomalacia.  --Echo results 4/25; EF 65%; no vegetations noted but study was technically difficult.    CULTURES: Blood 4/20>> Sputum 4/20>> Urine 4/20>> Pos for Candida Krusei Bronchoscopy cultures pending.  ANTIBIOTICS: 4/16 levaquin>>4/20  4/20 Zosyn >>4/21 Meropenem 4/21>> Vanc 4/21>> Voriconazole 4/25>>   LINES/TUBES: ET tube. Right femoral line, discontinued 4/24 Right picc line 4/25>> ------------------------------------------------   SUBJECTIVE:  Patient remains orally intubated, Patient was febrile overnight.     SIGNIFICANT EVENTS: 4/17>>AECOPD, Bronchitis,  requiring continuous Bipap 4/18>>intermitted bipap, bridge with HFNC 02/28/18>> intubated.  4/25>> bronchoscopy, bronchomalacia, diffuse candidemia.    VITAL SIGNS: Temp:  [99.8 F (37.7 C)-101.1 F (38.4 C)] 100.2 F (37.9 C) (04/27 0800) Pulse Rate:  [  73-109] 102 (04/27 0800) Resp:  [19-29] 26 (04/27 0800) BP: (114-166)/(46-66) 166/65 mmHg (04/27 0800) SpO2:  [91 %-99 %] 97 % (04/27 0800) FiO2 (%):  [35 %] 35 % (04/27 0714) Weight:  [257 lb 0.9 oz (116.6 kg)] 257 lb 0.9 oz (116.6 kg) (04/27 0500) HEMODYNAMICS:   VENTILATOR SETTINGS: Vent Mode:  [-] PRVC FiO2 (%):  [35 %] 35 % Set Rate:  [24 bmp] 24 bmp Vt Set:  [450 mL] 450 mL PEEP:  [5 cmH20] 5 cmH20 Plateau Pressure:  [23 cmH20-30 cmH20] 23 cmH20 INTAKE / OUTPUT:  Intake/Output Summary (Last 24 hours) at 03/09/16 0919 Last data filed at 03/09/16 [id number]  Gross per 24 hour  Intake   4298 ml  Output   3400 ml  Net    898 ml    Review of Systems  Unable to perform ROS: critical illness    Physical Exam  Constitutional: No distress.  HENT:  Head: Normocephalic and atraumatic.  Eyes: Pupils are equal, round, and reactive to light. Right eye exhibits no discharge. Left eye exhibits no discharge.  Neck: Normal range of motion. Neck supple. No JVD present. No tracheal deviation present.  Cardiovascular: Normal rate and regular rhythm.   No murmur heard. Pulmonary/Chest: She has no wheezes. She has no rales.  Abdominal: She exhibits distension. There is no tenderness. There is no guarding.  Neurological:  gcs<8T  Skin: Skin is warm. She is not diaphoretic.   on lung auscultation today 4/26 There is no significant change, there is decreased air entry bilaterally.  CBC  Recent Labs Lab 03/07/16 0509 03/08/16 0550 03/09/16 0429  WBC 15.3* 15.3* 14.1*  HGB 10.4* 10.2* 10.1*  HCT 32.1* 32.5* 32.4*  PLT 228 247 284   Coag's No results for input(s): APTT, INR in the last 168 hours. BMET  Recent Labs Lab  03/07/16 0509 03/08/16 0550 03/09/16 0429  NA 148* 143 150*  K 4.0 4.2 4.2  CL 108 106 106  CO2 36* 32 38*  BUN 41* 42* 32*  CREATININE 0.70 0.69 0.65  GLUCOSE 170* 194* 213*   Electrolytes  Recent Labs Lab 03/05/16 0531 03/06/16 0633 03/07/16 0509 03/08/16 0550 03/09/16 0429  CALCIUM 9.2 9.2 9.3 9.2 10.0  MG 1.7 1.7  --   --   --    Sepsis Markers  Recent Labs Lab 03/03/16 0959 03/04/16 0441 03/05/16 0500  PROCALCITON <0.10 <0.10 <0.10   ABG  Recent Labs Lab 03/05/16 0900 03/08/16 0502 03/09/16 0401  PHART 7.41 7.25* 7.32*  PCO2ART 64* 84* 82*  PO2ART 81* 81* 78*   Liver Enzymes No results for input(s): AST, ALT, ALKPHOS, BILITOT, ALBUMIN in the last 168 hours. Cardiac Enzymes No results for input(s): TROPONINI, PROBNP in the last 168 hours. Glucose  Recent Labs Lab 03/08/16 1608 03/08/16 1935 03/08/16 2326 03/09/16 0354 03/09/16 0428 03/09/16 0717  GLUCAP 171* 195* 186* 196* 197* 175*    Imaging Dg Chest 1 View  03/09/2016  CLINICAL DATA:  Shortness of breath. EXAM: CHEST 1 VIEW COMPARISON:  03/08/2016.  CT 03/08/2016. FINDINGS: Endotracheal tube, NG tube, right PICC line in stable position. Heart size stable. Persistent but partially clearing bilateral multifocal pulmonary infiltrates. Tiny left pleural effusion cannot be excluded. No pneumothorax. IMPRESSION: 1. Lines and tubes in stable position. 2. Persistent but partially clearing bilateral multifocal pulmonary infiltrates. Tiny left pleural effusion cannot be excluded . Electronically Signed   By: Marcello Moores  Register   On: 03/09/2016 07:04   Ct Chest W  Contrast  03/08/2016  CLINICAL DATA:  70 year old female with history of chronic respiratory failure due to COPD. Sleep apnea and hyperlipidemia. Currently admitted with acute COPD exacerbation with persistent fevers and ventilatory dependent respiratory failure. EXAM: CT CHEST, ABDOMEN, AND PELVIS WITH CONTRAST TECHNIQUE: Multidetector CT imaging  of the chest, abdomen and pelvis was performed following the standard protocol during bolus administration of intravenous contrast. CONTRAST:  100 mL of Isovue-300. COMPARISON:  CT of the chest, abdomen and pelvis 02/15/2016. FINDINGS: CT CHEST FINDINGS Mediastinum/Lymph Nodes: Within tracheal to approximately 4.5 cm above the carina. Right upper extremity PICC with tip terminating at the superior cavoatrial junction. Nasogastric to extending into the stomach. Heart size is normal. There is no significant pericardial fluid, thickening or pericardial calcification. Calcifications of the inferior mitral annulus. There is atherosclerosis of the thoracic aorta, the great vessels of the mediastinum and the coronary arteries, including calcified atherosclerotic plaque in the left anterior descending coronary arteries. No pathologically enlarged mediastinal or hilar lymph nodes. Esophagus is unremarkable in appearance. No axillary lymphadenopathy. Lungs/Pleura: Compared to the prior study there is now extensive patchy peribronchovascular airspace consolidation which is somewhat nodular in appearance, compatible with severe multilobar bronchopneumonia. This is most confluent in the posterior aspect of the right lower lobe. Trace right pleural effusion. Musculoskeletal/Soft Tissues: There are no aggressive appearing lytic or blastic lesions noted in the visualized portions of the skeleton. CT ABDOMEN AND PELVIS FINDINGS Hepatobiliary: No cystic or solid hepatic lesions. No intra or extrahepatic biliary ductal dilatation. Gallbladder is normal in appearance. Pancreas: No pancreatic mass. No pancreatic ductal dilatation. No pancreatic or peripancreatic fluid or inflammatory changes. Spleen: Unremarkable. Adrenals/Urinary Tract: Right adrenal gland is normal in appearance. Diffusely thickened left adrenal gland is unchanged. Multiple sub cm low-attenuation lesions are noted in the kidneys bilaterally, too small to definitively  characterize, but favored to represent tiny cysts. There are other larger low-attenuation lesions which do not enhance in the kidneys bilaterally, largest of which is 2.1 cm in the lower pole of the left kidney, compatible with simple cysts. No hydroureteronephrosis. Urinary bladder is nearly completely decompressed, with a Foley balloon catheter in place. Small amount of gas non dependently in the urinary bladder is iatrogenic. Stomach/Bowel: Nasogastric tube terminating in the proximal antrum of the stomach. The appearance of the stomach is normal. No pathologic dilatation of small bowel or colon. Numerous colonic diverticulae are noted, without definite surrounding inflammatory changes to suggest an acute diverticulitis at this time. Vascular/Lymphatic: Atherosclerosis throughout the abdominal and pelvic vasculature, without evidence of aneurysm or dissection. No lymphadenopathy noted in the abdomen or pelvis. Reproductive: Uterus is heterogeneous with multiple coarse calcifications, compatible with calcified fibroids. Ovaries are unremarkable in appearance. Other: Trace volume of ascites, most pronounced adjacent to the liver. Mild diffuse mesenteric edema. No pneumoperitoneum. Musculoskeletal: There are no aggressive appearing lytic or blastic lesions noted in the visualized portions of the skeleton. IMPRESSION: 1. The appearance of the lungs is compatible with severe multilobar bronchopneumonia. 2. Trace right pleural effusion. 3. Trace volume of ascites and mild diffuse mesenteric edema. 4. Atherosclerosis, including left anterior descending coronary artery disease. Please note that although the presence of coronary artery calcium documents the presence of coronary artery disease, the severity of this disease and any potential stenosis cannot be assessed on this non-gated CT examination. Assessment for potential risk factor modification, dietary therapy or pharmacologic therapy may be warranted, if clinically  indicated. 5. Colonic diverticulosis without definite findings to suggest an acute diverticulitis at  this time. 6. Additional incidental findings, as above. Electronically Signed   By: Vinnie Langton M.D.   On: 03/08/2016 16:06   Ct Abdomen Pelvis W Contrast  03/08/2016  CLINICAL DATA:  70 year old female with history of chronic respiratory failure due to COPD. Sleep apnea and hyperlipidemia. Currently admitted with acute COPD exacerbation with persistent fevers and ventilatory dependent respiratory failure. EXAM: CT CHEST, ABDOMEN, AND PELVIS WITH CONTRAST TECHNIQUE: Multidetector CT imaging of the chest, abdomen and pelvis was performed following the standard protocol during bolus administration of intravenous contrast. CONTRAST:  100 mL of Isovue-300. COMPARISON:  CT of the chest, abdomen and pelvis 02/15/2016. FINDINGS: CT CHEST FINDINGS Mediastinum/Lymph Nodes: Within tracheal to approximately 4.5 cm above the carina. Right upper extremity PICC with tip terminating at the superior cavoatrial junction. Nasogastric to extending into the stomach. Heart size is normal. There is no significant pericardial fluid, thickening or pericardial calcification. Calcifications of the inferior mitral annulus. There is atherosclerosis of the thoracic aorta, the great vessels of the mediastinum and the coronary arteries, including calcified atherosclerotic plaque in the left anterior descending coronary arteries. No pathologically enlarged mediastinal or hilar lymph nodes. Esophagus is unremarkable in appearance. No axillary lymphadenopathy. Lungs/Pleura: Compared to the prior study there is now extensive patchy peribronchovascular airspace consolidation which is somewhat nodular in appearance, compatible with severe multilobar bronchopneumonia. This is most confluent in the posterior aspect of the right lower lobe. Trace right pleural effusion. Musculoskeletal/Soft Tissues: There are no aggressive appearing lytic or  blastic lesions noted in the visualized portions of the skeleton. CT ABDOMEN AND PELVIS FINDINGS Hepatobiliary: No cystic or solid hepatic lesions. No intra or extrahepatic biliary ductal dilatation. Gallbladder is normal in appearance. Pancreas: No pancreatic mass. No pancreatic ductal dilatation. No pancreatic or peripancreatic fluid or inflammatory changes. Spleen: Unremarkable. Adrenals/Urinary Tract: Right adrenal gland is normal in appearance. Diffusely thickened left adrenal gland is unchanged. Multiple sub cm low-attenuation lesions are noted in the kidneys bilaterally, too small to definitively characterize, but favored to represent tiny cysts. There are other larger low-attenuation lesions which do not enhance in the kidneys bilaterally, largest of which is 2.1 cm in the lower pole of the left kidney, compatible with simple cysts. No hydroureteronephrosis. Urinary bladder is nearly completely decompressed, with a Foley balloon catheter in place. Small amount of gas non dependently in the urinary bladder is iatrogenic. Stomach/Bowel: Nasogastric tube terminating in the proximal antrum of the stomach. The appearance of the stomach is normal. No pathologic dilatation of small bowel or colon. Numerous colonic diverticulae are noted, without definite surrounding inflammatory changes to suggest an acute diverticulitis at this time. Vascular/Lymphatic: Atherosclerosis throughout the abdominal and pelvic vasculature, without evidence of aneurysm or dissection. No lymphadenopathy noted in the abdomen or pelvis. Reproductive: Uterus is heterogeneous with multiple coarse calcifications, compatible with calcified fibroids. Ovaries are unremarkable in appearance. Other: Trace volume of ascites, most pronounced adjacent to the liver. Mild diffuse mesenteric edema. No pneumoperitoneum. Musculoskeletal: There are no aggressive appearing lytic or blastic lesions noted in the visualized portions of the skeleton. IMPRESSION:  1. The appearance of the lungs is compatible with severe multilobar bronchopneumonia. 2. Trace right pleural effusion. 3. Trace volume of ascites and mild diffuse mesenteric edema. 4. Atherosclerosis, including left anterior descending coronary artery disease. Please note that although the presence of coronary artery calcium documents the presence of coronary artery disease, the severity of this disease and any potential stenosis cannot be assessed on this non-gated CT examination. Assessment for  potential risk factor modification, dietary therapy or pharmacologic therapy may be warranted, if clinically indicated. 5. Colonic diverticulosis without definite findings to suggest an acute diverticulitis at this time. 6. Additional incidental findings, as above. Electronically Signed   By: Vinnie Langton M.D.   On: 03/08/2016 16:06       -Marda Stalker, M.D.  03/09/2016   Critical Care Attestation.  I have personally obtained a history, examined the patient, evaluated laboratory and imaging results, formulated the assessment and plan and placed orders. The Patient requires high complexity decision making for assessment and support, frequent evaluation and titration of therapies, application of advanced monitoring technologies and extensive interpretation of multiple databases. The patient has critical illness that could lead imminently to failure of 1 or more organ systems and requires the highest level of physician preparedness to intervene.  Critical Care Time 35 min devoted to patient care services described in this note is 35 minutes and is exclusive of time spent in procedures.

## 2016-03-09 NOTE — Progress Notes (Signed)
Husband at bedside stating to nurse that post family meeting yesterday, he decided to continue to have aggressive treatment and possibly trach and peg surgery for patient if pt needed it. Husband began asking for information regarding organ donation stating that pt elected to be an organ donor on her license as well as pt and her husband has a previous conversation to where pt stated she would want to be an organ donor. Husband had questions about donation to which nurse stated pt was not at criteria at this point for donation, but can arrange for a representative to speak with him. [facility] called and representative Raj Janus stated she would follow up with Husband. Raj Janus called back stating she had answered all of the Husband's questions but due to pt's recent history of breast cancer, she would be a candidate for donation. Stated if family decided to withdraw care for patient, [facility] would need to be contacted to rule out eyes for after cardiac death. Reference # [id number]. Pt husband stated he did not want pt's children to know he asked about donation because he "was not sure how they would feel about it".

## 2016-03-09 NOTE — Progress Notes (Signed)
Took over patient from Reuben Likes, RN at this time.

## 2016-03-09 NOTE — Progress Notes (Signed)
[facility] Encounter Note  Patient: Ebony [name] / Admit Date: 02/27/2016 / Date of Encounter: 03/09/2016, 12:32 PM   Subjective: Patient has had normal sinus rhythm by telemetry with no evidence of hemodynamic compromise this time. No evidence of new rhythm disturbances or wide complex tachycardia. Patient has persistent fevers and not improving with current condition concerning for other possible causes of infection including endocarditis Echocardiogram shows overall normal LV systolic function although could not assess cardiac valves very well due to poor ultrasound transmission  Review of Systems: Unable to assess due to sedation  Objective: Telemetry: Normal sinus rhythm Physical Exam: Blood pressure 166/65, pulse 102, temperature 100.2 F (37.9 C), temperature source Axillary, resp. rate 26, height 5' 3"  (1.6 m), weight 257 lb 0.9 oz (116.6 kg), SpO2 97 %. Body mass index is 45.55 kg/(m^2). General: Well developed, well nourished, in no acute distress. Head: Normocephalic, atraumatic, sclera non-icteric, no xanthomas, nares are without discharge. Neck: No apparent masses Lungs: Normal respirations with few wheezes, no rhonchi, no rales , some crackles   Heart: Regular rate and rhythm, normal S1 S2, no murmur, no rub, no gallop, PMI is normal size and placement, carotid upstroke normal without bruit, jugular venous pressure normal Abdomen: Soft,  no apparent hepatosplenomegaly Extremities: 1+ edema, no clubbing, no cyanosis, no ulcers,  Peripheral: 2+ radial, 2+ femoral, 2+ dorsal pedal pulses Neuro: Not Alert and oriented. Moves all extremities spontaneously. Psych: Does not Responds to questions appropriately with a normal affect.   Intake/Output Summary (Last 24 hours) at 03/09/16 1232 Last data filed at 03/09/16 0824  Gross per 24 hour  Intake   4298 ml  Output   3100 ml  Net   1198 ml    Inpatient Medications:  . acetaminophen (TYLENOL) oral  liquid 160 mg/5 mL  500 mg Per Tube Q6H  . antiseptic oral rinse  7 mL Mouth Rinse QID  . aspirin  81 mg Per NG tube Daily  . bisacodyl  10 mg Rectal BID  . budesonide (PULMICORT) nebulizer solution  0.25 mg Nebulization 4 times per day  . buPROPion  75 mg Per NG tube BID  . chlorhexidine gluconate (SAGE KIT)  15 mL Mouth Rinse BID  . citalopram  20 mg Per NG tube Daily  . enoxaparin (LOVENOX) injection  40 mg Subcutaneous Q12H  . famotidine  20 mg Oral BID  . free water  200 mL Per Tube Q6H  . furosemide  20 mg Intravenous Q12H  . insulin aspart  2-6 Units Subcutaneous Q4H  . insulin aspart  3 Units Subcutaneous Q4H  . insulin glargine  25 Units Subcutaneous Q24H  . letrozole  2.5 mg Oral Daily  . levothyroxine  200 mcg Per NG tube QAC breakfast  . levothyroxine  50 mcg Oral QAC breakfast  . meropenem (MERREM) IV  1 g Intravenous Q8H  . potassium chloride  40 mEq Oral Daily  . sennosides  5 mL Per Tube Daily  . vancomycin  1,250 mg Intravenous Q12H  . voriconazole  4 mg/kg (Adjusted) Intravenous Q12H   Infusions:  . dextrose    . feeding supplement (VITAL HIGH PROTEIN) 1,000 mL (03/09/16 0700)  . fentaNYL infusion INTRAVENOUS 100 mcg/hr (03/09/16 1102)  . norepinephrine 3 mcg/min (03/04/16 0936)    Labs:  Recent Labs  03/08/16 0550 03/09/16 0429  NA 143 150*  K 4.2 4.2  CL 106 106  CO2 32 38*  GLUCOSE 194* 213*  BUN 42* 32*  CREATININE 0.69 0.65  CALCIUM 9.2 10.0   No results for input(s): AST, ALT, ALKPHOS, BILITOT, PROT, ALBUMIN in the last 72 hours.  Recent Labs  03/08/16 0550 03/09/16 0429  WBC 15.3* 14.1*  HGB 10.2* 10.1*  HCT 32.5* 32.4*  MCV 94.8 94.6  PLT 247 284   No results for input(s): CKTOTAL, CKMB, TROPONINI in the last 72 hours. Invalid input(s): POCBNP No results for input(s): HGBA1C in the last 72 hours.   Weights: Filed Weights   03/07/16 0503 03/08/16 0420 03/09/16 0500  Weight: 225 lb 12 oz (102.4 kg) 252 lb 10.4 oz (114.6 kg) 257  lb 0.9 oz (116.6 kg)     Radiology/Studies:  Dg Chest 1 View  03/09/2016  CLINICAL DATA:  Shortness of breath. EXAM: CHEST 1 VIEW COMPARISON:  03/08/2016.  CT 03/08/2016. FINDINGS: Endotracheal tube, NG tube, right PICC line in stable position. Heart size stable. Persistent but partially clearing bilateral multifocal pulmonary infiltrates. Tiny left pleural effusion cannot be excluded. No pneumothorax. IMPRESSION: 1. Lines and tubes in stable position. 2. Persistent but partially clearing bilateral multifocal pulmonary infiltrates. Tiny left pleural effusion cannot be excluded . Electronically Signed   By: Marcello Moores  Register   On: 03/09/2016 07:04   Dg Chest 1 View  03/08/2016  CLINICAL DATA:  Acute respiratory failure, history of COPD -asthma, hypertension, breast malignancy EXAM: CHEST 1 VIEW COMPARISON:  Portable chest x-ray of March 07, 2016 FINDINGS: The lungs remain hyperinflated. There is no pneumothorax or pleural effusion. The interstitial markings are mildly prominent with areas of patchy confluent density bilaterally. The heart is mildly enlarged. The pulmonary vascularity exhibits mild cephalization. The endotracheal tube tip projects 4.6 cm above the carina. The esophagogastric tube tip projects below the inferior margin of the image. The right-sided PICC line tip projects over the midportion of the SVC. IMPRESSION: Fairly stable appearance of the chest allowing for differences in positioning. Persistent left basilar atelectasis or pneumonia. Persistent areas of patchy parenchymal density in both lungs likely reflecting pneumonia or parenchymal nodules. No pulmonary edema. Electronically Signed   By: David  Martinique M.D.   On: 03/08/2016 08:03   Dg Chest 1 View  03/07/2016  CLINICAL DATA:  Acute respiratory failure, history of COPD, breast malignancy EXAM: CHEST 1 VIEW COMPARISON:  Portable chest x-ray of March 06, 2016 FINDINGS: The lungs are adequately inflated. There are patchy areas of  confluent density in both lungs more conspicuous today. The cardiac silhouette is normal in size. The pulmonary vascularity is not engorged. There is no pleural effusion. The mediastinum is normal in width. The right-sided PICC line tip projects over the proximal third of the SVC. IMPRESSION: Patchy alveolar opacities bilaterally slightly more conspicuous today worrisome for pneumonia. Density at the left lung base medially has improved slightly. There is no evidence of CHF or significant pleural effusion. Electronically Signed   By: David  Martinique M.D.   On: 03/07/2016 07:33   Dg Chest 1 View  02/29/2016  CLINICAL DATA:  Status post intubation today. EXAM: CHEST 1 VIEW COMPARISON:  Single view of the chest 02/27/2016. FINDINGS: New endotracheal tube is in place with the tip in good position just below the clavicular heads. A new NG tube is also identified. The tip of a tube projects over the left lower chest this may be the patient's NG tube. Lungs appear clear. Heart size is normal. No pneumothorax or pleural effusion. IMPRESSION: ET tube in good position. NG tube position is indeterminate. Recommend dedicated plain film  of the abdomen. Clear lungs. These results will be called to the ordering clinician or representative by the Radiologist Assistant, and communication documented in the PACS or zVision Dashboard. Electronically Signed   By: Inge Rise M.D.   On: 02/29/2016 20:02   Dg Chest 2 View  02/15/2016  CLINICAL DATA:  Chest pain, back pain, dizziness, headache and shortness of breath. EXAM: CHEST - 2 VIEW COMPARISON:  10/09/2015 FINDINGS: The heart size and mediastinal contours are within normal limits. Bibasilar atelectasis/scarring present with probable component of underlying chronic lung disease. There is no evidence of pulmonary edema, focal consolidation, pneumothorax, nodule or pleural fluid. The visualized skeletal structures are unremarkable. IMPRESSION: Bibasilar scarring/atelectasis with  probable underlying chronic lung disease. Electronically Signed   By: Aletta Edouard M.D.   On: 02/15/2016 19:20   Dg Abd 1 View  03/07/2016  CLINICAL DATA:  No bowel movement for 2 days. EXAM: ABDOMEN - 1 VIEW COMPARISON:  Abdominal radiograph 02/29/2016. FINDINGS: Enteric tube tip and side-port project over the stomach. Lung bases are clear. Markedly limited exam secondary to body habitus. Gas is demonstrated within nondilated loops of large and small bowel. No definite evidence to suggest obstruction. IMPRESSION: Limited exam due to body habitus. No definite evidence to suggest obstruction. Electronically Signed   By: Lovey Newcomer M.D.   On: 03/07/2016 20:10   Dg Abd 1 View  02/29/2016  CLINICAL DATA:  NG tube placement.  Femoral line placement. EXAM: ABDOMEN - 1 VIEW COMPARISON:  CT 02/15/2016 FINDINGS: Tip and side port of the enteric tube below the diaphragm in the stomach. No dilated bowel loops to suggest obstruction. Air throughout normal caliber small and large bowel. Tip of the right femoral line projects over the region of the common iliac vein. No evidence of acute osseous abnormality. IMPRESSION: 1. Tip and side port of the enteric tube below the diaphragm in the stomach. 2. Tip of the right femoral catheter projects over the expected location of the proximal right common iliac vein. Electronically Signed   By: Jeb Levering M.D.   On: 02/29/2016 20:01   Ct Head Wo Contrast  03/06/2016  CLINICAL DATA:  Difficulty speaking EXAM: CT HEAD WITHOUT CONTRAST TECHNIQUE: Contiguous axial images were obtained from the base of the skull through the vertex without intravenous contrast. COMPARISON:  None. FINDINGS: Bony calvarium is intact. No findings to suggest acute hemorrhage, acute infarction or space-occupying mass lesion are noted. IMPRESSION: No acute abnormality noted. Electronically Signed   By: Inez Catalina M.D.   On: 03/06/2016 15:12   Ct Chest W Contrast  03/08/2016  CLINICAL DATA:   70 year old female with history of chronic respiratory failure due to COPD. Sleep apnea and hyperlipidemia. Currently admitted with acute COPD exacerbation with persistent fevers and ventilatory dependent respiratory failure. EXAM: CT CHEST, ABDOMEN, AND PELVIS WITH CONTRAST TECHNIQUE: Multidetector CT imaging of the chest, abdomen and pelvis was performed following the standard protocol during bolus administration of intravenous contrast. CONTRAST:  100 mL of Isovue-300. COMPARISON:  CT of the chest, abdomen and pelvis 02/15/2016. FINDINGS: CT CHEST FINDINGS Mediastinum/Lymph Nodes: Within tracheal to approximately 4.5 cm above the carina. Right upper extremity PICC with tip terminating at the superior cavoatrial junction. Nasogastric to extending into the stomach. Heart size is normal. There is no significant pericardial fluid, thickening or pericardial calcification. Calcifications of the inferior mitral annulus. There is atherosclerosis of the thoracic aorta, the great vessels of the mediastinum and the coronary arteries, including calcified atherosclerotic plaque  in the left anterior descending coronary arteries. No pathologically enlarged mediastinal or hilar lymph nodes. Esophagus is unremarkable in appearance. No axillary lymphadenopathy. Lungs/Pleura: Compared to the prior study there is now extensive patchy peribronchovascular airspace consolidation which is somewhat nodular in appearance, compatible with severe multilobar bronchopneumonia. This is most confluent in the posterior aspect of the right lower lobe. Trace right pleural effusion. Musculoskeletal/Soft Tissues: There are no aggressive appearing lytic or blastic lesions noted in the visualized portions of the skeleton. CT ABDOMEN AND PELVIS FINDINGS Hepatobiliary: No cystic or solid hepatic lesions. No intra or extrahepatic biliary ductal dilatation. Gallbladder is normal in appearance. Pancreas: No pancreatic mass. No pancreatic ductal dilatation.  No pancreatic or peripancreatic fluid or inflammatory changes. Spleen: Unremarkable. Adrenals/Urinary Tract: Right adrenal gland is normal in appearance. Diffusely thickened left adrenal gland is unchanged. Multiple sub cm low-attenuation lesions are noted in the kidneys bilaterally, too small to definitively characterize, but favored to represent tiny cysts. There are other larger low-attenuation lesions which do not enhance in the kidneys bilaterally, largest of which is 2.1 cm in the lower pole of the left kidney, compatible with simple cysts. No hydroureteronephrosis. Urinary bladder is nearly completely decompressed, with a Foley balloon catheter in place. Small amount of gas non dependently in the urinary bladder is iatrogenic. Stomach/Bowel: Nasogastric tube terminating in the proximal antrum of the stomach. The appearance of the stomach is normal. No pathologic dilatation of small bowel or colon. Numerous colonic diverticulae are noted, without definite surrounding inflammatory changes to suggest an acute diverticulitis at this time. Vascular/Lymphatic: Atherosclerosis throughout the abdominal and pelvic vasculature, without evidence of aneurysm or dissection. No lymphadenopathy noted in the abdomen or pelvis. Reproductive: Uterus is heterogeneous with multiple coarse calcifications, compatible with calcified fibroids. Ovaries are unremarkable in appearance. Other: Trace volume of ascites, most pronounced adjacent to the liver. Mild diffuse mesenteric edema. No pneumoperitoneum. Musculoskeletal: There are no aggressive appearing lytic or blastic lesions noted in the visualized portions of the skeleton. IMPRESSION: 1. The appearance of the lungs is compatible with severe multilobar bronchopneumonia. 2. Trace right pleural effusion. 3. Trace volume of ascites and mild diffuse mesenteric edema. 4. Atherosclerosis, including left anterior descending coronary artery disease. Please note that although the presence  of coronary artery calcium documents the presence of coronary artery disease, the severity of this disease and any potential stenosis cannot be assessed on this non-gated CT examination. Assessment for potential risk factor modification, dietary therapy or pharmacologic therapy may be warranted, if clinically indicated. 5. Colonic diverticulosis without definite findings to suggest an acute diverticulitis at this time. 6. Additional incidental findings, as above. Electronically Signed   By: Vinnie Langton M.D.   On: 03/08/2016 16:06   Ct Angio Chest Pe W/cm &/or Wo Cm  02/15/2016  CLINICAL DATA:  Shortness of breath. Right lower quadrant abdominal pain since this morning. EXAM: CT ANGIOGRAPHY CHEST CT ABDOMEN AND PELVIS WITH CONTRAST TECHNIQUE: Multidetector CT imaging of the chest was performed using the standard protocol during bolus administration of intravenous contrast. Multiplanar CT image reconstructions and MIPs were obtained to evaluate the vascular anatomy. Multidetector CT imaging of the abdomen and pelvis was performed using the standard protocol during bolus administration of intravenous contrast. CONTRAST:  100 mL Isovue 370 intravenous COMPARISON:  06/02/2013 FINDINGS: CTA CHEST FINDINGS Cardiovascular: There is good opacification of the pulmonary arteries. There is no pulmonary embolism. The thoracic aorta is normal in caliber and intact. Lungs: No confluent consolidation. 6 mm nodule at the  lateral aspect of the right lower lobe on axial image 79 series 6. This is new from 10/07/2015 and likely inflammatory. The peripheral opacities in the left lower lobe observed on our prior study have resolved. There also is a calcified granuloma in the right upper lobe at the level of the carina. Central airways: Patent Effusions: None Lymphadenopathy: None Esophagus: Unremarkable Musculoskeletal: No significant abnormality CT ABDOMEN and PELVIS FINDINGS Hepatobiliary: There are normal appearances of the  liver, gallbladder and bile ducts. Pancreas: Normal Spleen: Normal Adrenals/Urinary Tract: Benign appearing thickening of both adrenals, unchanged from 06/02/2013. Multiple 1.5-2.0 cm hypodensities of the kidneys, likely cysts, not significantly changed. Collecting systems, ureters and urinary bladder appear unremarkable. Stomach/Bowel: Stomach and small bowel are unremarkable. Moderate colonic diverticulosis. No evidence of acute diverticulitis or other acute inflammatory process. No bowel obstruction. Oral contrast has progressed through to the cecum. Appendix is normal. Vascular/Lymphatic: The abdominal aorta is normal in caliber. There is mild atherosclerotic calcification. There is no adenopathy in the abdomen or pelvis. Reproductive: Multiple small uterine fibroids. Ovaries are unremarkable. Other: No acute findings are evident in the abdomen or pelvis. There is no ascites. Musculoskeletal: No significant musculoskeletal lesion. Review of the MIP images confirms the above findings. IMPRESSION: 1. Negative for acute pulmonary embolism. 2. Mild nodularity in the lung bases, with resolution of nodular opacities observed in November and development of a new 6 mm right lower lobe nodule. This is most likely inflammatory given the interval changes. 3. Normal appendix. 4. No acute findings are evident in the abdomen or pelvis. 5. Diverticulosis. 6. Multiple small uterine fibroids. Electronically Signed   By: Andreas Newport M.D.   On: 02/15/2016 21:43   Ct Abdomen Pelvis W Contrast  03/08/2016  CLINICAL DATA:  70 year old female with history of chronic respiratory failure due to COPD. Sleep apnea and hyperlipidemia. Currently admitted with acute COPD exacerbation with persistent fevers and ventilatory dependent respiratory failure. EXAM: CT CHEST, ABDOMEN, AND PELVIS WITH CONTRAST TECHNIQUE: Multidetector CT imaging of the chest, abdomen and pelvis was performed following the standard protocol during bolus  administration of intravenous contrast. CONTRAST:  100 mL of Isovue-300. COMPARISON:  CT of the chest, abdomen and pelvis 02/15/2016. FINDINGS: CT CHEST FINDINGS Mediastinum/Lymph Nodes: Within tracheal to approximately 4.5 cm above the carina. Right upper extremity PICC with tip terminating at the superior cavoatrial junction. Nasogastric to extending into the stomach. Heart size is normal. There is no significant pericardial fluid, thickening or pericardial calcification. Calcifications of the inferior mitral annulus. There is atherosclerosis of the thoracic aorta, the great vessels of the mediastinum and the coronary arteries, including calcified atherosclerotic plaque in the left anterior descending coronary arteries. No pathologically enlarged mediastinal or hilar lymph nodes. Esophagus is unremarkable in appearance. No axillary lymphadenopathy. Lungs/Pleura: Compared to the prior study there is now extensive patchy peribronchovascular airspace consolidation which is somewhat nodular in appearance, compatible with severe multilobar bronchopneumonia. This is most confluent in the posterior aspect of the right lower lobe. Trace right pleural effusion. Musculoskeletal/Soft Tissues: There are no aggressive appearing lytic or blastic lesions noted in the visualized portions of the skeleton. CT ABDOMEN AND PELVIS FINDINGS Hepatobiliary: No cystic or solid hepatic lesions. No intra or extrahepatic biliary ductal dilatation. Gallbladder is normal in appearance. Pancreas: No pancreatic mass. No pancreatic ductal dilatation. No pancreatic or peripancreatic fluid or inflammatory changes. Spleen: Unremarkable. Adrenals/Urinary Tract: Right adrenal gland is normal in appearance. Diffusely thickened left adrenal gland is unchanged. Multiple sub cm low-attenuation lesions are  noted in the kidneys bilaterally, too small to definitively characterize, but favored to represent tiny cysts. There are other larger low-attenuation  lesions which do not enhance in the kidneys bilaterally, largest of which is 2.1 cm in the lower pole of the left kidney, compatible with simple cysts. No hydroureteronephrosis. Urinary bladder is nearly completely decompressed, with a Foley balloon catheter in place. Small amount of gas non dependently in the urinary bladder is iatrogenic. Stomach/Bowel: Nasogastric tube terminating in the proximal antrum of the stomach. The appearance of the stomach is normal. No pathologic dilatation of small bowel or colon. Numerous colonic diverticulae are noted, without definite surrounding inflammatory changes to suggest an acute diverticulitis at this time. Vascular/Lymphatic: Atherosclerosis throughout the abdominal and pelvic vasculature, without evidence of aneurysm or dissection. No lymphadenopathy noted in the abdomen or pelvis. Reproductive: Uterus is heterogeneous with multiple coarse calcifications, compatible with calcified fibroids. Ovaries are unremarkable in appearance. Other: Trace volume of ascites, most pronounced adjacent to the liver. Mild diffuse mesenteric edema. No pneumoperitoneum. Musculoskeletal: There are no aggressive appearing lytic or blastic lesions noted in the visualized portions of the skeleton. IMPRESSION: 1. The appearance of the lungs is compatible with severe multilobar bronchopneumonia. 2. Trace right pleural effusion. 3. Trace volume of ascites and mild diffuse mesenteric edema. 4. Atherosclerosis, including left anterior descending coronary artery disease. Please note that although the presence of coronary artery calcium documents the presence of coronary artery disease, the severity of this disease and any potential stenosis cannot be assessed on this non-gated CT examination. Assessment for potential risk factor modification, dietary therapy or pharmacologic therapy may be warranted, if clinically indicated. 5. Colonic diverticulosis without definite findings to suggest an acute  diverticulitis at this time. 6. Additional incidental findings, as above. Electronically Signed   By: Vinnie Langton M.D.   On: 03/08/2016 16:06   Ct Abdomen Pelvis W Contrast  02/15/2016  CLINICAL DATA:  Shortness of breath. Right lower quadrant abdominal pain since this morning. EXAM: CT ANGIOGRAPHY CHEST CT ABDOMEN AND PELVIS WITH CONTRAST TECHNIQUE: Multidetector CT imaging of the chest was performed using the standard protocol during bolus administration of intravenous contrast. Multiplanar CT image reconstructions and MIPs were obtained to evaluate the vascular anatomy. Multidetector CT imaging of the abdomen and pelvis was performed using the standard protocol during bolus administration of intravenous contrast. CONTRAST:  100 mL Isovue 370 intravenous COMPARISON:  06/02/2013 FINDINGS: CTA CHEST FINDINGS Cardiovascular: There is good opacification of the pulmonary arteries. There is no pulmonary embolism. The thoracic aorta is normal in caliber and intact. Lungs: No confluent consolidation. 6 mm nodule at the lateral aspect of the right lower lobe on axial image 79 series 6. This is new from 10/07/2015 and likely inflammatory. The peripheral opacities in the left lower lobe observed on our prior study have resolved. There also is a calcified granuloma in the right upper lobe at the level of the carina. Central airways: Patent Effusions: None Lymphadenopathy: None Esophagus: Unremarkable Musculoskeletal: No significant abnormality CT ABDOMEN and PELVIS FINDINGS Hepatobiliary: There are normal appearances of the liver, gallbladder and bile ducts. Pancreas: Normal Spleen: Normal Adrenals/Urinary Tract: Benign appearing thickening of both adrenals, unchanged from 06/02/2013. Multiple 1.5-2.0 cm hypodensities of the kidneys, likely cysts, not significantly changed. Collecting systems, ureters and urinary bladder appear unremarkable. Stomach/Bowel: Stomach and small bowel are unremarkable. Moderate colonic  diverticulosis. No evidence of acute diverticulitis or other acute inflammatory process. No bowel obstruction. Oral contrast has progressed through to the cecum. Appendix  is normal. Vascular/Lymphatic: The abdominal aorta is normal in caliber. There is mild atherosclerotic calcification. There is no adenopathy in the abdomen or pelvis. Reproductive: Multiple small uterine fibroids. Ovaries are unremarkable. Other: No acute findings are evident in the abdomen or pelvis. There is no ascites. Musculoskeletal: No significant musculoskeletal lesion. Review of the MIP images confirms the above findings. IMPRESSION: 1. Negative for acute pulmonary embolism. 2. Mild nodularity in the lung bases, with resolution of nodular opacities observed in November and development of a new 6 mm right lower lobe nodule. This is most likely inflammatory given the interval changes. 3. Normal appendix. 4. No acute findings are evident in the abdomen or pelvis. 5. Diverticulosis. 6. Multiple small uterine fibroids. Electronically Signed   By: Andreas Newport M.D.   On: 02/15/2016 21:43   Dg Chest Port 1 View  03/06/2016  CLINICAL DATA:  PICC line placement. EXAM: PORTABLE CHEST 1 VIEW COMPARISON:  Earlier the same date and 03/05/2016. FINDINGS: 1839 hours. Interval right arm PICC placement, tip extending to the mid SVC level. The endotracheal and nasogastric tubes appear unchanged. The heart size and mediastinal contours are stable. There is interval improved aeration of the lung bases. No pneumothorax or significant pleural effusion seen. IMPRESSION: Right arm PICC extends to the mid SVC level. Interval mildly improved basilar aeration. Electronically Signed   By: Richardean Sale M.D.   On: 03/06/2016 19:01   Dg Chest Port 1 View  03/06/2016  CLINICAL DATA:  Acute respiratory distress EXAM: PORTABLE CHEST 1 VIEW COMPARISON:  03/05/2016 FINDINGS: Endotracheal tube and NG tube again identified. Tip of NG tube not visible due to  technical factors. Heart size and vascular pattern normal. Mild opacity left base appears most consistent with atelectasis. IMPRESSION: Mild left base atelectasis Electronically Signed   By: Skipper Cliche M.D.   On: 03/06/2016 07:52   Dg Chest Port 1 View  03/05/2016  CLINICAL DATA:  Acute respiratory failure. Worsening shortness of breath. COPD. EXAM: PORTABLE CHEST 1 VIEW COMPARISON:  03/04/2016 FINDINGS: Endotracheal tube and nasogastric tube remain in appropriate position. The heart size and mediastinal contours are within normal limits. Both lungs are clear. The visualized skeletal structures are unremarkable. IMPRESSION: Endotracheal tube and nasogastric tube in appropriate position. No active disease. Electronically Signed   By: Earle Gell M.D.   On: 03/05/2016 10:08   Dg Chest Port 1 View  03/04/2016  CLINICAL DATA:  Acute respiratory failure.  Subsequent encounter. EXAM: PORTABLE CHEST 1 VIEW COMPARISON:  03/02/2016 FINDINGS: Lungs mildly hyperexpanded. No evidence of pneumonia or pulmonary edema. No convincing pleural effusion and no pneumothorax. Endotracheal tube and oral/ nasogastric tube are stable in well positioned. IMPRESSION: 1. No acute findings in the lungs. 2. Well-positioned support apparatus. 3. No change from the previous day's study. Electronically Signed   By: Lajean Manes M.D.   On: 03/04/2016 07:48   Dg Chest Port 1 View  03/02/2016  CLINICAL DATA:  Acute respiratory failure.  Subsequent encounter. EXAM: PORTABLE CHEST 1 VIEW COMPARISON:  02/29/2016 FINDINGS: Cardiac silhouette is normal in size. Normal mediastinal and hilar contours. Lungs are hyperexpanded. Small stable granuloma noted in the right mid lung. Lungs otherwise clear. No pleural effusion. No pneumothorax. Endotracheal tube is stable in well positioned. Nasogastric tube passes below the diaphragm into the proximal to mid stomach. IMPRESSION: 1. No acute cardiopulmonary disease. 2. Support apparatus is well  positioned. Electronically Signed   By: Lajean Manes M.D.   On: 03/02/2016 15:05  Dg Chest Portable 1 View  02/27/2016  CLINICAL DATA:  Respiratory distress since yesterday. Audible wheezing. Dry cough. COPD. Smoker. EXAM: PORTABLE CHEST 1 VIEW COMPARISON:  Radiographs and CT dated 02/15/2016. FINDINGS: Stable right upper lobe calcified granuloma. The 6 mm nodule seen in the right lower lobe on the CT is not visible on the current or previous radiographs. The cardiac silhouette is currently borderline enlarged with a mild increase in size. The upper lung zone pulmonary vasculature remains mildly prominent. Stable mild prominence of the interstitial markings. The lungs are hyperexpanded. Diffuse osteopenia. IMPRESSION: 1. Interval borderline cardiomegaly. 2. Stable mild pulmonary vascular congestion and changes of COPD. Electronically Signed   By: Claudie Revering M.D.   On: 02/27/2016 07:47     Assessment and Recommendation  70 y.o. female with severe chronic obstructive pulmonary disease sleep apnea anemia admitted for respiratory distress and sepsis and now oxygenating relatively well on appropriate antibiotic medication management with a wide complex tachycardia and some supraventricular tachycardia resolved at this time with no recurrence after stabilization 1. Furosemide for any pulmonary edema and lower extremity edema as necessary 2. No additional medication management for supraventricular tachycardia and/or wide complex tachycardia which is fully resolved and likely secondary to early sepsis rather than other primary cardiovascular event 3. No further cardiac diagnostics necessary at this time 4. Further consideration of transesophageal echocardiogram if patient continues to have sepsis infection or poor recovery with no etiology for possible assessment of valvular heart disease and/or endocarditis 5. call if further questions  Signed, Serafina Royals M.D. FACC

## 2016-03-09 NOTE — Care Management (Signed)
Patient had to be placed back on Fentanyl drip sedation.  Increase in frequency of free water flushes for sodium  of 150.   Continues with temp elevation.  ID involved.  Concern for septic emboli.  TEE recommended.  Family continues to pursue aggressive management of sx.  sputum positive for fungus.  Placed on Vfend

## 2016-03-10 ENCOUNTER — Inpatient Hospital Stay: Payer: Medicare Other

## 2016-03-10 LAB — HEPATIC FUNCTION PANEL
ALT: 71 U/L — AB (ref 14–54)
AST: 47 U/L — AB (ref 15–41)
Albumin: 2 g/dL — ABNORMAL LOW (ref 3.5–5.0)
Alkaline Phosphatase: 76 U/L (ref 38–126)
BILIRUBIN TOTAL: 0.5 mg/dL (ref 0.3–1.2)
Total Protein: 6.1 g/dL — ABNORMAL LOW (ref 6.5–8.1)

## 2016-03-10 LAB — GLUCOSE, CAPILLARY
GLUCOSE-CAPILLARY: 157 mg/dL — AB (ref 65–99)
GLUCOSE-CAPILLARY: 165 mg/dL — AB (ref 65–99)
GLUCOSE-CAPILLARY: 158 mg/dL — AB (ref 65–99)
GLUCOSE-CAPILLARY: 163 mg/dL — AB (ref 65–99)
GLUCOSE-CAPILLARY: 149 mg/dL — AB (ref 65–99)
GLUCOSE-CAPILLARY: 263 mg/dL — AB (ref 65–99)
GLUCOSE-CAPILLARY: 218 mg/dL — AB (ref 65–99)
GLUCOSE-CAPILLARY: 206 mg/dL — AB (ref 65–99)
GLUCOSE-CAPILLARY: 193 mg/dL — AB (ref 65–99)
Glucose-Capillary: 152 mg/dL — ABNORMAL HIGH (ref 65–99)
Glucose-Capillary: 158 mg/dL — ABNORMAL HIGH (ref 65–99)
Glucose-Capillary: 160 mg/dL — ABNORMAL HIGH (ref 65–99)
Glucose-Capillary: 170 mg/dL — ABNORMAL HIGH (ref 65–99)
Glucose-Capillary: 174 mg/dL — ABNORMAL HIGH (ref 65–99)
Glucose-Capillary: 181 mg/dL — ABNORMAL HIGH (ref 65–99)
Glucose-Capillary: 190 mg/dL — ABNORMAL HIGH (ref 65–99)
Glucose-Capillary: 191 mg/dL — ABNORMAL HIGH (ref 65–99)
Glucose-Capillary: 254 mg/dL — ABNORMAL HIGH (ref 65–99)

## 2016-03-10 LAB — CBC
HCT: 31.4 % — ABNORMAL LOW (ref 35.0–47.0)
Hemoglobin: 9.9 g/dL — ABNORMAL LOW (ref 12.0–16.0)
MCH: 29.4 pg (ref 26.0–34.0)
MCHC: 31.5 g/dL — AB (ref 32.0–36.0)
MCV: 93.3 fL (ref 80.0–100.0)
PLATELETS: 305 10*3/uL (ref 150–440)
RBC: 3.36 MIL/uL — ABNORMAL LOW (ref 3.80–5.20)
RDW: 13.6 % (ref 11.5–14.5)
WBC: 12.9 10*3/uL — ABNORMAL HIGH (ref 3.6–11.0)

## 2016-03-10 LAB — BASIC METABOLIC PANEL
Anion gap: 5 (ref 5–15)
BUN: 33 mg/dL — AB (ref 6–20)
CALCIUM: 9.6 mg/dL (ref 8.9–10.3)
CHLORIDE: 106 mmol/L (ref 101–111)
CO2: 40 mmol/L — ABNORMAL HIGH (ref 22–32)
CREATININE: 0.57 mg/dL (ref 0.44–1.00)
Glucose, Bld: 181 mg/dL — ABNORMAL HIGH (ref 65–99)
Potassium: 4.1 mmol/L (ref 3.5–5.1)
SODIUM: 151 mmol/L — AB (ref 135–145)

## 2016-03-10 LAB — DIFFERENTIAL
BAND NEUTROPHILS: 2 %
BASOS ABS: 0 10*3/uL (ref 0–0.1)
BASOS PCT: 0 %
BLASTS: 0 %
EOS PCT: 2 %
Eosinophils Absolute: 0.1 10*3/uL (ref 0–0.7)
LYMPHS ABS: 1 10*3/uL (ref 1.0–3.6)
Lymphocytes Relative: 8 %
MONOS PCT: 6 %
Metamyelocytes Relative: 0 %
Monocytes Absolute: 0.8 10*3/uL (ref 0.2–0.9)
Myelocytes: 0 %
Neutro Abs: 10.3 10*3/uL — ABNORMAL HIGH (ref 1.4–6.5)
Neutrophils Relative %: 82 %
OTHER: 0 %
PROMYELOCYTES ABS: 0 %
nRBC: 0 /100 WBC

## 2016-03-10 MED ORDER — METHYLPREDNISOLONE SODIUM SUCC 40 MG IJ SOLR
40.0000 mg | Freq: Three times a day (TID) | INTRAMUSCULAR | Status: DC
  Administered 2016-03-10 – 2016-03-11 (×4): 40 mg via INTRAVENOUS
  Filled 2016-03-10 (×4): qty 1

## 2016-03-10 MED ORDER — INSULIN GLARGINE 100 UNIT/ML ~~LOC~~ SOLN
30.0000 [IU] | SUBCUTANEOUS | Status: DC
  Administered 2016-03-10: 30 [IU] via SUBCUTANEOUS
  Filled 2016-03-10: qty 0.3

## 2016-03-10 MED ORDER — VORICONAZOLE 200 MG IV SOLR
4.0000 mg/kg | Freq: Two times a day (BID) | INTRAVENOUS | Status: DC
  Administered 2016-03-10 – 2016-03-22 (×24): 310 mg via INTRAVENOUS
  Filled 2016-03-10 (×29): qty 310

## 2016-03-10 MED ORDER — FREE WATER
200.0000 mL | Status: DC
  Administered 2016-03-10 – 2016-03-19 (×40): 200 mL
  Administered 2016-03-20 (×2): 20 mL

## 2016-03-10 MED ORDER — INSULIN ASPART 100 UNIT/ML ~~LOC~~ SOLN
5.0000 [IU] | SUBCUTANEOUS | Status: DC
  Administered 2016-03-10 (×2): 5 [IU] via SUBCUTANEOUS
  Filled 2016-03-10 (×2): qty 5

## 2016-03-10 MED ORDER — INSULIN ASPART 100 UNIT/ML ~~LOC~~ SOLN
2.0000 [IU] | SUBCUTANEOUS | Status: DC
  Administered 2016-03-10: 2 [IU] via SUBCUTANEOUS
  Administered 2016-03-10: 6 [IU] via SUBCUTANEOUS
  Filled 2016-03-10: qty 2
  Filled 2016-03-10: qty 6

## 2016-03-10 MED ORDER — SODIUM CHLORIDE 0.9 % IV SOLN
INTRAVENOUS | Status: AC
  Administered 2016-03-10: 3.9 [IU]/h via INTRAVENOUS
  Filled 2016-03-10: qty 2.5

## 2016-03-10 MED ORDER — DEXTROSE 10 % IV SOLN: INTRAVENOUS | Status: DC | PRN

## 2016-03-10 NOTE — Plan of Care (Signed)
Problem: Safety: Goal: Ability to remain free from injury will improve Outcome: Progressing No injury  Problem: Health Behavior/Discharge Planning: Goal: Ability to manage health-related needs will improve Outcome: Not Progressing Family supportive. Remains Full code.   Problem: Pain Managment: Goal: General experience of comfort will improve Outcome: Progressing On fentanyl gtt. Appears anxious/ agitated when bathed , suctioned and repositioned  Problem: Physical Regulation: Goal: Ability to maintain clinical measurements within normal limits will improve Outcome: Progressing Temp remains elevated.  Seen by ID ( Dr Ola Spurr). Bllod cx from yesterday negative so far.  Dr Juanell Fairly talked with Dr Nehemiah Massed regarding possible TEE.  Remains off pressors Goal: Will remain free from infection Outcome: Progressing On vfed, vanco and merrem. Sputum with  Mold.  Other cx negative so far. Temps 101.5 max.  WBC 12.9.  LFT's and dopplers ordered by Dr Ola Spurr  Problem: Skin Integrity: Goal: Risk for impaired skin integrity will decrease Outcome: Progressing Skin intact. Small tear to mid sacrum.  Foam to sacral area  Problem: Tissue Perfusion: Goal: Risk factors for ineffective tissue perfusion will decrease Outcome: Progressing Dopplers ordered but not completed yet.  Extremity edema .  Arms > legs. On lovenox  Problem: Activity: Goal: Risk for activity intolerance will decrease Outcome: Progressing Tolerates turning poorly with  Tachypnea, coughing, BP elevated, HR elevated, purplish face  Problem: Fluid Volume: Goal: Ability to maintain a balanced intake and output will improve Outcome: Progressing Large uop.  Water bolus increased to 237ml q 4hr. for sodium =151  Problem: Nutrition: Goal: Adequate nutrition will be maintained Outcome: Progressing Tolerating TF at goal rate.  Problem: Bowel/Gastric: Goal: Will not experience complications related to bowel motility Outcome:  Progressing Loose stool x 1 this shift.

## 2016-03-10 NOTE — Progress Notes (Signed)
Pharmacy Antimicrobial Note  Ebony [name] is a 70 y.o. female admitted on 02/27/2016 with respiratory failure.  Pharmacy has been consulted for vancomycin, meropenem, and voriconazole dosing.  This is day #13 of antimicrobial therapy. Patient is currently on meropenem (day #8), vancomycin (day #8), and voriconazole (day #5). ID has been consulted and is following.   Plan: Vancomycin trough 18 mcg/ml 4/27 not at steady state. Continue vancomycin 1250 mg iv q 12 hours. Trough scheduled 4/29 at 0730 will be at steady state.   Continue meropenem 1 g IV q8h  Continue current orders for voriconazole. Patient already received the two loading doses of 6 mg/kg (430 mg). Continue with current orders for 4 mg/kg (3102 mg) IV q12h. Dosed using adjusted body weight since patient is obese.  Per discussion with MD, simvastatin was discontinued and citalopram dose was reduced to 20 mg due to drug-drug interactions with voriconazole.  Height: 5\' 3"  (QA348G cm) Weight: 252 lb 3.3 oz (114.4 kg) IBW/kg (Calculated) : 52.4  Temp (24hrs), Avg:101 F (38.3 C), Min:100.4 F (38 C), Max:101.7 F (38.7 C)   Recent Labs Lab 03/06/16 0633 03/07/16 0509 03/08/16 0550 03/08/16 1416 03/09/16 0429 03/09/16 1949 03/10/16 0521  WBC 13.2* 15.3* 15.3*  --  14.1*  --  12.9*  CREATININE 0.56 0.70 0.69  --  0.65  --  0.57  VANCOTROUGH  --   --   --  26*  --  18  --     Estimated Creatinine Clearance: 83.2 mL/min (by C-G formula based on Cr of 0.57).    Allergies  Allergen Reactions  . Other Hives and Rash    Shellfish. Shellfish Pt reports no allergic reaction to iodine or betadine.  . Shellfish Allergy Hives and Rash   Antimicrobials this admission: levofloxacin 4/16 >> 4/20 meropenem 4/21 >>  Vancomycin 4/21 >> Voriconazole 4/24 >>  Dose adjustments this admission: 4/24 Vancomycin changed from 1500 mg IV q8h to q12h 4/26 vancomycin decreased to 1250 mg iv q 12 h due to trough of 26 mcg/ml 4/28  voriconazole increased to 310 mg q 12 h based on increased weight (4 mg/kg)  Microbiology results: 4/27 BCx: NGTD x 2 4/25 BAL: moderate growth mold 4/20 BCx: negative 4/20 UCx: Candida krusei  4/20 TA: moderate growth mold 4/17 MRSA PCR: negative  Thank you for allowing pharmacy to be a part of this patient's care.  Ulice Dash D, PharmD Clinical Pharmacist 03/10/2016 10:46 AM

## 2016-03-10 NOTE — Progress Notes (Addendum)
[place] INF[facility] PROGRESS NOTE Date of Admission:  02/27/2016     ID: Ebony [name] is a 70 y.o. female with sepsis, mold on sputum  Active Problems:   Acute respiratory failure (HCC)   History of infection not responsive to antibiotic therapy  Subjective: Remains febrile, on vent, cultures negative  ROS  Unable to obtain  Medications:  Antibiotics Given (last 72 hours)    Date/Time Action Medication Dose Rate   03/07/16 1656 Given   vancomycin (VANCOCIN) 1,500 mg in sodium chloride 0.9 % 500 mL IVPB 1,500 mg 250 mL/hr   03/07/16 2118 Given   meropenem (MERREM) 1 g in sodium chloride 0.9 % 100 mL IVPB 1 g 200 mL/hr   03/08/16 0329 Given   vancomycin (VANCOCIN) 1,500 mg in sodium chloride 0.9 % 500 mL IVPB 1,500 mg 250 mL/hr   03/08/16 0600 Given   meropenem (MERREM) 1 g in sodium chloride 0.9 % 100 mL IVPB 1 g 200 mL/hr   03/08/16 1411 Given   meropenem (MERREM) 1 g in sodium chloride 0.9 % 100 mL IVPB 1 g 200 mL/hr   03/08/16 2030 Given   vancomycin (VANCOCIN) 1,250 mg in sodium chloride 0.9 % 250 mL IVPB 1,250 mg 166.7 mL/hr   03/08/16 2207 Given   meropenem (MERREM) 1 g in sodium chloride 0.9 % 100 mL IVPB 1 g 200 mL/hr   03/09/16 0713 Given   meropenem (MERREM) 1 g in sodium chloride 0.9 % 100 mL IVPB 1 g 200 mL/hr   03/09/16 0824 Given   vancomycin (VANCOCIN) 1,250 mg in sodium chloride 0.9 % 250 mL IVPB 1,250 mg 166.7 mL/hr   03/09/16 1457 Given   meropenem (MERREM) 1 g in sodium chloride 0.9 % 100 mL IVPB 1 g 200 mL/hr   03/09/16 1949 Given   vancomycin (VANCOCIN) 1,250 mg in sodium chloride 0.9 % 250 mL IVPB 1,250 mg 166.7 mL/hr   03/09/16 2302 Given   meropenem (MERREM) 1 g in sodium chloride 0.9 % 100 mL IVPB 1 g 200 mL/hr   03/10/16 0609 Given   meropenem (MERREM) 1 g in sodium chloride 0.9 % 100 mL IVPB 1 g 200 mL/hr   03/10/16 0904 Given  [merrem infusing]   vancomycin (VANCOCIN) 1,250 mg in sodium chloride 0.9 % 250 mL IVPB 1,250 mg 166.7  mL/hr   03/10/16 1429 Given   meropenem (MERREM) 1 g in sodium chloride 0.9 % 100 mL IVPB 1 g 200 mL/hr     . acetaminophen (TYLENOL) oral liquid 160 mg/5 mL  500 mg Per Tube Q6H  . antiseptic oral rinse  7 mL Mouth Rinse QID  . aspirin  81 mg Per NG tube Daily  . budesonide (PULMICORT) nebulizer solution  0.25 mg Nebulization 4 times per day  . buPROPion  75 mg Per NG tube BID  . chlorhexidine gluconate (SAGE KIT)  15 mL Mouth Rinse BID  . citalopram  20 mg Per NG tube Daily  . enoxaparin (LOVENOX) injection  40 mg Subcutaneous Q12H  . famotidine  20 mg Oral BID  . free water  200 mL Per Tube Q4H  . furosemide  20 mg Intravenous Q12H  . insulin aspart  2-6 Units Subcutaneous Q4H  . insulin aspart  5 Units Subcutaneous Q4H  . insulin glargine  30 Units Subcutaneous Q24H  . letrozole  2.5 mg Oral Daily  . levothyroxine  200 mcg Per NG tube QAC breakfast  . levothyroxine  50 mcg  Oral QAC breakfast  . meropenem (MERREM) IV  1 g Intravenous Q8H  . methylPREDNISolone (SOLU-MEDROL) injection  40 mg Intravenous Q8H  . potassium chloride  40 mEq Oral Daily  . sennosides  5 mL Per Tube Daily  . vancomycin  1,250 mg Intravenous Q12H  . voriconazole  4 mg/kg (Adjusted) Intravenous Q12H    Objective: Vital signs in last 24 hours: Temp:  [100.4 F (38 C)-101.7 F (38.7 C)] 101.3 F (38.5 C) (04/28 1400) Pulse Rate:  [67-115] 68 (04/28 1400) Resp:  [17-24] 24 (04/28 1400) BP: (104-206)/(44-185) 148/63 mmHg (04/28 1400) SpO2:  [91 %-100 %] 94 % (04/28 1400) FiO2 (%):  [24 %-35 %] 35 % (04/28 1400) Weight:  [114.4 kg (252 lb 3.3 oz)] 114.4 kg (252 lb 3.3 oz) (04/28 5625) Physical Exam  Constitutional:  Morbidly obese, intubated, ill appearing   HENT: [place]/AT, PERRLA, no scleral icterus Mouth/Throat: ett Cardiovascular: Normal rate, distant Pulmonary/Chest: Effort normal and breath sounds normal. No respiratory distress.  has no wheezes.  Neck = supple, no nuchal rigidity Abdominal:  Soft. Bowel sounds are normal.  exhibits no distension. There is no tenderness.  Lymphadenopathy: no cervical adenopathy. No axillary adenopathy Neurological opens eye Skin: Skin is warm and dry. No rash noted. No erythema.  Psychiatric: intubated Access- R picc .    Lab Results  Recent Labs  03/09/16 0429 03/10/16 0521  WBC 14.1* 12.9*  HGB 10.1* 9.9*  HCT 32.4* 31.4*  NA 150* 151*  K 4.2 4.1  CL 106 106  CO2 38* 40*  BUN 32* 33*  CREATININE 0.65 0.57    Microbiology: Results for orders placed or performed during the hospital encounter of 02/27/16  MRSA PCR Screening     Status: None   Collection Time: 02/28/16  4:43 PM  Result Value Ref Range Status   MRSA by PCR NEGATIVE NEGATIVE Final    Comment:        The GeneXpert MRSA Assay (FDA approved for NASAL specimens only), is one component of a comprehensive MRSA colonization surveillance program. It is not intended to diagnose MRSA infection nor to guide or monitor treatment for MRSA infections.   Culture, blood (Routine X 2) w Reflex to ID Panel     Status: None   Collection Time: 03/02/16  2:05 PM  Result Value Ref Range Status   Specimen Description BLOOD RIGHT HAND  Final   Special Requests   Final    BOTTLES DRAWN AEROBIC AND ANAEROBIC ANA 2ML AER .5ML   Culture NO GROWTH 5 DAYS  Final   Report Status 03/07/2016 FINAL  Final  Culture, expectorated sputum-assessment     Status: None   Collection Time: 03/02/16  3:25 PM  Result Value Ref Range Status   Specimen Description TRACHEAL ASPIRATE  Final   Special Requests NONE  Final   Sputum evaluation THIS SPECIMEN IS ACCEPTABLE FOR SPUTUM CULTURE  Final   Report Status 03/02/2016 FINAL  Final  Culture, respiratory (NON-Expectorated)     Status: None (Preliminary result)   Collection Time: 03/02/16  3:25 PM  Result Value Ref Range Status   Specimen Description TRACHEAL ASPIRATE  Final   Special Requests NONE Reflexed from [id number]  Final   Gram Stain  MODERATE WBC SEEN FEW HYPHAE   Final   Culture   Final    MODERATE GROWTH MOLD REFERRED TO [facility] [place] IDENTIFICATION/CONFIRMATION    Report Status PENDING  Incomplete  Culture, blood (  Routine X 2) w Reflex to ID Panel     Status: None   Collection Time: 03/02/16  3:34 PM  Result Value Ref Range Status   Specimen Description BLOOD RIGHT HAND  Final   Special Requests BOTTLES DRAWN AEROBIC AND ANAEROBIC   10CC  Final   Culture NO GROWTH 5 DAYS  Final   Report Status 03/07/2016 FINAL  Final  Urine culture     Status: Abnormal   Collection Time: 03/02/16  5:19 PM  Result Value Ref Range Status   Specimen Description URINE, RANDOM  Final   Special Requests NONE  Final   Culture >=100,000 COLONIES/mL CANDIDA KRUSEI (A)  Final   Report Status 03/05/2016 FINAL  Final  Culture, fungus without smear ([facility]-Only)     Status: None (Preliminary result)   Collection Time: 03/07/16 12:14 PM  Result Value Ref Range Status   Specimen Description BRONCHIAL WASHINGS  Final   Special Requests NONE  Final   Culture MOLD IDENTIFICATION TO FOLLOW   Final   Report Status PENDING  Incomplete  Culture, expectorated sputum-assessment     Status: None   Collection Time: 03/07/16 12:14 PM  Result Value Ref Range Status   Specimen Description BRONCHIAL ALVEOLAR LAVAGE  Final   Special Requests NONE  Final   Sputum evaluation THIS SPECIMEN IS ACCEPTABLE FOR SPUTUM CULTURE  Final   Report Status 03/07/2016 FINAL  Final  Culture, respiratory (NON-Expectorated)     Status: None   Collection Time: 03/07/16 12:14 PM  Result Value Ref Range Status   Specimen Description BRONCHIAL ALVEOLAR LAVAGE  Final   Special Requests NONE Reflexed from [id number]  Final   Gram Stain MODERATE WBC SEEN NO ORGANISMS SEEN   Final   Culture   Final    MODERATE GROWTH MOLD PREVIOUS SPECIMEN ALREADY SENT TO [facility] FOR IDENTITFICATION    Report Status 03/09/2016 FINAL  Final   Culture, blood (Routine X 2) w Reflex to ID Panel     Status: None (Preliminary result)   Collection Time: 03/09/16  2:55 PM  Result Value Ref Range Status   Specimen Description BLOOD RIGHT HAND  Final   Special Requests BOTTLES DRAWN AEROBIC AND ANAEROBIC  1CC  Final   Culture NO GROWTH < 24 HOURS  Final   Report Status PENDING  Incomplete  Culture, blood (Routine X 2) w Reflex to ID Panel     Status: None (Preliminary result)   Collection Time: 03/09/16  4:10 PM  Result Value Ref Range Status   Specimen Description BLOOD RIGHT HAND  Final   Special Requests BOTTLES DRAWN AEROBIC AND ANAEROBIC  1CC  Final   Culture NO GROWTH < 24 HOURS  Final   Report Status PENDING  Incomplete    Studies/Results: Dg Chest 1 View  03/10/2016  CLINICAL DATA:  Shortness of breath. EXAM: CHEST 1 VIEW COMPARISON:  03/09/2016. FINDINGS: Endotracheal tube, NG tube, right PICC line stable position. Mild multifocal bilateral pulmonary infiltrates again noted, slight improvement from prior exam. Tiny left pleural effusions improved prior exam. No pneumothorax. IMPRESSION: 1. Lines and tubes in stable position. 2. Mild multifocal bilateral pulmonary infiltrates are again noted. Slight improvement from prior exam. Tiny left pleural effusion has improved from prior exam . Electronically Signed   By: Newton   On: 03/10/2016 07:07   Dg Chest 1 View  03/09/2016  CLINICAL DATA:  Shortness of breath. EXAM: CHEST 1 VIEW COMPARISON:  03/08/2016.  CT 03/08/2016. FINDINGS: Endotracheal  tube, NG tube, right PICC line in stable position. Heart size stable. Persistent but partially clearing bilateral multifocal pulmonary infiltrates. Tiny left pleural effusion cannot be excluded. No pneumothorax. IMPRESSION: 1. Lines and tubes in stable position. 2. Persistent but partially clearing bilateral multifocal pulmonary infiltrates. Tiny left pleural effusion cannot be excluded . Electronically Signed   By: Marcello Moores  Register    On: 03/09/2016 07:04   Ct Chest W Contrast  03/08/2016  CLINICAL DATA:  70 year old female with history of chronic respiratory failure due to COPD. Sleep apnea and hyperlipidemia. Currently admitted with acute COPD exacerbation with persistent fevers and ventilatory dependent respiratory failure. EXAM: CT CHEST, ABDOMEN, AND PELVIS WITH CONTRAST TECHNIQUE: Multidetector CT imaging of the chest, abdomen and pelvis was performed following the standard protocol during bolus administration of intravenous contrast. CONTRAST:  100 mL of Isovue-300. COMPARISON:  CT of the chest, abdomen and pelvis 02/15/2016. FINDINGS: CT CHEST FINDINGS Mediastinum/Lymph Nodes: Within tracheal to approximately 4.5 cm above the carina. Right upper extremity PICC with tip terminating at the superior cavoatrial junction. Nasogastric to extending into the stomach. Heart size is normal. There is no significant pericardial fluid, thickening or pericardial calcification. Calcifications of the inferior mitral annulus. There is atherosclerosis of the thoracic aorta, the great vessels of the mediastinum and the coronary arteries, including calcified atherosclerotic plaque in the left anterior descending coronary arteries. No pathologically enlarged mediastinal or hilar lymph nodes. Esophagus is unremarkable in appearance. No axillary lymphadenopathy. Lungs/Pleura: Compared to the prior study there is now extensive patchy peribronchovascular airspace consolidation which is somewhat nodular in appearance, compatible with severe multilobar bronchopneumonia. This is most confluent in the posterior aspect of the right lower lobe. Trace right pleural effusion. Musculoskeletal/Soft Tissues: There are no aggressive appearing lytic or blastic lesions noted in the visualized portions of the skeleton. CT ABDOMEN AND PELVIS FINDINGS Hepatobiliary: No cystic or solid hepatic lesions. No intra or extrahepatic biliary ductal dilatation. Gallbladder is normal in  appearance. Pancreas: No pancreatic mass. No pancreatic ductal dilatation. No pancreatic or peripancreatic fluid or inflammatory changes. Spleen: Unremarkable. Adrenals/Urinary Tract: Right adrenal gland is normal in appearance. Diffusely thickened left adrenal gland is unchanged. Multiple sub cm low-attenuation lesions are noted in the kidneys bilaterally, too small to definitively characterize, but favored to represent tiny cysts. There are other larger low-attenuation lesions which do not enhance in the kidneys bilaterally, largest of which is 2.1 cm in the lower pole of the left kidney, compatible with simple cysts. No hydroureteronephrosis. Urinary bladder is nearly completely decompressed, with a Foley balloon catheter in place. Small amount of gas non dependently in the urinary bladder is iatrogenic. Stomach/Bowel: Nasogastric tube terminating in the proximal antrum of the stomach. The appearance of the stomach is normal. No pathologic dilatation of small bowel or colon. Numerous colonic diverticulae are noted, without definite surrounding inflammatory changes to suggest an acute diverticulitis at this time. Vascular/Lymphatic: Atherosclerosis throughout the abdominal and pelvic vasculature, without evidence of aneurysm or dissection. No lymphadenopathy noted in the abdomen or pelvis. Reproductive: Uterus is heterogeneous with multiple coarse calcifications, compatible with calcified fibroids. Ovaries are unremarkable in appearance. Other: Trace volume of ascites, most pronounced adjacent to the liver. Mild diffuse mesenteric edema. No pneumoperitoneum. Musculoskeletal: There are no aggressive appearing lytic or blastic lesions noted in the visualized portions of the skeleton. IMPRESSION: 1. The appearance of the lungs is compatible with severe multilobar bronchopneumonia. 2. Trace right pleural effusion. 3. Trace volume of ascites and mild diffuse mesenteric edema. 4.  Atherosclerosis, including left anterior  descending coronary artery disease. Please note that although the presence of coronary artery calcium documents the presence of coronary artery disease, the severity of this disease and any potential stenosis cannot be assessed on this non-gated CT examination. Assessment for potential risk factor modification, dietary therapy or pharmacologic therapy may be warranted, if clinically indicated. 5. Colonic diverticulosis without definite findings to suggest an acute diverticulitis at this time. 6. Additional incidental findings, as above. Electronically Signed   By: Vinnie Langton M.D.   On: 03/08/2016 16:06   Ct Abdomen Pelvis W Contrast  03/08/2016  CLINICAL DATA:  70 year old female with history of chronic respiratory failure due to COPD. Sleep apnea and hyperlipidemia. Currently admitted with acute COPD exacerbation with persistent fevers and ventilatory dependent respiratory failure. EXAM: CT CHEST, ABDOMEN, AND PELVIS WITH CONTRAST TECHNIQUE: Multidetector CT imaging of the chest, abdomen and pelvis was performed following the standard protocol during bolus administration of intravenous contrast. CONTRAST:  100 mL of Isovue-300. COMPARISON:  CT of the chest, abdomen and pelvis 02/15/2016. FINDINGS: CT CHEST FINDINGS Mediastinum/Lymph Nodes: Within tracheal to approximately 4.5 cm above the carina. Right upper extremity PICC with tip terminating at the superior cavoatrial junction. Nasogastric to extending into the stomach. Heart size is normal. There is no significant pericardial fluid, thickening or pericardial calcification. Calcifications of the inferior mitral annulus. There is atherosclerosis of the thoracic aorta, the great vessels of the mediastinum and the coronary arteries, including calcified atherosclerotic plaque in the left anterior descending coronary arteries. No pathologically enlarged mediastinal or hilar lymph nodes. Esophagus is unremarkable in appearance. No axillary lymphadenopathy.  Lungs/Pleura: Compared to the prior study there is now extensive patchy peribronchovascular airspace consolidation which is somewhat nodular in appearance, compatible with severe multilobar bronchopneumonia. This is most confluent in the posterior aspect of the right lower lobe. Trace right pleural effusion. Musculoskeletal/Soft Tissues: There are no aggressive appearing lytic or blastic lesions noted in the visualized portions of the skeleton. CT ABDOMEN AND PELVIS FINDINGS Hepatobiliary: No cystic or solid hepatic lesions. No intra or extrahepatic biliary ductal dilatation. Gallbladder is normal in appearance. Pancreas: No pancreatic mass. No pancreatic ductal dilatation. No pancreatic or peripancreatic fluid or inflammatory changes. Spleen: Unremarkable. Adrenals/Urinary Tract: Right adrenal gland is normal in appearance. Diffusely thickened left adrenal gland is unchanged. Multiple sub cm low-attenuation lesions are noted in the kidneys bilaterally, too small to definitively characterize, but favored to represent tiny cysts. There are other larger low-attenuation lesions which do not enhance in the kidneys bilaterally, largest of which is 2.1 cm in the lower pole of the left kidney, compatible with simple cysts. No hydroureteronephrosis. Urinary bladder is nearly completely decompressed, with a Foley balloon catheter in place. Small amount of gas non dependently in the urinary bladder is iatrogenic. Stomach/Bowel: Nasogastric tube terminating in the proximal antrum of the stomach. The appearance of the stomach is normal. No pathologic dilatation of small bowel or colon. Numerous colonic diverticulae are noted, without definite surrounding inflammatory changes to suggest an acute diverticulitis at this time. Vascular/Lymphatic: Atherosclerosis throughout the abdominal and pelvic vasculature, without evidence of aneurysm or dissection. No lymphadenopathy noted in the abdomen or pelvis. Reproductive: Uterus is  heterogeneous with multiple coarse calcifications, compatible with calcified fibroids. Ovaries are unremarkable in appearance. Other: Trace volume of ascites, most pronounced adjacent to the liver. Mild diffuse mesenteric edema. No pneumoperitoneum. Musculoskeletal: There are no aggressive appearing lytic or blastic lesions noted in the visualized portions of the skeleton. IMPRESSION:  1. The appearance of the lungs is compatible with severe multilobar bronchopneumonia. 2. Trace right pleural effusion. 3. Trace volume of ascites and mild diffuse mesenteric edema. 4. Atherosclerosis, including left anterior descending coronary artery disease. Please note that although the presence of coronary artery calcium documents the presence of coronary artery disease, the severity of this disease and any potential stenosis cannot be assessed on this non-gated CT examination. Assessment for potential risk factor modification, dietary therapy or pharmacologic therapy may be warranted, if clinically indicated. 5. Colonic diverticulosis without definite findings to suggest an acute diverticulitis at this time. 6. Additional incidental findings, as above. Electronically Signed   By: Vinnie Langton M.D.   On: 03/08/2016 16:06    Assessment/Plan: SIYANA ERNEY is a 70 y.o. female with COPD, morbid obesity, admitted with respiratory failure, initally afebrile adn with nml wbc but now with recurrent fevers, currently intubated. Has been on meropenem and vanco with bcx neg, sputum cx with mold and fungal cx with candida krusei.  She has a R fem TLC in place since 4/18. Producing lots of thick sputum CXR does not show dense infiltrate. I do not think the mold is likely the cause of her resp compromise and fevers so would continue to pursue other sources. Removed R fem TLC as possible source of infection Given candiduria has had a change of foley cath CT chest 4/26 with severe extensive peribronchovascular airspace  consolidation. Trace R pleural effusion  CT abd 4/26 - no abscess or infectious source  4/25- still febrile. Wbc up to 15.  4/26 - remains febrile, no other change 4/28 fevers persist- CT chest with multifocal patchy infiltrate. Unclear why still febrile on meropenem, vanco and voriconazole. 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 Will follow with you.  FITZGERALD, DAVID P   03/10/2016, 3:03 PM

## 2016-03-10 NOTE — Care Management (Signed)
Intensivist is to speak with cardiology regarding TEE

## 2016-03-10 NOTE — Progress Notes (Signed)
Inpatient Diabetes Program Recommendations  AACE/ADA: New Consensus Statement on Inpatient Glycemic Control (2015)  Target Ranges:  Prepandial:   less than 140 mg/dL      Peak postprandial:   less than 180 mg/dL (1-2 hours)      Critically ill patients:  140 - 180 mg/dL   Review of Glycemic Control  Results for Ebony [name], Ebony [name] (MRN [id number]) as of 03/10/2016 07:30  Ref. Range 03/10/2016 01:59 03/10/2016 03:19 03/10/2016 04:10 03/10/2016 04:53 03/10/2016 06:11 03/10/2016 07:00  Glucose-Capillary Latest Ref Range: 65-99 mg/dL 157 (H) 165 (H) 158 (H) 158 (H) 152 (H) 160 (H)     Initiate Phase III (Transition ICU Glycemic Control Order Set) when all of the following are met:  the insulin infusion rate is < 4 units / hour  tube feeds are at goal rate and stable   there are 6 subsequent CBG readings < 180 mg/dL  Basal and tube feed coverage insulin doses are part of the order set. Please initiate Lantus 2 hours before transition off the IV insulin.  Gentry Fitz, RN, BA, MHA, CDE Diabetes Coordinator Inpatient Diabetes Program  [phone] (Team Pager) [phone] ([place]) 03/10/2016 7:33 AM

## 2016-03-10 NOTE — Progress Notes (Signed)
PULMONARY / CRITICAL CARE MEDICINE   Name: Ebony [name] MRN: [id number] DOB: Sep 04, 1946    ADMISSION DATE:  02/27/2016   DISCUSSION:  70 year old female with known history of chronic respiratory failure due to COPD, Sleep apnea, hyperlipidemia, now with admitted with AECOPD/Bronchitis, now with persistent fevers, vent dependent respiratory failure.  ASSESSMENT / PLAN  PULMONARY A: Acute on chronic respiratory failure, acute on chronic hypercapnic respiratory failure, likely due to obesity hypoventilation, as well as emphysema with hypoventilation. Review blood gases 7.4/76/73/47.1 consistent with acute on chronic respiratory acidosis. COPD exacerbation with acute bronchiolitis Right lower lobe lung nodule (benign and old) Persistent fevers of uncertain etiology. Review of chest x-ray images today shows continued hyperinflation and multifocal pneumonia consistent with emphysema, no new infiltrate is noted. Candidemia.   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  Review of CT chest images 4/27; multifocal infiltrates/nodular in appearance; embolic infection? P:   SBT trials in am as tolerated by the patient with goals of extubation Continue pulmicort/xopnex/ atrovent We'll continue steroids. Continue antibiotics, ID following.     CARDIOVASCULAR A:  Shock Hypertension, Sinus Tachycardia Hyperlipidemia P:  Diltiazem PRN Continue Simvastatin Echo results 4/25; EF 65%; no vegetations noted but study was technically difficult.    RENAL A:  Acute kidne injury - imrpoving Continued edema, continue Lasix. Hypernatremia due to diuresis.  P:  Monitor electrolytes Replace lytes per protocol  free water replacement today.   GASTROINTESTINAL A:  No active issues P:  Cont tf's Continue GI prophylaxis with H2 blocker.  HEMATOLOGIC A:  Leukocytosis, this may be due to steroids. P:  lovenox for DVT  prophylaxis SCD'S   INFECTIOUS A:Sepsis  Fever of uncertain etiology Mold in, sputum, possibly Aspergillus, patient has been started empirically on voriconazole.  -ID service following.  P:  - f\u cultures as stated above ID Consult appreciated, continue antibiotics and antifungals per ID acetaminophen scheduled-- will follow LFT's  ENDOCRINE A:  Diabetese Melitus-blood glucose appears adequately controlled. Hypothyroidism.  P:  ICU hypo/hyperglycemic protocol continue Insulin gtt; will transition to long acting.  Continue synthroid  NEUROLOGIC A:  No active issues P:  Follow CT brain RASS goal:0 -1   STUDIES:  4/4/ 17 >> CT chest was Negative for acute pulmonary embolism.Mild nodularity in the lung bases, with resolution of nodularopacities observed in November and development of a new 6 mm rightlower lobe nodule. This is most likely inflammatory given the interval changes. 03/06/16 CT Brain without contrast.>> 03/06/16 echo >> Chest x-ray 4/26 images reviewed continued hyperinflation consistent with emphysema, continued bibasilar atelectasis compared with previous images, and we'll change. --Bronchoscopy 4/25; candidemia throughout airways. Tracheobronchomalacia.  --Echo results 4/25; EF 65%; no vegetations noted but study was technically difficult.    CULTURES: Blood 4/20>> Sputum 4/20>> Urine 4/20>> Pos for Candida Krusei Bronchoscopy cultures pending.  ANTIBIOTICS: 4/16 levaquin>>4/20  4/20 Zosyn >>4/21 Meropenem 4/21>> Vanc 4/21>> Voriconazole 4/25>>   LINES/TUBES: ET tube. Right femoral line, discontinued 4/24 Right picc line 4/25>> ------------------------------------------------   SUBJECTIVE:  Patient remains orally intubated, Patient was febrile overnight.     SIGNIFICANT EVENTS: 4/17>>AECOPD, Bronchitis, requiring continuous Bipap 4/18>>intermitted bipap, bridge with HFNC 02/28/18>> intubated.  4/25>> bronchoscopy,  bronchomalacia, diffuse candidemia.    VITAL SIGNS: Temp:  [100.4 F (38 C)-101.7 F (38.7 C)] 100.9 F (38.3 C) (04/28 0800) Pulse Rate:  [67-118] 88 (04/28 0800) Resp:  [21-27] 24 (04/28 0800) BP: (104-184)/(44-76) 140/50 mmHg (04/28 0800) SpO2:  [91 %-100 %] 91 % (04/28 0800)  FiO2 (%):  [24 %-35 %] 35 % (04/28 0727) Weight:  [252 lb 3.3 oz (114.4 kg)] 252 lb 3.3 oz (114.4 kg) (04/28 [id number]) HEMODYNAMICS:   VENTILATOR SETTINGS: Vent Mode:  [-] PRVC FiO2 (%):  [24 %-35 %] 35 % Set Rate:  [24 bmp] 24 bmp Vt Set:  [450 mL] 450 mL PEEP:  [5 cmH20] 5 cmH20 INTAKE / OUTPUT:  Intake/Output Summary (Last 24 hours) at 03/10/16 [id number] Last data filed at 03/10/16 0800  Gross per 24 hour  Intake 2537.42 ml  Output   3760 ml  Net -1222.58 ml    Review of Systems  Unable to perform ROS: critical illness    Physical Exam  Constitutional: No distress.  HENT:  Head: Normocephalic and atraumatic.  Eyes: Pupils are equal, round, and reactive to light. Right eye exhibits no discharge. Left eye exhibits no discharge.  Neck: Normal range of motion. Neck supple. No JVD present. No tracheal deviation present.  Cardiovascular: Normal rate and regular rhythm.   No murmur heard. Pulmonary/Chest: She has no wheezes. She has no rales.  Abdominal: She exhibits distension. There is no tenderness. There is no guarding.  Neurological:  gcs<8T  Skin: Skin is warm. She is not diaphoretic.   on lung auscultation today 4/28 There is no significant change, there is decreased air entry bilaterally.  CBC  Recent Labs Lab 03/08/16 0550 03/09/16 0429 03/10/16 0521  WBC 15.3* 14.1* 12.9*  HGB 10.2* 10.1* 9.9*  HCT 32.5* 32.4* 31.4*  PLT 247 284 305   Coag's No results for input(s): APTT, INR in the last 168 hours. BMET  Recent Labs Lab 03/08/16 0550 03/09/16 0429 03/10/16 0521  NA 143 150* 151*  K 4.2 4.2 4.1  CL 106 106 106  CO2 32 38* 40*  BUN 42* 32* 33*  CREATININE 0.69 0.65 0.57   GLUCOSE 194* 213* 181*   Electrolytes  Recent Labs Lab 03/05/16 0531 03/06/16 0633  03/08/16 0550 03/09/16 0429 03/10/16 0521  CALCIUM 9.2 9.2  < > 9.2 10.0 9.6  MG 1.7 1.7  --   --   --   --   < > = values in this interval not displayed. Sepsis Markers  Recent Labs Lab 03/03/16 0959 03/04/16 0441 03/05/16 0500  PROCALCITON <0.10 <0.10 <0.10   ABG  Recent Labs Lab 03/08/16 0502 03/09/16 0401 03/10/16 0404  PHART 7.25* 7.32* 7.40  PCO2ART 84* 82* 76*  PO2ART 81* 78* 73*   Liver Enzymes No results for input(s): AST, ALT, ALKPHOS, BILITOT, ALBUMIN in the last 168 hours. Cardiac Enzymes No results for input(s): TROPONINI, PROBNP in the last 168 hours. Glucose  Recent Labs Lab 03/10/16 0319 03/10/16 0410 03/10/16 0453 03/10/16 0611 03/10/16 0700 03/10/16 0758  GLUCAP 165* 158* 158* 152* 160* 163*    Imaging Dg Chest 1 View  03/10/2016  CLINICAL DATA:  Shortness of breath. EXAM: CHEST 1 VIEW COMPARISON:  03/09/2016. FINDINGS: Endotracheal tube, NG tube, right PICC line stable position. Mild multifocal bilateral pulmonary infiltrates again noted, slight improvement from prior exam. Tiny left pleural effusions improved prior exam. No pneumothorax. IMPRESSION: 1. Lines and tubes in stable position. 2. Mild multifocal bilateral pulmonary infiltrates are again noted. Slight improvement from prior exam. Tiny left pleural effusion has improved from prior exam . Electronically Signed   By: [place]   On: 03/10/2016 07:07       -Marda Stalker, M.D.  03/10/2016   Critical Care Attestation.  I have personally obtained a  history, examined the patient, evaluated laboratory and imaging results, formulated the assessment and plan and placed orders. The Patient requires high complexity decision making for assessment and support, frequent evaluation and titration of therapies, application of advanced monitoring technologies and extensive interpretation of  multiple databases. The patient has critical illness that could lead imminently to failure of 1 or more organ systems and requires the highest level of physician preparedness to intervene.  Critical Care Time 35 min devoted to patient care services described in this note is 35 minutes and is exclusive of time spent in procedures.

## 2016-03-10 NOTE — Progress Notes (Signed)
Nutrition Follow-up  DOCUMENTATION CODES:   Morbid obesity  INTERVENTION:  -Discussed free water deficit  and current free water flushes during ICU rounds, MD Ramachandran gave orders to increase free water fluse from 200 mL q 6 hours to 200 mL q 4 hours; no additional orders given.  -Recommend continuing current TF regimen of Vital High Protein at rate of 60 ml/hr; continue to assess  NUTRITION DIAGNOSIS:   Inadequate oral intake related to acute illness as evidenced by NPO status.  Being addressed via TF  GOAL:   Provide needs based on ASPEN/SCCM guidelines  MONITOR:   Vent status, TF tolerance, Labs, Weight trends, I & O's  REASON FOR ASSESSMENT:   Consult, Ventilator Enteral/tube feeding initiation and management  ASSESSMENT:    70 yo female admitted with acute respiratory failure duw to acute on chronic COPD exacerbation with associated acute bronchitis, initially on Bipap, required intubation on 02/29/16  Pt remains febrile  Patient is currently intubated on ventilator support MV: 10.8 L/min Temp (24hrs), Avg:101 F (38.3 C), Min:100.4 F (38 C), Max:101.7 F (38.7 C)  Diet Order:  Diet NPO time specified   Digestive System: abdomen soft/distended, BS active  Skin:  Reviewed, no issues  Last BM:  02/28/16   Labs: sodium 151 (increased from 150 yesterday)-calculate free water deficit of 4L, Creatinine wdl Glucose Profile:  Recent Labs  03/10/16 0758 03/10/16 0847 03/10/16 1157  GLUCAP 163* 149* 263*   Meds: lasix, ss novolog. novolog q 4 hours, lantus   Height:   Ht Readings from Last 1 Encounters:  03/08/16 5\' 3"  (1.6 m)    Weight:   Wt Readings from Last 1 Encounters:  03/10/16 252 lb 3.3 oz (114.4 kg)    Ideal Body Weight:  52.3 kg  BMI:  Body mass index is 44.69 kg/(m^2).  Estimated Nutritional Needs:   Kcal:  [id number] kcals (11-14 kcals/kg current wt of 102.5 kg for BMI >30 using ASPEN guidelines)  Protein:  >/= 104 g (2.0  g/kg) IBW  Fluid:  >/= 1.5 L  EDUCATION NEEDS:   Education needs no appropriate at this time  [place], [place], [place] [phone] Pager  [phone] Weekend/On-Call Pager

## 2016-03-11 ENCOUNTER — Inpatient Hospital Stay: Payer: Medicare Other

## 2016-03-11 DIAGNOSIS — R6520 Severe sepsis without septic shock: Secondary | ICD-10-CM

## 2016-03-11 DIAGNOSIS — Z9911 Dependence on respirator [ventilator] status: Secondary | ICD-10-CM

## 2016-03-11 LAB — COMPREHENSIVE METABOLIC PANEL
ALBUMIN: 2 g/dL — AB (ref 3.5–5.0)
ALT: 73 U/L — AB (ref 14–54)
AST: 50 U/L — AB (ref 15–41)
Alkaline Phosphatase: 77 U/L (ref 38–126)
Anion gap: 6 (ref 5–15)
BUN: 40 mg/dL — AB (ref 6–20)
CHLORIDE: 106 mmol/L (ref 101–111)
CO2: 38 mmol/L — AB (ref 22–32)
CREATININE: 0.65 mg/dL (ref 0.44–1.00)
Calcium: 9.5 mg/dL (ref 8.9–10.3)
GFR calc Af Amer: >60 mL/min (ref >60)
GFR calc non Af Amer: >60 mL/min (ref >60)
GLUCOSE: 185 mg/dL — AB (ref 65–99)
POTASSIUM: 4.7 mmol/L (ref 3.5–5.1)
Sodium: 150 mmol/L — ABNORMAL HIGH (ref 135–145)
Total Bilirubin: 0.4 mg/dL (ref 0.3–1.2)
Total Protein: 6.2 g/dL — ABNORMAL LOW (ref 6.5–8.1)

## 2016-03-11 LAB — GLUCOSE, CAPILLARY
GLUCOSE-CAPILLARY: 140 mg/dL — AB (ref 65–99)
GLUCOSE-CAPILLARY: 168 mg/dL — AB (ref 65–99)
GLUCOSE-CAPILLARY: 186 mg/dL — AB (ref 65–99)
GLUCOSE-CAPILLARY: 152 mg/dL — AB (ref 65–99)
GLUCOSE-CAPILLARY: 154 mg/dL — AB (ref 65–99)
GLUCOSE-CAPILLARY: 148 mg/dL — AB (ref 65–99)
GLUCOSE-CAPILLARY: 240 mg/dL — AB (ref 65–99)
Glucose-Capillary: 119 mg/dL — ABNORMAL HIGH (ref 65–99)
Glucose-Capillary: 141 mg/dL — ABNORMAL HIGH (ref 65–99)
Glucose-Capillary: 148 mg/dL — ABNORMAL HIGH (ref 65–99)
Glucose-Capillary: 149 mg/dL — ABNORMAL HIGH (ref 65–99)
Glucose-Capillary: 151 mg/dL — ABNORMAL HIGH (ref 65–99)
Glucose-Capillary: 151 mg/dL — ABNORMAL HIGH (ref 65–99)
Glucose-Capillary: 154 mg/dL — ABNORMAL HIGH (ref 65–99)
Glucose-Capillary: 159 mg/dL — ABNORMAL HIGH (ref 65–99)
Glucose-Capillary: 160 mg/dL — ABNORMAL HIGH (ref 65–99)
Glucose-Capillary: 176 mg/dL — ABNORMAL HIGH (ref 65–99)
Glucose-Capillary: 183 mg/dL — ABNORMAL HIGH (ref 65–99)

## 2016-03-11 LAB — CBC
HCT: 30.8 % — ABNORMAL LOW (ref 35.0–47.0)
HEMOGLOBIN: 9.9 g/dL — AB (ref 12.0–16.0)
MCH: 30.3 pg (ref 26.0–34.0)
MCHC: 32.1 g/dL (ref 32.0–36.0)
MCV: 94.4 fL (ref 80.0–100.0)
PLATELETS: 298 10*3/uL (ref 150–440)
RBC: 3.26 MIL/uL — AB (ref 3.80–5.20)
RDW: 13.6 % (ref 11.5–14.5)
WBC: 9.6 10*3/uL (ref 3.6–11.0)

## 2016-03-11 LAB — BLOOD GAS, ARTERIAL
Acid-Base Excess: 13.9 mmol/L — ABNORMAL HIGH (ref 0.0–3.0)
Bicarbonate: 43.9 mEq/L — ABNORMAL HIGH (ref 21.0–28.0)
FIO2: 0.35
MECHVT: 450 mL
O2 SAT: 94.7 %
PATIENT TEMPERATURE: 37
PCO2 ART: 76 mmHg — AB (ref 32.0–48.0)
PEEP: 5 cmH2O
PO2 ART: 76 mmHg — AB (ref 83.0–108.0)
RATE: 24 resp/min
pH, Arterial: 7.37 (ref 7.350–7.450)

## 2016-03-11 LAB — C DIFFICILE QUICK SCREEN W PCR REFLEX
C Diff antigen: NEGATIVE
C Diff interpretation: NEGATIVE
C Diff toxin: NEGATIVE

## 2016-03-11 LAB — VANCOMYCIN, TROUGH: Vancomycin Tr: 17 ug/mL (ref 10–20)

## 2016-03-11 MED ORDER — HYDRALAZINE HCL 20 MG/ML IJ SOLN
10.0000 mg | INTRAMUSCULAR | Status: DC | PRN
  Administered 2016-03-11: 10 mg via INTRAVENOUS
  Administered 2016-03-12 – 2016-03-15 (×6): 20 mg via INTRAVENOUS
  Administered 2016-03-15 (×2): 40 mg via INTRAVENOUS
  Administered 2016-03-16 – 2016-03-18 (×2): 20 mg via INTRAVENOUS
  Administered 2016-03-19: 40 mg via INTRAVENOUS
  Administered 2016-03-19: 20 mg via INTRAVENOUS
  Filled 2016-03-11 (×3): qty 2
  Filled 2016-03-11: qty 1
  Filled 2016-03-11: qty 2
  Filled 2016-03-11: qty 1
  Filled 2016-03-11 (×3): qty 2
  Filled 2016-03-11 (×2): qty 1
  Filled 2016-03-11: qty 2

## 2016-03-11 MED ORDER — LEVOTHYROXINE SODIUM 150 MCG PO TABS
250.0000 ug | ORAL_TABLET | Freq: Every day | ORAL | Status: DC
  Administered 2016-03-12 – 2016-03-13 (×2): 250 ug via NASOGASTRIC
  Filled 2016-03-11 (×2): qty 1

## 2016-03-11 MED ORDER — METOPROLOL TARTRATE 5 MG/5ML IV SOLN
2.5000 mg | INTRAVENOUS | Status: DC | PRN
  Filled 2016-03-11: qty 5

## 2016-03-11 MED ORDER — INSULIN GLARGINE 100 UNIT/ML ~~LOC~~ SOLN
20.0000 [IU] | Freq: Two times a day (BID) | SUBCUTANEOUS | Status: DC
  Administered 2016-03-11: 20 [IU] via SUBCUTANEOUS
  Filled 2016-03-11 (×3): qty 0.2

## 2016-03-11 MED ORDER — FENTANYL BOLUS VIA INFUSION
25.0000 ug | INTRAVENOUS | Status: DC | PRN
  Administered 2016-03-11 – 2016-03-17 (×16): 50 ug via INTRAVENOUS
  Filled 2016-03-11: qty 50

## 2016-03-11 MED ORDER — MIDAZOLAM HCL 2 MG/2ML IJ SOLN
2.0000 mg | Freq: Once | INTRAMUSCULAR | Status: AC
  Administered 2016-03-11: 2 mg via INTRAVENOUS
  Filled 2016-03-11: qty 2

## 2016-03-11 MED ORDER — METHYLPREDNISOLONE SODIUM SUCC 40 MG IJ SOLR
40.0000 mg | Freq: Two times a day (BID) | INTRAMUSCULAR | Status: DC
  Administered 2016-03-11 – 2016-03-13 (×4): 40 mg via INTRAVENOUS
  Filled 2016-03-11 (×4): qty 1

## 2016-03-11 MED ORDER — INSULIN GLARGINE 100 UNIT/ML ~~LOC~~ SOLN
20.0000 [IU] | Freq: Two times a day (BID) | SUBCUTANEOUS | Status: DC
  Filled 2016-03-11 (×2): qty 0.2

## 2016-03-11 MED ORDER — INSULIN ASPART 100 UNIT/ML ~~LOC~~ SOLN: 4.0000 [IU] | SUBCUTANEOUS | Status: DC

## 2016-03-11 MED ORDER — INSULIN ASPART 100 UNIT/ML ~~LOC~~ SOLN
0.0000 [IU] | SUBCUTANEOUS | Status: DC
  Administered 2016-03-11: 3 [IU] via SUBCUTANEOUS
  Administered 2016-03-11: 4 [IU] via SUBCUTANEOUS
  Administered 2016-03-12: 11 [IU] via SUBCUTANEOUS
  Administered 2016-03-12 (×2): 7 [IU] via SUBCUTANEOUS
  Administered 2016-03-12 (×2): 11 [IU] via SUBCUTANEOUS
  Administered 2016-03-12 – 2016-03-13 (×2): 7 [IU] via SUBCUTANEOUS
  Administered 2016-03-13: 4 [IU] via SUBCUTANEOUS
  Administered 2016-03-13: 7 [IU] via SUBCUTANEOUS
  Administered 2016-03-13: 4 [IU] via SUBCUTANEOUS
  Administered 2016-03-13 (×2): 7 [IU] via SUBCUTANEOUS
  Administered 2016-03-14: 3 [IU] via SUBCUTANEOUS
  Administered 2016-03-14 – 2016-03-15 (×3): 4 [IU] via SUBCUTANEOUS
  Administered 2016-03-15: 3 [IU] via SUBCUTANEOUS
  Administered 2016-03-16 (×2): 4 [IU] via SUBCUTANEOUS
  Administered 2016-03-16: 3 [IU] via SUBCUTANEOUS
  Administered 2016-03-16 – 2016-03-17 (×2): 7 [IU] via SUBCUTANEOUS
  Administered 2016-03-17 (×2): 4 [IU] via SUBCUTANEOUS
  Administered 2016-03-17 (×2): 3 [IU] via SUBCUTANEOUS
  Administered 2016-03-20: 4 [IU] via SUBCUTANEOUS
  Administered 2016-03-21: 3 [IU] via SUBCUTANEOUS
  Administered 2016-03-21 (×2): 4 [IU] via SUBCUTANEOUS
  Administered 2016-03-22 (×2): 3 [IU] via SUBCUTANEOUS
  Filled 2016-03-11 (×2): qty 3
  Filled 2016-03-11: qty 4
  Filled 2016-03-11 (×2): qty 7
  Filled 2016-03-11: qty 4
  Filled 2016-03-11: qty 3
  Filled 2016-03-11: qty 11
  Filled 2016-03-11: qty 7
  Filled 2016-03-11: qty 3
  Filled 2016-03-11 (×3): qty 4
  Filled 2016-03-11: qty 7
  Filled 2016-03-11: qty 11
  Filled 2016-03-11: qty 7
  Filled 2016-03-11: qty 3
  Filled 2016-03-11: qty 4
  Filled 2016-03-11: qty 3
  Filled 2016-03-11 (×2): qty 4
  Filled 2016-03-11: qty 11
  Filled 2016-03-11: qty 4
  Filled 2016-03-11 (×2): qty 3
  Filled 2016-03-11: qty 4
  Filled 2016-03-11: qty 0.2
  Filled 2016-03-11 (×2): qty 4
  Filled 2016-03-11: qty 7
  Filled 2016-03-11: qty 4
  Filled 2016-03-11 (×3): qty 7

## 2016-03-11 MED ORDER — CHLORHEXIDINE GLUCONATE 0.12% ORAL RINSE (MEDLINE KIT)
15.0000 mL | Freq: Two times a day (BID) | OROMUCOSAL | Status: DC
  Administered 2016-03-11 – 2016-03-22 (×21): 15 mL via OROMUCOSAL
  Filled 2016-03-11 (×24): qty 15

## 2016-03-11 MED ORDER — ALTEPLASE 2 MG IJ SOLR
2.0000 mg | Freq: Once | INTRAMUSCULAR | Status: AC | PRN
  Administered 2016-03-11: 2 mg
  Filled 2016-03-11: qty 2

## 2016-03-11 MED ORDER — ANTISEPTIC ORAL RINSE SOLUTION (CORINZ)
7.0000 mL | OROMUCOSAL | Status: DC
  Administered 2016-03-11 – 2016-03-22 (×92): 7 mL via OROMUCOSAL
  Filled 2016-03-11 (×115): qty 7

## 2016-03-11 MED ORDER — INSULIN ASPART 100 UNIT/ML ~~LOC~~ SOLN
4.0000 [IU] | SUBCUTANEOUS | Status: DC
  Administered 2016-03-11 – 2016-03-12 (×3): 4 [IU] via SUBCUTANEOUS
  Filled 2016-03-11 (×3): qty 4

## 2016-03-11 MED ORDER — LABETALOL HCL 5 MG/ML IV SOLN
10.0000 mg | INTRAVENOUS | Status: DC | PRN
  Administered 2016-03-11: 10 mg via INTRAVENOUS
  Filled 2016-03-11: qty 4

## 2016-03-11 MED ORDER — STERILE WATER FOR INJECTION IJ SOLN
INTRAMUSCULAR | Status: AC
  Administered 2016-03-11: 23:00:00
  Filled 2016-03-11: qty 10

## 2016-03-11 NOTE — Progress Notes (Signed)
Pharmacy Antimicrobial Note  Ebony [name] is a 70 y.o. female admitted on 02/27/2016 with respiratory failure.  Pharmacy has been consulted for vancomycin, meropenem, and voriconazole dosing.  This is day #14 of antimicrobial therapy. Patient is currently on meropenem (day #9), vancomycin (day #9), and voriconazole (day #6). ID has been consulted and is following.   Plan: Vancomycin trough 17 mcg/ml on 4/29 @ 07:32. Will continue current regimen of Vancomycin 1250 mg IV q12 hours. Will follow renal function.   Continue meropenem 1 g IV q8h  Continue current orders for voriconazole. Patient already received the two loading doses of 6 mg/kg (430 mg). Continue with current orders for 4 mg/kg (3102 mg) IV q12h. Dosed using adjusted body weight since patient is obese.   Height: 5\' 3"  (160 cm) Weight: 257 lb 0.9 oz (116.6 kg) IBW/kg (Calculated) : 52.4  Temp (24hrs), Avg:100.9 F (38.3 C), Min:99.1 F (37.3 C), Max:101.5 F (38.6 C)   Recent Labs Lab 03/07/16 0509 03/08/16 0550  03/09/16 0429 03/09/16 1949 03/10/16 0521 03/11/16 0500 03/11/16 0732  WBC 15.3* 15.3*  --  14.1*  --  12.9* 9.6  --   CREATININE 0.70 0.69  --  0.65  --  0.57 0.65  --   VANCOTROUGH  --   --   < >  --  18  --   --  17  < > = values in this interval not displayed.  Estimated Creatinine Clearance: 84.1 mL/min (by C-G formula based on Cr of 0.65).    Allergies  Allergen Reactions  . Other Hives and Rash    Shellfish. Shellfish Pt reports no allergic reaction to iodine or betadine.  . Shellfish Allergy Hives and Rash   Antimicrobials this admission: levofloxacin 4/16 >> 4/20 meropenem 4/21 >>  Vancomycin 4/21 >> Voriconazole 4/24 >>  Dose adjustments this admission: 4/24 Vancomycin changed from 1500 mg IV q8h to q12h 4/26 vancomycin decreased to 1250 mg iv q 12 h due to trough of 26 mcg/ml 4/28 voriconazole increased to 310 mg q 12 h based on increased weight (4 mg/kg)  Microbiology  results: 4/27 BCx: NGTD x 2 4/25 BAL: moderate growth mold 4/20 BCx: negative 4/20 UCx: Candida krusei  4/20 TA: moderate growth mold 4/17 MRSA PCR: negative  Thank you for allowing pharmacy to be a part of this patient's care.  Larene Beach, PharmD Clinical Pharmacist 03/11/2016 10:35 AM

## 2016-03-11 NOTE — Progress Notes (Signed)
Name: Ebony [name] MRN: [id number] DOB: 1946-10-09    ADMISSION DATE:  02/27/2016    CHIEF COMPLAINT: Acute respiratory distress and acute COPD exacerbation  HISTORY OF PRESENT ILLNESS:   Ebony [name] is a 70 year old female with medical history of HTN, DM,chronic respiratory failure due to COPD,sleep apnea, Hyperlipidemia, hypothyroidism, depression, morbid obesity who presented to [facility] on 4/16 with increased shortness of breath . She was placed on BiPAP and transitioned to HFNC, but the patient continues to complaint of increased shortness of breath, tightness, patient had a hard time to speak in full sentences. Patient was transferred to ICU for further and PCCM team was consulted for further management. Patient denies any chest pain, nausea, vomiting, dizziness , diaphoresis.  SUBJECTIVE: Low grade fever overnight. No other issues  VITAL SIGNS: Temp:  [100.8 F (38.2 C)-101.5 F (38.6 C)] 101.1 F (38.4 C) (04/29 0400) Pulse Rate:  [55-106] 65 (04/29 0600) Resp:  [17-25] 24 (04/29 0000) BP: (112-206)/(50-185) 138/79 mmHg (04/29 0600) SpO2:  [91 %-96 %] 95 % (04/29 0600) FiO2 (%):  [35 %] 35 % (04/29 0230) Weight:  [252 lb 3.3 oz (114.4 kg)-257 lb 0.9 oz (116.6 kg)] 257 lb 0.9 oz (116.6 kg) (04/29 0452)  PHYSICAL EXAMINATION: Constitutional: No distress.  HENT:  Head: Normocephalic and atraumatic.  Eyes: Pupils are equal, round, and reactive to light. Right eye exhibits no discharge. Left eye exhibits no discharge.  Neck: Normal range of motion. Neck supple. No JVD present. No tracheal deviation present.  Cardiovascular: Normal rate and regular rhythm.  No murmur heard. Pulmonary/Chest: Bilateral airflow, decreased breath sounds bilaterally, mild expiratory wheezes; no rales.  Abdominal: Abdomen mildly distended. There is no tenderness. There is no guarding.  Neurological:  Gcs<8T; opens eyes to touch and withdraws from noxious stimulus  Skin: Skin is warm. She is not  diaphoretic.   Recent Labs Lab 03/09/16 0429 03/10/16 0521 03/11/16 0500  NA 150* 151* 150*  K 4.2 4.1 4.7  CL 106 106 106  CO2 38* 40* 38*  BUN 32* 33* 40*  CREATININE 0.65 0.57 0.65  GLUCOSE 213* 181* 185*    Recent Labs Lab 03/09/16 0429 03/10/16 0521 03/11/16 0500  HGB 10.1* 9.9* 9.9*  HCT 32.4* 31.4* 30.8*  WBC 14.1* 12.9* 9.6  PLT 284 305 298   Dg Chest 1 View  03/10/2016  CLINICAL DATA:  Shortness of breath. EXAM: CHEST 1 VIEW COMPARISON:  03/09/2016. FINDINGS: Endotracheal tube, NG tube, right PICC line stable position. Mild multifocal bilateral pulmonary infiltrates again noted, slight improvement from prior exam. Tiny left pleural effusions improved prior exam. No pneumothorax. IMPRESSION: 1. Lines and tubes in stable position. 2. Mild multifocal bilateral pulmonary infiltrates are again noted. Slight improvement from prior exam. Tiny left pleural effusion has improved from prior exam . Electronically Signed   By: [place]   On: 03/10/2016 07:07   STUDIES:  4/4/ 17 >> CT chest was Negative for acute pulmonary embolism.Mild nodularity in the lung bases, with resolution of nodularopacities observed in November and development of a new 6 mm rightlower lobe nodule. This is most likely inflammatory given the interval changes. 03/06/16 CT Brain without contrast.>>negative Chest x-ray 4/26 images reviewed continued hyperinflation consistent with emphysema, continued bibasilar atelectasis compared with previous images, and we'll change. --Bronchoscopy 4/25; candidemia throughout airways. Tracheobronchomalacia.  --Echo results 4/25; EF 65%; no vegetations noted but study was technically difficult.   CULTURES: 04/25 Blood>> Sputum>mold Urine  ANTIBIOTICS: Vancomycin 4/21> Meropenem 4/21> Voriconazole  4/24>  SIGNIFICANT EVENTS: 4/17>>AECOPD, Bronchitis, requiring continuous Bipap 4/18>>intermitted bipap, bridge with HFNC 02/28/18>> intubated.  4/25>>  bronchoscopy, bronchomalacia, diffuse candidemia.   LINES/TUBES: ETT 4/18 PICC Triple lumen 04/24>  DISCUSSION:  70 year old female with known history of chronic respiratory failure due to COPD, Sleep apnea, hyperlipidemia, now with admitted with AECOPD/Bronchitis, now with persistent fevers, vent dependent respiratory failure.  ASSESSMENT / PLAN  PULMONARY A: Acute on chronic hypercapnic respiratory failure COPD with severe persistent obstruction OHS Multiple fluffy B nodular infiltrates c/w bronchopneumonia Gas trapping - need to reduce vent rate P:  Cont full vent support - settings reviewed and/or adjusted Cont vent bundle Daily SBT if/when meets criteria   CARDIOVASCULAR A:  Shock, resolved Hypertension Sinus Tachycardia Hyperlipidemia P:  PRN hydralazine PRN metoprolol  RENAL A:  AKI - resolved Hypervolemia Hypernatremia  P:  Monitor BMET intermittently Monitor I/Os Correct electrolytes as indicated  GASTROINTESTINAL A:  No active issues P:  SUP: enteral famotidine Cont TFs  HEMATOLOGIC A:   P:  lovenox for DVT prophylaxis SCD'S   INFECTIOUS A: Persistent fever Candiduria 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 We discussed the potential benefits and adverse consequences of trach tube placement. I made it clear that she will not be successfully weaned from vent support without a trach and that it is not guaranteed that she can wean successfully with it. They are considering the factors discussed and will decide by tomorrow if they wish to proceed  with such   Merton Border, MD PCCM service Mobile [phone] Pager [phone] 03/11/2016

## 2016-03-11 NOTE — Progress Notes (Signed)
Increased work of breathing,increased BP, reddened face and frequent coughing after bath and linen change. Versed and fentanyl bolus required.

## 2016-03-11 NOTE — Progress Notes (Signed)
Pt. dsynchronous w/ vent.  Titrated to max on fent gtt, discussed w/ Patria Mane, NP bedside.  Discussed PRN's, pt. Hx and recent diagnostic tests. Patria Mane, NP ordered one-time 2mg  versed. Will continue to monitor t. Closely.

## 2016-03-11 NOTE — Plan of Care (Signed)
Problem: Education: Goal: Knowledge of [facility] Education information/materials will improve Outcome: Progressing Family had discussion with Dr Alva Garnet regarding plan of care/treatment goals/ tracheostomy/ possible scenarios with pts husband, son, and daughter in-law.  The pt raised her step-son who will be flying in on Tuesday .  They would like to wait for his input if possible.  The son present seems to be leaning toward extubation and comfort care. Pt husband is not ready to verbalize a decision.  They seem to agree on having ENT evaluate for possible trach and once step son arrives make decision to trach vs extubate by Wednesday a.m.    Problem: Safety: Goal: Ability to remain free from injury will improve Outcome: Progressing No injury  Problem: Pain Managment: Goal: General experience of comfort will improve Outcome: Progressing Fentanyl gtts and prn bolus fentanyl seem to keep pt comfortable  Problem: Physical Regulation: Goal: Ability to maintain clinical measurements within normal limits will improve Outcome: Progressing Temps improving. WBC improved 9.6.  Temps improving. Sputum white. Lungs diminished with wheezes Goal: Will remain free from infection Outcome: Progressing Stool for c diff negative  Problem: Skin Integrity: Goal: Risk for impaired skin integrity will decrease Outcome: Progressing Edema improving.  Small tear to coccyx   Problem: Tissue Perfusion: Goal: Risk factors for ineffective tissue perfusion will decrease Outcome: Progressing No evidence of DVT LE's on doppler done yesterday.  Pt on lovenox  Problem: Activity: Goal: Risk for activity intolerance will decrease Outcome: Not Progressing Poorly tolerates turning for linen change and pericare.  Face turns purplish/ red, sats decrease requiring 100% Fio2, BP elevates, asynchrony with vent.  She has severe weakness of all extremities.  Problem: Fluid Volume: Goal: Ability to maintain a  balanced intake and output will improve Outcome: Progressing uop good.  Na+=150  Problem: Nutrition: Goal: Adequate nutrition will be maintained Outcome: Progressing TF at goal.  Problem: Bowel/Gastric: Goal: Will not experience complications related to bowel motility Outcome: Progressing Laxatives have been held.  Several loose stools past 72hr. Negative c-diff  Problem: ICU Phase Progression Outcomes Goal: Dyspnea controlled at rest Outcome: Progressing Dyspnea, increased work of breathing and asynchrony with [employer]

## 2016-03-12 ENCOUNTER — Inpatient Hospital Stay: Payer: Medicare Other

## 2016-03-12 DIAGNOSIS — E118 Type 2 diabetes mellitus with unspecified complications: Secondary | ICD-10-CM

## 2016-03-12 DIAGNOSIS — J449 Chronic obstructive pulmonary disease, unspecified: Secondary | ICD-10-CM

## 2016-03-12 DIAGNOSIS — Z794 Long term (current) use of insulin: Secondary | ICD-10-CM

## 2016-03-12 LAB — GLUCOSE, CAPILLARY
GLUCOSE-CAPILLARY: 216 mg/dL — AB (ref 65–99)
Glucose-Capillary: 243 mg/dL — ABNORMAL HIGH (ref 65–99)
Glucose-Capillary: 250 mg/dL — ABNORMAL HIGH (ref 65–99)
Glucose-Capillary: 253 mg/dL — ABNORMAL HIGH (ref 65–99)
Glucose-Capillary: 279 mg/dL — ABNORMAL HIGH (ref 65–99)
Glucose-Capillary: 252 mg/dL — ABNORMAL HIGH (ref 65–99)
Glucose-Capillary: 250 mg/dL — ABNORMAL HIGH (ref 65–99)

## 2016-03-12 LAB — COMPREHENSIVE METABOLIC PANEL
ALBUMIN: 2.2 g/dL — AB (ref 3.5–5.0)
ALK PHOS: 74 U/L (ref 38–126)
ALT: 83 U/L — AB (ref 14–54)
AST: 47 U/L — ABNORMAL HIGH (ref 15–41)
Anion gap: 4 — ABNORMAL LOW (ref 5–15)
BUN: 43 mg/dL — AB (ref 6–20)
CALCIUM: 9.5 mg/dL (ref 8.9–10.3)
CO2: 40 mmol/L — AB (ref 22–32)
CREATININE: 0.66 mg/dL (ref 0.44–1.00)
Chloride: 102 mmol/L (ref 101–111)
GFR calc Af Amer: >60 mL/min (ref >60)
GFR calc non Af Amer: >60 mL/min (ref >60)
GLUCOSE: 261 mg/dL — AB (ref 65–99)
Potassium: 5.4 mmol/L — ABNORMAL HIGH (ref 3.5–5.1)
SODIUM: 146 mmol/L — AB (ref 135–145)
Total Bilirubin: 0.5 mg/dL (ref 0.3–1.2)
Total Protein: 6.2 g/dL — ABNORMAL LOW (ref 6.5–8.1)

## 2016-03-12 LAB — CBC
HEMATOCRIT: 30.4 % — AB (ref 35.0–47.0)
HEMOGLOBIN: 9.4 g/dL — AB (ref 12.0–16.0)
MCH: 29.5 pg (ref 26.0–34.0)
MCHC: 31.1 g/dL — AB (ref 32.0–36.0)
MCV: 94.9 fL (ref 80.0–100.0)
PLATELETS: 287 10*3/uL (ref 150–440)
RBC: 3.2 MIL/uL — AB (ref 3.80–5.20)
RDW: 13.9 % (ref 11.5–14.5)
WBC: 10.6 10*3/uL (ref 3.6–11.0)

## 2016-03-12 LAB — BASIC METABOLIC PANEL
ANION GAP: 4 — AB (ref 5–15)
BUN: 41 mg/dL — ABNORMAL HIGH (ref 6–20)
CALCIUM: 9.6 mg/dL (ref 8.9–10.3)
CHLORIDE: 99 mmol/L — AB (ref 101–111)
CO2: 43 mmol/L — ABNORMAL HIGH (ref 22–32)
CREATININE: 0.62 mg/dL (ref 0.44–1.00)
GFR calc non Af Amer: >60 mL/min (ref >60)
Glucose, Bld: 245 mg/dL — ABNORMAL HIGH (ref 65–99)
POTASSIUM: 4.6 mmol/L (ref 3.5–5.1)
SODIUM: 146 mmol/L — AB (ref 135–145)

## 2016-03-12 LAB — MAGNESIUM: MAGNESIUM: 2 mg/dL (ref 1.7–2.4)

## 2016-03-12 MED ORDER — FUROSEMIDE 10 MG/ML IJ SOLN
40.0000 mg | Freq: Two times a day (BID) | INTRAMUSCULAR | Status: AC
  Administered 2016-03-12 (×2): 40 mg via INTRAVENOUS
  Filled 2016-03-12 (×2): qty 4

## 2016-03-12 MED ORDER — ENOXAPARIN SODIUM 40 MG/0.4ML ~~LOC~~ SOLN
40.0000 mg | SUBCUTANEOUS | Status: DC
  Filled 2016-03-12: qty 0.4

## 2016-03-12 MED ORDER — INSULIN GLARGINE 100 UNIT/ML ~~LOC~~ SOLN
30.0000 [IU] | Freq: Two times a day (BID) | SUBCUTANEOUS | Status: DC
  Administered 2016-03-12 – 2016-03-19 (×11): 30 [IU] via SUBCUTANEOUS
  Filled 2016-03-12 (×18): qty 0.3

## 2016-03-12 MED ORDER — INSULIN ASPART 100 UNIT/ML ~~LOC~~ SOLN
7.0000 [IU] | SUBCUTANEOUS | Status: DC
  Administered 2016-03-12 – 2016-03-17 (×21): 7 [IU] via SUBCUTANEOUS
  Filled 2016-03-12 (×19): qty 7

## 2016-03-12 NOTE — Progress Notes (Signed)
Pharmacy Antimicrobial Note  Ebony [name] is a 70 y.o. female admitted on 02/27/2016 with respiratory failure.  Pharmacy has been consulted for vancomycin, meropenem, and voriconazole dosing.  This is day #15 of antimicrobial therapy. Patient is currently on meropenem (day #10), vancomycin (day #10 ), and voriconazole (day #7). ID has been consulted and is following.   Plan:  Will continue current regimen of Vancomycin 1250 mg IV q12 hours. Will follow renal function.   Continue meropenem 1 g IV q8h  Continue current orders for voriconazole. Patient already received the two loading doses of 6 mg/kg (430 mg). Continue with current orders for 4 mg/kg (3102 mg) IV q12h. Dosed using adjusted body weight since patient is obese.   Height: 5\' 3"  (160 cm) Weight: 257 lb 15 oz (117 kg) IBW/kg (Calculated) : 52.4  Temp (24hrs), Avg:99.1 F (37.3 C), Min:97.9 F (36.6 C), Max:99.6 F (37.6 C)   Recent Labs Lab 03/08/16 0550  03/09/16 0429 03/09/16 1949 03/10/16 0521 03/11/16 0500 03/11/16 0732 03/12/16 0453 03/12/16 0622  WBC 15.3*  --  14.1*  --  12.9* 9.6  --  10.6  --   CREATININE 0.69  --  0.65  --  0.57 0.65  --   --  0.66  VANCOTROUGH  --   < >  --  18  --   --  17  --   --   < > = values in this interval not displayed.  Estimated Creatinine Clearance: 84.2 mL/min (by C-G formula based on Cr of 0.66).    Allergies  Allergen Reactions  . Other Hives and Rash    Shellfish. Shellfish Pt reports no allergic reaction to iodine or betadine.  . Shellfish Allergy Hives and Rash   Antimicrobials this admission: levofloxacin 4/16 >> 4/20 meropenem 4/21 >>  Vancomycin 4/21 >> Voriconazole 4/24 >>  Dose adjustments this admission: 4/24 Vancomycin changed from 1500 mg IV q8h to q12h 4/26 vancomycin decreased to 1250 mg iv q 12 h due to trough of 26 mcg/ml 4/28 voriconazole increased to 310 mg q 12 h based on increased weight (4 mg/kg)  Microbiology results: 4/27 BCx: NGTD  x 2 4/25 BAL: moderate growth mold 4/20 BCx: negative 4/20 UCx: Candida krusei  4/20 TA: moderate growth mold 4/17 MRSA PCR: negative  Thank you for allowing pharmacy to be a part of this patient's care.  Larene Beach, PharmD Clinical Pharmacist 03/12/2016 9:51 AM

## 2016-03-12 NOTE — Progress Notes (Signed)
Name: Ebony [name] MRN: [id number] DOB: Jan 11, 1946    ADMISSION DATE:  02/27/2016    CHIEF COMPLAINT: Acute respiratory distress and acute COPD exacerbation  HISTORY OF PRESENT ILLNESS:   Ebony [name] is a 70 year old female with medical history of HTN, DM,chronic respiratory failure due to COPD,sleep apnea, Hyperlipidemia, hypothyroidism, depression, morbid obesity who presented to [facility] on 4/16 with increased shortness of breath . She was placed on BiPAP and transitioned to HFNC, but the patient continues to complaint of increased shortness of breath, tightness, patient had a hard time to speak in full sentences. Patient was transferred to ICU for further and PCCM team was consulted for further management. Patient denies any chest pain, nausea, vomiting, dizziness , diaphoresis.  SUBJECTIVE: Vent dyssynchrony early last night; no increase in O2 needs. Moderate amount of oral secretions. Hyperglycemia with blood glucose levels in the 200s. Insulin dose adjusted. Diarrhea improved. C-Diff negative  VITAL SIGNS: Temp:  [97.9 F (36.6 C)-101.3 F (38.5 C)] 99.1 F (37.3 C) (04/30 0000) Pulse Rate:  [59-88] 68 (04/30 0200) Resp:  [13-28] 14 (04/30 0200) BP: (112-190)/(47-80) 134/55 mmHg (04/30 0200) SpO2:  [87 %-97 %] 95 % (04/30 0200) FiO2 (%):  [35 %-40 %] 40 % (04/30 0044) Weight:  [257 lb 0.9 oz (116.6 kg)] 257 lb 0.9 oz (116.6 kg) (04/29 0452)  PHYSICAL EXAMINATION: Constitutional: RASS -2, + F/C on WUA  HENT: NCAT  Lungs: prolonged expiration with faint distant wheezes Cardiovascular: RRR  Abdominal: Abdomen mildly distended. There is no tenderness. There is no guarding.  Neurological:  No focal deficits Ext: symmetric BUE and BLE edema    Recent Labs Lab 03/09/16 0429 03/10/16 0521 03/11/16 0500  NA 150* 151* 150*  K 4.2 4.1 4.7  CL 106 106 106  CO2 38* 40* 38*  BUN 32* 33* 40*  CREATININE 0.65 0.57 0.65  GLUCOSE 213* 181* 185*    Recent Labs Lab  03/09/16 0429 03/10/16 0521 03/11/16 0500  HGB 10.1* 9.9* 9.9*  HCT 32.4* 31.4* 30.8*  WBC 14.1* 12.9* 9.6  PLT 284 305 298   CXR: interstitial prominence   STUDIES:  4/4/ 17 >> CT chest was Negative for acute pulmonary embolism.Mild nodularity in the lung bases, with resolution of nodularopacities observed in November and development of a new 6 mm rightlower lobe nodule. This is most likely inflammatory given the interval changes. 03/06/16 CT Brain without contrast.>>negative Chest x-ray 4/26 images reviewed continued hyperinflation consistent with emphysema, continued bibasilar atelectasis compared with previous images, and we'll change. --Bronchoscopy 4/25; candidemia throughout airways. Tracheobronchomalacia.  --Echo results 4/25; EF 65%; no vegetations noted but study was technically difficult.   CULTURES: 04/25 Blood>> Sputum>mold Urine> C-diff: negative  ANTIBIOTICS: Vancomycin 4/21> Meropenem 4/21> Voriconazole 4/24>  SIGNIFICANT EVENTS: 4/17>>AECOPD, Bronchitis, requiring continuous Bipap 4/18>>intermitted bipap, bridge with HFNC 02/28/18>> intubated.  4/25>> bronchoscopy, bronchomalacia, diffuse candidemia.   LINES/TUBES: ETT 4/18 PICC Triple lumen 04/24>   ASSESSMENT / PLAN  PULMONARY A: Acute on chronic hypercapnic respiratory failure COPD with severe persistent obstruction Prolonged VDRF OHS Multiple fluffy B nodular infiltrates c/w bronchopneumonia P:  Cont full vent support - settings reviewed and/or adjusted Cont vent bundle Daily SBT if/when meets criteria CXR daily and prn Nebulized steroids and bronchodilator Cont discussions with husband, son and daughter re: trach vs no trach   CARDIOVASCULAR A:  Shock, resolved Hypertension Sinus Tachycardia Hyperlipidemia P:  PRN hydralazine PRN metoprolol Monitor hemodynamics MAP goal > 65 mmHg  RENAL A:  AKI -  resolved Hypervolemia Hypernatremia  Hypervolemia Mild  hyperkalemia P:  Monitor BMET intermittently Monitor I/Os Correct electrolytes as indicated Lasix X 2 04/30 DC scheduled KCl  GASTROINTESTINAL A:  Elevated LFTs  Diarrhea-c-diff negative P:  SUP: enteral famotidine Cont TFs Monitor LFTs intermittently D/C all stool softeners  HEMATOLOGIC A:  ICU acquired anemia - no active bleeding P:  DVT px: LMWH Monitor CBC intermittently Transfuse per usual guidelines  INFECTIOUS A: Persistent fever-improved Candiduria 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 Will update and continue discussion of goals of care (trach vs no trach) when they arrive   CCM time: 40 mins The above time includes time spent in consultation with patient and/or family members and reviewing care plan on multidisciplinary rounds  Merton Border, MD PCCM service Mobile [phone] Pager [phone]  03/12/2016

## 2016-03-12 NOTE — Progress Notes (Signed)
Pt. CBG greater than 200 x 2, discussed with Patria Mane, NP.  Pt. Previously ordered standing 4 unit dose of novalog plus resistant sliding scale, 0000 dose = 11 units and CBG increased from 240 to 250. Tukov changed standing novalog dose from 4 units Q4 to 7 units Q4 plus sliding scale. Will continue to monitor pt. Closely.

## 2016-03-13 ENCOUNTER — Inpatient Hospital Stay: Payer: Medicare Other

## 2016-03-13 DIAGNOSIS — J441 Chronic obstructive pulmonary disease with (acute) exacerbation: Secondary | ICD-10-CM | POA: Insufficient documentation

## 2016-03-13 DIAGNOSIS — E441 Mild protein-calorie malnutrition: Secondary | ICD-10-CM | POA: Insufficient documentation

## 2016-03-13 LAB — TSH: TSH: 0.084 u[IU]/mL — AB (ref 0.350–4.500)

## 2016-03-13 LAB — BASIC METABOLIC PANEL
Anion gap: 5 (ref 5–15)
BUN: 45 mg/dL — AB (ref 6–20)
CALCIUM: 9.8 mg/dL (ref 8.9–10.3)
CO2: 41 mmol/L — ABNORMAL HIGH (ref 22–32)
CREATININE: 0.54 mg/dL (ref 0.44–1.00)
Chloride: 99 mmol/L — ABNORMAL LOW (ref 101–111)
Glucose, Bld: 257 mg/dL — ABNORMAL HIGH (ref 65–99)
Potassium: 4.5 mmol/L (ref 3.5–5.1)
SODIUM: 145 mmol/L (ref 135–145)

## 2016-03-13 LAB — URINE CULTURE: CULTURE: NO GROWTH

## 2016-03-13 LAB — CBC
HEMATOCRIT: 32.7 % — AB (ref 35.0–47.0)
Hemoglobin: 10.3 g/dL — ABNORMAL LOW (ref 12.0–16.0)
MCH: 29.8 pg (ref 26.0–34.0)
MCHC: 31.5 g/dL — ABNORMAL LOW (ref 32.0–36.0)
MCV: 94.8 fL (ref 80.0–100.0)
Platelets: 306 10*3/uL (ref 150–440)
RBC: 3.45 MIL/uL — AB (ref 3.80–5.20)
RDW: 13.7 % (ref 11.5–14.5)
WBC: 9.4 10*3/uL (ref 3.6–11.0)

## 2016-03-13 LAB — GLUCOSE, CAPILLARY
GLUCOSE-CAPILLARY: 186 mg/dL — AB (ref 65–99)
GLUCOSE-CAPILLARY: 204 mg/dL — AB (ref 65–99)
Glucose-Capillary: 226 mg/dL — ABNORMAL HIGH (ref 65–99)
Glucose-Capillary: 201 mg/dL — ABNORMAL HIGH (ref 65–99)
Glucose-Capillary: 172 mg/dL — ABNORMAL HIGH (ref 65–99)

## 2016-03-13 MED ORDER — LOSARTAN POTASSIUM 50 MG PO TABS
100.0000 mg | ORAL_TABLET | Freq: Every day | ORAL | Status: DC
  Administered 2016-03-13: 100 mg via ORAL
  Filled 2016-03-13: qty 2

## 2016-03-13 MED ORDER — METOPROLOL TARTRATE 5 MG/5ML IV SOLN
5.0000 mg | Freq: Once | INTRAVENOUS | Status: AC
  Administered 2016-03-13: 5 mg via INTRAVENOUS

## 2016-03-13 MED ORDER — LEVOTHYROXINE SODIUM 75 MCG PO TABS: 225.0000 ug | ORAL_TABLET | Freq: Every day | ORAL | Status: DC

## 2016-03-13 MED ORDER — METHYLPREDNISOLONE SODIUM SUCC 40 MG IJ SOLR
20.0000 mg | Freq: Every day | INTRAMUSCULAR | Status: DC
  Administered 2016-03-14: 20 mg via INTRAVENOUS
  Filled 2016-03-13: qty 1

## 2016-03-13 MED ORDER — ALUM & MAG HYDROXIDE-SIMETH 200-200-20 MG/5ML PO SUSP: 30.0000 mL | ORAL | Status: DC | PRN

## 2016-03-13 MED ORDER — HYDROCHLOROTHIAZIDE 25 MG PO TABS
25.0000 mg | ORAL_TABLET | Freq: Every day | ORAL | Status: DC
  Administered 2016-03-13: 25 mg via ORAL
  Filled 2016-03-13: qty 1

## 2016-03-13 NOTE — Care Management (Signed)
Consults for trach and peg tube.  At present, will anticipate need for [facility].  Patient's insurance requires 21 day stay before autho can be initiated.  Attending will speak with family. CM will inquire about facility choice.

## 2016-03-13 NOTE — Progress Notes (Signed)
TF held. BP meds discussed with NP. Patient complained of stomach pain and distention. KUB performed, no abnormal findings. NP prescribed lopressor and losartan to control blood pressure. Will continue to monitor.

## 2016-03-13 NOTE — Consult Note (Signed)
[facility]  [address], [place], [place] [address] Phone: [phone] Fax : [phone]  Consultation  Referring Provider:     No ref. provider found Primary Care Physician:  Renee Rival, NP Primary Gastroenterologist:           Reason for Consultation:     Peg tube placement  Date of Admission:  02/27/2016 Date of Consultation:  03/13/2016         HPI:   Ebony [name] is a 70 y.o. female who was admitted with respiratory distress. The patient has been incubated and is scheduled to have a tracheostomy tomorrow. The patient does not appear to be able to feed herself for some time. I am now being asked to see the patient for a peg tube placement. The patient is incubated and unable to give any history or answer any questions.  Past Medical History  Diagnosis Date  . Hyperlipidemia   . Hypertension   . Diabetes mellitus type 2, uncomplicated ([place])   . Hypothyroidism   . Psoriasis   . Depression   . COPD (chronic obstructive pulmonary disease) ([place])   . Osteoporosis, post-menopausal   . Vitamin D deficiency   . Sleep apnea   . Carpal tunnel syndrome   . Breast cancer ([place])   . Asthma     Past Surgical History  Procedure Laterality Date  . Tubal ligation    . Right heel spur removed    . Carpal tunnel Right 1995  . Thumb arthroscopy Left 2000    release of a nerve to the thumb.  . Breast lumpectomy with needle localization Left 10/06/2015    Procedure: BREAST LUMPECTOMY WITH NEEDLE LOCALIZATION;  Surgeon: Hubbard Robinson, MD;  Location: [facility] ORS;  Service: General;  Laterality: Left;  . Sentinel node biopsy Left 10/06/2015    Procedure: SENTINEL NODE BIOPSY;  Surgeon: Hubbard Robinson, MD;  Location: [facility] ORS;  Service: General;  Laterality: Left;  . Breast excisional biopsy Left 10/06/2015    np for surgery lumpectomy    Prior to Admission medications   Medication Sig Start Date End Date Taking? Authorizing Provider  Adalimumab  (HUMIRA PEN-PSORIASIS STARTER) 40 MG/0.8ML PNKT Inject 40 mg into the skin every 14 (fourteen) days.    Yes Historical Provider, MD  albuterol (PROAIR HFA) 108 (90 BASE) MCG/ACT inhaler INHALE 2 PUFFS BY MOUTH EVERY 4 HOURS AS NEEDED FOR SHORTNESS OF BREATH AND/OR WHEEZING. 09/28/14  Yes Historical Provider, MD  albuterol (PROVENTIL) (2.5 MG/3ML) 0.083% nebulizer solution Inhale 1 vial using nebulizer every six hours as needed for shortness of breath and/or wheezing.   Yes Historical Provider, MD  aspirin EC 81 MG tablet Take 81 mg by mouth every morning.    Yes Historical Provider, MD  budesonide-formoterol (SYMBICORT) 160-4.5 MCG/ACT inhaler INHALE 2 PUFFS BY MOUTH TWICE DAILY 09/15/14  Yes Historical Provider, MD  buPROPion (WELLBUTRIN XL) 150 MG 24 hr tablet Take 150 mg by mouth every morning.    Yes Historical Provider, MD  citalopram (CELEXA) 40 MG tablet Take 40 mg by mouth every morning.   Yes Historical Provider, MD  clobetasol (TEMOVATE) 0.05 % external solution Apply 1 application topically daily as needed (for psoriasis.).  02/15/16  Yes Historical Provider, MD  doxycycline (VIBRAMYCIN) 100 MG capsule Take 1 capsule (100 mg total) by mouth 2 (two) times daily. 02/15/16  Yes Carrie Mew, MD  furosemide (LASIX) 20 MG tablet Take 40mg  by mouth once a day.  Yes Historical Provider, MD  glipiZIDE (GLUCOTROL) 5 MG tablet Take 1 tablet by mouth daily before lunch.  08/14/15  Yes Historical Provider, MD  insulin detemir (LEVEMIR) 100 UNIT/ML injection Inject 85 units subcutaneously twice a day   Yes Historical Provider, MD  insulin lispro (HUMALOG KWIKPEN) 100 UNIT/ML KiwkPen Inject 38-48 Units into the skin 3 (three) times daily.   Yes Historical Provider, MD  INVOKANA 100 MG TABS tablet Take 100 mg by mouth daily.  01/31/16  Yes Historical Provider, MD  letrozole (FEMARA) 2.5 MG tablet Take 1 tablet (2.5 mg total) by mouth daily. 01/14/16  Yes Lloyd Huger, MD  levothyroxine (SYNTHROID,  LEVOTHROID) 200 MCG tablet Take 200 mcg by mouth daily before breakfast. *Take along with 50 mcg to equal 250 mcg total dose*   Yes Historical Provider, MD  levothyroxine (SYNTHROID, LEVOTHROID) 50 MCG tablet Take 50 mcg by mouth daily before breakfast. *Take along with 200 mcg tablet to equal 250 mcg total dose.*   Yes Historical Provider, MD  losartan-hydrochlorothiazide (HYZAAR) 100-25 MG tablet Take 1 tablet by mouth daily. In am.   Yes Historical Provider, MD  metFORMIN (GLUCOPHAGE) 500 MG tablet Take 1,000 mg by mouth 2 (two) times daily with a meal.   Yes Historical Provider, MD  potassium chloride SA (K-DUR,KLOR-CON) 20 MEQ tablet Take 2 tablets (40 mEq total) by mouth 2 (two) times daily. 10/01/15  Yes Hubbard Robinson, MD  simvastatin (ZOCOR) 40 MG tablet Take 40 mg by mouth at bedtime.    Yes Historical Provider, MD  tiotropium (SPIRIVA) 18 MCG inhalation capsule Place 18 mcg into inhaler and inhale daily.   Yes Historical Provider, MD  levofloxacin (LEVAQUIN) 500 MG tablet Take 1 tablet (500 mg total) by mouth daily. 10/12/15   Bettey Costa, MD  predniSONE (DELTASONE) 20 MG tablet Take 2 tablets (40 mg total) by mouth daily. 02/15/16   Carrie Mew, MD    Family History  Problem Relation Age of Onset  . Diabetes Mother   . Thyroid disease Mother   . COPD Father   . Emphysema Father   . Heart disease Brother   . Diabetes Brother   . Heart disease Brother   . Thyroid disease Daughter   . Thyroid disease Son      Social History  Substance Use Topics  . Smoking status: Current Every Day Smoker -- 0.50 packs/day for 30 years    Types: Cigarettes    Last Attempt to Quit: 10/05/2015  . Smokeless tobacco: Never Used  . Alcohol Use: No    Allergies as of 02/27/2016 - Review Complete 02/27/2016  Allergen Reaction Noted  . Other Hives and Rash 09/07/2015    Review of Systems:    All systems reviewed and negative except where noted in HPI.   Physical Exam:  Vital signs in  last 24 hours: Temp:  [97.7 F (36.5 C)-99 F (37.2 C)] 98 F (36.7 C) (05/01 1200) Pulse Rate:  [59-95] 76 (05/01 1820) Resp:  [9-23] 19 (05/01 1820) BP: (107-183)/(45-89) 139/62 mmHg (05/01 1820) SpO2:  [92 %-98 %] 96 % (05/01 1820) FiO2 (%):  [40 %-50 %] 40 % (05/01 1820) Weight:  [255 lb 1.2 oz (115.7 kg)] 255 lb 1.2 oz (115.7 kg) (05/01 0500) Last BM Date: 03/13/16 General:   Lethargic intubated and on a ventilator Head:  Normocephalic and atraumatic. Eyes:   No icterus.   Conjunctiva pink. PERRLA. Ears:  Unable to test. Neck:  Supple; no masses or  thyroidomegaly Lungs: Diffuse rhonchi.  Heart:  Regular rate and rhythm;  Without murmur, clicks, rubs or gallops Abdomen:  Obese,Soft, nondistended, nontender. Normal bowel sounds. No appreciable masses or hepatomegaly.  No rebound or guarding.  Rectal:  Not performed. Msk:  Symmetrical without gross deformities.    Extremities:  Without edema, cyanosis or clubbing. Neurologic:  Intubated and unable to answer questions Skin:  Intact without significant lesions or rashes. Cervical Nodes:  No significant cervical adenopathy. Psych:  Lethargic  LAB RESULTS:  Recent Labs  03/11/16 0500 03/12/16 0453 03/13/16 0511  WBC 9.6 10.6 9.4  HGB 9.9* 9.4* 10.3*  HCT 30.8* 30.4* 32.7*  PLT 298 287 306   BMET  Recent Labs  03/12/16 0622 03/12/16 2112 03/13/16 0511  NA 146* 146* 145  K 5.4* 4.6 4.5  CL 102 99* 99*  CO2 40* 43* 41*  GLUCOSE 261* 245* 257*  BUN 43* 41* 45*  CREATININE 0.66 0.62 0.54  CALCIUM 9.5 9.6 9.8   LFT  Recent Labs  03/12/16 0622  PROT 6.2*  ALBUMIN 2.2*  AST 47*  ALT 83*  ALKPHOS 74  BILITOT 0.5   PT/INR No results for input(s): LABPROT, INR in the last 72 hours.  STUDIES: Dg Abd 1 View  03/13/2016  CLINICAL DATA:  Generalized abdominal pain. EXAM: ABDOMEN - 1 VIEW COMPARISON:  March 07, 2016. FINDINGS: The bowel gas pattern is normal. No radio-opaque calculi or other significant  radiographic abnormality are seen. Nasogastric tube tip is seen in proximal stomach. IMPRESSION: No definite evidence of bowel obstruction or ileus. Nasogastric tube tip seen in proximal stomach. Electronically Signed   By: Marijo Conception, M.D.   On: 03/13/2016 17:02   Dg Chest Port 1 View  03/13/2016  CLINICAL DATA:  Respiratory failure. EXAM: PORTABLE CHEST 1 VIEW COMPARISON:  03/12/2016, 03/09/2016.  CT 03/08/2016 . FINDINGS: Endotracheal tube, NG tube, right PICC line in stable position. Mediastinum and hilar structures normal. Stable cardiomegaly. Low lung volumes with mild multifocal pulmonary infiltrates again noted. Pulmonary infiltrate most prominent the right lung base. Tiny pleural effusions cannot be excluded . IMPRESSION: 1.  Lines and tubes in stable position. 2. Multifocal mild pulmonary infiltrates, most prominent in the right lung base again noted . Tiny pleural effusions cannot be excluded. Electronically Signed   By: Marcello Moores  Register   On: 03/13/2016 07:35   Dg Chest Port 1 View  03/12/2016  CLINICAL DATA:  Patient is being seen for respiratory failure. EXAM: PORTABLE CHEST 1 VIEW COMPARISON:  03/11/2016 FINDINGS: Support devices unchanged. Cardiac silhouette upper normal and stable. Mildly improved aeration right lower lobe with some patchy infiltrate remaining in the right middle lobe and left lower lobe. IMPRESSION: Similar to prior study with mildly improved aeration right lower lobe. Electronically Signed   By: Skipper Cliche M.D.   On: 03/12/2016 07:35      Impression / Plan:   Ebony [name] is a 70 y.o. y/o female with  respiratory failure and prolonged intubation. The patient is in need of a peg tube for feeding purposes. The patient is going for a tracheostomy tomorrow. Will reassess the timing of the peg tube placement after the tracheostomy his place.   Thank you for involving me in the care of this patient.      LOS: 15 days   Lucilla Lame, MD  03/13/2016, 6:57  PM   Note: This dictation was prepared with Dragon dictation along with smaller phrase technology. Any transcriptional errors that  result from this process are unintentional.

## 2016-03-13 NOTE — Progress Notes (Signed)
Name: Ebony [name] MRN: [id number] DOB: Apr 05, 1946    ADMISSION DATE:  02/27/2016    CHIEF COMPLAINT: Acute respiratory distress and acute COPD exacerbation  HISTORY OF PRESENT ILLNESS:   Ebony [name] is a 70 year old female with medical history of HTN, DM,chronic respiratory failure due to COPD,sleep apnea, Hyperlipidemia, hypothyroidism, depression, morbid obesity who presented to [facility] on 4/16 with increased shortness of breath . She was placed on BiPAP and transitioned to HFNC, but the patient continues to complaint of increased shortness of breath, tightness, patient had a hard time to speak in full sentences. Patient was transferred to ICU for further and PCCM team was consulted for further management. Patient denies any chest pain, nausea, vomiting, dizziness , diaphoresis.  SUBJECTIVE: Minimal vent dyssynchrony overnight. FiO2 titrated down to 40%. Awake on the vent this morning and following some commands. Diarrhea improved  VITAL SIGNS: Temp:  [98.4 F (36.9 C)-99 F (37.2 C)] 98.8 F (37.1 C) (05/01 0400) Pulse Rate:  [60-94] 66 (05/01 0600) Resp:  [9-27] 14 (05/01 0600) BP: (107-170)/(45-70) 150/53 mmHg (05/01 0600) SpO2:  [92 %-99 %] 95 % (05/01 0600) FiO2 (%):  [40 %-50 %] 40 % (05/01 0423) Weight:  [255 lb 1.2 oz (115.7 kg)] 255 lb 1.2 oz (115.7 kg) (05/01 0500)  PHYSICAL EXAMINATION: Constitutional: RASS -2, + F/C on WUA  HEENT: PERRLA, oral cavity with less secretions Lungs: prolonged expiration with faint distant wheezes; right>left Cardiovascular: RRR, S1/S2, no MRG  Abdominal: Obese, non-distended; distant bowel sounds Neurological:  No focal deficits Ext: symmetric BUE and BLE edema    Recent Labs Lab 03/12/16 0622 03/12/16 2112 03/13/16 0511  NA 146* 146* 145  K 5.4* 4.6 4.5  CL 102 99* 99*  CO2 40* 43* 41*  BUN 43* 41* 45*  CREATININE 0.66 0.62 0.54  GLUCOSE 261* 245* 257*    Recent Labs Lab 03/11/16 0500 03/12/16 0453 03/13/16 0511   HGB 9.9* 9.4* 10.3*  HCT 30.8* 30.4* 32.7*  WBC 9.6 10.6 9.4  PLT 298 287 306   CXR: interstitial prominence   STUDIES:  4/4/ 17 >> CT chest was Negative for acute pulmonary embolism.Mild nodularity in the lung bases, with resolution of nodularopacities observed in November and development of a new 6 mm rightlower lobe nodule. This is most likely inflammatory given the interval changes. 03/06/16 CT Brain without contrast.>>negative Chest x-ray 4/26 images reviewed continued hyperinflation consistent with emphysema, continued bibasilar atelectasis compared with previous images, and we'll change. --Bronchoscopy 4/25; candidemia throughout airways. Tracheobronchomalacia.  --Echo results 4/25; EF 65%; no vegetations noted but study was technically difficult.   CULTURES: 04/25 Blood>>NGTD Sputum>mold Urine>NGTD C-diff: negative  ANTIBIOTICS: Vancomycin 4/21> Meropenem 4/21> Voriconazole 4/24>  SIGNIFICANT EVENTS: 4/17>>AECOPD, Bronchitis, requiring continuous Bipap 4/18>>intermitted bipap, bridge with HFNC 02/28/18>> intubated.  4/25>> bronchoscopy, bronchomalacia, diffuse candidemia.   LINES/TUBES: ETT 4/18 PICC Triple lumen 04/24>   ASSESSMENT / PLAN  PULMONARY A: Acute on chronic hypercapnic respiratory failure COPD with severe persistent obstruction Prolonged VDRF OHS Multiple fluffy B nodular infiltrates c/w bronchopneumonia P:  Cont full vent support - settings reviewed and/or adjusted Cont vent bundle Daily SBT if/when meets criteria CXR daily and prn Nebulized steroids and bronchodilator Plan for ENT consult-trach   CARDIOVASCULAR A:  Shock, resolved Hypertension Sinus Tachycardia Hyperlipidemia P:  PRN hydralazine PRN metoprolol Monitor hemodynamics MAP goal > 65 mmHg  RENAL A:  AKI - resolved Hypervolemia Hypernatremia  Hypervolemia Mild hyperkalemia P:  Monitor BMET intermittently Monitor I/Os Correct electrolytes  as  indicated Diurese prn   GASTROINTESTINAL A:  Elevated LFTs  Diarrhea-c-diff negative P:  SUP: enteral famotidine Cont TFs Monitor LFTs intermittently D/C all stool softeners  HEMATOLOGIC A:  ICU acquired anemia - no active bleeding P:  DVT px: LMWH Monitor CBC intermittently Transfuse per usual guidelines  INFECTIOUS A: Persistent fever-improved Candiduria 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 [facility] ANP-BC Pulmonary and [place] Pager [phone]  03/13/2016   STAFF NOTE: I, Dr. [name],  have personally reviewed patient's available data, including medical history, events of note, physical examination and test results as part of my evaluation. I have discussed with NP and other care providers such as pharmacist, RN and RRT.  In addition,  I personally evaluated patient and elicited key findings   -on vent, opens eyes, mild wheezing  A:acute resp failure from severe COPD-failure to wean from vent  P:  plan for Trach, ENT consulted    The Rest per NP whose note is outlined above and that I agree with  I have personally reviewed/obtained a history, examined the patient, evaluated Pertinent laboratory and [employer]/imaging results, and  formulated the assessment and plan   The Patient requires high complexity decision making for assessment and support, frequent evaluation and  titration of therapies, application of advanced monitoring technologies and extensive interpretation of multiple databases. Critical Care Time devoted to patient care services described in this note is 35 minutes.  This Critical care time does not reflrect procedure time or supervisory time of NP but could involve care discussion time Overall, patient is critically ill, prognosis is guarded.  Patient with Multiorgan failure and at high risk for cardiac arrest and death.    [name], M.D.  [facility]  Medical Director [place] Director [facility] Cardio-Pulmonary Department

## 2016-03-13 NOTE — Progress Notes (Signed)
Spoke to NP and MD in person about patient's x-ray results and condition (patient's face red and uncomfortable, burping, complaining of fullness in abdomen but several bowel movements, gastric residual checked per NP and only 75 mL, very active bowel sounds). MD ordered tube feedings restarted. MD to put in order for medication to relive gas in patient.

## 2016-03-13 NOTE — Consult Note (Signed)
Ebony [name], Ebony [name] [id number] 06/20/1946 Riley Nearing, MD  Reason for Consult: Respiratory failure Requesting Physician: Vilinda Boehringer, MD Consulting Physician: Riley Nearing, MD  HPI: This 70 y.o. year old female was admitted on 02/27/2016 for Acute respiratory failure with hypoxia ([place]) [J96.01] COPD with exacerbation ([place]) [J44.1]. Patient was admitted for bronchitis and initially managed with BiPAP, but ultimately decompensated requiring intubation. She has remained intubated since April 18 and continues to have infiltrates with question of aspergillosis. Tracheostomy has been requested.  Medications:  Current Facility-Administered Medications  Medication Dose Route Frequency Provider Last Rate Last Dose  . 0.9 %  sodium chloride infusion  250 mL Intravenous PRN Dustin Flock, MD      . acetaminophen (TYLENOL) solution 500 mg  500 mg Per Tube Q6H Laverle Hobby, MD   500 mg at 03/13/16 0232  . antiseptic oral rinse solution (CORINZ)  7 mL Mouth Rinse 10 times per day Vishal Mungal, MD   7 mL at 03/13/16 0600  . aspirin chewable tablet 81 mg  81 mg Per NG tube Daily Mikael Spray, NP   81 mg at 03/12/16 1047  . bisacodyl (DULCOLAX) suppository 10 mg  10 mg Rectal Daily PRN Mikael Spray, NP      . budesonide (PULMICORT) nebulizer solution 0.25 mg  0.25 mg Nebulization 4 times per day Wilhelmina Mcardle, MD   0.25 mg at 03/13/16 0716  . chlorhexidine gluconate (SAGE KIT) (PERIDEX) 0.12 % solution 15 mL  15 mL Mouth Rinse BID Vishal Mungal, MD   15 mL at 03/12/16 2000  . citalopram (CELEXA) tablet 20 mg  20 mg Per NG tube Daily Laverle Hobby, MD   20 mg at 03/12/16 1047  . famotidine (PEPCID) 40 MG/5ML suspension 20 mg  20 mg Oral BID Laverle Hobby, MD   20 mg at 03/12/16 2109  . feeding supplement (VITAL HIGH PROTEIN) liquid 1,000 mL  1,000 mL Per Tube Continuous Vishal Mungal, MD 60 mL/hr at 03/13/16 0600 1,000 mL at 03/13/16 0600  . fentaNYL (SUBLIMAZE) bolus  via infusion 25-50 mcg  25-50 mcg Intravenous Q1H PRN Wilhelmina Mcardle, MD   50 mcg at 03/13/16 0117  . fentaNYL 25107mg in NS 2521m(1041mml) infusion-PREMIX  25-400 mcg/hr Intravenous Continuous DavWilhelmina McardleD 10 mL/hr at 03/13/16 0600 100 mcg/hr at 03/13/16 0600  . free water 200 mL  200 mL Per Tube Q4H PraLaverle HobbyD   200 mL at 03/13/16 0400  . hydrALAZINE (APRESOLINE) injection 10-40 mg  10-40 mg Intravenous Q4H PRN DavWilhelmina McardleD   20 mg at 03/12/16 1833  . insulin aspart (novoLOG) injection 0-20 Units  0-20 Units Subcutaneous Q4H DavWilhelmina McardleD   7 Units at 03/13/16 0445  . insulin aspart (novoLOG) injection 7 Units  7 Units Subcutaneous Q4H MagMikael SprayP   7 Units at 03/13/16 0445  . insulin glargine (LANTUS) injection 30 Units  30 Units Subcutaneous BID DavWilhelmina McardleD   30 Units at 03/12/16 2245  . letrozole (FECont[facility] Medical Centerablet 2.5 mg  2.5 mg Oral Daily MagMikael SprayP   2.5 mg at 03/12/16 1047  . levalbuterol (XOPENEX) nebulizer solution 0.63 mg  0.63 mg Nebulization Q3H PRN DavWilhelmina McardleD   0.63 mg at 03/13/16 0157  . levothyroxine (SYNTHROID, LEVOTHROID) tablet 250 mcg  250 mcg Per NG tube QAC breakfast DavWilhelmina McardleD   250 mcg at 03/13/16 0544  . meropenem (MERREM)  1 g in sodium chloride 0.9 % 100 mL IVPB  1 g Intravenous Q8H Vishal Mungal, MD   1 g at 03/13/16 0512  . methylPREDNISolone sodium succinate (SOLU-MEDROL) 40 mg/mL injection 40 mg  40 mg Intravenous Q12H Wilhelmina Mcardle, MD   40 mg at 03/13/16 0542  . metoprolol (LOPRESSOR) injection 2.5-5 mg  2.5-5 mg Intravenous Q3H PRN Wilhelmina Mcardle, MD      . midazolam (VERSED) injection 1 mg  1 mg Intravenous Q2H PRN Vilinda Boehringer, MD   1 mg at 03/12/16 1908  . ondansetron (ZOFRAN) tablet 4 mg  4 mg Oral Q6H PRN Dustin Flock, MD       Or  . ondansetron (ZOFRAN) injection 4 mg  4 mg Intravenous Q6H PRN Dustin Flock, MD      . polyethylene glycol (MIRALAX / GLYCOLAX) packet 17 g   17 g Per Tube Daily PRN Laverle Hobby, MD      . sennosides (SENOKOT) 8.8 MG/5ML syrup 5 mL  5 mL Per Tube Daily Laverle Hobby, MD   5 mL at 03/12/16 1047  . sodium chloride flush (NS) 0.9 % injection 3 mL  3 mL Intravenous PRN Dustin Flock, MD      . vancomycin (VANCOCIN) 1,250 mg in sodium chloride 0.9 % 250 mL IVPB  1,250 mg Intravenous Q12H Vishal Mungal, MD   1,250 mg at 03/12/16 2100  . voriconazole (VFEND) 310 mg in sodium chloride 0.9 % 100 mL IVPB  4 mg/kg (Adjusted) Intravenous Q12H Vishal Mungal, MD   310 mg at 03/13/16 0629  .  Medications Prior to Admission  Medication Sig Dispense Refill  . Adalimumab (HUMIRA PEN-PSORIASIS STARTER) 40 MG/0.8ML PNKT Inject 40 mg into the skin every 14 (fourteen) days.     Marland Kitchen albuterol (PROAIR HFA) 108 (90 BASE) MCG/ACT inhaler INHALE 2 PUFFS BY MOUTH EVERY 4 HOURS AS NEEDED FOR SHORTNESS OF BREATH AND/OR WHEEZING.    Marland Kitchen albuterol (PROVENTIL) (2.5 MG/3ML) 0.083% nebulizer solution Inhale 1 vial using nebulizer every six hours as needed for shortness of breath and/or wheezing.    Marland Kitchen aspirin EC 81 MG tablet Take 81 mg by mouth every morning.     . budesonide-formoterol (SYMBICORT) 160-4.5 MCG/ACT inhaler INHALE 2 PUFFS BY MOUTH TWICE DAILY    . buPROPion (WELLBUTRIN XL) 150 MG 24 hr tablet Take 150 mg by mouth every morning.     . citalopram (CELEXA) 40 MG tablet Take 40 mg by mouth every morning.    . clobetasol (TEMOVATE) 0.05 % external solution Apply 1 application topically daily as needed (for psoriasis.).     Marland Kitchen doxycycline (VIBRAMYCIN) 100 MG capsule Take 1 capsule (100 mg total) by mouth 2 (two) times daily. 28 capsule 0  . furosemide (LASIX) 20 MG tablet Take 96m by mouth once a day.    .Marland KitchenglipiZIDE (GLUCOTROL) 5 MG tablet Take 1 tablet by mouth daily before lunch.     . insulin detemir (LEVEMIR) 100 UNIT/ML injection Inject 85 units subcutaneously twice a day    . insulin lispro (HUMALOG KWIKPEN) 100 UNIT/ML KiwkPen Inject 38-48  Units into the skin 3 (three) times daily.    . INVOKANA 100 MG TABS tablet Take 100 mg by mouth daily.     .Marland Kitchenletrozole (FEMARA) 2.5 MG tablet Take 1 tablet (2.5 mg total) by mouth daily. 90 tablet 3  . levothyroxine (SYNTHROID, LEVOTHROID) 200 MCG tablet Take 200 mcg by mouth daily before breakfast. *Take along with 50 mcg  to equal 250 mcg total dose*    . levothyroxine (SYNTHROID, LEVOTHROID) 50 MCG tablet Take 50 mcg by mouth daily before breakfast. *Take along with 200 mcg tablet to equal 250 mcg total dose.*    . losartan-hydrochlorothiazide (HYZAAR) 100-25 MG tablet Take 1 tablet by mouth daily. In am.    . metFORMIN (GLUCOPHAGE) 500 MG tablet Take 1,000 mg by mouth 2 (two) times daily with a meal.    . potassium chloride SA (K-DUR,KLOR-CON) 20 MEQ tablet Take 2 tablets (40 mEq total) by mouth 2 (two) times daily. 20 tablet 0  . simvastatin (ZOCOR) 40 MG tablet Take 40 mg by mouth at bedtime.     Marland Kitchen tiotropium (SPIRIVA) 18 MCG inhalation capsule Place 18 mcg into inhaler and inhale daily.    Marland Kitchen levofloxacin (LEVAQUIN) 500 MG tablet Take 1 tablet (500 mg total) by mouth daily. 5 tablet 0  . predniSONE (DELTASONE) 20 MG tablet Take 2 tablets (40 mg total) by mouth daily. 8 tablet 0    Allergies:  Allergies  Allergen Reactions  . Other Hives and Rash    Shellfish. Shellfish Pt reports no allergic reaction to iodine or betadine.  . Shellfish Allergy Hives and Rash    PMH:  Past Medical History  Diagnosis Date  . Hyperlipidemia   . Hypertension   . Diabetes mellitus type 2, uncomplicated ([place])   . Hypothyroidism   . Psoriasis   . Depression   . COPD (chronic obstructive pulmonary disease) ([place])   . Osteoporosis, post-menopausal   . Vitamin D deficiency   . Sleep apnea   . Carpal tunnel syndrome   . Breast cancer ([place])   . Asthma     Fam Hx:  Family History  Problem Relation Age of Onset  . Diabetes Mother   . Thyroid disease Mother   . COPD Father   . Emphysema Father    . Heart disease Brother   . Diabetes Brother   . Heart disease Brother   . Thyroid disease Daughter   . Thyroid disease Son     Soc Hx:  Social History   Social History  . Marital Status: Married    Spouse Name: N/A  . Number of Children: N/A  . Years of Education: N/A   Occupational History  . Not on file.   Social History Main Topics  . Smoking status: Current Every Day Smoker -- 0.50 packs/day for 30 years    Types: Cigarettes    Last Attempt to Quit: 10/05/2015  . Smokeless tobacco: Never Used  . Alcohol Use: No  . Drug Use: No  . Sexual Activity: Not on file   Other Topics Concern  . Not on file   Social History Narrative    PSH:  Past Surgical History  Procedure Laterality Date  . Tubal ligation    . Right heel spur removed    . Carpal tunnel Right 1995  . Thumb arthroscopy Left 2000    release of a nerve to the thumb.  . Breast lumpectomy with needle localization Left 10/06/2015    Procedure: BREAST LUMPECTOMY WITH NEEDLE LOCALIZATION;  Surgeon: Hubbard Robinson, MD;  Location: [facility] ORS;  Service: General;  Laterality: Left;  . Sentinel node biopsy Left 10/06/2015    Procedure: SENTINEL NODE BIOPSY;  Surgeon: Hubbard Robinson, MD;  Location: [facility] ORS;  Service: General;  Laterality: Left;  . Breast excisional biopsy Left 10/06/2015    np for surgery lumpectomy  . Procedures since  admission: No admission procedures for hospital encounter.  ROS: Review of systems normal other than 12 systems except per HPI.  PHYSICAL EXAM  Vitals: Blood pressure 150/53, pulse 66, temperature 98.8 F (37.1 C), temperature source Axillary, resp. rate 14, height 5' 3"  (1.6 m), weight 115.7 kg (255 lb 1.2 oz), SpO2 95 %.. General: Well-developed, Morbidly obese female intubated and sedated.  Mood: Patient is intubated and sedated Orientation: Unable to assess  Vocal Quality: Unable to assess  head and Face: NCAT. No facial asymmetry. No visible skin lesions. No  significant facial scars. No tenderness with sinus percussion. Facial strength normal and symmetric. Ears: External ears with normal landmarks, no lesions. External auditory canals are impacted with cerumen. Could not visualize the tympanic membranes.  Hearing: Unable to assess. Nose: External nose normal with midline dorsum and no lesions or deformity. Nasal Cavity reveals essentially midline septum with normal inferior turbinates. No significant mucosal congestion or erythema. Nasal secretions are minimal and clear. No polyps seen on anterior rhinoscopy. Oral Cavity/ Oropharynx: Lips are normal with no lesions. Teeth no frank dental caries. Exam is otherwise limited as the patient cannot cooperate with opening her mouth. She is orally intubated.  Indirect Laryngoscopy/Nasopharyngoscopy: Visualization of the larynx, hypopharynx and nasopharynx is not possible in this setting with routine examination. Neck: Supple and symmetric with no palpable masses, tenderness or crepitance. The trachea is midline. Thyroid gland is soft, nontender and symmetric with no masses or enlargement. Parotid and submandibular glands are soft, nontender and symmetric, without masses. Lymphatic: Cervical lymph nodes are without palpable lymphadenopathy or tenderness. Respiratory: Breathing with ventilatory support Cardiovascular: Carotid pulse shows regular rate and rhythm Neurologic: Patient cannot cooperate to provide assessment Eyes: Unable to assess ocular motility. PERRLA. No visible nystagmus.  MEDICAL DECISION MAKING: Data Review:  Results for orders placed or performed during the hospital encounter of 02/27/16 (from the past 48 hour(s))  Glucose, capillary     Status: Abnormal   Collection Time: 03/11/16  8:20 AM  Result Value Ref Range   Glucose-Capillary 141 (H) 65 - 99 mg/dL  Glucose, capillary     Status: Abnormal   Collection Time: 03/11/16  9:25 AM  Result Value Ref Range   Glucose-Capillary 160 (H) 65 -  99 mg/dL  Glucose, capillary     Status: Abnormal   Collection Time: 03/11/16 10:25 AM  Result Value Ref Range   Glucose-Capillary 154 (H) 65 - 99 mg/dL  Glucose, capillary     Status: Abnormal   Collection Time: 03/11/16 11:30 AM  Result Value Ref Range   Glucose-Capillary 148 (H) 65 - 99 mg/dL  Glucose, capillary     Status: Abnormal   Collection Time: 03/11/16 12:24 PM  Result Value Ref Range   Glucose-Capillary 119 (H) 65 - 99 mg/dL  C difficile quick scan w PCR reflex     Status: None   Collection Time: 03/11/16  1:13 PM  Result Value Ref Range   C Diff antigen NEGATIVE NEGATIVE   C Diff toxin NEGATIVE NEGATIVE   C Diff interpretation Negative for C. difficile   Glucose, capillary     Status: Abnormal   Collection Time: 03/11/16  1:36 PM  Result Value Ref Range   Glucose-Capillary 151 (H) 65 - 99 mg/dL  Glucose, capillary     Status: Abnormal   Collection Time: 03/11/16  2:39 PM  Result Value Ref Range   Glucose-Capillary 154 (H) 65 - 99 mg/dL  Glucose, capillary     Status:  Abnormal   Collection Time: 03/11/16  3:40 PM  Result Value Ref Range   Glucose-Capillary 148 (H) 65 - 99 mg/dL  Glucose, capillary     Status: Abnormal   Collection Time: 03/11/16  7:37 PM  Result Value Ref Range   Glucose-Capillary 183 (H) 65 - 99 mg/dL   Comment 1 Notify RN   Glucose, capillary     Status: Abnormal   Collection Time: 03/11/16 11:29 PM  Result Value Ref Range   Glucose-Capillary 240 (H) 65 - 99 mg/dL   Comment 1 Notify RN   Glucose, capillary     Status: Abnormal   Collection Time: 03/12/16  3:32 AM  Result Value Ref Range   Glucose-Capillary 250 (H) 65 - 99 mg/dL   Comment 1 Notify RN   CBC     Status: Abnormal   Collection Time: 03/12/16  4:53 AM  Result Value Ref Range   WBC 10.6 3.6 - 11.0 K/uL   RBC 3.20 (L) 3.80 - 5.20 MIL/uL   Hemoglobin 9.4 (L) 12.0 - 16.0 g/dL   HCT 30.4 (L) 35.0 - 47.0 %   MCV 94.9 80.0 - 100.0 fL   MCH 29.5 26.0 - 34.0 pg   MCHC 31.1 (L)  32.0 - 36.0 g/dL   RDW 13.9 11.5 - 14.5 %   Platelets 287 150 - 440 K/uL  Comprehensive metabolic panel     Status: Abnormal   Collection Time: 03/12/16  6:22 AM  Result Value Ref Range   Sodium 146 (H) 135 - 145 mmol/L   Potassium 5.4 (H) 3.5 - 5.1 mmol/L   Chloride 102 101 - 111 mmol/L   CO2 40 (H) 22 - 32 mmol/L   Glucose, Bld 261 (H) 65 - 99 mg/dL   BUN 43 (H) 6 - 20 mg/dL   Creatinine, Ser 0.66 0.44 - 1.00 mg/dL   Calcium 9.5 8.9 - 10.3 mg/dL   Total Protein 6.2 (L) 6.5 - 8.1 g/dL   Albumin 2.2 (L) 3.5 - 5.0 g/dL   AST 47 (H) 15 - 41 U/L   ALT 83 (H) 14 - 54 U/L   Alkaline Phosphatase 74 38 - 126 U/L   Total Bilirubin 0.5 0.3 - 1.2 mg/dL   GFR calc non Af Amer >60 >60 mL/min   GFR calc Af Amer >60 >60 mL/min    Comment: (NOTE) The eGFR has been calculated using the CKD EPI equation. This calculation has not been validated in all clinical situations. eGFR's persistently <60 mL/min signify possible Chronic Kidney Disease.    Anion gap 4 (L) 5 - 15  Glucose, capillary     Status: Abnormal   Collection Time: 03/12/16  7:04 AM  Result Value Ref Range   Glucose-Capillary 253 (H) 65 - 99 mg/dL  Glucose, capillary     Status: Abnormal   Collection Time: 03/12/16 12:08 PM  Result Value Ref Range   Glucose-Capillary 279 (H) 65 - 99 mg/dL  Glucose, capillary     Status: Abnormal   Collection Time: 03/12/16  4:17 PM  Result Value Ref Range   Glucose-Capillary 252 (H) 65 - 99 mg/dL  Glucose, capillary     Status: Abnormal   Collection Time: 03/12/16  7:39 PM  Result Value Ref Range   Glucose-Capillary 250 (H) 65 - 99 mg/dL  Basic metabolic panel     Status: Abnormal   Collection Time: 03/12/16  9:12 PM  Result Value Ref Range   Sodium 146 (H) 135 - 145  mmol/L   Potassium 4.6 3.5 - 5.1 mmol/L   Chloride 99 (L) 101 - 111 mmol/L   CO2 43 (H) 22 - 32 mmol/L   Glucose, Bld 245 (H) 65 - 99 mg/dL   BUN 41 (H) 6 - 20 mg/dL   Creatinine, Ser 0.62 0.44 - 1.00 mg/dL   Calcium 9.6  8.9 - 10.3 mg/dL   GFR calc non Af Amer >60 >60 mL/min   GFR calc Af Amer >60 >60 mL/min    Comment: (NOTE) The eGFR has been calculated using the CKD EPI equation. This calculation has not been validated in all clinical situations. eGFR's persistently <60 mL/min signify possible Chronic Kidney Disease.    Anion gap 4 (L) 5 - 15  Magnesium     Status: None   Collection Time: 03/12/16  9:12 PM  Result Value Ref Range   Magnesium 2.0 1.7 - 2.4 mg/dL  Glucose, capillary     Status: Abnormal   Collection Time: 03/12/16 11:54 PM  Result Value Ref Range   Glucose-Capillary 216 (H) 65 - 99 mg/dL  Glucose, capillary     Status: Abnormal   Collection Time: 03/13/16  4:17 AM  Result Value Ref Range   Glucose-Capillary 226 (H) 65 - 99 mg/dL  TSH     Status: Abnormal   Collection Time: 03/13/16  5:11 AM  Result Value Ref Range   TSH 0.084 (L) 0.350 - 4.500 uIU/mL  Basic metabolic panel     Status: Abnormal   Collection Time: 03/13/16  5:11 AM  Result Value Ref Range   Sodium 145 135 - 145 mmol/L   Potassium 4.5 3.5 - 5.1 mmol/L   Chloride 99 (L) 101 - 111 mmol/L   CO2 41 (H) 22 - 32 mmol/L   Glucose, Bld 257 (H) 65 - 99 mg/dL   BUN 45 (H) 6 - 20 mg/dL   Creatinine, Ser 0.54 0.44 - 1.00 mg/dL   Calcium 9.8 8.9 - 10.3 mg/dL   GFR calc non Af Amer >60 >60 mL/min   GFR calc Af Amer >60 >60 mL/min    Comment: (NOTE) The eGFR has been calculated using the CKD EPI equation. This calculation has not been validated in all clinical situations. eGFR's persistently <60 mL/min signify possible Chronic Kidney Disease.    Anion gap 5 5 - 15  CBC     Status: Abnormal   Collection Time: 03/13/16  5:11 AM  Result Value Ref Range   WBC 9.4 3.6 - 11.0 K/uL   RBC 3.45 (L) 3.80 - 5.20 MIL/uL   Hemoglobin 10.3 (L) 12.0 - 16.0 g/dL   HCT 32.7 (L) 35.0 - 47.0 %   MCV 94.8 80.0 - 100.0 fL   MCH 29.8 26.0 - 34.0 pg   MCHC 31.5 (L) 32.0 - 36.0 g/dL   RDW 13.7 11.5 - 14.5 %   Platelets 306 150 - 440  K/uL  Glucose, capillary     Status: Abnormal   Collection Time: 03/13/16  7:52 AM  Result Value Ref Range   Glucose-Capillary 201 (H) 65 - 99 mg/dL  . US Venous Img Lower Bilateral  03/11/2016  CLINICAL DATA:  Four lower extremity edema for 13 days EXAM: BILATERAL LOWER EXTREMITY VENOUS DUPLEX ULTRASOUND TECHNIQUE: Doppler venous assessment of the bilateral lower extremity deep venous system was performed, including characterization of spectral flow, compressibility, and phasicity. COMPARISON:  None. FINDINGS: There is complete compressibility of the common femoral, femoral, and popliteal veins. Doppler analysis demonstrates respiratory  phasicity and augmentation of flow with calf compression. No obvious calf vein or superficial vein thrombosis. IMPRESSION: No evidence of lower extremity DVT. Electronically Signed   By: Marybelle Killings M.D.   On: 03/11/2016 08:17   Dg Chest Port 1 View  03/13/2016  CLINICAL DATA:  Respiratory failure. EXAM: PORTABLE CHEST 1 VIEW COMPARISON:  03/12/2016, 03/09/2016.  CT 03/08/2016 . FINDINGS: Endotracheal tube, NG tube, right PICC line in stable position. Mediastinum and hilar structures normal. Stable cardiomegaly. Low lung volumes with mild multifocal pulmonary infiltrates again noted. Pulmonary infiltrate most prominent the right lung base. Tiny pleural effusions cannot be excluded . IMPRESSION: 1.  Lines and tubes in stable position. 2. Multifocal mild pulmonary infiltrates, most prominent in the right lung base again noted . Tiny pleural effusions cannot be excluded. Electronically Signed   By: Marcello Moores  Register   On: 03/13/2016 07:35   Dg Chest Port 1 View  03/12/2016  CLINICAL DATA:  Patient is being seen for respiratory failure. EXAM: PORTABLE CHEST 1 VIEW COMPARISON:  03/11/2016 FINDINGS: Support devices unchanged. Cardiac silhouette upper normal and stable. Mildly improved aeration right lower lobe with some patchy infiltrate remaining in the right middle lobe and  left lower lobe. IMPRESSION: Similar to prior study with mildly improved aeration right lower lobe. Electronically Signed   By: Skipper Cliche M.D.   On: 03/12/2016 07:35    ASSESSMENT: Patient with respiratory failure and a history of chronic COPD, morbid obesity and obstructive sleep apnea. The managing team feels she remains vent dependent and unlikely to wean further from the vent at this time. Tracheostomy has been requested.  PLAN: We will discuss the tracheostomy with Dr. Tami Ribas and see if he could added on to the surgical schedule tomorrow, as another tracheostomy had canceled. Otherwise try to get this done at some point this week. Coags will be assessed on the day of surgery given some concerns of elevated liver enzymes. The primary service has stopped her anticoagulation.  Riley Nearing, MD 03/13/2016 8:01 AM

## 2016-03-13 NOTE — Progress Notes (Signed)
Nutrition Follow-up  DOCUMENTATION CODES:   Morbid obesity  INTERVENTION:  -TF to be held at midnight for scheduled procedure; recommend continuing current TF regimen until midnight and restarting postop via NG. With TF being held at midnight, discussed current insulin regimen with MD during ICU rounds, MD adjusting regimen. Continue to assess  NUTRITION DIAGNOSIS:   Inadequate oral intake related to acute illness as evidenced by NPO status.  Being addressed via TF  GOAL:   Provide needs based on ASPEN/SCCM guidelines  MONITOR:   Vent status, TF tolerance, Labs, Weight trends, I & O's  REASON FOR ASSESSMENT:   Consult, Ventilator Enteral/tube feeding initiation and management  ASSESSMENT:    70 yo female admitted with acute respiratory failure duw to acute on chronic COPD exacerbation with associated acute bronchitis, initially on Bipap, required intubation on 02/29/16  Patient is currently intubated on ventilator support; noted ENT consulted for trach, tentative placement tomorrow; tolerating Vital High Protein at rate of 60 ml/hr MV: 8 L/min Temp (24hrs), Avg:98.5 F (36.9 C), Min:97.7 F (36.5 C), Max:99 F (37.2 C)  Diet Order:  Diet NPO time specified   Digestive System: abdomen soft, obese; BS active  Skin:  Reviewed, no issues  Last BM:  03/10/16   Labs: sodium improved (145 this AM)  Glucose Profile:  Recent Labs  03/12/16 2354 03/13/16 0417 03/13/16 0752  GLUCAP 216* 226* 201*   Meds: ss novolog, scheduled novolog q  4 hours, lantus BID, solumedrol  Height:   Ht Readings from Last 1 Encounters:  03/08/16 5\' 3"  (1.6 m)    Weight:   Wt Readings from Last 1 Encounters:  03/13/16 255 lb 1.2 oz (115.7 kg)    Ideal Body Weight:  52.3 kg  BMI:  Body mass index is 45.2 kg/(m^2).  Estimated Nutritional Needs:   Kcal:  [id number] kcals (11-14 kcals/kg current wt of 102.5 kg for BMI >30 using ASPEN guidelines)  Protein:  >/= 104 g (2.0 g/kg)  IBW  Fluid:  >/= 1.5 L  EDUCATION NEEDS:   Education needs no appropriate at this time  [place], [place], [place] [phone] Pager  [phone] Weekend/On-Call Pager

## 2016-03-13 NOTE — Progress Notes (Signed)
NP notified of increased BP of 183/61. IV hydralaine administered. Verbal order to give IV lopressor as well. Will continue to monitor. Jerl Santos RN

## 2016-03-13 NOTE — Progress Notes (Addendum)
Pharmacy Antimicrobial Note  Ebony [name] is a 70 y.o. female admitted on 02/27/2016 with respiratory failure.  Pharmacy has been consulted for vancomycin, meropenem, and voriconazole dosing.  This is day #16 of antimicrobial therapy. Patient is currently on meropenem (day #11), vancomycin (day #11 ), and voriconazole (day #9). ID has been consulted and is following.   Plan: After discussion with Dr. Mortimer Fries, will continue current antimicrobial regimen pending ID decisions.   Will continue current regimen of Vancomycin 1250 mg IV q12 hours. Will follow renal function.   Continue meropenem 1 g IV q8h  Continue current orders for voriconazole. Patient already received the two loading doses of 6 mg/kg (430 mg). Continue with current orders for 4 mg/kg (3102 mg) IV q12h. Dosed using adjusted body weight since patient is obese.   Height: 5\' 3"  (160 cm) Weight: 255 lb 1.2 oz (115.7 kg) IBW/kg (Calculated) : 52.4  Temp (24hrs), Avg:98.5 F (36.9 C), Min:97.7 F (36.5 C), Max:99 F (37.2 C)   Recent Labs Lab 03/09/16 0429 03/09/16 1949 03/10/16 0521 03/11/16 0500 03/11/16 0732 03/12/16 0453 03/12/16 0622 03/12/16 2112 03/13/16 0511  WBC 14.1*  --  12.9* 9.6  --  10.6  --   --  9.4  CREATININE 0.65  --  0.57 0.65  --   --  0.66 0.62 0.54  VANCOTROUGH  --  18  --   --  17  --   --   --   --     Estimated Creatinine Clearance: 83.7 mL/min (by C-G formula based on Cr of 0.54).    Allergies  Allergen Reactions  . Other Hives and Rash    Shellfish. Shellfish Pt reports no allergic reaction to iodine or betadine.  . Shellfish Allergy Hives and Rash   Antimicrobials this admission: levofloxacin 4/16 >> 4/20 meropenem 4/21 >>  Vancomycin 4/21 >> Voriconazole 4/24 >>  Dose adjustments this admission: 4/24 Vancomycin changed from 1500 mg IV q8h to q12h 4/26 vancomycin decreased to 1250 mg iv q 12 h due to trough of 26 mcg/ml 4/28 voriconazole increased to 310 mg q 12 h based  on increased weight (4 mg/kg)  Microbiology results: 4/29 C diff: negative 4/29 UCx: NGTD  4/27 BCx: NGTD x 2 4/25 BAL: moderate growth mold 4/20 BCx: negative 4/20 UCx: Candida krusei  4/20 TA: moderate growth mold 4/17 MRSA PCR: negative  Thank you for allowing pharmacy to be a part of this patient's care.  Ulice Dash D, PharmD Clinical Pharmacist 03/13/2016 11:52 AM

## 2016-03-14 ENCOUNTER — Inpatient Hospital Stay: Payer: Medicare Other | Admitting: Certified Registered Nurse Anesthetist

## 2016-03-14 ENCOUNTER — Encounter
Admission: EM | Disposition: A | Discharge status: Other Acute Inpatient Hospital | Payer: Self-pay | Source: Home / Self Care | Attending: Internal Medicine

## 2016-03-14 ENCOUNTER — Encounter: Payer: Self-pay | Admitting: Otolaryngology

## 2016-03-14 DIAGNOSIS — E441 Mild protein-calorie malnutrition: Secondary | ICD-10-CM

## 2016-03-14 HISTORY — PX: TRACHEOSTOMY TUBE PLACEMENT: SHX814

## 2016-03-14 LAB — PROTIME-INR
INR: 1.18
Prothrombin Time: 15.2 seconds — ABNORMAL HIGH (ref 11.4–15.0)

## 2016-03-14 LAB — GLUCOSE, CAPILLARY
GLUCOSE-CAPILLARY: 111 mg/dL — AB (ref 65–99)
GLUCOSE-CAPILLARY: 129 mg/dL — AB (ref 65–99)
GLUCOSE-CAPILLARY: 134 mg/dL — AB (ref 65–99)
GLUCOSE-CAPILLARY: 167 mg/dL — AB (ref 65–99)
GLUCOSE-CAPILLARY: 94 mg/dL (ref 65–99)
Glucose-Capillary: 65 mg/dL (ref 65–99)
Glucose-Capillary: 82 mg/dL (ref 65–99)

## 2016-03-14 LAB — CULTURE, BLOOD (ROUTINE X 2)
CULTURE: NO GROWTH
Culture: NO GROWTH

## 2016-03-14 SURGERY — CREATION, TRACHEOSTOMY
Anesthesia: General | Wound class: Clean Contaminated

## 2016-03-14 MED ORDER — ALBUTEROL SULFATE HFA 108 (90 BASE) MCG/ACT IN AERS
INHALATION_SPRAY | RESPIRATORY_TRACT | Status: DC | PRN
  Administered 2016-03-14: 5 via RESPIRATORY_TRACT

## 2016-03-14 MED ORDER — LACTATED RINGERS IV SOLN
INTRAVENOUS | Status: DC | PRN
  Administered 2016-03-14: 08:00:00 via INTRAVENOUS

## 2016-03-14 MED ORDER — METHYLPREDNISOLONE SODIUM SUCC 40 MG IJ SOLR
20.0000 mg | Freq: Every day | INTRAMUSCULAR | Status: AC
  Administered 2016-03-15: 20 mg via INTRAVENOUS
  Filled 2016-03-14: qty 1

## 2016-03-14 MED ORDER — FENTANYL CITRATE (PF) 100 MCG/2ML IJ SOLN
INTRAMUSCULAR | Status: DC | PRN
  Administered 2016-03-14: 100 ug via INTRAVENOUS

## 2016-03-14 MED ORDER — METHYLPREDNISOLONE SODIUM SUCC 40 MG IJ SOLR
10.0000 mg | Freq: Every day | INTRAMUSCULAR | Status: DC
  Administered 2016-03-16: 10 mg via INTRAVENOUS
  Filled 2016-03-14: qty 1

## 2016-03-14 MED ORDER — ROCURONIUM BROMIDE 100 MG/10ML IV SOLN
INTRAVENOUS | Status: DC | PRN
  Administered 2016-03-14: 20 mg via INTRAVENOUS
  Administered 2016-03-14 (×2): 30 mg via INTRAVENOUS

## 2016-03-14 MED ORDER — LEVOTHYROXINE SODIUM 100 MCG IV SOLR
112.5000 ug | Freq: Every day | INTRAVENOUS | Status: DC
  Administered 2016-03-14 – 2016-03-22 (×9): 112.5 ug via INTRAVENOUS
  Filled 2016-03-14 (×9): qty 10

## 2016-03-14 MED ORDER — EPHEDRINE SULFATE 50 MG/ML IJ SOLN
INTRAMUSCULAR | Status: DC | PRN
  Administered 2016-03-14: 5 mg via INTRAVENOUS

## 2016-03-14 MED ORDER — LIDOCAINE-EPINEPHRINE 1 %-1:100000 IJ SOLN
INTRAMUSCULAR | Status: DC | PRN
  Administered 2016-03-14: 9 mL

## 2016-03-14 MED ORDER — DEXTROSE 50 % IV SOLN
25.0000 mL | Freq: Once | INTRAVENOUS | Status: AC
  Administered 2016-03-14: 25 mL via INTRAVENOUS
  Filled 2016-03-14: qty 50

## 2016-03-14 MED ORDER — PHENYLEPHRINE HCL 10 MG/ML IJ SOLN
INTRAMUSCULAR | Status: DC | PRN
  Administered 2016-03-14 (×3): 100 ug via INTRAVENOUS

## 2016-03-14 MED ORDER — FAMOTIDINE IN NACL 20-0.9 MG/50ML-% IV SOLN
20.0000 mg | Freq: Two times a day (BID) | INTRAVENOUS | Status: DC
  Administered 2016-03-14 – 2016-03-19 (×11): 20 mg via INTRAVENOUS
  Filled 2016-03-14 (×14): qty 50

## 2016-03-14 SURGICAL SUPPLY — 33 items
BLADE SURG 15 STRL LF DISP TIS (BLADE) ×1 IMPLANT
BLADE SURG 15 STRL SS (BLADE) ×3
BLADE SURG SZ11 CARB STEEL (BLADE) ×3 IMPLANT
CANISTER SUCT 1200ML W/VALVE (MISCELLANEOUS) ×3 IMPLANT
CORD BIP STRL DISP 12FT (MISCELLANEOUS) ×3 IMPLANT
ELECT REM PT RETURN 9FT ADLT (ELECTROSURGICAL) ×3
ELECTRODE REM PT RTRN 9FT ADLT (ELECTROSURGICAL) ×1 IMPLANT
FORCEPS JEWEL BIP 4-3/4 STR (INSTRUMENTS) ×3 IMPLANT
GAUZE IODOFORM PACK 1/2 7832 (GAUZE/BANDAGES/DRESSINGS) ×3 IMPLANT
GLOVE BIO SURGEON STRL SZ7.5 (GLOVE) ×5 IMPLANT
GLOVE EXAM NITRILE PF MED BLUE (GLOVE) ×2 IMPLANT
GLOVE SKINSENSE STRL SZ6.0 (GLOVE) ×2 IMPLANT
GOWN STRL REUS W/ TWL LRG LVL3 (GOWN DISPOSABLE) ×2 IMPLANT
GOWN STRL REUS W/TWL LRG LVL3 (GOWN DISPOSABLE) ×6
HARMONIC SCALPEL FOCUS (MISCELLANEOUS) ×3 IMPLANT
HEMOSTAT SURGICEL 2X3 (HEMOSTASIS) ×5 IMPLANT
KIT RM TURNOVER STRD PROC AR (KITS) ×3 IMPLANT
LABEL OR SOLS (LABEL) ×3 IMPLANT
NS IRRIG 500ML POUR BTL (IV SOLUTION) ×3 IMPLANT
PACK HEAD/NECK (MISCELLANEOUS) ×3 IMPLANT
SPONGE DRAIN TRACH 4X4 STRL 2S (GAUZE/BANDAGES/DRESSINGS) ×3 IMPLANT
SPONGE KITTNER 5P (MISCELLANEOUS) ×3 IMPLANT
SUCTION FRAZIER HANDLE 10FR (MISCELLANEOUS) ×2
SUCTION TUBE FRAZIER 10FR DISP (MISCELLANEOUS) ×1 IMPLANT
SUT SILK 2 0 (SUTURE) ×3
SUT SILK 2 0 SH (SUTURE) ×12 IMPLANT
SUT SILK 2-0 18XBRD TIE 12 (SUTURE) ×1 IMPLANT
SUT VIC AB 3-0 PS2 18 (SUTURE) ×3 IMPLANT
SUT VIC AB 3-0 SH 27 (SUTURE) ×3
SUT VIC AB 3-0 SH 27X BRD (SUTURE) IMPLANT
SYR 3ML LL SCALE MARK (SYRINGE) ×3 IMPLANT
TUBE TRACH 8.0 EXL PROX  CUF (TUBING) ×2
TUBE TRACH 8.0 EXL PROX CUF (TUBING) IMPLANT

## 2016-03-14 NOTE — Op Note (Signed)
..  03/14/2016 8:33 AM  [place],  [name] [id number]  Pre-Op Dx: Respiratory failure  Post-Op Dx:  Respiratory failure  Proc:  Tracheostomy  Surg:  Erisa Mehlman   ASSISTANT:  Anda Latina  Anes:  GOT  EBL:  <5  Comp:  none  Findings:  Severe obesity with very short neck, Size 8 Shiley XLT proximal extension placed.  Trach placed at tracheal ring 2-3.  Procedure:  The patient was brought from the intensive care unit to the operating room and transferred to an operating table.  Anesthesia was administered per indwelling orotracheal tube.   Neck extension was achieved as possible anda shoulder rolke was placed.  The lower neck was palpated with the findings as described above.  1% Xylocaine with 1:100,000 epinephrine, 9 cc's, was infiltrated into the surgical field for intraoperative hemostasis.  Several minutes were allowed for this to take effect. The patient was prepped in a sterile fashion with a surgical prep from the chin down to the upper chest.  Sterile draping was accomplished in the standard fashion.  A  5 cm horizontal incision was made sharply a finger's breadth above the sternal notch, and extended through skin and subcutaneous fat.  Using cautery, the superficial layer of the deep cervical fascia was lysed.  Additional dissection revealed the strap muscles.  The midline raphe was divided in two layers and the muscles retracted laterally.  The pretracheal plane was visualized.  This was entered bluntly.  The thyroid isthmus was isolated and divided with the Harmonic scalpel.  The thyroid gland was retracted to either side.  At this time, the 2nd tracheal ring was noted to be fractured vertically with the tracheal lumen identified.  The 2nd ring anterior wall was sharply incised and cartilage removed.  Inferior vicryl sutures were placed inferiorly on tracheal ring 3. The anterior face of the trachea was cleared.  In the  2-3rd interspace, a transverse incision was made between  cartilage rings widening the tracheostomy.  A 6 mm wide inferiorly based flap was generated and secured to the lower wound with a 3-0 vicryl suture.   A previously tested  # 8 Shiley XLT proximal extension cuffed tracheostomy tube was brought into the field.  With the endotracheal tube under direct visualization through the tracheostomy, it was gently backed up.  The tracheostomy tube was inserted into the tracheal lumen.  Hemostasis was observed. The cuff was inflated and observed to be intact and containing pressure. The inner cannula was placed and ventilation assumed per tracheostomy tube.  Good chest wall motion was observed, and CO2 was documented per anesthesia.  The trach tube was secured in the standard fashion with trach ties. A 2-0 Silk suture was used to secure the trach tube to the skin on both sides.  Hemostasis was observed again.  When satisfactory ventilation was assured, the orotracheal tube was removed.  At this point the procedure was completed.  The patient was returned to anesthesia, awakened as possible, and transferred back to the intensive care unit in stable condition.  Comment: 70 y.o. female with prolonged ventilation was the indication for today's procedure.  Anticipate a routine postoperative recovery including standard tracheal hygiene.  The sutures should be removed in 5 days.  When the patient no longer requires ventilator or pressure support, the cuff should be deflated.  Changing to an uncuffed tube and downsizing will be according to the clinical condition of the patient.   Laretha Luepke  8:33 AM 03/14/2016

## 2016-03-14 NOTE — Progress Notes (Signed)
[facility] PROGRESS NOTE Date of Admission:  02/27/2016     ID: Charlyne Petrin Maurin is a 70 y.o. female with sepsis, mold on sputum  Active Problems:   Acute respiratory failure (HCC)   History of infection not responsive to antibiotic therapy   COPD with exacerbation ([place])   Mild malnutrition ([place])  Subjective: trached today Fevers resolved LE USS Neg  ROS  Unable to obtain  Medications:  Antibiotics Given (last 72 hours)    Date/Time Action Medication Dose Rate   03/11/16 2043 Given   vancomycin (VANCOCIN) 1,250 mg in sodium chloride 0.9 % 250 mL IVPB 1,250 mg 166.7 mL/hr   03/11/16 2258 Given   meropenem (MERREM) 1 g in sodium chloride 0.9 % 100 mL IVPB 1 g 200 mL/hr   03/12/16 0656 Given   meropenem (MERREM) 1 g in sodium chloride 0.9 % 100 mL IVPB 1 g 200 mL/hr   03/12/16 0806 Given   vancomycin (VANCOCIN) 1,250 mg in sodium chloride 0.9 % 250 mL IVPB 1,250 mg 166.7 mL/hr   03/12/16 1344 Given   meropenem (MERREM) 1 g in sodium chloride 0.9 % 100 mL IVPB 1 g 200 mL/hr   03/12/16 2100 Given   vancomycin (VANCOCIN) 1,250 mg in sodium chloride 0.9 % 250 mL IVPB 1,250 mg 166.7 mL/hr   03/12/16 2149 Given   meropenem (MERREM) 1 g in sodium chloride 0.9 % 100 mL IVPB 1 g 200 mL/hr   03/13/16 0512 Given   meropenem (MERREM) 1 g in sodium chloride 0.9 % 100 mL IVPB 1 g 200 mL/hr   03/13/16 0847 Given   vancomycin (VANCOCIN) 1,250 mg in sodium chloride 0.9 % 250 mL IVPB 1,250 mg 166.7 mL/hr   03/13/16 1341 Given   meropenem (MERREM) 1 g in sodium chloride 0.9 % 100 mL IVPB 1 g 200 mL/hr   03/13/16 2012 Given   vancomycin (VANCOCIN) 1,250 mg in sodium chloride 0.9 % 250 mL IVPB 1,250 mg 166.7 mL/hr   03/13/16 2306 Given   meropenem (MERREM) 1 g in sodium chloride 0.9 % 100 mL IVPB 1 g 200 mL/hr   03/14/16 0906 Given   meropenem (MERREM) 1 g in sodium chloride 0.9 % 100 mL IVPB 1 g 200 mL/hr     . antiseptic oral rinse  7 mL Mouth Rinse 10 times per day   . budesonide (PULMICORT) nebulizer solution  0.25 mg Nebulization 4 times per day  . chlorhexidine gluconate (SAGE KIT)  15 mL Mouth Rinse BID  . famotidine (PEPCID) IV  20 mg Intravenous Q12H  . free water  200 mL Per Tube Q4H  . insulin aspart  0-20 Units Subcutaneous Q4H  . insulin aspart  7 Units Subcutaneous Q4H  . insulin glargine  30 Units Subcutaneous BID  . levothyroxine  112.5 mcg Intravenous Daily  . [START ON 03/16/2016] methylPREDNISolone (SOLU-MEDROL) injection  10 mg Intravenous Daily  . [START ON 03/15/2016] methylPREDNISolone (SOLU-MEDROL) injection  20 mg Intravenous Daily  . voriconazole  4 mg/kg (Adjusted) Intravenous Q12H    Objective: Vital signs in last 24 hours: Temp:  [97.6 F (36.4 C)-98.3 F (36.8 C)] 97.6 F (36.4 C) (05/02 1500) Pulse Rate:  [56-78] 65 (05/02 1800) Resp:  [12-20] 14 (05/02 1800) BP: (108-170)/(49-106) 149/54 mmHg (05/02 1800) SpO2:  [93 %-99 %] 94 % (05/02 1800) FiO2 (%):  [35 %-40 %] 35 % (05/02 1808) Weight:  [117.5 kg (259 lb 0.7 oz)] 117.5 kg (259 lb 0.7 oz) (  05/02 2671) Physical Exam  Constitutional:  Morbidly obese, trached , ill appearing   HENT: [place]/AT, PERRLA, no scleral icterus Mouth/Throat: clear  NeckTrach Cardiovascular: Normal rate, distant Pulmonary/Chest: Effort normal and breath sounds normal. No respiratory distress.  has no wheezes.  Neck = supple, no nuchal rigidity Abdominal: Soft. Bowel sounds are normal.  exhibits no distension. There is no tenderness.  Lymphadenopathy: no cervical adenopathy. No axillary adenopathy Neurological opens eye Skin: Skin is warm and dry. No rash noted. No erythema.  Psychiatric: intubated Access- R picc .    Lab Results  Recent Labs  03/12/16 0453  03/12/16 2112 03/13/16 0511  WBC 10.6  --   --  9.4  HGB 9.4*  --   --  10.3*  HCT 30.4*  --   --  32.7*  NA  --   < > 146* 145  K  --   < > 4.6 4.5  CL  --   < > 99* 99*  CO2  --   < > 43* 41*  BUN  --   < > 41* 45*   CREATININE  --   < > 0.62 0.54  < > = values in this interval not displayed.  Microbiology: Results for orders placed or performed during the hospital encounter of 02/27/16  MRSA PCR Screening     Status: None   Collection Time: 02/28/16  4:43 PM  Result Value Ref Range Status   MRSA by PCR NEGATIVE NEGATIVE Final    Comment:        The GeneXpert MRSA Assay (FDA approved for NASAL specimens only), is one component of a comprehensive MRSA colonization surveillance program. It is not intended to diagnose MRSA infection nor to guide or monitor treatment for MRSA infections.   Culture, blood (Routine X 2) w Reflex to ID Panel     Status: None   Collection Time: 03/02/16  2:05 PM  Result Value Ref Range Status   Specimen Description BLOOD RIGHT HAND  Final   Special Requests   Final    BOTTLES DRAWN AEROBIC AND ANAEROBIC ANA 2ML AER .5ML   Culture NO GROWTH 5 DAYS  Final   Report Status 03/07/2016 FINAL  Final  Culture, expectorated sputum-assessment     Status: None   Collection Time: 03/02/16  3:25 PM  Result Value Ref Range Status   Specimen Description TRACHEAL ASPIRATE  Final   Special Requests NONE  Final   Sputum evaluation THIS SPECIMEN IS ACCEPTABLE FOR SPUTUM CULTURE  Final   Report Status 03/02/2016 FINAL  Final  Culture, respiratory (NON-Expectorated)     Status: None (Preliminary result)   Collection Time: 03/02/16  3:25 PM  Result Value Ref Range Status   Specimen Description TRACHEAL ASPIRATE  Final   Special Requests NONE Reflexed from [id number]  Final   Gram Stain MODERATE WBC SEEN FEW HYPHAE   Final   Culture   Final    MODERATE GROWTH MOLD REFERRED TO [facility] IN [place], [place] IDENTIFICATION/CONFIRMATION    Report Status PENDING  Incomplete  Culture, blood (Routine X 2) w Reflex to ID Panel     Status: None   Collection Time: 03/02/16  3:34 PM  Result Value Ref Range Status   Specimen Description BLOOD RIGHT HAND   Final   Special Requests BOTTLES DRAWN AEROBIC AND ANAEROBIC   10CC  Final   Culture NO GROWTH 5 DAYS  Final   Report Status 03/07/2016 FINAL  Final  Urine culture     Status: Abnormal   Collection Time: 03/02/16  5:19 PM  Result Value Ref Range Status   Specimen Description URINE, RANDOM  Final   Special Requests NONE  Final   Culture >=100,000 COLONIES/mL CANDIDA KRUSEI (A)  Final   Report Status 03/05/2016 FINAL  Final  Culture, fungus without smear ([facility]-Only)     Status: None (Preliminary result)   Collection Time: 03/07/16 12:14 PM  Result Value Ref Range Status   Specimen Description BRONCHIAL WASHINGS  Final   Special Requests NONE  Final   Culture   Final    MOLD IDENTIFICATION TO FOLLOW REFERRED TO [facility] IN [place], [place] IDENTIFICATION/CONFIRMATION    Report Status PENDING  Incomplete  Culture, expectorated sputum-assessment     Status: None   Collection Time: 03/07/16 12:14 PM  Result Value Ref Range Status   Specimen Description BRONCHIAL ALVEOLAR LAVAGE  Final   Special Requests NONE  Final   Sputum evaluation THIS SPECIMEN IS ACCEPTABLE FOR SPUTUM CULTURE  Final   Report Status 03/07/2016 FINAL  Final  Culture, respiratory (NON-Expectorated)     Status: None   Collection Time: 03/07/16 12:14 PM  Result Value Ref Range Status   Specimen Description BRONCHIAL ALVEOLAR LAVAGE  Final   Special Requests NONE Reflexed from [id number]  Final   Gram Stain MODERATE WBC SEEN NO ORGANISMS SEEN   Final   Culture   Final    MODERATE GROWTH MOLD PREVIOUS SPECIMEN ALREADY SENT TO [facility] FOR IDENTITFICATION    Report Status 03/09/2016 FINAL  Final  Culture, blood (Routine X 2) w Reflex to ID Panel     Status: None   Collection Time: 03/09/16  2:55 PM  Result Value Ref Range Status   Specimen Description BLOOD RIGHT HAND  Final   Special Requests BOTTLES DRAWN AEROBIC AND ANAEROBIC  1CC  Final   Culture NO GROWTH 5 DAYS  Final    Report Status 03/14/2016 FINAL  Final  Culture, blood (Routine X 2) w Reflex to ID Panel     Status: None   Collection Time: 03/09/16  4:10 PM  Result Value Ref Range Status   Specimen Description BLOOD RIGHT HAND  Final   Special Requests BOTTLES DRAWN AEROBIC AND ANAEROBIC  1CC  Final   Culture NO GROWTH 5 DAYS  Final   Report Status 03/14/2016 FINAL  Final  Urine culture     Status: None   Collection Time: 03/11/16  6:21 AM  Result Value Ref Range Status   Specimen Description URINE, CLEAN CATCH  Final   Special Requests NONE  Final   Culture NO GROWTH 2 DAYS  Final   Report Status 03/13/2016 FINAL  Final  C difficile quick scan w PCR reflex     Status: None   Collection Time: 03/11/16  1:13 PM  Result Value Ref Range Status   C Diff antigen NEGATIVE NEGATIVE Final   C Diff toxin NEGATIVE NEGATIVE Final   C Diff interpretation Negative for C. difficile  Final    Studies/Results: Dg Abd 1 View  03/13/2016  CLINICAL DATA:  Generalized abdominal pain. EXAM: ABDOMEN - 1 VIEW COMPARISON:  March 07, 2016. FINDINGS: The bowel gas pattern is normal. No radio-opaque calculi or other significant radiographic abnormality are seen. Nasogastric tube tip is seen in proximal stomach. IMPRESSION: No definite evidence of bowel obstruction or ileus. Nasogastric tube tip seen in proximal stomach. Electronically Signed  By: Marijo Conception, M.D.   On: 03/13/2016 17:02   Dg Chest Port 1 View  03/13/2016  CLINICAL DATA:  Respiratory failure. EXAM: PORTABLE CHEST 1 VIEW COMPARISON:  03/12/2016, 03/09/2016.  CT 03/08/2016 . FINDINGS: Endotracheal tube, NG tube, right PICC line in stable position. Mediastinum and hilar structures normal. Stable cardiomegaly. Low lung volumes with mild multifocal pulmonary infiltrates again noted. Pulmonary infiltrate most prominent the right lung base. Tiny pleural effusions cannot be excluded . IMPRESSION: 1.  Lines and tubes in stable position. 2. Multifocal mild pulmonary  infiltrates, most prominent in the right lung base again noted . Tiny pleural effusions cannot be excluded. Electronically Signed   By: Marcello Moores  Register   On: 03/13/2016 07:35    Assessment/Plan: [place] Delio Aitken is a 70 y.o. female with COPD, morbid obesity, admitted with respiratory failure, initally afebrile adn with nml wbc but now with recurrent fevers, currently intubated. Has been on meropenem and vanco with bcx neg, sputum cx with mold and fungal cx with candida krusei.  She has a R fem TLC in place since 4/18. Producing lots of thick sputum CXR does not show dense infiltrate. I do not think the mold is likely the cause of her resp compromise and fevers so would continue to pursue other sources. Removed R fem TLC as possible source of infection Given candiduria has had a change of foley cath CT chest 4/26 with severe extensive peribronchovascular airspace consolidation. Trace R pleural effusion  CT abd 4/26 - no abscess or infectious source  4/25- still febrile. Wbc up to 15.  4/26 - remains febrile, no other change 4/28 fevers persist- CT chest with multifocal patchy infiltrate. Unclear why still febrile on meropenem, vanco and voriconazole. 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 Will follow with you.  [place], [place] P   03/14/2016, 7:24 PM

## 2016-03-14 NOTE — Progress Notes (Signed)
Name: ANNORAH BRENCHLEY MRN: [id number] DOB: Aug 19, 1946    ADMISSION DATE:  02/27/2016    CHIEF COMPLAINT: Acute respiratory distress and acute COPD exacerbation  HISTORY OF PRESENT ILLNESS:   Ebony [name] is a 70 year old female with medical history of HTN, DM,chronic respiratory failure due to COPD,sleep apnea, Hyperlipidemia, hypothyroidism, depression, morbid obesity who presented to [facility] on 4/16 with increased shortness of breath . She was placed on BiPAP and transitioned to HFNC, but the patient continues to complaint of increased shortness of breath, tightness, patient had a hard time to speak in full sentences. Patient was transferred to ICU for further and PCCM team was consulted for further management. Patient denies any chest pain, nausea, vomiting, dizziness , diaphoresis.  SUBJECTIVE: plan for trach this AM, remains on full vent support, remains critically ill Failure to wean from vent  VITAL SIGNS: Temp:  [98 F (36.7 C)-98.3 F (36.8 C)] 98 F (36.7 C) (05/02 0000) Pulse Rate:  [57-95] 70 (05/02 0700) Resp:  [13-23] 20 (05/02 0700) BP: (114-183)/(49-89) 152/84 mmHg (05/02 0700) SpO2:  [95 %-98 %] 98 % (05/02 0700) FiO2 (%):  [40 %] 40 % (05/02 0417) Weight:  [259 lb 0.7 oz (117.5 kg)] 259 lb 0.7 oz (117.5 kg) (05/02 0639)  PHYSICAL EXAMINATION: Constitutional: RASS -2, + F/C on WUA  HEENT: PERRLA, oral cavity with less secretions Lungs: prolonged expiration with faint distant wheezes; right>left Cardiovascular: RRR, S1/S2, no MRG  Abdominal: Obese, non-distended; distant bowel sounds Neurological:  No focal deficits Ext: symmetric BUE and BLE edema    Recent Labs Lab 03/12/16 0622 03/12/16 2112 03/13/16 0511  NA 146* 146* 145  K 5.4* 4.6 4.5  CL 102 99* 99*  CO2 40* 43* 41*  BUN 43* 41* 45*  CREATININE 0.66 0.62 0.54  GLUCOSE 261* 245* 257*    Recent Labs Lab 03/11/16 0500 03/12/16 0453 03/13/16 0511  HGB 9.9* 9.4* 10.3*  HCT 30.8* 30.4* 32.7*   WBC 9.6 10.6 9.4  PLT 298 287 306   CXR: interstitial prominence   STUDIES:  4/4/ 17 >> CT chest was Negative for acute pulmonary embolism.Mild nodularity in the lung bases, with resolution of nodularopacities observed in November and development of a new 6 mm rightlower lobe nodule. This is most likely inflammatory given the interval changes. 03/06/16 CT Brain without contrast.>>negative Chest x-ray 4/26 images reviewed continued hyperinflation consistent with emphysema, continued bibasilar atelectasis compared with previous images, and we'll change. --Bronchoscopy 4/25; candidemia throughout airways. Tracheobronchomalacia.  --Echo results 4/25; EF 65%; no vegetations noted but study was technically difficult.   CULTURES: 04/25 Blood>>NGTD Sputum>mold Urine>NGTD C-diff: negative  ANTIBIOTICS: Vancomycin 4/21> Meropenem 4/21> Voriconazole 4/24>  SIGNIFICANT EVENTS: 4/17>>AECOPD, Bronchitis, requiring continuous Bipap 4/18>>intermitted bipap, bridge with HFNC 02/28/18>> intubated.  4/25>> bronchoscopy, bronchomalacia, diffuse candidemia.   LINES/TUBES: ETT 4/18 PICC Triple lumen 04/24>   ASSESSMENT / PLAN  PULMONARY A: Acute on chronic hypercapnic respiratory failure COPD with severe persistent obstruction Prolonged VDRF OHS Multiple fluffy B nodular infiltrates c/w bronchopneumonia P:  Cont full vent support - settings reviewed and/or adjusted Cont vent bundle Daily SBT if/when meets criteria CXR daily and prn Nebulized steroids and bronchodilator - ENT consult-trach today   CARDIOVASCULAR A:  Shock, resolved Hypertension Sinus Tachycardia Hyperlipidemia P:  PRN hydralazine PRN metoprolol Monitor hemodynamics MAP goal > 65 mmHg  RENAL A:  AKI - resolved Hypervolemia Hypernatremia  Hypervolemia Mild hyperkalemia P:  Monitor BMET intermittently Monitor I/Os Correct electrolytes as indicated Diurese prn  GASTROINTESTINAL A:   Elevated LFTs  Diarrhea-c-diff negative P:  SUP: enteral famotidine Cont TFs Monitor LFTs intermittently D/C all stool softeners  HEMATOLOGIC A:  ICU acquired anemia - no active bleeding P:  DVT px: LMWH Monitor CBC intermittently Transfuse per usual guidelines  INFECTIOUS A: Persistent fever-improved Candiduria 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 P:  Monitor temp, WBC count Micro and abx as above ID service following  ENDOCRINE A:  DM 2, poorly controlled Hypothyroidism P:  Cont SSI Continue 4/30 dose of lantus  Check TSH 05/01  NEUROLOGIC A:  ICU associated discomfort Acute encephalopathy P:  RASS goal: -1, -2  Cont fentanyl infusion     I have personally reviewed/obtained a history, examined the patient, evaluated Pertinent laboratory and RadioGraphic/imaging results, and  formulated the assessment and plan   The Patient requires high complexity decision making for assessment and support, frequent evaluation and titration of therapies, application of advanced monitoring technologies and extensive interpretation of multiple databases. Critical Care Time devoted to patient care services described in this note is 35 minutes.  This Critical care time does not reflrect procedure time or supervisory time of NP but could involve care discussion time Overall, patient is critically ill, prognosis is guarded.     [name], M.D.  [facility]  Medical Director [place] Director [facility] Cardio-Pulmonary Department

## 2016-03-14 NOTE — Progress Notes (Addendum)
Pharmacy Antimicrobial Note  Ebony [name] is a 70 y.o. female admitted on 02/27/2016 with respiratory failure.  Pharmacy has been consulted for vancomycin, meropenem, and voriconazole dosing.  This is day #17 of antimicrobial therapy. Patient is currently on meropenem (day #12), vancomycin (day #12), and voriconazole (day #10). ID has been consulted and is following.   Plan: Paitent remains afebrile and cultures growing mold. After discussion with Dr. Ola Spurr, will d/c vancomycin and meropenem.   Continue current orders for voriconazole. Patient already received the two loading doses of 6 mg/kg (430 mg). Continue with current orders for 4 mg/kg (310 mg) IV q12h. Dosed using adjusted body weight since patient is obese.   Height: 5\' 3"  (160 cm) Weight: 259 lb 0.7 oz (117.5 kg) IBW/kg (Calculated) : 52.4  Temp (24hrs), Avg:98 F (36.7 C), Min:97.7 F (36.5 C), Max:98.3 F (36.8 C)   Recent Labs Lab 03/09/16 0429 03/09/16 1949 03/10/16 0521 03/11/16 0500 03/11/16 0732 03/12/16 0453 03/12/16 0622 03/12/16 2112 03/13/16 0511  WBC 14.1*  --  12.9* 9.6  --  10.6  --   --  9.4  CREATININE 0.65  --  0.57 0.65  --   --  0.66 0.62 0.54  VANCOTROUGH  --  18  --   --  17  --   --   --   --     Estimated Creatinine Clearance: 84.5 mL/min (by C-G formula based on Cr of 0.54).    Allergies  Allergen Reactions  . Other Hives and Rash    Shellfish. Shellfish Pt reports no allergic reaction to iodine or betadine.  . Shellfish Allergy Hives and Rash   Antimicrobials this admission: levofloxacin 4/16 >> 4/20 meropenem 4/21 >> 5/2 Vancomycin 4/21 >> 5/2 Voriconazole 4/24 >>  Dose adjustments this admission: 4/24 Vancomycin changed from 1500 mg IV q8h to q12h 4/26 vancomycin decreased to 1250 mg iv q 12 h due to trough of 26 mcg/ml 4/28 voriconazole increased to 310 mg q 12 h based on increased weight (4 mg/kg)  Microbiology results: 4/29 C diff: negative 4/29 UCx: NG 4/27  BCx: NG x 2 4/25 BAL: moderate growth mold 4/20 BCx: negative 4/20 UCx: Candida krusei  4/20 TA: moderate growth mold 4/17 MRSA PCR: negative  Thank you for allowing pharmacy to be a part of this patient's care.  Ulice Dash D, PharmD Clinical Pharmacist 03/14/2016 11:33 AM

## 2016-03-14 NOTE — Anesthesia Postprocedure Evaluation (Signed)
Anesthesia Post Note  Patient: Ebony [name]  Procedure(s) Performed: Procedure(s) (LRB): TRACHEOSTOMY (N/A)  Patient location during evaluation: PACU Anesthesia Type: General Level of consciousness: awake Pain management: pain level controlled Vital Signs Assessment: post-procedure vital signs reviewed and stable Respiratory status: spontaneous breathing Cardiovascular status: blood pressure returned to baseline Anesthetic complications: no    Last Vitals:  Filed Vitals:   03/14/16 0700 03/14/16 0900  BP: 152/84 153/64  Pulse: 70 67  Temp:  36.5 C  Resp: 20 14    Last Pain:  Filed Vitals:   03/14/16 0920  PainSc: 0-No pain                 VAN STAVEREN,Timeka Goette

## 2016-03-14 NOTE — Progress Notes (Signed)
Patient has rested well on the ventilator throughout the night.  Patient is following commands and nodding appropriately to asked questions only sedated on Fentanyl.  Vital signs have remained stable.  Report given to OR nurse for tracheostomy procedure this morning.  Family at bedside.  Will continue to monitor.

## 2016-03-14 NOTE — Transfer of Care (Signed)
Immediate Anesthesia Transfer of Care Note  Patient: Ebony [name]  Procedure(s) Performed: Procedure(s): TRACHEOSTOMY (N/A)  Patient Location:ICU  Anesthesia Type:General  Level of Consciousness: sedated  Airway & Oxygen Therapy: Patient placed on Ventilator (see vital sign flow sheet for setting)  Post-op Assessment: Report given to RN and Post -op Vital signs reviewed and stable  Post vital signs: Reviewed and stable  Last Vitals:  Filed Vitals:   03/14/16 0500 03/14/16 0700  BP: 150/57 152/84  Pulse: 65 70  Temp:    Resp: 17 20    Last Pain:  Filed Vitals:   03/14/16 0734  PainSc: 0-No pain         Complications: No apparent anesthesia complications

## 2016-03-14 NOTE — Anesthesia Preprocedure Evaluation (Signed)
Anesthesia Evaluation  Patient identified by MRN, date of birth, ID band Patient unresponsive    Reviewed: Allergy & Precautions, NPO status , Patient's Chart, lab work & pertinent test results  Airway Mallampati: Intubated       Dental   Pulmonary asthma , sleep apnea , COPD,  COPD inhaler and oxygen dependent, Current Smoker,     + decreased breath sounds  + intubated    Cardiovascular Exercise Tolerance: Poor hypertension, Pt. on medications  Rhythm:Irregular     Neuro/Psych Depression    GI/Hepatic negative GI ROS, Neg liver ROS,   Endo/Other  diabetes, Type 1, Insulin DependentHypothyroidism Morbid obesity  Renal/GU negative Renal ROS     Musculoskeletal   Abdominal (+) + obese,   Peds  Hematology negative hematology ROS (+)   Anesthesia Other Findings   Reproductive/Obstetrics                             Anesthesia Physical Anesthesia Plan  ASA: IV  Anesthesia Plan: General   Post-op Pain Management:    Induction: Intravenous  Airway Management Planned: Oral ETT  Additional Equipment:   Intra-op Plan:   Post-operative Plan:   Informed Consent: I have reviewed the patients History and Physical, chart, labs and discussed the procedure including the risks, benefits and alternatives for the proposed anesthesia with the patient or authorized representative who has indicated his/her understanding and acceptance.     Plan Discussed with: CRNA  Anesthesia Plan Comments:         Anesthesia Quick Evaluation

## 2016-03-14 NOTE — Progress Notes (Signed)
Inpatient Diabetes Program Recommendations  AACE/ADA: New Consensus Statement on Inpatient Glycemic Control (2015)  Target Ranges:  Prepandial:   less than 140 mg/dL      Peak postprandial:   less than 180 mg/dL (1-2 hours)      Critically ill patients:  140 - 180 mg/dL   Review of Glycemic ControlResults for Ebony [name], Ebony [name] (MRN [id number]) as of 03/14/2016 14:50  Ref. Range 03/13/2016 20:01 03/14/2016 00:03 03/14/2016 04:06 03/14/2016 11:24 03/14/2016 12:40  Glucose-Capillary Latest Ref Range: 65-99 mg/dL 172 (H) 167 (H) 94 65 82   Note that tube feeds held this morning for procedure.  Called and spoke with RN and she states that feeds have not been restarted at this time.  May consider adding dextrose to IV fluids while tube feeds are off to prevent low blood sugar.  Discussed with RN and told her that she may need to call MD to request.  Thanks, Adah Perl, RN, BC-ADM Inpatient Diabetes Coordinator Pager [phone] (8a-5p)   I

## 2016-03-15 ENCOUNTER — Encounter
Admission: EM | Disposition: A | Discharge status: Other Acute Inpatient Hospital | Payer: Self-pay | Source: Home / Self Care | Attending: Internal Medicine

## 2016-03-15 DIAGNOSIS — B49 Unspecified mycosis: Secondary | ICD-10-CM

## 2016-03-15 DIAGNOSIS — J17 Pneumonia in diseases classified elsewhere: Secondary | ICD-10-CM

## 2016-03-15 DIAGNOSIS — R1310 Dysphagia, unspecified: Secondary | ICD-10-CM | POA: Insufficient documentation

## 2016-03-15 LAB — GLUCOSE, CAPILLARY
GLUCOSE-CAPILLARY: 125 mg/dL — AB (ref 65–99)
Glucose-Capillary: 105 mg/dL — ABNORMAL HIGH (ref 65–99)
Glucose-Capillary: 118 mg/dL — ABNORMAL HIGH (ref 65–99)
Glucose-Capillary: 139 mg/dL — ABNORMAL HIGH (ref 65–99)
Glucose-Capillary: 157 mg/dL — ABNORMAL HIGH (ref 65–99)
Glucose-Capillary: 182 mg/dL — ABNORMAL HIGH (ref 65–99)
Glucose-Capillary: 188 mg/dL — ABNORMAL HIGH (ref 65–99)

## 2016-03-15 SURGERY — INSERTION, PEG TUBE
Anesthesia: General

## 2016-03-15 MED ORDER — PROPOFOL 1000 MG/100ML IV EMUL
INTRAVENOUS | Status: AC
  Administered 2016-03-15: 20 ug/kg/min via INTRAVENOUS
  Filled 2016-03-15: qty 100

## 2016-03-15 MED ORDER — SODIUM CHLORIDE 0.9 % IJ SOLN
INTRAMUSCULAR | Status: AC
  Filled 2016-03-15: qty 10

## 2016-03-15 MED ORDER — MEROPENEM 1 G IV SOLR
1.0000 g | Freq: Once | INTRAVENOUS | Status: AC
  Administered 2016-03-15: 1 g via INTRAVENOUS
  Filled 2016-03-15: qty 1

## 2016-03-15 MED ORDER — PROPOFOL 1000 MG/100ML IV EMUL
5.0000 ug/kg/min | Freq: Once | INTRAVENOUS | Status: AC
  Administered 2016-03-15: 20 ug/kg/min via INTRAVENOUS

## 2016-03-15 NOTE — Progress Notes (Signed)
Pharmacy Antimicrobial Note  Ebony [name] is a 70 y.o. female admitted on 02/27/2016 with respiratory failure.  Pharmacy has been consulted for voriconazole dosing. Patient previously on vancomycin and meropenem.   This is day #18 of antimicrobial therapy. Patient is currently on voriconazole (day #11). ID has been consulted and is following.   Plan: Continue current orders for voriconazole. Patient already received the two loading doses of 6 mg/kg (430 mg). Continue with current orders for 4 mg/kg (310 mg) IV q12h. Dosed using adjusted body weight since patient is obese. If voriconazole IV is to continue, need to check a level. Will f/u with lab to ascertain ability to order level and may discuss with ID converting to oral therapy.    Height: 5\' 3"  (160 cm) Weight: 250 lb 10.6 oz (113.7 kg) IBW/kg (Calculated) : 52.4  Temp (24hrs), Avg:97.8 F (36.6 C), Min:97.5 F (36.4 C), Max:98.3 F (36.8 C)   Recent Labs Lab 03/09/16 0429 03/09/16 1949 03/10/16 0521 03/11/16 0500 03/11/16 0732 03/12/16 0453 03/12/16 0622 03/12/16 2112 03/13/16 0511  WBC 14.1*  --  12.9* 9.6  --  10.6  --   --  9.4  CREATININE 0.65  --  0.57 0.65  --   --  0.66 0.62 0.54  VANCOTROUGH  --  18  --   --  17  --   --   --   --     Estimated Creatinine Clearance: 82.8 mL/min (by C-G formula based on Cr of 0.54).    Allergies  Allergen Reactions  . Other Hives and Rash    Shellfish. Shellfish Pt reports no allergic reaction to iodine or betadine.  . Shellfish Allergy Hives and Rash   Antimicrobials this admission: levofloxacin 4/16 >> 4/20 meropenem 4/21 >> 5/2 Vancomycin 4/21 >> 5/2 Voriconazole 4/24 >>  Dose adjustments this admission: 4/24 Vancomycin changed from 1500 mg IV q8h to q12h 4/26 vancomycin decreased to 1250 mg iv q 12 h due to trough of 26 mcg/ml 4/28 voriconazole increased to 310 mg q 12 h based on increased weight (4 mg/kg)  Microbiology results: 4/29 C diff: negative 4/29  UCx: NG 4/27 BCx: NG x 2 4/25 BAL: moderate growth mold 4/20 BCx: negative 4/20 UCx: Candida krusei  4/20 TA: moderate growth mold 4/17 MRSA PCR: negative  Thank you for allowing pharmacy to be a part of this patient's care.  Ulice Dash D, PharmD Clinical Pharmacist 03/15/2016 11:30 AM

## 2016-03-15 NOTE — Progress Notes (Signed)
Name: Ebony [name] MRN: [id number] DOB: 1946/08/15    ADMISSION DATE:  02/27/2016    CHIEF COMPLAINT: Acute respiratory distress and acute COPD exacerbation  HISTORY OF PRESENT ILLNESS:   Ebony [name] is a 70 year old female with medical history of HTN, DM,chronic respiratory failure due to COPD,sleep apnea, Hyperlipidemia, hypothyroidism, depression, morbid obesity who presented to [facility] on 4/16 with increased shortness of breath . She was placed on BiPAP and transitioned to HFNC, but the patient continues to complaint of increased shortness of breath, tightness, patient had a hard time to speak in full sentences. Patient was transferred to ICU for further and PCCM team was consulted for further management. Patient denies any chest pain, nausea, vomiting, dizziness , diaphoresis.  SUBJECTIVE: s/p trach #8 XLT  remains on full vent support, remains critically ill Failure to wean from vent, plan for PEG tube today  VITAL SIGNS: Temp:  [97.6 F (36.4 C)-98.3 F (36.8 C)] 98.3 F (36.8 C) (05/03 0400) Pulse Rate:  [56-111] 79 (05/03 0600) Resp:  [12-23] 21 (05/03 0600) BP: (108-182)/(48-106) 176/64 mmHg (05/03 0600) SpO2:  [92 %-97 %] 95 % (05/03 0752) FiO2 (%):  [35 %-40 %] 35 % (05/03 0752) Weight:  [250 lb 10.6 oz (113.7 kg)-259 lb 7.7 oz (117.7 kg)] 250 lb 10.6 oz (113.7 kg) (05/03 [id number])  PHYSICAL EXAMINATION: Constitutional: RASS -2, + F/C on WUA  HEENT: PERRLA, oral cavity with less secretions s/p trach Lungs:  distant wheezes; right>left Cardiovascular: RRR, S1/S2, no MRG  Abdominal: Obese, non-distended; distant bowel sounds Neurological:  No focal deficits Ext: symmetric BUE and BLE edema    Recent Labs Lab 03/12/16 0622 03/12/16 2112 03/13/16 0511  NA 146* 146* 145  K 5.4* 4.6 4.5  CL 102 99* 99*  CO2 40* 43* 41*  BUN 43* 41* 45*  CREATININE 0.66 0.62 0.54  GLUCOSE 261* 245* 257*    Recent Labs Lab 03/11/16 0500 03/12/16 0453 03/13/16 0511  HGB  9.9* 9.4* 10.3*  HCT 30.8* 30.4* 32.7*  WBC 9.6 10.6 9.4  PLT 298 287 306   CXR: interstitial prominence   STUDIES:  4/4/ 17 >> CT chest was Negative for acute pulmonary embolism.Mild nodularity in the lung bases, with resolution of nodularopacities observed in November and development of a new 6 mm rightlower lobe nodule. This is most likely inflammatory given the interval changes. 03/06/16 CT Brain without contrast.>>negative Chest x-ray 4/26 images reviewed continued hyperinflation consistent with emphysema, continued bibasilar atelectasis compared with previous images, and we'll change. --Bronchoscopy 4/25; candidemia throughout airways. Tracheobronchomalacia.  --Echo results 4/25; EF 65%; no vegetations noted but study was technically difficult.   CULTURES: 04/25 Blood>>NGTD Sputum>mold Urine>NGTD C-diff: negative  ANTIBIOTICS: Vancomycin 4/21> Meropenem 4/21> Voriconazole 4/24>  SIGNIFICANT EVENTS: 4/17>>AECOPD, Bronchitis, requiring continuous Bipap 4/18>>intermitted bipap, bridge with HFNC 02/28/18>> intubated.  4/25>> bronchoscopy, bronchomalacia, diffuse candidemia.   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 Multiple fluffy B nodular infiltrates c/w bronchopneumonia P:  Cont full vent support - settings reviewed and/or adjusted Cont vent bundle Daily SBT if/when meets criteria CXR daily and prn Nebulized steroids and bronchodilator S/p trach   CARDIOVASCULAR A:  Shock, resolved Hypertension Sinus Tachycardia  Hyperlipidemia P:  PRN hydralazine PRN metoprolol Monitor hemodynamics MAP goal > 65  mmHg  RENAL A:  AKI - resolved P:  Monitor BMET intermittently Monitor I/Os Correct electrolytes as indicated Diurese prn   GASTROINTESTINAL A:  Elevated LFTs  Diarrhea-c-diff negative P:  SUP: enteral famotidine Cont TFs Monitor LFTs intermittently D/C all stool softeners -PEG tube today  HEMATOLOGIC A:  ICU acquired anemia - no active bleeding P:  DVT px: LMWH Monitor CBC intermittently Transfuse per usual guidelines  INFECTIOUS A: Persistent fever-improved Candiduria 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 ID service following  ENDOCRINE A:  DM 2, poorly controlled Hypothyroidism P:  Cont SSI    NEUROLOGIC A:  ICU associated discomfort Acute encephalopathy P:  RASS goal: -1, -2  Cont fentanyl infusion     I have personally reviewed/obtained a history, examined the patient, evaluated Pertinent laboratory and RadioGraphic/imaging results, and  formulated the assessment and plan   The Patient requires high complexity decision making for assessment and support, frequent evaluation and titration of therapies, application of advanced monitoring technologies and extensive interpretation of multiple databases. Critical Care Time devoted to patient care services described in this note is 35 minutes.   Overall, patient is critically ill, prognosis is guarded.     [name], M.D.  [facility]  Medical Director [place] Director [facility] Cardio-Pulmonary Department

## 2016-03-15 NOTE — Op Note (Signed)
[name] Gastroenterology Patient Name: Ebony [name] Procedure Date: 03/15/2016 3:27 PM MRN: [id number] Account #: [id number] Date of Birth: 24-Sep-1946 Admit Type: Inpatient Age: 70 Room: [facility] ENDO ROOM 4 Gender: Female Note Status: Finalized Procedure:            Upper GI endoscopy Indications:          Dysphagia Providers:            Lucilla Lame, MD Medicines:            Propofol bolus dose 20 mg IV Complications:        No immediate complications. Procedure:            Pre-Anesthesia Assessment:                       - Prior to the procedure, a History and Physical was                        performed, and patient medications and allergies were                        reviewed. The patient's tolerance of previous                        anesthesia was also reviewed. The risks and benefits of                        the procedure and the sedation options and risks were                        discussed with the patient. All questions were                        answered, and informed consent was obtained. Prior                        Anticoagulants: The patient has taken no previous                        anticoagulant or antiplatelet agents. ASA Grade                        Assessment: IV - A patient with severe systemic disease                        that is a constant threat to life. After reviewing the                        risks and benefits, the patient was deemed in                        satisfactory condition to undergo the procedure.                       After obtaining informed consent, the endoscope was                        passed under direct vision. Throughout the procedure,  the patient's blood pressure, pulse, and oxygen                        saturations were monitored continuously. The                        Colonoscope was introduced through the mouth, and                        advanced to the second part of  duodenum. The upper GI                        endoscopy was accomplished without difficulty. The                        patient tolerated the procedure well. Findings:      The examined esophagus was normal.      The stomach was normal.      The examined duodenum was normal.      Placement of an externally removable PEG with no T-fasteners was       successfully completed. The external bumper was at the 3.0 cm marking on       the tube. Impression:           - Normal esophagus.                       - Normal stomach.                       - Normal examined duodenum.                       - An externally removable PEG placement was                        successfully completed.                       - No specimens collected. Recommendation:       - Please follow the post-PEG recommendations including:                        change dressing once per day and NPO x4 hrs then water                        today. Procedure Code(s):    --- Professional ---                       [phone], Esophagogastroduodenoscopy, flexible, transoral;                        with directed placement of percutaneous gastrostomy tube Diagnosis Code(s):    --- Professional ---                       R13.10, Dysphagia, unspecified CPT copyright 2016 American Medical Association. All rights reserved. The codes documented in this report are preliminary and upon coder review may  be revised to meet current compliance requirements. Lucilla Lame, MD 03/15/2016 3:57:42 PM This report has been signed electronically. Number of Addenda: 0 Note Initiated On: 03/15/2016 3:27 PM      [place]  [facility]

## 2016-03-15 NOTE — Progress Notes (Signed)
1600 Peg placed per Dr. Allen Norris. Sedated with Propofol. Tolerated procedure well. Given Versed 1mg  x 1 for anxiety. Trach site remains oozy but assessed per ENT physician Dr. Jeannie Fend- no new orders.Otherwise uneventful day. Remains on vent.&,Fentanyl.

## 2016-03-15 NOTE — Progress Notes (Signed)
Patient has rested well throughout the night, alert, nodding and mouthing words appropriately.  Trach dressing has been reinforced, small amount of old bloody drainage on sponges.  Patient has been tolerating ventilator and trach well with a small amount of secretions relieved by suctioning.  Blood pressure has remained slightly elevated ([id number] systolic at times) requiring PRN hydralazine.  Urine output has been adequate. See flowsheet for more detailed assessment.  Will continue to monitor.

## 2016-03-15 NOTE — Progress Notes (Signed)
..   03/15/2016 5:04 PM  [name], [name] [id number]  Post-Op Day 1    Temp:  [97.5 F (36.4 C)-98.3 F (36.8 C)] 98.3 F (36.8 C) (05/03 1200) Pulse Rate:  [64-111] 101 (05/03 1400) Resp:  [13-25] 18 (05/03 1400) BP: (120-182)/(48-83) 166/68 mmHg (05/03 1400) SpO2:  [92 %-96 %] 96 % (05/03 1400) FiO2 (%):  [35 %] 35 % (05/03 1345) Weight:  [113.7 kg (250 lb 10.6 oz)-117.7 kg (259 lb 7.7 oz)] 113.7 kg (250 lb 10.6 oz) (05/03 0619),     Intake/Output Summary (Last 24 hours) at 03/15/16 1704 Last data filed at 03/15/16 1418  Gross per 24 hour  Intake    581 ml  Output   2050 ml  Net  -1469 ml    Results for orders placed or performed during the hospital encounter of 02/27/16 (from the past 24 hour(s))  Glucose, capillary     Status: Abnormal   Collection Time: 03/14/16  8:10 PM  Result Value Ref Range   Glucose-Capillary 129 (H) 65 - 99 mg/dL  Glucose, capillary     Status: Abnormal   Collection Time: 03/15/16 12:09 AM  Result Value Ref Range   Glucose-Capillary 125 (H) 65 - 99 mg/dL  Glucose, capillary     Status: Abnormal   Collection Time: 03/15/16  4:23 AM  Result Value Ref Range   Glucose-Capillary 105 (H) 65 - 99 mg/dL  Glucose, capillary     Status: Abnormal   Collection Time: 03/15/16  7:41 AM  Result Value Ref Range   Glucose-Capillary 118 (H) 65 - 99 mg/dL  Glucose, capillary     Status: Abnormal   Collection Time: 03/15/16 11:25 AM  Result Value Ref Range   Glucose-Capillary 139 (H) 65 - 99 mg/dL  Glucose, capillary     Status: Abnormal   Collection Time: 03/15/16  4:26 PM  Result Value Ref Range   Glucose-Capillary 157 (H) 65 - 99 mg/dL    SUBJECTIVE:  PEG today,  Dressing changed,  Some oozing around site.  Patient moving and turning head quite a bit.  OBJECTIVE:  GEN- NAD, opens eyes to name and nods head NECK- trach secure and patent.  Mild edema surrounding and dried blood on dressing  IMPRESSION:  S/p trach pod#1  PLAN:  OK to remove sutures  on POD #5 (Sunday).  Continue routine dressing changes.  Given amount of movement patient is doing, expect some oozing from trach for several days.  Please reconsult if any concerns.  [name] 03/15/2016, 5:04 PM

## 2016-03-15 NOTE — Care Management (Signed)
spoke with patient's husband about ltach and he would like to meet with representative face to face from [facility] to discuss this as a care option.  Patient did have trach yesterday and should have Peg tube today.  Notified [facility] representatives.

## 2016-03-16 LAB — GLUCOSE, CAPILLARY
GLUCOSE-CAPILLARY: 123 mg/dL — AB (ref 65–99)
GLUCOSE-CAPILLARY: 214 mg/dL — AB (ref 65–99)
GLUCOSE-CAPILLARY: 217 mg/dL — AB (ref 65–99)
Glucose-Capillary: 161 mg/dL — ABNORMAL HIGH (ref 65–99)
Glucose-Capillary: 81 mg/dL (ref 65–99)
Glucose-Capillary: 180 mg/dL — ABNORMAL HIGH (ref 65–99)

## 2016-03-16 MED ORDER — METHYLPREDNISOLONE SODIUM SUCC 40 MG IJ SOLR
10.0000 mg | Freq: Two times a day (BID) | INTRAMUSCULAR | Status: DC
  Administered 2016-03-16 – 2016-03-19 (×7): 10 mg via INTRAVENOUS
  Filled 2016-03-16 (×7): qty 1

## 2016-03-16 MED ORDER — IPRATROPIUM-ALBUTEROL 0.5-2.5 (3) MG/3ML IN SOLN
3.0000 mL | RESPIRATORY_TRACT | Status: DC
  Administered 2016-03-16 – 2016-03-22 (×37): 3 mL via RESPIRATORY_TRACT
  Filled 2016-03-16 (×38): qty 3

## 2016-03-16 MED ORDER — BUDESONIDE 0.5 MG/2ML IN SUSP
0.5000 mg | Freq: Two times a day (BID) | RESPIRATORY_TRACT | Status: DC
  Administered 2016-03-16 – 2016-03-21 (×10): 0.5 mg via RESPIRATORY_TRACT
  Filled 2016-03-16 (×10): qty 2

## 2016-03-16 NOTE — Progress Notes (Signed)
Dr. Mortimer Fries notified of Run of SVT. No orders received.

## 2016-03-16 NOTE — Progress Notes (Signed)
Patient tolerated vent on Fentanyl throughout the night.  Lurline Idol has maintained minimal oozing throughout the night.  Tube feedings started back at 2200 last night, patient tolerating well and advancing towards goal rate.  Blood pressure has been stable, only requiring a PRN dose of Hydralazine once.  See flowsheet for detailed assessment.  Will continue to monitor.

## 2016-03-16 NOTE — Progress Notes (Signed)
Name: SHAMECKA DESCHLER MRN: [id number] DOB: 08-Jul-1946    ADMISSION DATE:  02/27/2016    CHIEF COMPLAINT: Acute respiratory distress and acute COPD exacerbation  HISTORY OF PRESENT ILLNESS:   Legere Chasitee is a 70 year old female with medical history of HTN, DM,chronic respiratory failure due to COPD,sleep apnea, Hyperlipidemia, hypothyroidism, depression, morbid obesity who presented to [facility] on 4/16 with increased shortness of breath . She was placed on BiPAP and transitioned to HFNC, but the patient continues to complaint of increased shortness of breath, tightness, patient had a hard time to speak in full sentences. Patient was transferred to ICU for further and PCCM team was consulted for further management. Patient denies any chest pain, nausea, vomiting, dizziness , diaphoresis.  SUBJECTIVE: s/p trach #8 XLT  remains on full vent support, remains critically ill Failure to wean from vent,s/p PEG tube today Wheezing this AM, increased WOB on vent  VITAL SIGNS: Temp:  [97.5 F (36.4 C)-98.3 F (36.8 C)] 97.6 F (36.4 C) (05/04 0400) Pulse Rate:  [68-107] 68 (05/04 0700) Resp:  [9-25] 9 (05/04 0700) BP: (121-208)/(48-81) 121/48 mmHg (05/04 0700) SpO2:  [94 %-99 %] 95 % (05/04 0729) FiO2 (%):  [35 %] 35 % (05/04 0729) Weight:  [258 lb 6.1 oz (117.2 kg)] 258 lb 6.1 oz (117.2 kg) (05/04 0500)  PHYSICAL EXAMINATION: Constitutional: RASS -2, + F/C on WUA  HEENT: PERRLA, oral cavity with less secretions s/p trach Lungs:  distant wheezes; right>left Cardiovascular: RRR, S1/S2, no MRG  Abdominal: Obese, non-distended; distant bowel sounds Neurological:  No focal deficits Ext: symmetric BUE and BLE edema    Recent Labs Lab 03/12/16 0622 03/12/16 2112 03/13/16 0511  NA 146* 146* 145  K 5.4* 4.6 4.5  CL 102 99* 99*  CO2 40* 43* 41*  BUN 43* 41* 45*  CREATININE 0.66 0.62 0.54  GLUCOSE 261* 245* 257*    Recent Labs Lab 03/11/16 0500 03/12/16 0453 03/13/16 0511  HGB  9.9* 9.4* 10.3*  HCT 30.8* 30.4* 32.7*  WBC 9.6 10.6 9.4  PLT 298 287 306   CXR: interstitial prominence   STUDIES:  4/4/ 17 >> CT chest was Negative for acute pulmonary embolism.Mild nodularity in the lung bases, with resolution of nodularopacities observed in November and development of a new 6 mm rightlower lobe nodule. This is most likely inflammatory given the interval changes. 03/06/16 CT Brain without contrast.>>negative Chest x-ray 4/26 images reviewed continued hyperinflation consistent with emphysema, continued bibasilar atelectasis compared with previous images, and we'll change. --Bronchoscopy 4/25; candidemia throughout airways. Tracheobronchomalacia.  --Echo results 4/25; EF 65%; no vegetations noted but study was technically difficult.   CULTURES: 04/25 Blood>>NGTD Sputum>mold Urine>NGTD C-diff: negative  ANTIBIOTICS: Vancomycin 4/21> Meropenem 4/21> Voriconazole 4/24>  SIGNIFICANT EVENTS: 4/17>>AECOPD, Bronchitis, requiring continuous Bipap 4/18>>intermitted bipap, bridge with HFNC 02/28/18>> intubated.  4/25>> bronchoscopy, bronchomalacia, diffuse candidemia.   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 Multiple fluffy B nodular infiltrates c/w bronchopneumonia P:  Cont full vent support - settings reviewed and/or adjusted Cont vent bundle Wean to PS mode when able-not today-patient with increased WOB and wheezing CXR daily and prn Nebulized steroids and bronchodilator S/p trach   CARDIOVASCULAR A:  Shock, resolved Hypertension Sinus Tachycardia Hyperlipidemia P:  PRN hydralazine PRN metoprolol Monitor  hemodynamics MAP goal > 65 mmHg  RENAL A:  AKI - resolved P:  Monitor BMET intermittently Monitor I/Os Correct electrolytes as indicated Diurese prn   GASTROINTESTINAL A:  Elevated LFTs  Diarrhea-c-diff negative P:  SUP: enteral famotidine Cont TFs Monitor LFTs intermittently D/C all stool softeners S/p PEG tube  HEMATOLOGIC A:  ICU acquired anemia - no active bleeding P:  DVT px: LMWH Monitor CBC intermittently Transfuse per usual guidelines  INFECTIOUS A: Persistent fever-improved Candiduria 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 ID service following  ENDOCRINE A:  DM 2, poorly controlled Hypothyroidism P:  Cont SSI    NEUROLOGIC A:  ICU associated discomfort Acute encephalopathy P:  RASS goal: -1, -2  Cont fentanyl infusion     I have personally reviewed/obtained a history, examined the patient, evaluated Pertinent laboratory and RadioGraphic/imaging results, and  formulated the assessment and plan   The Patient requires high complexity decision making for assessment and support, frequent evaluation and titration of therapies, application of advanced monitoring technologies and extensive interpretation of multiple databases. Critical Care Time devoted to patient care services described in this note is 35 minutes.   Overall, patient is critically ill, prognosis is guarded.     [name], M.D.  [facility]  Medical Director [place] Director [facility] Cardio-Pulmonary Department

## 2016-03-16 NOTE — Care Management (Signed)
[facility] in to speak with son.

## 2016-03-16 NOTE — Progress Notes (Signed)
1830 family in and out. Good day .Family  discussed L-TAC in positive light  Today. Tolerating Tube  Afebrile. feeding and trach well.

## 2016-03-16 NOTE — Progress Notes (Signed)
Nutrition Follow-up  DOCUMENTATION CODES:   Morbid obesity  INTERVENTION:  -Recommend continuing to titrate to goal rate of 60 ml/hr as tolerated; continue to assess  NUTRITION DIAGNOSIS:   Inadequate oral intake related to acute illness as evidenced by NPO status.  Being addressed via PEG, TF  GOAL:   Provide needs based on ASPEN/SCCM guidelines  MONITOR:   Vent status, TF tolerance, Labs, Weight trends, I & O's  REASON FOR ASSESSMENT:   Consult, Ventilator Enteral/tube feeding initiation and management  ASSESSMENT:    70 yo female admitted with acute respiratory failure duw to acute on chronic COPD exacerbation with associated acute bronchitis, initially on Bipap, required intubation on 02/29/16  Pt remains on vent via trach, s/p PEG yesterday, normal EGD, plan for discharge to LTAC when able.   Tolerating Vital High Protein at rate of 50 ml/hr via PEG  Diet Order:  Diet NPO time specified   Skin:  Reviewed, no issues  Last BM:  03/13/16   LABS: no BMP today  Glucose Profile:  Recent Labs  03/15/16 2339 03/16/16 0412 03/16/16 0722  GLUCAP 188* 161* 123*   Meds: ss novolog, lantus, solumedrol  Height:   Ht Readings from Last 1 Encounters:  03/08/16 5\' 3"  (1.6 m)    Weight:   Wt Readings from Last 1 Encounters:  03/16/16 258 lb 6.1 oz (117.2 kg)    Ideal Body Weight:  52.3 kg  BMI:  Body mass index is 45.78 kg/(m^2).  Estimated Nutritional Needs:   Kcal:  [id number] kcals (11-14 kcals/kg current wt of 102.5 kg for BMI >30 using ASPEN guidelines)  Protein:  >/= 104 g (2.0 g/kg) IBW  Fluid:  >/= 1.5 L  EDUCATION NEEDS:   Education needs no appropriate at this time  [place], [place], [place] [phone] Pager  [phone] Weekend/On-Call Pager

## 2016-03-16 NOTE — Progress Notes (Signed)
Pharmacy Antimicrobial Note  Ebony [name] is a 70 y.o. female admitted on 02/27/2016 with respiratory failure.  Pharmacy has been consulted for voriconazole dosing. Patient previously on vancomycin and meropenem.   Patient is currently on voriconazole (day #12). ID has been consulted and is following.   Plan: Continue current orders for voriconazole. Patient already received the two loading doses of 6 mg/kg (430 mg). Continue with current orders for 4 mg/kg (310 mg) IV q12h. Dosed using adjusted body weight since patient is obese. Patient now s/p PEG so may be a candidate for oral voriconazole although would have to pause TF for administration. Will get dietician's input as to when it may be possible to transition to bolus feeds. If continuing with IV, may need to consider checking a level although this is done off-site.     Height: 5\' 3"  (160 cm) Weight: 258 lb 6.1 oz (117.2 kg) IBW/kg (Calculated) : 52.4  Temp (24hrs), Avg:98.2 F (36.8 C), Min:97.6 F (36.4 C), Max:98.8 F (37.1 C)   Recent Labs Lab 03/09/16 1949 03/10/16 0521 03/11/16 0500 03/11/16 0732 03/12/16 0453 03/12/16 0622 03/12/16 2112 03/13/16 0511  WBC  --  12.9* 9.6  --  10.6  --   --  9.4  CREATININE  --  0.57 0.65  --   --  0.66 0.62 0.54  VANCOTROUGH 18  --   --  17  --   --   --   --     Estimated Creatinine Clearance: 84.3 mL/min (by C-G formula based on Cr of 0.54).    Allergies  Allergen Reactions  . Other Hives and Rash    Shellfish. Shellfish Pt reports no allergic reaction to iodine or betadine.  . Shellfish Allergy Hives and Rash   Antimicrobials this admission: levofloxacin 4/16 >> 4/20 meropenem 4/21 >> 5/2 Vancomycin 4/21 >> 5/2 Voriconazole 4/24 >>  Dose adjustments this admission: 4/24 Vancomycin changed from 1500 mg IV q8h to q12h 4/26 vancomycin decreased to 1250 mg iv q 12 h due to trough of 26 mcg/ml 4/28 voriconazole increased to 310 mg q 12 h based on increased weight (4  mg/kg)  Microbiology results: 4/29 C diff: negative 4/29 UCx: NG 4/27 BCx: NG x 2 4/25 BAL: moderate growth mold 4/20 BCx: negative 4/20 UCx: Candida krusei  4/20 TA: moderate growth mold 4/17 MRSA PCR: negative  Thank you for allowing pharmacy to be a part of this patient's care.  Ulice Dash D, PharmD Clinical Pharmacist 03/16/2016 11:28 AM

## 2016-03-17 DIAGNOSIS — R1310 Dysphagia, unspecified: Secondary | ICD-10-CM

## 2016-03-17 LAB — BASIC METABOLIC PANEL
Anion gap: 7 (ref 5–15)
BUN: 30 mg/dL — AB (ref 6–20)
CHLORIDE: 96 mmol/L — AB (ref 101–111)
CO2: 39 mmol/L — AB (ref 22–32)
CREATININE: 0.47 mg/dL (ref 0.44–1.00)
Calcium: 9.4 mg/dL (ref 8.9–10.3)
GFR calc Af Amer: >60 mL/min (ref >60)
GFR calc non Af Amer: >60 mL/min (ref >60)
GLUCOSE: 111 mg/dL — AB (ref 65–99)
Potassium: 4.3 mmol/L (ref 3.5–5.1)
Sodium: 142 mmol/L (ref 135–145)

## 2016-03-17 LAB — GLUCOSE, CAPILLARY
GLUCOSE-CAPILLARY: 134 mg/dL — AB (ref 65–99)
GLUCOSE-CAPILLARY: 177 mg/dL — AB (ref 65–99)
Glucose-Capillary: 153 mg/dL — ABNORMAL HIGH (ref 65–99)
Glucose-Capillary: 127 mg/dL — ABNORMAL HIGH (ref 65–99)
Glucose-Capillary: 94 mg/dL (ref 65–99)
Glucose-Capillary: 164 mg/dL — ABNORMAL HIGH (ref 65–99)
Glucose-Capillary: 111 mg/dL — ABNORMAL HIGH (ref 65–99)

## 2016-03-17 LAB — PHOSPHORUS: PHOSPHORUS: 3.2 mg/dL (ref 2.5–4.6)

## 2016-03-17 LAB — MAGNESIUM: Magnesium: 2.1 mg/dL (ref 1.7–2.4)

## 2016-03-17 MED ORDER — FENTANYL CITRATE (PF) 100 MCG/2ML IJ SOLN
25.0000 ug | INTRAMUSCULAR | Status: DC | PRN
  Administered 2016-03-17: 25 ug via INTRAVENOUS
  Administered 2016-03-17 – 2016-03-22 (×11): 50 ug via INTRAVENOUS
  Filled 2016-03-17 (×12): qty 2

## 2016-03-17 MED ORDER — ENOXAPARIN SODIUM 40 MG/0.4ML ~~LOC~~ SOLN
40.0000 mg | Freq: Two times a day (BID) | SUBCUTANEOUS | Status: DC
  Administered 2016-03-17 – 2016-03-22 (×11): 40 mg via SUBCUTANEOUS
  Filled 2016-03-17 (×11): qty 0.4

## 2016-03-17 MED ORDER — DOCUSATE SODIUM 50 MG/5ML PO LIQD
100.0000 mg | Freq: Two times a day (BID) | ORAL | Status: DC
  Administered 2016-03-17 – 2016-03-19 (×6): 100 mg via ORAL
  Filled 2016-03-17 (×6): qty 10

## 2016-03-17 MED ORDER — LOSARTAN POTASSIUM 25 MG PO TABS
25.0000 mg | ORAL_TABLET | Freq: Every day | ORAL | Status: DC
  Administered 2016-03-17 – 2016-03-22 (×6): 25 mg
  Filled 2016-03-17 (×6): qty 1

## 2016-03-17 MED ORDER — FUROSEMIDE 10 MG/ML IJ SOLN
60.0000 mg | Freq: Once | INTRAMUSCULAR | Status: AC
  Administered 2016-03-17: 60 mg via INTRAVENOUS
  Filled 2016-03-17: qty 6

## 2016-03-17 MED ORDER — LETROZOLE 2.5 MG PO TABS
2.5000 mg | ORAL_TABLET | Freq: Every day | ORAL | Status: DC
  Administered 2016-03-17 – 2016-03-20 (×4): 2.5 mg via ORAL
  Filled 2016-03-17 (×4): qty 1

## 2016-03-17 MED ORDER — SENNOSIDES 8.8 MG/5ML PO SYRP
5.0000 mL | ORAL_SOLUTION | Freq: Two times a day (BID) | ORAL | Status: DC
  Administered 2016-03-17 – 2016-03-19 (×6): 5 mL via ORAL
  Filled 2016-03-17 (×6): qty 5

## 2016-03-17 MED ORDER — INSULIN ASPART 100 UNIT/ML ~~LOC~~ SOLN
8.0000 [IU] | SUBCUTANEOUS | Status: DC
  Administered 2016-03-17 – 2016-03-19 (×9): 8 [IU] via SUBCUTANEOUS
  Filled 2016-03-17 (×10): qty 8

## 2016-03-17 MED ORDER — CITALOPRAM HYDROBROMIDE 20 MG PO TABS
20.0000 mg | ORAL_TABLET | Freq: Every day | ORAL | Status: DC
  Administered 2016-03-17 – 2016-03-22 (×6): 20 mg
  Filled 2016-03-17 (×7): qty 1

## 2016-03-17 MED ORDER — ASPIRIN 81 MG PO CHEW
81.0000 mg | CHEWABLE_TABLET | Freq: Every day | ORAL | Status: DC
  Administered 2016-03-17 – 2016-03-22 (×6): 81 mg
  Filled 2016-03-17 (×7): qty 1

## 2016-03-17 MED ORDER — BUPROPION HCL 75 MG PO TABS
75.0000 mg | ORAL_TABLET | Freq: Two times a day (BID) | ORAL | Status: DC
  Administered 2016-03-17 – 2016-03-22 (×10): 75 mg
  Filled 2016-03-17 (×10): qty 1

## 2016-03-17 NOTE — Progress Notes (Signed)
Spoke to [place] at [facility]. Pt has current order for Fentanyl prn bolus via infusion bag and we need that switched to prn IV push.  She will discuss with Dr Madalyn Rob.

## 2016-03-17 NOTE — Plan of Care (Signed)
Problem: Pain Managment: Goal: General experience of comfort will improve Outcome: Progressing Stopped iv gtt sedation. Getting IV prn pain control  Problem: Fluid Volume: Goal: Ability to maintain a balanced intake and output will improve Pt is very edematous. IV LAsix given this afternoon  Problem: Nutrition: Goal: Adequate nutrition will be maintained Outcome: Progressing TF at goal  Problem: Bowel/Gastric: Goal: Will not experience complications related to bowel motility Outcome: Progressing No BM for 2 days. Bowel regimen of senna and colace started today  Problem: ICU Phase Progression Outcomes Goal: Pain controlled with appropriate interventions Outcome: Progressing Tolerating prn pain and agitation control

## 2016-03-17 NOTE — Progress Notes (Signed)
Name: Ebony [name] MRN: [id number] DOB: 02/28/1946    ADMISSION DATE:  02/27/2016    CHIEF COMPLAINT: Acute respiratory distress and acute COPD exacerbation  HISTORY OF PRESENT ILLNESS:   Ebony [name] is a 70 year old female with medical history of HTN, DM,chronic respiratory failure due to COPD,sleep apnea, Hyperlipidemia, hypothyroidism, depression, morbid obesity who presented to [facility] on 4/16 with increased shortness of breath . She was placed on BiPAP and transitioned to HFNC, but the patient continues to complaint of increased shortness of breath, tightness, patient had a hard time to speak in full sentences. Patient was transferred to ICU for further and PCCM team was consulted for further management. Patient denies any chest pain, nausea, vomiting, dizziness , diaphoresis.  SUBJECTIVE: s/p trach #8 XLT  remains on full vent support,remains critically ill Failure to wean from vent,s/p PEG tube today Wheezing is less  this AM, less WOB today. LTACH referral needed  VITAL SIGNS: Temp:  [98 F (36.7 C)-98.7 F (37.1 C)] 98.3 F (36.8 C) (05/05 0800) Pulse Rate:  [64-111] 83 (05/05 0800) Resp:  [7-19] 15 (05/05 0800) BP: (113-178)/(48-69) 151/57 mmHg (05/05 0800) SpO2:  [92 %-96 %] 96 % (05/05 0800) FiO2 (%):  [35 %] 35 % (05/05 0800) Weight:  [258 lb 13.1 oz (117.4 kg)] 258 lb 13.1 oz (117.4 kg) (05/05 0302)  PHYSICAL EXAMINATION: Constitutional: RASS -2, + F/C on WUA  HEENT: PERRLA, oral cavity with less secretions s/p trach Lungs:  distant wheezes; right>left Cardiovascular: RRR, S1/S2, no MRG  Abdominal: Obese, non-distended; distant bowel sounds Neurological:  No focal deficits Ext: symmetric BUE and BLE edema    Recent Labs Lab 03/12/16 0622 03/12/16 2112 03/13/16 0511  NA 146* 146* 145  K 5.4* 4.6 4.5  CL 102 99* 99*  CO2 40* 43* 41*  BUN 43* 41* 45*  CREATININE 0.66 0.62 0.54  GLUCOSE 261* 245* 257*    Recent Labs Lab 03/11/16 0500  03/12/16 0453 03/13/16 0511  HGB 9.9* 9.4* 10.3*  HCT 30.8* 30.4* 32.7*  WBC 9.6 10.6 9.4  PLT 298 287 306   CXR: interstitial prominence   STUDIES:  4/4/ 17 >> CT chest was Negative for acute pulmonary embolism.Mild nodularity in the lung bases, with resolution of nodularopacities observed in November and development of a new 6 mm rightlower lobe nodule. This is most likely inflammatory given the interval changes. 03/06/16 CT Brain without contrast.>>negative Chest x-ray 4/26 images reviewed continued hyperinflation consistent with emphysema, continued bibasilar atelectasis compared with previous images, and we'll change. --Bronchoscopy 4/25; candidemia throughout airways. Tracheobronchomalacia.  --Echo results 4/25; EF 65%; no vegetations noted but study was technically difficult.   CULTURES: 04/25 Blood>>NGTD Sputum>mold Urine>NGTD C-diff: negative  ANTIBIOTICS: levofloxacin 4/16 >> 4/20 meropenem 4/21 >> 5/2 Vancomycin 4/21 >> 5/2 Voriconazole 4/24 >>    SIGNIFICANT EVENTS: 4/17>>AECOPD, Bronchitis, requiring continuous Bipap 4/18>>intermitted bipap, bridge with HFNC 02/28/18>> intubated.  4/25>> bronchoscopy, bronchomalacia, diffuse candidemia.   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 Multiple fluffy B nodular infiltrates c/w bronchopneumonia P:  Cont full vent support - settings reviewed and/or adjusted Cont vent bundle Wean to PS mode when able- CXR prn Nebulized steroids and  bronchodilator S/p trach   CARDIOVASCULAR A:  Shock, resolved Hypertension Sinus Tachycardia Hyperlipidemia P:  PRN  hydralazine PRN metoprolol Monitor hemodynamics MAP goal > 65 mmHg  RENAL A:  AKI - resolved P:  Monitor BMET intermittently Monitor I/Os Correct electrolytes as indicated Diurese prn   GASTROINTESTINAL A:  Elevated LFTs  Diarrhea-c-diff negative P:  SUP: enteral famotidine Cont TFs Monitor LFTs intermittently D/C all stool softeners S/p PEG tube  HEMATOLOGIC A:  ICU acquired anemia - no active bleeding P:  DVT px: LMWH Monitor CBC intermittently Transfuse per usual guidelines  INFECTIOUS A: Persistent fever-improved Candiduria 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 ID service following  ENDOCRINE A:  DM 2, poorly controlled Hypothyroidism P:  Cont SSI    NEUROLOGIC A:  ICU associated discomfort Acute encephalopathy P:  RASS goal: -1, -2  Cont fentanyl infusion     I have personally reviewed/obtained a history, examined the patient, evaluated Pertinent laboratory and RadioGraphic/imaging results, and  formulated the assessment and plan   The Patient requires high complexity decision making for assessment and support, frequent evaluation and titration of therapies, application of advanced monitoring technologies and extensive interpretation of multiple databases. Critical Care Time devoted to patient care services described in this note is 35 minutes.   Overall, patient is critically ill, prognosis is guarded.     [name], M.D.  [facility]  Medical Director [place] Director [facility] Cardio-Pulmonary Department

## 2016-03-17 NOTE — Care Management (Signed)
Spouse was to tour both [facility] and [facility] today.

## 2016-03-17 NOTE — Progress Notes (Addendum)
Pharmacy Antimicrobial Note  Ebony [name] is a 70 y.o. female admitted on 02/27/2016 with respiratory failure.  Pharmacy has been consulted for voriconazole dosing. Patient previously on vancomycin and meropenem.   Patient is currently on voriconazole (day #13). ID has been consulted and is following.   Plan: Continue current orders for voriconazole. Patient already received the two loading doses of 6 mg/kg (430 mg). Continue with current orders for 4 mg/kg (310 mg) IV q12h. Will check a trough level this PM. This is a send-out and will not result until next week. Will f/u level and adjust accordingly. Also, may consider changing to po next week if ID agrees.  Goal trough 1-5 mcg/ml.   Spoke to Dr. Ola Spurr who is ok with transitioning to po next week as long as patient continues to improve clinically. Will need to obtain suspension and either pause TF or change to bolus feeds with doses. Dietary aware and recommended another option of increasing TF rate to make up for time paused if necessary.   Height: 5\' 3"  (160 cm) Weight: 258 lb 13.1 oz (117.4 kg) IBW/kg (Calculated) : 52.4  Temp (24hrs), Avg:98.3 F (36.8 C), Min:98 F (36.7 C), Max:98.5 F (36.9 C)   Recent Labs Lab 03/11/16 0500 03/11/16 0732 03/12/16 0453 03/12/16 0622 03/12/16 2112 03/13/16 0511 03/17/16 1116  WBC 9.6  --  10.6  --   --  9.4  --   CREATININE 0.65  --   --  0.66 0.62 0.54 0.47  VANCOTROUGH  --  17  --   --   --   --   --     Estimated Creatinine Clearance: 84.5 mL/min (by C-G formula based on Cr of 0.47).    Allergies  Allergen Reactions  . Other Hives and Rash    Shellfish. Shellfish Pt reports no allergic reaction to iodine or betadine.  . Shellfish Allergy Hives and Rash   Antimicrobials this admission: levofloxacin 4/16 >> 4/20 meropenem 4/21 >> 5/2 Vancomycin 4/21 >> 5/2 Voriconazole 4/24 >>  Dose adjustments this admission: 4/24 Vancomycin changed from 1500 mg IV q8h to  q12h 4/26 vancomycin decreased to 1250 mg iv q 12 h due to trough of 26 mcg/ml 4/28 voriconazole increased to 310 mg q 12 h based on increased weight (4 mg/kg)  Microbiology results: 4/29 C diff: negative 4/29 UCx: NG 4/27 BCx: NG x 2 4/25 BAL: moderate growth mold 4/20 BCx: negative 4/20 UCx: Candida krusei  4/20 TA: moderate growth mold 4/17 MRSA PCR: negative  Thank you for allowing pharmacy to be a part of this patient's care.  Ulice Dash D, PharmD Clinical Pharmacist 03/17/2016 12:11 PM

## 2016-03-17 NOTE — Progress Notes (Signed)
Discuss with Dr Mortimer Fries pt's change in lung sounds. Very wet and wheezing.  Creatinine 0.47 Order for 60mg  Lasix given

## 2016-03-17 NOTE — Progress Notes (Signed)
Nutrition Follow-up  DOCUMENTATION CODES:   Morbid obesity  INTERVENTION:  -Current TF presecription providing 127 g of protein (2.4 g/kg IBW) and meets 100% RDIs. Noted pt with stage II pressure ulcer. Recommend continuing current TF regimen at present. Continue to assess. Recommend continuing continuous TF infusion until pt able to wean to trach collar and able to consistently tolerate >24 hours. Once this acheieved, can consider transitioning to bolus feedings. Continue to assess   NUTRITION DIAGNOSIS:   Inadequate oral intake related to acute illness as evidenced by NPO status.  Being addressed via TF  GOAL:   Provide needs based on ASPEN/SCCM guidelines  MONITOR:   Vent status, TF tolerance, Labs, Weight trends, I & O's  REASON FOR ASSESSMENT:   Consult, Ventilator Enteral/tube feeding initiation and management  ASSESSMENT:    70 yo female admitted with acute respiratory failure duw to acute on chronic COPD exacerbation with associated acute bronchitis, initially on Bipap, required intubation on 02/29/16   Pt remains on full vent support via trach, tolerating Vital High Protein at 60 ml/hr via PEG  Plan is transfer to [facility] when able  Diet Order:  Diet NPO time specified  Skin:   (stage II pressure ulcer on coccyx documented starting 03/17/15)  Last BM:  03/15/16   Labs: sodium wdl  Glucose Profile:   Recent Labs  03/17/16 0401 03/17/16 0748 03/17/16 1137  GLUCAP 153* 127* 94   Meds: ss novolog, solumedrol, bowel regimen  Height:   Ht Readings from Last 1 Encounters:  03/08/16 5\' 3"  (1.6 m)    Weight:   Wt Readings from Last 1 Encounters:  03/17/16 258 lb 13.1 oz (117.4 kg)    Ideal Body Weight:  52.3 kg  BMI:  Body mass index is 45.86 kg/(m^2).  Estimated Nutritional Needs:   Kcal:  [id number] kcals (11-14 kcals/kg current wt of 102.5 kg for BMI >30 using ASPEN guidelines)  Protein:  104-130 g/kg (2.0-2.5 g/kg IBW)  Fluid:  >/= 1.5  L  EDUCATION NEEDS:   Education needs no appropriate at this time  [place], [place], [place] [phone] Pager  [phone] Weekend/On-Call Pager

## 2016-03-17 NOTE — Progress Notes (Signed)
Inpatient Diabetes Program Recommendations  AACE/ADA: New Consensus Statement on Inpatient Glycemic Control (2015)  Target Ranges:  Prepandial:   less than 140 mg/dL      Peak postprandial:   less than 180 mg/dL (1-2 hours)      Critically ill patients:  140 - 180 mg/dL   Review of Glycemic Control  Results for MILANIE, FEST (MRN [id number]) as of 03/17/2016 10:34  Ref. Range 03/16/2016 07:22 03/16/2016 11:13 03/16/2016 16:04 03/16/2016 19:20 03/16/2016 23:53 03/17/2016 04:01 03/17/2016 07:48  Glucose-Capillary Latest Ref Range: 65-99 mg/dL 123 (H) 81 180 (H) 217 (H) 214 (H) 153 (H) 127 (H)    Current orders for Inpatient glycemic control: Lantus 30 units bid, Novolog 7 units q4h, Novolog correction 0-20 units q4h  Inpatient Diabetes Program Recommendations: Consider increasing Novolog tube feed coverage to 8 units q4h since tube feed rate was increased from 50 to 60 cc/hr yesterday.  Hold tube feed insulin if tube feeds are held.  Gentry Fitz, RN, BA, MHA, CDE Diabetes Coordinator Inpatient Diabetes Program  [phone] (Team Pager) [phone] ([place]) 03/17/2016 10:37 AM

## 2016-03-17 NOTE — Progress Notes (Signed)
eLink Physician-Brief Progress Note Patient Name: Ebony [name] DOB: 30-Jan-1946 MRN: [id number]   Date of Service  03/17/2016  HPI/Events of Note  Fentanyl IV infusion now D/Ced. Request for Fentanyl IV PRN.  eICU Interventions  Will order Fentanyl IV PRN.     Intervention Category Intermediate Interventions: Pain - evaluation and management  Ebony [name] 03/17/2016, 6:51 PM

## 2016-03-17 NOTE — Progress Notes (Signed)
[place] NOTE  Pharmacy Consult for Constipation Prevention    Allergies  Allergen Reactions  . Other Hives and Rash    Shellfish. Shellfish Pt reports no allergic reaction to iodine or betadine.  . Shellfish Allergy Hives and Rash    Patient Measurements: Height: _0  (160 cm) Weight: 258 lb 13.1 oz (117.4 kg) IBW/kg (Calculated) : 52.4  Vital Signs: Temp: 98.3 F (36.8 C) (05/05 0800) Temp Source: Oral (05/05 0800) BP: 116/52 mmHg (05/05 1100) Pulse Rate: 92 (05/05 1100) Intake/Output from previous day: 05/04 0701 - 05/05 0700 In: 2782.8 [I.V.:650.8; NG/GT:1770; IV Piggyback:362] Out: 1660 [Urine:1225] Intake/Output from this shift: Total I/O In: 587.7 [I.V.:147.7; NG/GT:440] Out: -  Vent settings for last 24 hours: Vent Mode:  [-] Spontaneous FiO2 (%):  [35 %] 35 % Set Rate:  [14 bmp] 14 bmp Vt Set:  [550 mL] 550 mL PEEP:  [5 cmH20] 5 cmH20 Pressure Support:  [10 cmH20] 10 cmH20  Labs:  Recent Labs  03/17/16 1116  CREATININE 0.47  MG 2.1  PHOS 3.2   Estimated Creatinine Clearance: 84.5 mL/min (by C-G formula based on Cr of 0.47).   Recent Labs  03/17/16 0401 03/17/16 0748 03/17/16 1137  GLUCAP 153* 127* 3    Microbiology: Recent Results (from the past 720 hour(s))  MRSA PCR Screening     Status: None   Collection Time: 02/28/16  4:43 PM  Result Value Ref Range Status   MRSA by PCR NEGATIVE NEGATIVE Final    Comment:        The GeneXpert MRSA Assay (FDA approved for NASAL specimens only), is one component of a comprehensive MRSA colonization surveillance program. It is not intended to diagnose MRSA infection nor to guide or monitor treatment for MRSA infections.   Culture, blood (Routine X 2) w Reflex to ID Panel     Status: None   Collection Time: 03/02/16  2:05 PM  Result Value Ref Range Status   Specimen Description BLOOD RIGHT HAND  Final   Special Requests   Final    BOTTLES DRAWN AEROBIC AND ANAEROBIC  ANA 2ML AER .5ML   Culture NO GROWTH 5 DAYS  Final   Report Status 03/07/2016 FINAL  Final  Culture, expectorated sputum-assessment     Status: None   Collection Time: 03/02/16  3:25 PM  Result Value Ref Range Status   Specimen Description TRACHEAL ASPIRATE  Final   Special Requests NONE  Final   Sputum evaluation THIS SPECIMEN IS ACCEPTABLE FOR SPUTUM CULTURE  Final   Report Status 03/02/2016 FINAL  Final  Culture, respiratory (NON-Expectorated)     Status: None (Preliminary result)   Collection Time: 03/02/16  3:25 PM  Result Value Ref Range Status   Specimen Description TRACHEAL ASPIRATE  Final   Special Requests NONE Reflexed from [id number]  Final   Gram Stain MODERATE WBC SEEN FEW HYPHAE   Final   Culture   Final    MODERATE GROWTH MOLD REFERRED TO [facility] IN [place], [place] IDENTIFICATION/CONFIRMATION    Report Status PENDING  Incomplete  Culture, blood (Routine X 2) w Reflex to ID Panel     Status: None   Collection Time: 03/02/16  3:34 PM  Result Value Ref Range Status   Specimen Description BLOOD RIGHT HAND  Final   Special Requests BOTTLES DRAWN AEROBIC AND ANAEROBIC   10CC  Final   Culture NO GROWTH 5 DAYS  Final   Report Status  03/07/2016 FINAL  Final  Urine culture     Status: Abnormal   Collection Time: 03/02/16  5:19 PM  Result Value Ref Range Status   Specimen Description URINE, RANDOM  Final   Special Requests NONE  Final   Culture >=100,000 COLONIES/mL CANDIDA KRUSEI (A)  Final   Report Status 03/05/2016 FINAL  Final  Culture, fungus without smear ([facility]-Only)     Status: None (Preliminary result)   Collection Time: 03/07/16 12:14 PM  Result Value Ref Range Status   Specimen Description BRONCHIAL WASHINGS  Final   Special Requests NONE  Final   Culture   Final    MOLD IDENTIFICATION TO FOLLOW REFERRED TO [facility] IN [place], [place] IDENTIFICATION/CONFIRMATION    Report Status PENDING   Incomplete  Culture, expectorated sputum-assessment     Status: None   Collection Time: 03/07/16 12:14 PM  Result Value Ref Range Status   Specimen Description BRONCHIAL ALVEOLAR LAVAGE  Final   Special Requests NONE  Final   Sputum evaluation THIS SPECIMEN IS ACCEPTABLE FOR SPUTUM CULTURE  Final   Report Status 03/07/2016 FINAL  Final  Culture, respiratory (NON-Expectorated)     Status: None   Collection Time: 03/07/16 12:14 PM  Result Value Ref Range Status   Specimen Description BRONCHIAL ALVEOLAR LAVAGE  Final   Special Requests NONE Reflexed from [id number]  Final   Gram Stain MODERATE WBC SEEN NO ORGANISMS SEEN   Final   Culture   Final    MODERATE GROWTH MOLD PREVIOUS SPECIMEN ALREADY SENT TO [facility] FOR IDENTITFICATION    Report Status 03/09/2016 FINAL  Final  Culture, blood (Routine X 2) w Reflex to ID Panel     Status: None   Collection Time: 03/09/16  2:55 PM  Result Value Ref Range Status   Specimen Description BLOOD RIGHT HAND  Final   Special Requests BOTTLES DRAWN AEROBIC AND ANAEROBIC  1CC  Final   Culture NO GROWTH 5 DAYS  Final   Report Status 03/14/2016 FINAL  Final  Culture, blood (Routine X 2) w Reflex to ID Panel     Status: None   Collection Time: 03/09/16  4:10 PM  Result Value Ref Range Status   Specimen Description BLOOD RIGHT HAND  Final   Special Requests BOTTLES DRAWN AEROBIC AND ANAEROBIC  1CC  Final   Culture NO GROWTH 5 DAYS  Final   Report Status 03/14/2016 FINAL  Final  Urine culture     Status: None   Collection Time: 03/11/16  6:21 AM  Result Value Ref Range Status   Specimen Description URINE, CLEAN CATCH  Final   Special Requests NONE  Final   Culture NO GROWTH 2 DAYS  Final   Report Status 03/13/2016 FINAL  Final  C difficile quick scan w PCR reflex     Status: None   Collection Time: 03/11/16  1:13 PM  Result Value Ref Range Status   C Diff antigen NEGATIVE NEGATIVE Final   C Diff toxin NEGATIVE NEGATIVE Final   C Diff  interpretation Negative for C. difficile  Final    Medications:  Scheduled:  . antiseptic oral rinse  7 mL Mouth Rinse 10 times per day  . budesonide (PULMICORT) nebulizer solution  0.5 mg Nebulization BID  . chlorhexidine gluconate (SAGE KIT)  15 mL Mouth Rinse BID  . sennosides  5 mL Oral BID   And  . docusate  100 mg Oral BID  . enoxaparin (LOVENOX) injection  40 mg Subcutaneous Q12H  . famotidine (PEPCID) IV  20 mg Intravenous Q12H  . free water  200 mL Per Tube Q4H  . insulin aspart  0-20 Units Subcutaneous Q4H  . insulin aspart  8 Units Subcutaneous Q4H  . insulin glargine  30 Units Subcutaneous BID  . ipratropium-albuterol  3 mL Nebulization Q4H  . levothyroxine  112.5 mcg Intravenous Daily  . methylPREDNISolone (SOLU-MEDROL) injection  10 mg Intravenous Q12H  . voriconazole  4 mg/kg (Adjusted) Intravenous Q12H   Infusions:  . feeding supplement (VITAL HIGH PROTEIN) 1,000 mL (03/17/16 0018)  . fentaNYL infusion INTRAVENOUS Stopped (03/17/16 1055)    Assessment: Pharmacy consulted to assist in constipation prevention in this 69 y/o F with AECOPD currently intubated and sedated.   Plan:  Last BM 5/3. Will begin senna/docusate bid and f/u AM.   Ulice Dash D 03/17/2016,12:33 PM

## 2016-03-18 DIAGNOSIS — L8990 Pressure ulcer of unspecified site, unspecified stage: Secondary | ICD-10-CM

## 2016-03-18 LAB — BASIC METABOLIC PANEL
ANION GAP: 6 (ref 5–15)
BUN: 30 mg/dL — ABNORMAL HIGH (ref 6–20)
CALCIUM: 9.5 mg/dL (ref 8.9–10.3)
CHLORIDE: 91 mmol/L — AB (ref 101–111)
CO2: 41 mmol/L — AB (ref 22–32)
CREATININE: 0.41 mg/dL — AB (ref 0.44–1.00)
GFR calc Af Amer: >60 mL/min (ref >60)
GFR calc non Af Amer: >60 mL/min (ref >60)
GLUCOSE: 139 mg/dL — AB (ref 65–99)
POTASSIUM: 4.4 mmol/L (ref 3.5–5.1)
Sodium: 138 mmol/L (ref 135–145)

## 2016-03-18 LAB — CBC
HCT: 35 % (ref 35.0–47.0)
HEMOGLOBIN: 11.3 g/dL — AB (ref 12.0–16.0)
MCH: 29.8 pg (ref 26.0–34.0)
MCHC: 32.2 g/dL (ref 32.0–36.0)
MCV: 92.5 fL (ref 80.0–100.0)
PLATELETS: 249 10*3/uL (ref 150–440)
RBC: 3.78 MIL/uL — ABNORMAL LOW (ref 3.80–5.20)
RDW: 14 % (ref 11.5–14.5)
WBC: 9.9 10*3/uL (ref 3.6–11.0)

## 2016-03-18 LAB — GLUCOSE, CAPILLARY
GLUCOSE-CAPILLARY: 114 mg/dL — AB (ref 65–99)
GLUCOSE-CAPILLARY: 84 mg/dL (ref 65–99)
GLUCOSE-CAPILLARY: 87 mg/dL (ref 65–99)
Glucose-Capillary: 109 mg/dL — ABNORMAL HIGH (ref 65–99)
Glucose-Capillary: 84 mg/dL (ref 65–99)
Glucose-Capillary: 98 mg/dL (ref 65–99)

## 2016-03-18 NOTE — Progress Notes (Signed)
Name: Ebony [name] MRN: [id number] DOB: [birth year]    ADMISSION DATE:  02/27/2016    CHIEF COMPLAINT: Acute respiratory distress and acute COPD exacerbation  HISTORY OF PRESENT ILLNESS:   Ebony [name] is a 70 year old female with medical history of HTN, DM,chronic respiratory failure due to COPD,sleep apnea, Hyperlipidemia, hypothyroidism, depression, morbid obesity who presented to [facility] on 4/16 with increased shortness of breath . She was placed on BiPAP and transitioned to HFNC, but the patient continues to complaint of increased shortness of breath, tightness, patient had a hard time to speak in full sentences. Patient was transferred to ICU for further and PCCM team was consulted for further management. Patient denies any chest pain, nausea, vomiting, dizziness , diaphoresis.  SUBJECTIVE: s/p trach #8 XLT  remains on full vent support,remains critically ill, placed back on PRVC due to increased WOB and wheezing Failure to wean from vent,s/p PEG tube, +Wheezing  LTACH referral needed, lasix given yesterday  VITAL SIGNS: Temp:  [97.6 F (36.4 C)-98.6 F (37 C)] 98.2 F (36.8 C) (05/06 0739) Pulse Rate:  [72-114] 98 (05/06 0900) Resp:  [7-20] 17 (05/06 0900) BP: (99-178)/(38-86) 166/50 mmHg (05/06 0900) SpO2:  [93 %-97 %] 93 % (05/06 0900) FiO2 (%):  [30 %-35 %] 30 % (05/06 0900) Weight:  [265 lb 14 oz (120.6 kg)] 265 lb 14 oz (120.6 kg) (05/06 0700)  PHYSICAL EXAMINATION: Constitutional: RASS -2, + F/C on WUA  HEENT: PERRLA, oral cavity with less secretions s/p trach Lungs:  distant wheezes; right>left Cardiovascular: RRR, S1/S2, no MRG  Abdominal: Obese, non-distended; distant bowel sounds Neurological:  No focal deficits Ext: symmetric BUE and BLE edema    Recent Labs Lab 03/13/16 0511 03/17/16 1116 03/18/16 0441  NA 145 142 138  K 4.5 4.3 4.4  CL 99* 96* 91*  CO2 41* 39* 41*  BUN 45* 30* 30*  CREATININE 0.54 0.47 0.41*  GLUCOSE 257* 111* 139*    Recent  Labs Lab 03/12/16 0453 03/13/16 0511 03/18/16 0441  HGB 9.4* 10.3* 11.3*  HCT 30.4* 32.7* 35.0  WBC 10.6 9.4 9.9  PLT 287 306 249   CXR: interstitial prominence   STUDIES:  4/4/ 17 >> CT chest was Negative for acute pulmonary embolism.Mild nodularity in the lung bases, with resolution of nodularopacities observed in November and development of a new 6 mm rightlower lobe nodule. This is most likely inflammatory given the interval changes. 03/06/16 CT Brain without contrast.>>negative Chest x-ray 4/26 images reviewed continued hyperinflation consistent with emphysema, continued bibasilar atelectasis compared with previous images, and we'll change. --Bronchoscopy 4/25; candidemia throughout airways. Tracheobronchomalacia.  --Echo results 4/25; EF 65%; no vegetations noted but study was technically difficult.   CULTURES: 04/25 Blood>>NGTD Sputum>mold Urine>NGTD C-diff: negative  ANTIBIOTICS: levofloxacin 4/16 >> 4/20 meropenem 4/21 >> 5/2 Vancomycin 4/21 >> 5/2 Voriconazole 4/24 >>    SIGNIFICANT EVENTS: 4/17>>AECOPD, Bronchitis, requiring continuous Bipap 4/18>>intermitted bipap, bridge with HFNC 02/28/18>> intubated.  4/25>> bronchoscopy, bronchomalacia, diffuse candidemia.  4/29-difficult to wean from vent 5/2  S/p trach 5/3 PEg tube palced  LINES/TUBES: ETT 4/18>>5/2 Trach 5/2 PICC Triple lumen 04/24>   ASSESSMENT / PLAN  70 yo obese whit female with acute on resp failure from acute COPD exacerbation from pneumonia with fungal pneumonia, failure to wean from vent s/p trach s/p PEG tube  PULMONARY A: Acute on chronic hypercapnic respiratory failure COPD with severe persistent obstruction Prolonged VDRF OHS -B nodular infiltrates c/w bronchopneumonia P:  Cont full vent support - settings  reviewed and/or adjusted Cont vent bundle Wean to PS mode when able- CXR prn Nebulized steroids and bronchodilator S/p trach   CARDIOVASCULAR A:  Shock,  resolved Hypertension Sinus Tachycardia Hyperlipidemia P:  PRN hydralazine PRN metoprolol Monitor hemodynamics MAP goal > 65 mmHg  RENAL A:  AKI - resolved P:  Monitor BMET intermittently Monitor I/Os Correct electrolytes as indicated Diurese prn   GASTROINTESTINAL A:  Elevated LFTs  Diarrhea-c-diff negative P:  SUP: enteral famotidine Cont TFs Monitor LFTs intermittently D/C all stool softeners S/p PEG tube  HEMATOLOGIC A:  ICU acquired anemia - no active bleeding P:  DVT px: LMWH Monitor CBC intermittently Transfuse per usual guidelines  INFECTIOUS A: Persistent fever-improved Candiduria 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 ID service following  ENDOCRINE A:  DM 2, poorly controlled Hypothyroidism P:  Cont SSI    NEUROLOGIC A:  ICU associated discomfort Acute encephalopathy P:  RASS goal: -1, -2  Cont fentanyl infusion     I have personally reviewed/obtained a history, examined the patient, evaluated Pertinent laboratory and RadioGraphic/imaging results, and  formulated the assessment and plan   The Patient requires high complexity decision making for assessment and support, frequent evaluation and titration of therapies, application of advanced monitoring technologies and extensive interpretation of multiple databases. Critical Care Time devoted to patient care services described in this note is 35 minutes.   Overall, patient is critically ill, prognosis is guarded.     [name], M.D.  [facility]  Medical Director [place] Director [facility] Cardio-Pulmonary Department

## 2016-03-18 NOTE — Progress Notes (Signed)
Pharmacy Antimicrobial Note  Ebony [name] is a 70 y.o. female admitted on 02/27/2016 with respiratory failure.  Pharmacy has been consulted for voriconazole dosing. Patient previously on vancomycin and meropenem.   Patient is currently on voriconazole (day #13). ID has been consulted and is following.   Plan: Continue current orders for voriconazole. Patient already received the two loading doses of 6 mg/kg (430 mg). Continue with current orders for 4 mg/kg (310 mg) IV q12h. Will check a trough level this PM. This is a send-out and will not result until next week. Will f/u level and adjust accordingly. Also, may consider changing to po next week if ID agrees.  Goal trough 1-5 mcg/ml.   Spoke to Dr. Ola Spurr who is ok with transitioning to po next week as long as patient continues to improve clinically. Will need to obtain suspension and either pause TF or change to bolus feeds with doses. Dietary aware and recommended another option of increasing TF rate to make up for time paused if necessary.   Height: 5\' 3"  (160 cm) Weight: 265 lb 14 oz (120.6 kg) IBW/kg (Calculated) : 52.4  Temp (24hrs), Avg:97.9 F (36.6 C), Min:97.6 F (36.4 C), Max:98.5 F (36.9 C)   Recent Labs Lab 03/12/16 0453 03/12/16 0622 03/12/16 2112 03/13/16 0511 03/17/16 1116 03/18/16 0441  WBC 10.6  --   --  9.4  --  9.9  CREATININE  --  0.66 0.62 0.54 0.47 0.41*    Estimated Creatinine Clearance: 85.9 mL/min (by C-G formula based on Cr of 0.41).    Allergies  Allergen Reactions  . Other Hives and Rash    Shellfish. Shellfish Pt reports no allergic reaction to iodine or betadine.  . Shellfish Allergy Hives and Rash   Antimicrobials this admission: levofloxacin 4/16 >> 4/20 meropenem 4/21 >> 5/2 Vancomycin 4/21 >> 5/2 Voriconazole 4/24 >>  Dose adjustments this admission: 4/24 Vancomycin changed from 1500 mg IV q8h to q12h 4/26 vancomycin decreased to 1250 mg iv q 12 h due to trough of 26  mcg/ml 4/28 voriconazole increased to 310 mg q 12 h based on increased weight (4 mg/kg)  Microbiology results: 4/29 C diff: negative 4/29 UCx: NG 4/27 BCx: NG x 2 4/25 BAL: moderate growth mold 4/20 BCx: negative 4/20 UCx: Candida krusei  4/20 TA: moderate growth mold 4/17 MRSA PCR: negative  Thank you for allowing pharmacy to be a part of this patient's care.  Noralee Space, PharmD Clinical Pharmacist 03/18/2016 2:24 PM

## 2016-03-18 NOTE — Progress Notes (Signed)
[place] NOTE  Pharmacy Consult for Constipation Prevention    Allergies  Allergen Reactions  . Other Hives and Rash    Shellfish. Shellfish Pt reports no allergic reaction to iodine or betadine.  . Shellfish Allergy Hives and Rash    Patient Measurements: Height: 5' 3"  (160 cm) Weight: 265 lb 14 oz (120.6 kg) IBW/kg (Calculated) : 52.4  Vital Signs: Temp: 98.2 F (36.8 C) (05/06 0739) Temp Source: Oral (05/06 0739) BP: 163/50 mmHg (05/06 1000) Pulse Rate: 101 (05/06 1000) Intake/Output from previous day: 05/05 0701 - 05/06 0700 In: 3705.7 [I.V.:853.7; NG/GT:2440; IV Piggyback:312] Out: 3975 [Urine:3975] Intake/Output from this shift: Total I/O In: 330 [I.V.:40; NG/GT:240; IV Piggyback:50] Out: 750 [Urine:750] Vent settings for last 24 hours: Vent Mode:  [-] PRVC FiO2 (%):  [30 %-35 %] 30 % Set Rate:  [14 bmp] 14 bmp Vt Set:  [500 mL-550 mL] 550 mL PEEP:  [5 cmH20] 5 cmH20 Pressure Support:  [10 cmH20] 10 cmH20  Labs:  Recent Labs  03/17/16 1116 03/18/16 0441  WBC  --  9.9  HGB  --  11.3*  HCT  --  35.0  PLT  --  249  CREATININE 0.47 0.41*  MG 2.1  --   PHOS 3.2  --    Estimated Creatinine Clearance: 85.9 mL/min (by C-G formula based on Cr of 0.41).   Recent Labs  03/18/16 0353 03/18/16 0745 03/18/16 1149  GLUCAP 114* 84 98     Medications:  Scheduled:  . antiseptic oral rinse  7 mL Mouth Rinse 10 times per day  . aspirin  81 mg Per Tube Daily  . budesonide (PULMICORT) nebulizer solution  0.5 mg Nebulization BID  . buPROPion  75 mg Per Tube BID  . chlorhexidine gluconate (SAGE KIT)  15 mL Mouth Rinse BID  . citalopram  20 mg Per Tube Daily  . sennosides  5 mL Oral BID   And  . docusate  100 mg Oral BID  . enoxaparin (LOVENOX) injection  40 mg Subcutaneous Q12H  . famotidine (PEPCID) IV  20 mg Intravenous Q12H  . free water  200 mL Per Tube Q4H  . insulin aspart  0-20 Units Subcutaneous Q4H  . insulin aspart  8  Units Subcutaneous Q4H  . insulin glargine  30 Units Subcutaneous BID  . ipratropium-albuterol  3 mL Nebulization Q4H  . letrozole  2.5 mg Oral Daily  . levothyroxine  112.5 mcg Intravenous Daily  . losartan  25 mg Per Tube Daily  . methylPREDNISolone (SOLU-MEDROL) injection  10 mg Intravenous Q12H  . voriconazole  4 mg/kg (Adjusted) Intravenous Q12H   Infusions:  . feeding supplement (VITAL HIGH PROTEIN) 1,000 mL (03/18/16 1225)  . fentaNYL infusion INTRAVENOUS Stopped (03/17/16 1055)    Assessment: Pharmacy consulted to assist in constipation prevention in this 69 y/o F with AECOPD currently intubated and sedated.   Plan:  Last BM 5/3. Will begin senna/docusate bid and f/u AM.   5/6- Continue docusate/senna, (Miralax, bisacodyl ordered PRN). On continuous tube feeds  Aradia Estey A 03/18/2016,2:21 PM

## 2016-03-19 ENCOUNTER — Inpatient Hospital Stay: Payer: Medicare Other

## 2016-03-19 LAB — GLUCOSE, CAPILLARY
GLUCOSE-CAPILLARY: 66 mg/dL (ref 65–99)
GLUCOSE-CAPILLARY: 94 mg/dL (ref 65–99)
GLUCOSE-CAPILLARY: 74 mg/dL (ref 65–99)
GLUCOSE-CAPILLARY: 59 mg/dL — AB (ref 65–99)
GLUCOSE-CAPILLARY: 97 mg/dL (ref 65–99)
Glucose-Capillary: 66 mg/dL (ref 65–99)
Glucose-Capillary: 71 mg/dL (ref 65–99)
Glucose-Capillary: 81 mg/dL (ref 65–99)
Glucose-Capillary: 85 mg/dL (ref 65–99)

## 2016-03-19 LAB — LIPASE, BLOOD: Lipase: 22 U/L (ref 11–51)

## 2016-03-19 MED ORDER — LACTULOSE 10 GM/15ML PO SOLN
20.0000 g | Freq: Two times a day (BID) | ORAL | Status: DC
  Administered 2016-03-19 (×2): 20 g via ORAL
  Filled 2016-03-19 (×2): qty 30

## 2016-03-19 MED ORDER — DEXTROSE 50 % IV SOLN
INTRAVENOUS | Status: AC
  Administered 2016-03-19: 25 mL via INTRAVENOUS
  Filled 2016-03-19: qty 50

## 2016-03-19 MED ORDER — DEXTROSE 50 % IV SOLN
25.0000 mL | Freq: Once | INTRAVENOUS | Status: AC
  Administered 2016-03-19: 25 mL via INTRAVENOUS
  Filled 2016-03-19: qty 50

## 2016-03-19 NOTE — Progress Notes (Signed)
Pharmacy Antimicrobial Note  Ebony [name] is a 70 y.o. female admitted on 02/27/2016 with respiratory failure.  Pharmacy has been consulted for Voriconazole dosing-Fungal pneumonia?. Patient previously on vancomycin and meropenem.   Patient is currently on voriconazole (day #14). ID has been consulted and is following.   Plan: Continue current orders for voriconazole. Patient already received the two loading doses of 6 mg/kg (430 mg). Continue with current orders for 4 mg/kg (310 mg) IV q12h. Will check a trough level this PM. This is a send-out and will not result until next week. Will f/u level and adjust accordingly. Also, may consider changing to po next week if ID agrees.  Goal trough 1-5 mcg/ml.   Spoke to Dr. Ola Spurr who is ok with transitioning to po next week as long as patient continues to improve clinically. Will need to obtain suspension and either pause TF or change to bolus feeds with doses. Dietary aware and recommended another option of increasing TF rate to make up for time paused if necessary.   Height: 5\' 3"  (160 cm) Weight: 258 lb 6.1 oz (117.2 kg) IBW/kg (Calculated) : 52.4  Temp (24hrs), Avg:98.3 F (36.8 C), Min:98 F (36.7 C), Max:98.7 F (37.1 C)   Recent Labs Lab 03/12/16 2112 03/13/16 0511 03/17/16 1116 03/18/16 0441  WBC  --  9.4  --  9.9  CREATININE 0.62 0.54 0.47 0.41*    Estimated Creatinine Clearance: 84.3 mL/min (by C-G formula based on Cr of 0.41).    Allergies  Allergen Reactions  . Other Hives and Rash    Shellfish. Shellfish Pt reports no allergic reaction to iodine or betadine.  . Shellfish Allergy Hives and Rash   Antimicrobials this admission: levofloxacin 4/16 >> 4/20 meropenem 4/21 >> 5/2 Vancomycin 4/21 >> 5/2 Voriconazole 4/24 >>  Dose adjustments this admission: 4/24 Vancomycin changed from 1500 mg IV q8h to q12h 4/26 vancomycin decreased to 1250 mg iv q 12 h due to trough of 26 mcg/ml 4/28 Voriconazole increased to  310 mg q 12 h based on increased weight (4 mg/kg)  Microbiology results: 4/29 C diff: negative 4/29 UCx: NG 4/27 BCx: NG x 2 4/25 BAL: moderate growth mold 4/20 BCx: negative 4/20 UCx: Candida krusei  4/20 TA: moderate growth mold 4/17 MRSA PCR: negative  Thank you for allowing pharmacy to be a part of this patient's care.  Noralee Space, PharmD Clinical Pharmacist 03/19/2016 11:35 AM

## 2016-03-19 NOTE — Progress Notes (Signed)
No Bm since 03/16/2016.  Discussed with Dr. Mortimer Fries. Orders obtained.

## 2016-03-19 NOTE — Progress Notes (Signed)
1800 better day. Only vomitted once this morning. Concerned about transfer to [place] facility this week. Mouths words appropriately. Ask questions. Holds call bell in hand. Slept most of day- stated she didn't sleep last nite.Always pleasant and smiling. [place] daughter read to her today.

## 2016-03-19 NOTE — Progress Notes (Signed)
PHARMACY - CRITICAL CARE PROGRESS NOTE  Pharmacy Consult for Constipation Prevention    Allergies  Allergen Reactions  . Other Hives and Rash    Shellfish. Shellfish Pt reports no allergic reaction to iodine or betadine.  . Shellfish Allergy Hives and Rash    Patient Measurements: Height: 5' 3" (160 cm) Weight: 258 lb 6.1 oz (117.2 kg) IBW/kg (Calculated) : 52.4  Vital Signs: Temp: 98.7 F (37.1 C) (05/07 0800) Temp Source: Oral (05/07 0400) BP: 111/58 mmHg (05/07 1100) Pulse Rate: 82 (05/07 1100) Intake/Output from previous day: 05/06 0701 - 05/07 0700 In: 2277 [I.V.:230; NG/GT:1685; IV Piggyback:362] Out: 3850 [Urine:3850] Intake/Output from this shift: Total I/O In: 40 [I.V.:40] Out: 350 [Urine:350] Vent settings for last 24 hours: Vent Mode:  [-] PRVC FiO2 (%):  [28 %-30 %] 28 % Set Rate:  [14 bmp] 14 bmp Vt Set:  [550 mL] 550 mL PEEP:  [5 cmH20] 5 cmH20 Plateau Pressure:  [31 cmH20] 31 cmH20  Labs:  Recent Labs  03/17/16 1116 03/18/16 0441  WBC  --  9.9  HGB  --  11.3*  HCT  --  35.0  PLT  --  249  CREATININE 0.47 0.41*  MG 2.1  --   PHOS 3.2  --    Estimated Creatinine Clearance: 84.3 mL/min (by C-G formula based on Cr of 0.41).   Recent Labs  03/19/16 0354 03/19/16 0439 03/19/16 0713  GLUCAP 66 94 74     Medications:  Scheduled:  . antiseptic oral rinse  7 mL Mouth Rinse 10 times per day  . aspirin  81 mg Per Tube Daily  . budesonide (PULMICORT) nebulizer solution  0.5 mg Nebulization BID  . buPROPion  75 mg Per Tube BID  . chlorhexidine gluconate (SAGE KIT)  15 mL Mouth Rinse BID  . citalopram  20 mg Per Tube Daily  . sennosides  5 mL Oral BID   And  . docusate  100 mg Oral BID  . enoxaparin (LOVENOX) injection  40 mg Subcutaneous Q12H  . famotidine (PEPCID) IV  20 mg Intravenous Q12H  . free water  200 mL Per Tube Q4H  . insulin aspart  0-20 Units Subcutaneous Q4H  . insulin aspart  8 Units Subcutaneous Q4H  . insulin glargine   30 Units Subcutaneous BID  . ipratropium-albuterol  3 mL Nebulization Q4H  . lactulose  20 g Oral BID  . letrozole  2.5 mg Oral Daily  . levothyroxine  112.5 mcg Intravenous Daily  . losartan  25 mg Per Tube Daily  . methylPREDNISolone (SOLU-MEDROL) injection  10 mg Intravenous Q12H  . voriconazole  4 mg/kg (Adjusted) Intravenous Q12H   Infusions:  . feeding supplement (VITAL HIGH PROTEIN) Stopped (03/19/16 0425)  . fentaNYL infusion INTRAVENOUS Stopped (03/17/16 1055)    Assessment: Pharmacy consulted to assist in constipation prevention in this 69 y/o F with AECOPD currently intubated and sedated.   Plan:  Last BM charted as 5/3. Will begin senna/docusate bid and f/u AM.   5/6- Continue docusate/senna, (Miralax, bisacodyl ordered PRN). On continuous tube feeds  5/7- Lactulose bid ordered per MD along with Docusate/senna. (Miralax, bisacodyl ordered PRN). TF held d/t not tolerating. Consider increasing Docusate/Senna dose?  Merrill,Kristin A 03/19/2016,11:15 AM   

## 2016-03-19 NOTE — Progress Notes (Signed)
Name: Ebony [name] MRN: [id number] DOB: [birth year]    ADMISSION DATE:  02/27/2016    CHIEF COMPLAINT: Acute respiratory distress and acute COPD exacerbation  HISTORY OF PRESENT ILLNESS:   Ebony [name] is a 70 year old female with medical history of HTN, DM,chronic respiratory failure due to COPD,sleep apnea, Hyperlipidemia, hypothyroidism, depression, morbid obesity who presented to [facility] on 4/16 with increased shortness of breath . She was placed on BiPAP and transitioned to HFNC, but the patient continues to complaint of increased shortness of breath, tightness, patient had a hard time to speak in full sentences. Patient was transferred to ICU for further and PCCM team was consulted for further management. Patient denies any chest pain, nausea, vomiting, dizziness , diaphoresis.  SUBJECTIVE: s/p trach #8 XLT  remains on full vent support,placed back on PRVC due to increased WOB and wheezing Failure to wean from vent,s/p PEG tube, +Wheezing  Patient with vomiting episodes, tube feeds on hold, alert and awake LTACH referral   VITAL SIGNS: Temp:  [98 F (36.7 C)-98.2 F (36.8 C)] 98 F (36.7 C) (05/07 0400) Pulse Rate:  [73-117] 86 (05/07 0610) Resp:  [14-28] 14 (05/07 0610) BP: (127-193)/(43-146) 127/54 mmHg (05/07 0610) SpO2:  [90 %-96 %] 90 % (05/07 0610) FiO2 (%):  [28 %-30 %] 28 % (05/07 0410) Weight:  [258 lb 6.1 oz (117.2 kg)] 258 lb 6.1 oz (117.2 kg) (05/07 0428)  PHYSICAL EXAMINATION: Constitutional: RASS -2, + F/C on WUA  HEENT: PERRLA, oral cavity with less secretions s/p trach Lungs:  distant wheezes; right>left Cardiovascular: RRR, S1/S2, no MRG  Abdominal: Obese, non-distended; distant bowel sounds Neurological:  No focal deficits Ext: symmetric BUE and BLE edema    Recent Labs Lab 03/13/16 0511 03/17/16 1116 03/18/16 0441  NA 145 142 138  K 4.5 4.3 4.4  CL 99* 96* 91*  CO2 41* 39* 41*  BUN 45* 30* 30*  CREATININE 0.54 0.47 0.41*  GLUCOSE 257* 111*  139*    Recent Labs Lab 03/13/16 0511 03/18/16 0441  HGB 10.3* 11.3*  HCT 32.7* 35.0  WBC 9.4 9.9  PLT 306 249   CXR: interstitial prominence   STUDIES:  4/4/ 17 >> CT chest was Negative for acute pulmonary embolism.Mild nodularity in the lung bases, with resolution of nodularopacities observed in November and development of a new 6 mm rightlower lobe nodule. This is most likely inflammatory given the interval changes. 03/06/16 CT Brain without contrast.>>negative Chest x-ray 4/26 images reviewed continued hyperinflation consistent with emphysema, continued bibasilar atelectasis compared with previous images, and we'll change. --Bronchoscopy 4/25; candidemia throughout airways. Tracheobronchomalacia.  --Echo results 4/25; EF 65%; no vegetations noted but study was technically difficult.   CULTURES: 04/25 Blood>>NGTD Sputum>mold Urine>NGTD C-diff: negative  ANTIBIOTICS: levofloxacin 4/16 >> 4/20 meropenem 4/21 >> 5/2 Vancomycin 4/21 >> 5/2 Voriconazole 4/24 >>    SIGNIFICANT EVENTS: 4/17>>AECOPD, Bronchitis, requiring continuous Bipap 4/18>>intermitted bipap, bridge with HFNC 02/28/18>> intubated.  4/25>> bronchoscopy, bronchomalacia, diffuse candidemia.  4/29-difficult to wean from vent 5/2  S/p trach 5/3 PEg tube palced 5/5 failed PS mode LINES/TUBES: ETT 4/18>>5/2 Trach 5/2 PICC Triple lumen 04/24>   ASSESSMENT / PLAN  70 yo obese whit female with acute on resp failure from acute COPD exacerbation from pneumonia with fungal pneumonia, failure to wean from vent s/p trach s/p PEG tube  PULMONARY A: Acute on chronic hypercapnic respiratory failure COPD with severe persistent obstruction Prolonged VDRF OHS -B nodular infiltrates c/w bronchopneumonia P:  Cont full vent support -  settings reviewed and/or adjusted Cont vent bundle Wean to PS mode when able- CXR prn Nebulized steroids and bronchodilator S/p trach   CARDIOVASCULAR A:  Shock,  resolved Hypertension Sinus Tachycardia Hyperlipidemia P:  PRN hydralazine PRN metoprolol Monitor hemodynamics MAP goal > 65 mmHg  RENAL A:  AKI - resolved P:  Monitor BMET intermittently Monitor I/Os Correct electrolytes as indicated Diurese prn   GASTROINTESTINAL A:  Elevated LFTs  Diarrhea-c-diff negative P:  TF' son hold Check lipase S/p PEG tube  HEMATOLOGIC A:  ICU acquired anemia - no active bleeding P:  DVT px: LMWH Monitor CBC intermittently Transfuse per usual guidelines  INFECTIOUS A: Persistent fever-improved Candiduria 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 ID service following  ENDOCRINE A:  DM 2, poorly controlled Hypothyroidism P:  Cont SSI    NEUROLOGIC A:  ICU associated discomfort Acute encephalopathy P:  RASS goal: -1, -2  Cont fentanyl infusion     I have personally reviewed/obtained a history, examined the patient, evaluated Pertinent laboratory and [employer]/imaging results, and  formulated the assessment and plan   The Patient requires high complexity decision making for assessment and support, frequent evaluation and titration of therapies, application of advanced monitoring technologies and extensive interpretation of multiple databases. Critical Care Time devoted to patient care services described in this note is 35 minutes.   Overall, patient is critically ill, prognosis is guarded.     [name], M.D.  [facility]  Medical Director [place] Director [facility] Cardio-Pulmonary Department

## 2016-03-20 LAB — GLUCOSE, CAPILLARY
GLUCOSE-CAPILLARY: 154 mg/dL — AB (ref 65–99)
Glucose-Capillary: 107 mg/dL — ABNORMAL HIGH (ref 65–99)
Glucose-Capillary: 110 mg/dL — ABNORMAL HIGH (ref 65–99)
Glucose-Capillary: 113 mg/dL — ABNORMAL HIGH (ref 65–99)
Glucose-Capillary: 113 mg/dL — ABNORMAL HIGH (ref 65–99)
Glucose-Capillary: 94 mg/dL (ref 65–99)

## 2016-03-20 LAB — BASIC METABOLIC PANEL
Anion gap: 7 (ref 5–15)
BUN: 22 mg/dL — AB (ref 6–20)
CALCIUM: 9 mg/dL (ref 8.9–10.3)
CO2: 37 mmol/L — ABNORMAL HIGH (ref 22–32)
CREATININE: 0.33 mg/dL — AB (ref 0.44–1.00)
Chloride: 95 mmol/L — ABNORMAL LOW (ref 101–111)
GFR calc Af Amer: >60 mL/min (ref >60)
Glucose, Bld: 126 mg/dL — ABNORMAL HIGH (ref 65–99)
Potassium: 4.4 mmol/L (ref 3.5–5.1)
SODIUM: 139 mmol/L (ref 135–145)

## 2016-03-20 LAB — CBC
HEMATOCRIT: 32.4 % — AB (ref 35.0–47.0)
Hemoglobin: 10.5 g/dL — ABNORMAL LOW (ref 12.0–16.0)
MCH: 30 pg (ref 26.0–34.0)
MCHC: 32.4 g/dL (ref 32.0–36.0)
MCV: 92.7 fL (ref 80.0–100.0)
PLATELETS: 206 10*3/uL (ref 150–440)
RBC: 3.49 MIL/uL — ABNORMAL LOW (ref 3.80–5.20)
RDW: 14.4 % (ref 11.5–14.5)
WBC: 6 10*3/uL (ref 3.6–11.0)

## 2016-03-20 MED ORDER — METOCLOPRAMIDE HCL 5 MG/ML IJ SOLN
10.0000 mg | Freq: Three times a day (TID) | INTRAMUSCULAR | Status: AC
  Administered 2016-03-20 – 2016-03-21 (×5): 10 mg via INTRAVENOUS
  Filled 2016-03-20 (×5): qty 2

## 2016-03-20 MED ORDER — POTASSIUM CHLORIDE 10 MEQ/100ML IV SOLN: 10.0000 meq | INTRAVENOUS | Status: DC

## 2016-03-20 MED ORDER — INSULIN GLARGINE 100 UNIT/ML ~~LOC~~ SOLN
30.0000 [IU] | Freq: Every day | SUBCUTANEOUS | Status: DC
  Administered 2016-03-21 – 2016-03-22 (×2): 30 [IU] via SUBCUTANEOUS
  Filled 2016-03-20 (×2): qty 0.3

## 2016-03-20 MED ORDER — VITAL HIGH PROTEIN PO LIQD
1000.0000 mL | ORAL | Status: DC
  Administered 2016-03-20 – 2016-03-21 (×4): 1000 mL

## 2016-03-20 MED ORDER — FREE WATER
30.0000 mL | Status: DC
  Administered 2016-03-20 – 2016-03-22 (×12): 30 mL

## 2016-03-20 MED ORDER — FAMOTIDINE 20 MG PO TABS
20.0000 mg | ORAL_TABLET | Freq: Two times a day (BID) | ORAL | Status: DC
  Administered 2016-03-20 – 2016-03-22 (×5): 20 mg via ORAL
  Filled 2016-03-20 (×5): qty 1

## 2016-03-20 NOTE — Care Management (Signed)
Case discussed with Seth Bake, [facility] representative. She has spoken with spouse and he has chosen [facility] for [employer]. Before authorization can begin patient must be off all drips to transfer to [facility] per Seth Bake. Patient currently on Fentanyl gtt. Spoke with Dr. Felicie Morn and updated him on requirements.   The goal is to begin [facility] authorization today.

## 2016-03-20 NOTE — Progress Notes (Addendum)
Patient alert and oriented. No complaints of pain- tolerated pressure support 8 and peep of 5 for several hours- then tolerated trach collar for about 1 hr today then became short of breath and oxygen sats dropped to high 80's.  Bath given- urine output adequate.  Patient resting comfortably.

## 2016-03-20 NOTE — Progress Notes (Signed)
Name: Ebony [name] MRN: [id number] DOB: 10-08-1946    ADMISSION DATE:  02/27/2016    CHIEF COMPLAINT: Acute respiratory distress and acute COPD exacerbation  HISTORY OF PRESENT ILLNESS:   Ebony [name] is a 70 year old female with medical history of HTN, DM,chronic respiratory failure due to COPD,sleep apnea, Hyperlipidemia, hypothyroidism, depression, morbid obesity who presented to [facility] on 4/16 with increased shortness of breath . She was placed on BiPAP and transitioned to HFNC, but the patient continues to complaint of increased shortness of breath, tightness, patient had a hard time to speak in full sentences. Patient was transferred to ICU for further and PCCM team was consulted for further management. Patient denies any chest pain, nausea, vomiting, dizziness , diaphoresis.  SUBJECTIVE: s/p trach #8 XLT  No acute issues overnight. Nausea and vomiting resolved. Trickle feeds restarted; tolerating well. Awake and following commands. Remains on full vent support. Nods in disapproval to pain. Awaiting LTAC placement   VITAL SIGNS: Temp:  [97.8 F (36.6 C)-98.7 F (37.1 C)] 98.2 F (36.8 C) (05/08 0500) Pulse Rate:  [65-95] 85 (05/08 0600) Resp:  [13-17] 15 (05/08 0600) BP: (78-141)/(35-68) 111/51 mmHg (05/08 0600) SpO2:  [89 %-94 %] 89 % (05/08 0600) FiO2 (%):  [28 %] 28 % (05/08 0344) Weight:  [248 lb 3.8 oz (112.6 kg)] 248 lb 3.8 oz (112.6 kg) (05/08 0526)  PHYSICAL EXAMINATION: Constitutional: RASS -2, + F/C on WUA  HEENT: PERRLA, oral cavity with less secretions s/p trach Lungs:  distant wheezes; right>left Cardiovascular: RRR, S1/S2, no MRG  Abdominal: Obese, non-distended; distant bowel sounds Neurological:  No focal deficits Ext: symmetric BUE and BLE edema Skin: Peg and trach sites clean and dry    Recent Labs Lab 03/17/16 1116 03/18/16 0441 03/20/16 0441  NA 142 138 139  K 4.3 4.4 4.4  CL 96* 91* 95*  CO2 39* 41* 37*  BUN 30* 30* 22*  CREATININE 0.47  0.41* 0.33*  GLUCOSE 111* 139* 126*    Recent Labs Lab 03/18/16 0441 03/20/16 0441  HGB 11.3* 10.5*  HCT 35.0 32.4*  WBC 9.9 6.0  PLT 249 206   CXR: interstitial prominence   STUDIES:  4/4/ 17 >> CT chest was Negative for acute pulmonary embolism.Mild nodularity in the lung bases, with resolution of nodularopacities observed in November and development of a new 6 mm rightlower lobe nodule. This is most likely inflammatory given the interval changes. 03/06/16 CT Brain without contrast.>>negative Chest x-ray 4/26 images reviewed continued hyperinflation consistent with emphysema, continued bibasilar atelectasis compared with previous images, and we'll change. --Bronchoscopy 4/25; candidemia throughout airways. Tracheobronchomalacia.  --Echo results 4/25; EF 65%; no vegetations noted but study was technically difficult.   CULTURES: 04/25 Blood>>NGTD Sputum>mold Urine>NGTD C-diff: negative  ANTIBIOTICS: levofloxacin 4/16 >> 4/20 meropenem 4/21 >> 5/2 Vancomycin 4/21 >> 5/2 Voriconazole 4/24 >>  SIGNIFICANT EVENTS: 4/17>>AECOPD, Bronchitis, requiring continuous Bipap 4/18>>intermitted bipap, bridge with HFNC 02/28/18>> intubated.  4/25>> bronchoscopy, bronchomalacia, diffuse candidemia.  4/29-difficult to wean from vent 5/2  S/p trach 5/3 PEg tube palced  LINES/TUBES: ETT 4/18>>5/2 Trach 5/2 PICC Triple lumen 04/24>   ASSESSMENT / PLAN  70 yo obese whit female with acute on resp failure from acute COPD exacerbation from pneumonia with fungal pneumonia, failure to wean from vent s/p trach s/p PEG tube  PULMONARY A: Acute on chronic hypercapnic respiratory failure COPD with severe persistent obstruction Prolonged VDRF OHS -B nodular infiltrates c/w bronchopneumonia P:  Cont full vent support - settings reviewed and/or  adjusted Cont vent bundle Wean to PS mode when able- CXR prn Nebulized steroids and bronchodilator S/p trach and peg Aspiration  precautions   CARDIOVASCULAR A:  Shock, resolved Hypertension Sinus Tachycardia Hyperlipidemia P:  PRN hydralazine PRN metoprolol Monitor hemodynamics MAP goal > 65 mmHg  RENAL A:  AKI - resolved P:  Monitor BMET intermittently Monitor I/Os Correct electrolytes as indicated Diurese prn  GASTROINTESTINAL A:  Elevated LFTs  Diarrhea-c-diff negative Nausea and vomiting-Resloved P:  SUP: enteral famotidine Resume TFs and titrate gradually to goal.  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 Tukov ANP-BC Pulmonary and Critical Care Medicine [facility] Pager [phone] or [phone]  Pt Seen and examined with NP and agree with assessement and plan. Continued chronic hypercapnic respiratory failure, continues to be vent dependent. Physical exam shows that the lungs are clear to auscultation with reduced air entry bilaterally. We will work on weaning the vent as tolerated, continue tube feeds at 20 mL per hour. Marda Stalker M.D. 03/20/2016  Critical Care Attestation.  I have personally obtained a history, examined the patient, evaluated laboratory and imaging results, formulated the assessment and plan and placed orders. The Patient requires high complexity decision making for assessment and support, frequent evaluation and titration of therapies, application of advanced monitoring technologies and extensive interpretation of multiple  databases. The patient has critical illness that could lead imminently to failure of 1 or more organ systems and requires the highest level of physician preparedness to intervene.  Critical Care Time devoted to patient care services described in this note is 35 minutes and is exclusive of time spent in procedures.

## 2016-03-20 NOTE — Evaluation (Signed)
Physical Therapy Evaluation Patient Details Name: SENDY ALFONSO MRN: [id number] DOB: 09-22-1946 Today's Date: 03/20/2016   History of Present Illness  Pt is a 70 y/o female that presented with shortness of breath due to COPD exacerbation. Initially placed on BiPap, then HFNC, unable to tolerate and ultimately intubated and placed on mechanical vent. She now has PEG tube and Trach collar.  Clinical Impression  Patient appears awake, alert, and oriented as PT enters the room with family in as well. Patient has had an extensive stay in ICU due to respiratory decline. At baseline she ambulates 30-40' at a time with supplemental O2 and RW. PT deferred any bed mobility today as patient is still on continuous mechanical ventilation and has become quite deconditioned. She demonstrates loss of strength vs ROM at R foot/ankle compared to her L side. No sensory loss or obvious skin breakdown identified in her LEs. She requires assistance to complete all ROM exercises in this session and is markedly below her baseline. It sounds like LTAC is one route being explored, which would be appropriate for her given her current clinical scenario. She is quite declined from her baseline level of mobility and will require extensive PT services to return to independent mobility.     Follow Up Recommendations LTACH    Equipment Recommendations       Recommendations for Other Services       Precautions / Restrictions Precautions Precautions: Fall Restrictions Weight Bearing Restrictions: No      Mobility  Bed Mobility               General bed mobility comments: Deferred as patient is on mechanical vent   Transfers                    Ambulation/Gait                Stairs            Wheelchair Mobility    Modified Rankin (Stroke Patients Only)       Balance                                             Pertinent Vitals/Pain Pain Assessment:  No/denies pain    Home Living Family/patient expects to be discharged to:: Private residence Living Arrangements: Spouse/significant other Available Help at Discharge: Family Type of Home: House Home Access: Stairs to enter   Technical brewer of Steps: 2 Home Layout: One level Home Equipment: Environmental consultant - 2 wheels      Prior Function Level of Independence: Independent with assistive device(s)         Comments: Prior to this admission patient was ambulating with O2 for short distances ~ 30-40' with RW     Hand Dominance        Extremity/Trunk Assessment   Upper Extremity Assessment: RUE deficits/detail;LUE deficits/detail RUE Deficits / Details: Unable to fully assess due to multiple IV leads and vent. Able to provide 4-/5 squeeze of hand      LUE Deficits / Details: Squeeze 4-/5, able to complete 3/5 MMT elbow flexion, 3-/5 shoulder flexion (AAROM able to touch forehead).    Lower Extremity Assessment: RLE deficits/detail;LLE deficits/detail RLE Deficits / Details: Decreased AROM at ankle DF/PF (unable to complete full range) decreased toe flexion/extension.  LLE Deficits / Details: Able to complete heel slides with AAROM.  Full toe and ankle flexion/extension.      Communication   Communication: Tracheostomy  Cognition Arousal/Alertness: Awake/alert Behavior During Therapy: WFL for tasks assessed/performed Overall Cognitive Status: Within Functional Limits for tasks assessed                      General Comments General comments (skin integrity, edema, etc.): Minor abrasions on bilateral calves, placed pillows under both to reduce skin breakdown, educated on maintaining knee extension.     Exercises General Exercises - Lower Extremity Ankle Circles/Pumps: AAROM;Both;10 reps Heel Slides: AAROM;Both;10 reps Hip ABduction/ADduction: AAROM;Both;10 reps Straight Leg Raises: AAROM;Both;10 reps      Assessment/Plan    PT Assessment Patient needs  continued PT services  PT Diagnosis Difficulty walking;Generalized weakness   PT Problem List Decreased strength;Decreased knowledge of use of DME;Decreased skin integrity;Decreased activity tolerance;Decreased balance;Cardiopulmonary status limiting activity;Decreased mobility  PT Treatment Interventions DME instruction;Gait training;Therapeutic activities;Therapeutic exercise;Balance training;Functional mobility training   PT Goals (Current goals can be found in the Care Plan section) Acute Rehab PT Goals Patient Stated Goal: To get stronger  PT Goal Formulation: With patient/family Time For Goal Achievement: 04/03/16 Potential to Achieve Goals: Fair    Frequency Min 2X/week   Barriers to discharge        Co-evaluation               End of Session Equipment Utilized During Treatment: Oxygen Activity Tolerance: Patient tolerated treatment well Patient left: in bed;with call bell/phone within reach;with family/visitor present Nurse Communication: Mobility status         Time: [id number] PT Time Calculation (min) (ACUTE ONLY): 11 min   Charges:   PT Evaluation $PT Eval Moderate Complexity: 1 Procedure     PT G Codes:       Kerman Passey, PT, DPT    03/20/2016, 3:50 PM

## 2016-03-20 NOTE — Progress Notes (Signed)
Placed on 35:% trach collar per MD order,tolerating well. Spo2 92-94%

## 2016-03-20 NOTE — Progress Notes (Signed)
Nutrition Follow-up  DOCUMENTATION CODES:   Morbid obesity  INTERVENTION:  -Tube feeding rated decreased to 55ml/hr after vomiting episode >24 hr ago. Titration orders in place if tolerating increase by 46ml q 4 hr until goal rate of 90ml/hr achieved.  Discussed with RN, Charli  NUTRITION DIAGNOSIS:   Inadequate oral intake related to acute illness as evidenced by NPO status.    GOAL:   Provide needs based on ASPEN/SCCM guidelines    MONITOR:   Vent status, TF tolerance, Labs, Weight trends, I & O's  REASON FOR ASSESSMENT:   Consult, Ventilator Enteral/tube feeding initiation and management  ASSESSMENT:    70 yo female admitted with acute respiratory failure duw to acute on chronic COPD exacerbation with associated acute bronchitis, initially on Bipap, required intubation on 02/29/16  Medications reviewed, reglan added then discontinued Labs reviewed BUN 22, creatinine 0.33, glucose 126  Noted documentation of vomiting on 5/7 at 4:00 times 2 episodes.  No vomiting since then per RN, Charli. Vital High protein tube feeding off after episode then restarted on 5/7 at 20:30 at 15ml/hr. No signs of intolerance at this time.    Diet Order:  Diet NPO time specified  Skin:   (stage II pressure ulcer on coccyx documented starting 03/17/15)  Last BM:  5/7 2 small BMs noted after vomiting  Height:   Ht Readings from Last 1 Encounters:  03/08/16 5\' 3"  (1.6 m)    Weight:   Wt Readings from Last 1 Encounters:  03/20/16 248 lb 3.8 oz (112.6 kg)    Ideal Body Weight:  52.3 kg  BMI:  Body mass index is 43.98 kg/(m^2).  Estimated Nutritional Needs:   Kcal:  [id number] kcals (11-14 kcals/kg current wt of 102.5 kg for BMI >30 using ASPEN guidelines)  Protein:  104-130 g/kg (2.0-2.5 g/kg IBW)  Fluid:  >/= 1.5 L  EDUCATION NEEDS:   Education needs no appropriate at this time  Nancy Manuele B. Zenia Resides, [place], [place] (pager) Weekend/On-Call pager [phone])

## 2016-03-21 LAB — BASIC METABOLIC PANEL
ANION GAP: 8 (ref 5–15)
BUN: 21 mg/dL — AB (ref 6–20)
CO2: 35 mmol/L — AB (ref 22–32)
Calcium: 9 mg/dL (ref 8.9–10.3)
Chloride: 95 mmol/L — ABNORMAL LOW (ref 101–111)
Creatinine, Ser: 0.44 mg/dL (ref 0.44–1.00)
GFR calc Af Amer: >60 mL/min (ref >60)
GFR calc non Af Amer: >60 mL/min (ref >60)
GLUCOSE: 136 mg/dL — AB (ref 65–99)
POTASSIUM: 3.8 mmol/L (ref 3.5–5.1)
Sodium: 138 mmol/L (ref 135–145)

## 2016-03-21 LAB — CBC
HEMATOCRIT: 33.3 % — AB (ref 35.0–47.0)
Hemoglobin: 10.7 g/dL — ABNORMAL LOW (ref 12.0–16.0)
MCH: 29.4 pg (ref 26.0–34.0)
MCHC: 32.2 g/dL (ref 32.0–36.0)
MCV: 91.3 fL (ref 80.0–100.0)
Platelets: 212 10*3/uL (ref 150–440)
RBC: 3.65 MIL/uL — AB (ref 3.80–5.20)
RDW: 14.6 % — ABNORMAL HIGH (ref 11.5–14.5)
WBC: 7.1 10*3/uL (ref 3.6–11.0)

## 2016-03-21 LAB — GLUCOSE, CAPILLARY
GLUCOSE-CAPILLARY: 117 mg/dL — AB (ref 65–99)
GLUCOSE-CAPILLARY: 171 mg/dL — AB (ref 65–99)
Glucose-Capillary: 134 mg/dL — ABNORMAL HIGH (ref 65–99)
Glucose-Capillary: 114 mg/dL — ABNORMAL HIGH (ref 65–99)
Glucose-Capillary: 172 mg/dL — ABNORMAL HIGH (ref 65–99)

## 2016-03-21 LAB — MISC LABCORP TEST (SEND OUT): Labcorp test code: 700353

## 2016-03-21 MED ORDER — ALPRAZOLAM 0.25 MG PO TABS
0.2500 mg | ORAL_TABLET | Freq: Once | ORAL | Status: AC
  Administered 2016-03-22: 0.25 mg via ORAL
  Filled 2016-03-21: qty 1

## 2016-03-21 MED ORDER — ALTEPLASE 2 MG IJ SOLR
2.0000 mg | Freq: Once | INTRAMUSCULAR | Status: AC | PRN
  Administered 2016-03-21: 2 mg
  Filled 2016-03-21: qty 2

## 2016-03-21 MED ORDER — SENNOSIDES-DOCUSATE SODIUM 8.6-50 MG PO TABS
1.0000 | ORAL_TABLET | Freq: Two times a day (BID) | ORAL | Status: DC
  Administered 2016-03-21 – 2016-03-22 (×3): 1 via ORAL
  Filled 2016-03-21 (×3): qty 1

## 2016-03-21 MED ORDER — ZOLPIDEM TARTRATE 5 MG PO TABS
5.0000 mg | ORAL_TABLET | Freq: Every day | ORAL | Status: DC
  Administered 2016-03-21: 5 mg via ORAL
  Filled 2016-03-21: qty 1

## 2016-03-21 MED ORDER — STERILE WATER FOR INJECTION IJ SOLN
INTRAMUSCULAR | Status: AC
  Administered 2016-03-21: 10 mL
  Filled 2016-03-21: qty 10

## 2016-03-21 NOTE — Progress Notes (Signed)
Nutrition Follow-up  DOCUMENTATION CODES:   Morbid obesity  INTERVENTION:  -Continue vital high protein at goal rate of 73ml/hr to meet nutritional needs   NUTRITION DIAGNOSIS:   Inadequate oral intake related to acute illness as evidenced by NPO status.    GOAL:   Provide needs based on ASPEN/SCCM guidelines    MONITOR:   Vent status, TF tolerance, Labs, Weight trends, I & O's  REASON FOR ASSESSMENT:   Consult, Ventilator Enteral/tube feeding initiation and management  ASSESSMENT:    70 yo female admitted with acute respiratory failure duw to acute on chronic COPD exacerbation with associated acute bronchitis, initially on Bipap, required intubation on 02/29/16   Tolerated trach collar for about 1 hr yesterday.   Medications reviewed reglan Labs reviewed  Diet Order:  Diet NPO time specified  Skin:   (stage II pressure ulcer on coccyx documented starting 03/17/15)  Last BM:  5/7 2 small BMs noted after vomiting  Height:   Ht Readings from Last 1 Encounters:  03/08/16 5\' 3"  (1.6 m)    Weight:   Wt Readings from Last 1 Encounters:  03/21/16 252 lb 6.8 oz (114.5 kg)    Ideal Body Weight:  52.3 kg  BMI:  Body mass index is 44.73 kg/(m^2).  Estimated Nutritional Needs:   Kcal:  [id number] kcals (11-14 kcals/kg current wt of 102.5 kg for BMI >30 using ASPEN guidelines)  Protein:  104-130 g/kg (2.0-2.5 g/kg IBW)  Fluid:  >/= 1.5 L  EDUCATION NEEDS:   Education needs no appropriate at this time  Yoni Lobos B. Zenia Resides, [place], [place] (pager) Weekend/On-Call pager [phone])

## 2016-03-21 NOTE — Progress Notes (Signed)
[facility] Progess Note    ASSESSMENT/PLAN   ASSESSMENT / PLAN  70 yo obese whit female with acute on resp failure from acute COPD exacerbation from pneumonia with fungal pneumonia, failure to wean from vent s/p trach s/p PEG tube  PULMONARY A: Acute on chronic hypercapnic respiratory failure COPD with severe persistent obstruction Prolonged VDRF, Currently on vent at PRVC/14/550/PEEP of 5. FiO2 of 28%. The patient tolerated weaning trial at 10/5 for approximately 4 hours yesterday. OHS -B nodular infiltrates c/w bronchopneumonia P:  Cont full vent support - settings reviewed and/or adjusted Cont vent bundle We'll continue pressure support wean, with goal of trach collar. Nebulized bronchodilator, given the patient's tracheal candidiasis, we'll stop budesonide. S/p trach and peg Aspiration precautions  CARDIOVASCULAR A:  Shock, resolved Hypertension, now well controlled. P:  Continue Cozaar  RENAL A:  AKI - resolved P:  Monitor BMET intermittently Monitor I/Os Correct electrolytes as indicated Diurese prn  GASTROINTESTINAL A:  Elevated LFTs  Diarrhea-c-diff negative P:  SUP: enteral famotidine Continue tube feeds TFs and titrate gradually to goal.  Monitor LFTs intermittently S/p PEG tube   HEMATOLOGIC A:  ICU acquired anemia - no active bleeding P:  DVT px: LMWH Monitor CBC intermittently Transfuse per usual guidelines  INFECTIOUS A: Persistent fever-resolved.  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  Micro/culture results:  BCx2 4/27: Negative UC 4/29: Negative. 4/20: Candida krusei Sputum/Bronchoscopy  Antibiotics: levofloxacin 4/16 >> 4/20 meropenem 4/21 >> 5/2 Vancomycin 4/21 >> 5/2 Voriconazole 4/24 >>  ENDOCRINE A:  DM 2, adequately  controlled. Hypothyroidism P:  Currently on Lantus 30 units every morning, plus sliding scale insulin. Will continue.  NEUROLOGIC A:  ICU associated discomfort Acute encephalopathy, which is now resolved. P:  RASS goal: -1, -2  The patient has been transitioned off fentanyl infusion, and has been adequately maintained on when necessary pushes.    Best Practices  DVT Prophylaxis: Lovenox 40 mg every 12. GI Prophylaxis: Famotidine 20 mg twice a day.   ---------------------------------------   ----------------------------------------   Name: Ebony [name] MRN: [id number] DOB: [birth year]    ADMISSION DATE:  02/27/2016     SUBJECTIVE:   Pt currently on the ventilator, can not provide history or review of systems.     VITAL SIGNS: Temp:  [98.1 F (36.7 C)-98.3 F (36.8 C)] 98.1 F (36.7 C) (05/09 0500) Pulse Rate:  [55-88] 70 (05/09 0600) Resp:  [13-18] 14 (05/09 0600) BP: (102-135)/(53-73) 127/56 mmHg (05/09 0600) SpO2:  [89 %-97 %] 94 % (05/09 0600) FiO2 (%):  [28 %] 28 % (05/09 0712) Weight:  [252 lb 6.8 oz (114.5 kg)] 252 lb 6.8 oz (114.5 kg) (05/09 0500) HEMODYNAMICS:   VENTILATOR SETTINGS: Vent Mode:  [-] PSV FiO2 (%):  [28 %] 28 % Set Rate:  [14 bmp] 14 bmp Vt Set:  [550 mL] 550 mL PEEP:  [5 cmH20] 5 cmH20 Pressure Support:  [5 cmH20] 5 cmH20 INTAKE / OUTPUT:  Intake/Output Summary (Last 24 hours) at 03/21/16 0731 Last data filed at 03/21/16 0600  Gross per 24 hour  Intake   1069 ml  Output   1475 ml  Net   -406 ml    PHYSICAL EXAMINATION: Physical Examination:   VS: BP 127/56 mmHg  Pulse 70  Temp(Src) 98.1 F (36.7 C) (Oral)  Resp 14  Ht 5\' 3"  (1.6 m)  Wt 252 lb 6.8 oz (  114.5 kg)  BMI 44.73 kg/m2  SpO2 94%  General Appearance: No distress  Neuro:without focal findings, mental status normal. HEENT: PERRLA, EOM intact.Tracheostomy in place, site appears clean. Pulmonary: normal breath sounds  , Decreased  bilaterally. CardiovascularNormal S1,S2.  No m/r/g.   Abdomen: Benign, Soft, non-tender. Renal:  No costovertebral tenderness  GU:  Not performed at this time. Endocrine: No evident thyromegaly. Skin:   warm, no rashes, no ecchymosis  Extremities: normal, no cyanosis, clubbing.   LABS:   LABORATORY PANEL:   CBC  Recent Labs Lab 03/21/16 0450  WBC 7.1  HGB 10.7*  HCT 33.3*  PLT 212    Chemistries   Recent Labs Lab 03/17/16 1116  03/21/16 0450  NA 142  < > 138  K 4.3  < > 3.8  CL 96*  < > 95*  CO2 39*  < > 35*  GLUCOSE 111*  < > 136*  BUN 30*  < > 21*  CREATININE 0.47  < > 0.44  CALCIUM 9.4  < > 9.0  MG 2.1  --   --   PHOS 3.2  --   --   < > = values in this interval not displayed.   Recent Labs Lab 03/20/16 0740 03/20/16 1131 03/20/16 1604 03/20/16 1955 03/20/16 2348 03/21/16 0349  GLUCAP 154* 107* 94 113* 110* 117*   No results for input(s): PHART, PCO2ART, PO2ART in the last 168 hours. No results for input(s): AST, ALT, ALKPHOS, BILITOT, ALBUMIN in the last 168 hours.  Cardiac Enzymes No results for input(s): TROPONINI in the last 168 hours.  RADIOLOGY:  Dg Chest Port 1 View  03/19/2016  CLINICAL DATA:  Central catheter placement EXAM: PORTABLE CHEST 1 VIEW COMPARISON:  Mar 22, 2016 FINDINGS: Tracheostomy catheter tip is 6.4 cm above the carina. Central catheter tip is in the superior vena cava. No pneumothorax. There is airspace consolidation in the left base abutting the left hemidiaphragm. There is mild atelectasis in the right base. Heart is mildly enlarged with pulmonary vascularity within normal limits. No adenopathy. IMPRESSION: Tube and catheter positions as described without pneumothorax. Left lower lobe consolidation. Right base atelectasis. Mild cardiac enlargement. Electronically Signed   By: Lowella Grip III M.D.   On: 03/19/2016 09:55       --Marda Stalker, MD.  ICU Pager: [phone] [facility] Office Number: [phone]  Patricia Pesa, M.D.  Vilinda Boehringer, M.D.  Merton Border, M.D  03/21/2016   Critical Care Attestation.  I have personally obtained a history, examined the patient, evaluated laboratory and imaging results, formulated the assessment and plan and placed orders. The Patient requires high complexity decision making for assessment and support, frequent evaluation and titration of therapies, application of advanced monitoring technologies and extensive interpretation of multiple databases. The patient has critical illness that could lead imminently to failure of 1 or more organ systems and requires the highest level of physician preparedness to intervene.  Critical Care Time devoted to patient care services described in this note is 35 minutes and is exclusive of time spent in procedures.

## 2016-03-21 NOTE — Progress Notes (Signed)
PT Cancellation Note  Patient Details Name: RON FINEBERG MRN: [id number] DOB: October 30, 1946   Cancelled Treatment:    Reason Eval/Treat Not Completed: Patient at procedure or test/unavailable. Pt's chart reviewed and discussed with RN. Pt is currently getting a bath and is unavailable to participate at this time. PT will f/u at a later time and resume therapy when appropriate.   Neoma Laming, PT, DPT  03/21/2016, 3:50 PM [phone]

## 2016-03-21 NOTE — Progress Notes (Signed)
eLink Physician-Brief Progress Note Patient Name: Ebony [name] DOB: 01/09/1946 MRN: [id number]   Date of Service  03/21/2016  HPI/Events of Note  Anxiety.  eICU Interventions  Will order Xanax 0.25 mg via PEG X 1.      Intervention Category Minor Interventions: Agitation / anxiety - evaluation and management  [name] 03/21/2016, 10:36 PM

## 2016-03-22 ENCOUNTER — Inpatient Hospital Stay: Payer: Medicare Other

## 2016-03-22 ENCOUNTER — Ambulatory Visit (HOSPITAL_COMMUNITY)
Admission: AD | Admit: 2016-03-22 | Discharge: 2016-03-22 | Disposition: A | Discharge status: Home / Self Care | Payer: Medicare Other | Source: Other Acute Inpatient Hospital | Attending: Internal Medicine | Admitting: Internal Medicine

## 2016-03-22 DIAGNOSIS — J9690 Respiratory failure, unspecified, unspecified whether with hypoxia or hypercapnia: Secondary | ICD-10-CM | POA: Insufficient documentation

## 2016-03-22 LAB — BLOOD GAS, ARTERIAL
ALLENS TEST (PASS/FAIL): POSITIVE — AB
Acid-Base Excess: 18.2 mmol/L — ABNORMAL HIGH (ref 0.0–3.0)
Bicarbonate: 47.1 mEq/L — ABNORMAL HIGH (ref 21.0–28.0)
FIO2: 0.35
MECHVT: 450 mL
O2 Saturation: 94.5 %
PATIENT TEMPERATURE: 37
PEEP: 5 cmH2O
PH ART: 7.4 (ref 7.350–7.450)
RATE: 24 resp/min
pCO2 arterial: 76 mmHg (ref 32.0–48.0)
pO2, Arterial: 73 mmHg — ABNORMAL LOW (ref 83.0–108.0)

## 2016-03-22 LAB — COMPREHENSIVE METABOLIC PANEL
ALK PHOS: 83 U/L (ref 38–126)
ALT: 350 U/L — ABNORMAL HIGH (ref 14–54)
ANION GAP: 5 (ref 5–15)
AST: 188 U/L — ABNORMAL HIGH (ref 15–41)
Albumin: 2.2 g/dL — ABNORMAL LOW (ref 3.5–5.0)
BILIRUBIN TOTAL: 1 mg/dL (ref 0.3–1.2)
BUN: 16 mg/dL (ref 6–20)
CALCIUM: 8.1 mg/dL — AB (ref 8.9–10.3)
CO2: 32 mmol/L (ref 22–32)
Chloride: 104 mmol/L (ref 101–111)
Creatinine, Ser: 0.34 mg/dL — ABNORMAL LOW (ref 0.44–1.00)
GLUCOSE: 133 mg/dL — AB (ref 65–99)
Potassium: 3.3 mmol/L — ABNORMAL LOW (ref 3.5–5.1)
SODIUM: 141 mmol/L (ref 135–145)
TOTAL PROTEIN: 5.1 g/dL — AB (ref 6.5–8.1)

## 2016-03-22 LAB — CBC
HCT: 31.3 % — ABNORMAL LOW (ref 35.0–47.0)
HEMOGLOBIN: 10.2 g/dL — AB (ref 12.0–16.0)
MCH: 30.1 pg (ref 26.0–34.0)
MCHC: 32.4 g/dL (ref 32.0–36.0)
MCV: 93 fL (ref 80.0–100.0)
Platelets: 191 10*3/uL (ref 150–440)
RBC: 3.37 MIL/uL — AB (ref 3.80–5.20)
RDW: 15.2 % — ABNORMAL HIGH (ref 11.5–14.5)
WBC: 6 10*3/uL (ref 3.6–11.0)

## 2016-03-22 LAB — PHOSPHORUS: Phosphorus: 2.3 mg/dL — ABNORMAL LOW (ref 2.5–4.6)

## 2016-03-22 LAB — GLUCOSE, CAPILLARY
GLUCOSE-CAPILLARY: 119 mg/dL — AB (ref 65–99)
GLUCOSE-CAPILLARY: 124 mg/dL — AB (ref 65–99)
GLUCOSE-CAPILLARY: 132 mg/dL — AB (ref 65–99)
Glucose-Capillary: 106 mg/dL — ABNORMAL HIGH (ref 65–99)

## 2016-03-22 MED ORDER — ALPRAZOLAM 0.25 MG PO TABS
0.2500 mg | ORAL_TABLET | Freq: Three times a day (TID) | ORAL | Status: DC | PRN
  Administered 2016-03-22 (×2): 0.25 mg via ORAL
  Filled 2016-03-22 (×2): qty 1

## 2016-03-22 MED ORDER — VORICONAZOLE 200 MG IV SOLR
290.0000 mg | Freq: Two times a day (BID) | INTRAVENOUS | Status: DC
  Filled 2016-03-22: qty 290

## 2016-03-22 MED ORDER — POTASSIUM CHLORIDE 20 MEQ PO PACK
40.0000 meq | PACK | Freq: Once | ORAL | Status: AC
  Administered 2016-03-22: 40 meq via ORAL
  Filled 2016-03-22: qty 2

## 2016-03-22 NOTE — Progress Notes (Signed)
[facility] Progess Note    ASSESSMENT/PLAN   ASSESSMENT / PLAN  70 yo obese whit female with acute on resp failure from acute COPD exacerbation from pneumonia with fungal pneumonia, failure to wean from vent s/p trach s/p PEG tube  PULMONARY A: Acute on chronic hypercapnic respiratory failure COPD with severe persistent obstruction Prolonged VDRF, Currently on vent at PRVC/14/550/PEEP of 5. FiO2 of 28%. The patient tolerated weaning trial at 5/5 for approximately 4 hours. Once again yesterday. OHS -Chest x-ray image from 5/10 reviewed, shows improved right lower lobe infiltrate/atelectasis when compared with previous image. P:  Cont full vent support . Weaning trial again today as tolerated. Cont vent bundle We'll continue pressure support wean, with goal of trach collar. Nebulized bronchodilator, given the patient's tracheal candidiasis,  budesonide was discontinued yesterday. S/p trach and peg Aspiration precautions  CARDIOVASCULAR A:  Shock, resolved Hypertension, now well controlled. P:  Continue Cozaar  RENAL A:  AKI - resolved P:  Monitor BMET intermittently Monitor I/Os Correct electrolytes as indicated Diurese prn  GASTROINTESTINAL A:  Elevated LFTs  Diarrhea-c-diff negative P:  SUP: enteral famotidine Continue tube feeds TFs and titrate gradually to goal.  Monitor LFTs intermittently S/p PEG tube   HEMATOLOGIC A:  ICU acquired anemia - no active bleeding P:  DVT px: LMWH Monitor CBC intermittently Transfuse per usual guidelines  INFECTIOUS A: Persistent fever-resolved.  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  Micro/culture results:  BCx2 4/27: Negative UC 4/29: Negative. 4/20: Candida krusei Sputum/Bronchoscopy  Antibiotics: levofloxacin 4/16 >> 4/20 meropenem  4/21 >> 5/2 Vancomycin 4/21 >> 5/2 Voriconazole 4/24 >>  ENDOCRINE A:  DM 2, adequately controlled. Hypothyroidism P:  Currently on Lantus 30 units every morning, plus sliding scale insulin. Will continue.  NEUROLOGIC A:  ICU associated discomfort Acute encephalopathy, which is now resolved. P:  RASS goal: -1, -2  The patient has been transitioned off fentanyl infusion, and has been adequately maintained on when necessary pushes.    Best Practices  DVT Prophylaxis: Lovenox 40 mg every 12. GI Prophylaxis: Famotidine 20 mg twice a day.   ---------------------------------------   ----------------------------------------   Name: Ebony [name] MRN: [id number] DOB: 02/02/1946    ADMISSION DATE:  02/27/2016     SUBJECTIVE:   Pt currently on the ventilator, can not provide history or review of systems.     VITAL SIGNS: Temp:  [98.1 F (36.7 C)-98.5 F (36.9 C)] 98.5 F (36.9 C) (05/09 1951) Pulse Rate:  [59-121] 70 (05/10 0700) Resp:  [13-24] 16 (05/10 0700) BP: (122-168)/(48-113) 140/75 mmHg (05/10 0700) SpO2:  [91 %-99 %] 96 % (05/10 0735) FiO2 (%):  [28 %] 28 % (05/10 0400) Weight:  [250 lb (113.4 kg)] 250 lb (113.4 kg) (05/10 0500) HEMODYNAMICS:   VENTILATOR SETTINGS: Vent Mode:  [-] PSV FiO2 (%):  [28 %] 28 % Set Rate:  [14 bmp] 14 bmp Vt Set:  [550 mL] 550 mL PEEP:  [5 cmH20] 5 cmH20 Pressure Support:  [5 cmH20] 5 cmH20 INTAKE / OUTPUT:  Intake/Output Summary (Last 24 hours) at 03/22/16 0801 Last data filed at 03/22/16 0200  Gross per 24 hour  Intake   1361 ml  Output   1725 ml  Net   -364 ml    PHYSICAL EXAMINATION: Physical Examination:   VS: BP 140/75 mmHg  Pulse 70  Temp(Src) 98.5 F (36.9 C) (Oral)  Resp  16  Ht 5\' 3"  (1.6 m)  Wt 250 lb (113.4 kg)  BMI 44.30 kg/m2  SpO2 96%  General Appearance: No distress . Patient is awake this morning, she is trying to talk. Neuro:without focal findings, mental status  normal. HEENT: PERRLA, EOM intact.Tracheostomy in place, site appears clean. Pulmonary: normal breath sounds  , Decreased bilaterally. CardiovascularNormal S1,S2.  No m/r/g.   Abdomen: Benign, Soft, non-tender. Renal:  No costovertebral tenderness  GU:  Not performed at this time. Endocrine: No evident thyromegaly. Skin:   warm, no rashes, no ecchymosis  Extremities: normal, no cyanosis, clubbing.   LABS:   LABORATORY PANEL:   CBC  Recent Labs Lab 03/22/16 0525  WBC 6.0  HGB 10.2*  HCT 31.3*  PLT 191    Chemistries   Recent Labs Lab 03/17/16 1116  03/22/16 0525  NA 142  < > 141  K 4.3  < > 3.3*  CL 96*  < > 104  CO2 39*  < > 32  GLUCOSE 111*  < > 133*  BUN 30*  < > 16  CREATININE 0.47  < > 0.34*  CALCIUM 9.4  < > 8.1*  MG 2.1  --   --   PHOS 3.2  --   --   AST  --   --  188*  ALT  --   --  350*  ALKPHOS  --   --  83  BILITOT  --   --  1.0  < > = values in this interval not displayed.   Recent Labs Lab 03/21/16 1116 03/21/16 1617 03/21/16 2006 03/22/16 0004 03/22/16 0412 03/22/16 0709  GLUCAP 134* 114* 172* 119* 132* 124*   No results for input(s): PHART, PCO2ART, PO2ART in the last 168 hours.  Recent Labs Lab 03/22/16 0525  AST 188*  ALT 350*  ALKPHOS 83  BILITOT 1.0  ALBUMIN 2.2*    Cardiac Enzymes No results for input(s): TROPONINI in the last 168 hours.  RADIOLOGY:  Dg Chest 1 View  03/22/2016  CLINICAL DATA:  Dyspnea. EXAM: CHEST 1 VIEW COMPARISON:  Mar 19, 2016. FINDINGS: Stable cardiomediastinal silhouette. Tracheostomy tube and right-sided PICC line are unchanged in position. No pneumothorax is noted. Mild bibasilar opacities are noted concerning for edema or atelectasis. Bony thorax is unremarkable. IMPRESSION: Stable support apparatus. Mild bibasilar opacities are noted concerning for subsegmental atelectasis or edema. Electronically Signed   By: Marijo Conception, M.D.   On: 03/22/2016 07:35       --Marda Stalker, MD.   ICU Pager: [phone] [facility] Office Number: [phone]  Patricia Pesa, M.D.  Vilinda Boehringer, M.D.  Merton Border, M.D  03/22/2016   Critical Care Attestation.  I have personally obtained a history, examined the patient, evaluated laboratory and imaging results, formulated the assessment and plan and placed orders. The Patient requires high complexity decision making for assessment and support, frequent evaluation and titration of therapies, application of advanced monitoring technologies and extensive interpretation of multiple databases. The patient has critical illness that could lead imminently to failure of 1 or more organ systems and requires the highest level of physician preparedness to intervene.  Critical Care Time devoted to patient care services described in this note is 35 minutes and is exclusive of time spent in procedures.

## 2016-03-22 NOTE — Care Management (Signed)
Bed available at [facility] with insurance approval received. Dr. Juanell Fairly notified. [facility] has left message with spouse. Primary nurse to attempt to reach him also. Faxed discharge summary to [phone]

## 2016-03-22 NOTE — Care Management (Signed)
Spoke with Seth Bake with [facility]. Still awaiting insurance authorization for [facility]. Weaning vent. Diarrhea controlled. C-Diff negative now. Peg with feedings. IV antibiotics. Following progression.

## 2016-03-22 NOTE — Discharge Summary (Signed)
Physician Discharge Summary  Patient ID: Ebony [name] MRN: [id number] DOB/AGE: [birth year] 68 y.o.  Admit date: 02/27/2016 Discharge date: 03/22/2016    Discharge Diagnoses:  Acute on chronic hypercapnic respiratory failure COPD with severe persistent obstruction Prolonged VDRF, Currently on vent at PRVC/14/550/PEEP of 5. FiO2 of 28% Hypertension AKI - resolved Elevated LFTs  Persistent fever-resolved.  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 Hypothyroidism      DISCHARGE PLAN BY DIAGNOSIS    ASSESSMENT / PLAN  70 yo obese whit female with acute on resp failure from acute COPD exacerbation from pneumonia with fungal pneumonia, failure to wean from vent s/p trach s/p PEG tube  PULMONARY A: Acute on chronic hypercapnic respiratory failure COPD with severe persistent obstruction Prolonged VDRF, Currently on vent at PRVC/14/550/PEEP of 5. FiO2 of 28%. The patient tolerated weaning trial at 5/5 for approximately 4 hours. Once again yesterday. OHS -Chest x-ray image from 5/10 reviewed, shows improved right lower lobe infiltrate/atelectasis when compared with previous image. P:  Cont full vent support . Weaning trial again today as tolerated. Cont vent bundle We'll continue pressure support wean, with goal of trach collar. Nebulized bronchodilator, given the patient's tracheal candidiasis, budesonide was discontinued yesterday. S/p trach and peg Aspiration precautions  CARDIOVASCULAR A:  Shock, resolved Hypertension, now well controlled. P:  Continue Cozaar  RENAL A:  AKI - resolved P:  Monitor BMET intermittently Monitor I/Os Correct electrolytes as indicated Diurese prn  GASTROINTESTINAL A:  Elevated LFTs  Diarrhea-c-diff negative P:  SUP: enteral famotidine Continue tube feeds TFs and titrate gradually to goal.  Monitor  LFTs intermittently S/p PEG tube   HEMATOLOGIC A:  ICU acquired anemia - no active bleeding P:  DVT px: LMWH Monitor CBC intermittently Transfuse per usual guidelines  INFECTIOUS A: Persistent fever-resolved.  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  Micro/culture results:  BCx2 4/27: Negative UC 4/29: Negative. 4/20: Candida krusei Sputum/Bronchoscopy  Antibiotics: levofloxacin 4/16 >> 4/20 meropenem 4/21 >> 5/2 Vancomycin 4/21 >> 5/2 Voriconazole 4/24 >>  ENDOCRINE A:  DM 2, adequately controlled. Hypothyroidism P:  Currently on Lantus 30 units every morning, plus sliding scale insulin. Will continue.  NEUROLOGIC A:  ICU associated discomfort Acute encephalopathy, which is now resolved. P:  RASS goal: -1, -2  The patient has been transitioned off fentanyl infusion, and has been adequately maintained on when necessary pushes.                          DISCHARGE SUMMARY   Ebony [name] is a 70 y.o. y/o female with a PMH of COPD, slep apnea, hyperlipidemia, hypothyroidism, depression, morbid obesity, depression who presented to [facility] on 4/16 with increased shortness of breath. She was placed on BiPAP and transitioned to high flow nasal cannula, but the patient continued to be short of breath and tightness. [facility] M team was consulted on 4/17 patient was in acute respiratory distress and was intubated on 4/18. Patient was unable to be weaned from  the ventilator despite several attempts. Patient had multiple organ injury and her physical status continued to deteriorate. Patient was on multiple pressors at one time but finally was weaned off all pressors and was trached and PEG tube was placed on 03/14/16 . Patient has made some significant improvements but requires LTAC care.     SIGNIFICANT  DIAGNOSTIC STUDIES   SIGNIFICANT EVENTS  4/24 CT head > negative  4/26 CT abdomen pelvis concerning for 1. The appearance of the lungs is compatible with severe multilobar bronchopneumonia.2. Trace right pleural effusion.3. Trace volume of ascites and mild diffuse mesenteric edema. 4. Atherosclerosis, including left anterior descending coronaryartery disease. Please note that although the presence of coronaryartery calcium documents the presence of coronary artery disease,the severity of this disease and any potential stenosis cannot beassessed on this non-gated CT examination. Assessment for potential risk factor modification, dietary therapy or pharmacologic therapymay be warranted, if clinically indicated. 5. Colonic diverticulosis without definite findings to suggest anacute diverticulitis at this time.6.    MICRODATA 4/25 BAL positive for Aspergillus fumigatUS>>  4/27 BC >>no growth  4/29 UC >> N GTD  4/20 urine culture positive for CANDIDA KRUSEI (A)  ANTIBIOTICS 4/16 levaquin>>4/20 4/20 Zosyn >>4/21 Meropenem 4/21>> Vanc 4/21>> Voriconazole 4/24  CONSULTS 4/17 critical care>> 4/24 infectious disease>> 4/25 cardiology>> 5/1 general surgery>> 5/1 gastroenterology  TUBES / LINES 5/1 trach>> 5/1 peg>> 4/18 ET tube > 5/1 >> Discharge Exam: General Appearance: No distress . Patient is awake this morning, she is trying to talk. Neuro:without focal findings, mental status normal. HEENT: PERRLA, EOM intact.Tracheostomy in place, site appears clean. Pulmonary: normal breath sounds , Decreased bilaterally. CardiovascularNormal S1,S2. No m/r/g.  Abdomen: Benign, Soft, non-tender. Renal: No costovertebral tenderness  GU: Not performed at this time. Endocrine: No evident thyromegaly. Skin: warm, no rashes, no ecchymosis  Extremities: normal, no cyanosis, clubbing.   Filed Vitals:   03/22/16 0855 03/22/16 0900 03/22/16 1000 03/22/16 1100  BP:  141/66 133/67  133/82  Pulse:  74 68 66  Temp:      TempSrc:      Resp:  19 14 15   Height:      Weight:      SpO2: 97% 97% 97% 98%     Discharge Labs  BMET  Recent Labs Lab 03/17/16 1116 03/18/16 0441 03/20/16 0441 03/21/16 0450 03/22/16 0525  NA 142 138 139 138 141  K 4.3 4.4 4.4 3.8 3.3*  CL 96* 91* 95* 95* 104  CO2 39* 41* 37* 35* 32  GLUCOSE 111* 139* 126* 136* 133*  BUN 30* 30* 22* 21* 16  CREATININE 0.47 0.41* 0.33* 0.44 0.34*  CALCIUM 9.4 9.5 9.0 9.0 8.1*  MG 2.1  --   --   --   --   PHOS 3.2  --   --   --  2.3*    CBC  Recent Labs Lab 03/20/16 0441 03/21/16 0450 03/22/16 0525  HGB 10.5* 10.7* 10.2*  HCT 32.4* 33.3* 31.3*  WBC 6.0 7.1 6.0  PLT 206 212 191    Anti-Coagulation No results for input(s): INR in the last 168 hours.          Medication List    ASK your doctor about these medications        aspirin EC 81 MG tablet  Take 81 mg by mouth every morning.     buPROPion 150 MG 24 hr tablet  Commonly known as:  WELLBUTRIN XL  Take 150 mg by mouth every morning.  citalopram 40 MG tablet  Commonly known as:  CELEXA  Take 40 mg by mouth every morning.     clobetasol 0.05 % external solution  Commonly known as:  TEMOVATE  Apply 1 application topically daily as needed (for psoriasis.).     doxycycline 100 MG capsule  Commonly known as:  VIBRAMYCIN  Take 1 capsule (100 mg total) by mouth 2 (two) times daily.     furosemide 20 MG tablet  Commonly known as:  LASIX  Take 25m by mouth once a day.     glipiZIDE 5 MG tablet  Commonly known as:  GLUCOTROL  Take 1 tablet by mouth daily before lunch.     HUMALOG KWIKPEN 100 UNIT/ML KiwkPen  Generic drug:  insulin lispro  Inject 38-48 Units into the skin 3 (three) times daily.     HUMIRA PEN-PSORIASIS STARTER 40 MG/0.8ML Pnkt  Generic drug:  Adalimumab  Inject 40 mg into the skin every 14 (fourteen) days.     INVOKANA 100 MG Tabs tablet  Generic drug:  canagliflozin  Take 100 mg by mouth  daily.     letrozole 2.5 MG tablet  Commonly known as:  FEMARA  Take 1 tablet (2.5 mg total) by mouth daily.     LEVEMIR 100 UNIT/ML injection  Generic drug:  insulin detemir  Inject 85 units subcutaneously twice a day     levofloxacin 500 MG tablet  Commonly known as:  LEVAQUIN  Take 1 tablet (500 mg total) by mouth daily.     levothyroxine 200 MCG tablet  Commonly known as:  SYNTHROID, LEVOTHROID  Take 200 mcg by mouth daily before breakfast. *Take along with 50 mcg to equal 250 mcg total dose*     levothyroxine 50 MCG tablet  Commonly known as:  SYNTHROID, LEVOTHROID  Take 50 mcg by mouth daily before breakfast. *Take along with 200 mcg tablet to equal 250 mcg total dose.*     losartan-hydrochlorothiazide 100-25 MG tablet  Commonly known as:  HYZAAR  Take 1 tablet by mouth daily. In am.     metFORMIN 500 MG tablet  Commonly known as:  GLUCOPHAGE  Take 1,000 mg by mouth 2 (two) times daily with a meal.     potassium chloride SA 20 MEQ tablet  Commonly known as:  K-DUR,KLOR-CON  Take 2 tablets (40 mEq total) by mouth 2 (two) times daily.     predniSONE 20 MG tablet  Commonly known as:  DELTASONE  Take 2 tablets (40 mg total) by mouth daily.     albuterol (2.5 MG/3ML) 0.083% nebulizer solution  Commonly known as:  PROVENTIL  Inhale 1 vial using nebulizer every six hours as needed for shortness of breath and/or wheezing.     PROAIR HFA 108 (90 Base) MCG/ACT inhaler  Generic drug:  albuterol  INHALE 2 PUFFS BY MOUTH EVERY 4 HOURS AS NEEDED FOR SHORTNESS OF BREATH AND/OR WHEEZING.     simvastatin 40 MG tablet  Commonly known as:  ZOCOR  Take 40 mg by mouth at bedtime.     SYMBICORT 160-4.5 MCG/ACT inhaler  Generic drug:  budesonide-formoterol  INHALE 2 PUFFS BY MOUTH TWICE DAILY     tiotropium 18 MCG inhalation capsule  Commonly known as:  SPIRIVA  Place 18 mcg into inhaler and inhale daily.         Disposition: LTAC   Discharged Condition: JShalena Ezzell Berndt has met maximum benefit of inpatient care and is medically stable and cleared for discharge.  Time spent on disposition:  Greater than 45 minutes.   [facility]

## 2016-03-22 NOTE — Progress Notes (Signed)
Pt refused trach care stating she wanted to sleep longer before trach care.

## 2016-03-23 LAB — CULTURE, RESPIRATORY W GRAM STAIN

## 2016-03-23 LAB — CULTURE, RESPIRATORY

## 2016-03-26 LAB — CULTURE, FUNGUS WITHOUT SMEAR

## 2016-04-06 LAB — ACID FAST SMEAR (AFB): ACID FAST SMEAR - AFSCU2: NEGATIVE

## 2016-04-26 LAB — ACID FAST CULTURE WITH REFLEXED SENSITIVITIES: ACID FAST CULTURE - AFSCU3: NEGATIVE

## 2016-05-09 ENCOUNTER — Inpatient Hospital Stay: Payer: Medicare Other | Admitting: Oncology

## 2016-05-23 ENCOUNTER — Inpatient Hospital Stay: Payer: Medicare Other | Admitting: Oncology

## 2016-06-07 ENCOUNTER — Emergency Department: Payer: Medicare Other

## 2016-06-07 ENCOUNTER — Inpatient Hospital Stay
Admission: EM | Admit: 2016-06-07 | Discharge: 2016-06-26 | DRG: 853 | Disposition: A | Discharge status: Skilled Nursing Facility | Payer: Medicare Other | Attending: Specialist | Admitting: Specialist

## 2016-06-07 ENCOUNTER — Encounter: Payer: Self-pay | Admitting: Emergency Medicine

## 2016-06-07 DIAGNOSIS — E8770 Fluid overload, unspecified: Secondary | ICD-10-CM | POA: Diagnosis present

## 2016-06-07 DIAGNOSIS — L89150 Pressure ulcer of sacral region, unstageable: Secondary | ICD-10-CM | POA: Diagnosis present

## 2016-06-07 DIAGNOSIS — F329 Major depressive disorder, single episode, unspecified: Secondary | ICD-10-CM | POA: Diagnosis present

## 2016-06-07 DIAGNOSIS — A419 Sepsis, unspecified organism: Secondary | ICD-10-CM

## 2016-06-07 DIAGNOSIS — N390 Urinary tract infection, site not specified: Secondary | ICD-10-CM | POA: Diagnosis present

## 2016-06-07 DIAGNOSIS — E039 Hypothyroidism, unspecified: Secondary | ICD-10-CM | POA: Diagnosis present

## 2016-06-07 DIAGNOSIS — Z91013 Allergy to seafood: Secondary | ICD-10-CM

## 2016-06-07 DIAGNOSIS — N179 Acute kidney failure, unspecified: Secondary | ICD-10-CM | POA: Diagnosis present

## 2016-06-07 DIAGNOSIS — D649 Anemia, unspecified: Secondary | ICD-10-CM | POA: Diagnosis present

## 2016-06-07 DIAGNOSIS — B9629 Other Escherichia coli [E. coli] as the cause of diseases classified elsewhere: Secondary | ICD-10-CM | POA: Diagnosis present

## 2016-06-07 DIAGNOSIS — Z794 Long term (current) use of insulin: Secondary | ICD-10-CM

## 2016-06-07 DIAGNOSIS — E860 Dehydration: Secondary | ICD-10-CM | POA: Diagnosis present

## 2016-06-07 DIAGNOSIS — Z9981 Dependence on supplemental oxygen: Secondary | ICD-10-CM

## 2016-06-07 DIAGNOSIS — Z853 Personal history of malignant neoplasm of breast: Secondary | ICD-10-CM

## 2016-06-07 DIAGNOSIS — M810 Age-related osteoporosis without current pathological fracture: Secondary | ICD-10-CM | POA: Diagnosis present

## 2016-06-07 DIAGNOSIS — Z79899 Other long term (current) drug therapy: Secondary | ICD-10-CM

## 2016-06-07 DIAGNOSIS — J9600 Acute respiratory failure, unspecified whether with hypoxia or hypercapnia: Secondary | ICD-10-CM

## 2016-06-07 DIAGNOSIS — J9690 Respiratory failure, unspecified, unspecified whether with hypoxia or hypercapnia: Secondary | ICD-10-CM

## 2016-06-07 DIAGNOSIS — Z833 Family history of diabetes mellitus: Secondary | ICD-10-CM

## 2016-06-07 DIAGNOSIS — B961 Klebsiella pneumoniae [K. pneumoniae] as the cause of diseases classified elsewhere: Secondary | ICD-10-CM | POA: Diagnosis present

## 2016-06-07 DIAGNOSIS — Z1612 Extended spectrum beta lactamase (ESBL) resistance: Secondary | ICD-10-CM | POA: Diagnosis present

## 2016-06-07 DIAGNOSIS — Z8249 Family history of ischemic heart disease and other diseases of the circulatory system: Secondary | ICD-10-CM

## 2016-06-07 DIAGNOSIS — Z931 Gastrostomy status: Secondary | ICD-10-CM

## 2016-06-07 DIAGNOSIS — A4152 Sepsis due to Pseudomonas: Secondary | ICD-10-CM | POA: Diagnosis not present

## 2016-06-07 DIAGNOSIS — G9340 Encephalopathy, unspecified: Secondary | ICD-10-CM | POA: Diagnosis not present

## 2016-06-07 DIAGNOSIS — E870 Hyperosmolality and hypernatremia: Secondary | ICD-10-CM | POA: Diagnosis not present

## 2016-06-07 DIAGNOSIS — J440 Chronic obstructive pulmonary disease with acute lower respiratory infection: Secondary | ICD-10-CM | POA: Diagnosis present

## 2016-06-07 DIAGNOSIS — Z7951 Long term (current) use of inhaled steroids: Secondary | ICD-10-CM

## 2016-06-07 DIAGNOSIS — E785 Hyperlipidemia, unspecified: Secondary | ICD-10-CM | POA: Diagnosis present

## 2016-06-07 DIAGNOSIS — E876 Hypokalemia: Secondary | ICD-10-CM | POA: Diagnosis not present

## 2016-06-07 DIAGNOSIS — K529 Noninfective gastroenteritis and colitis, unspecified: Secondary | ICD-10-CM | POA: Diagnosis present

## 2016-06-07 DIAGNOSIS — J151 Pneumonia due to Pseudomonas: Secondary | ICD-10-CM | POA: Diagnosis present

## 2016-06-07 DIAGNOSIS — J189 Pneumonia, unspecified organism: Secondary | ICD-10-CM | POA: Diagnosis not present

## 2016-06-07 DIAGNOSIS — Z825 Family history of asthma and other chronic lower respiratory diseases: Secondary | ICD-10-CM

## 2016-06-07 DIAGNOSIS — L89153 Pressure ulcer of sacral region, stage 3: Secondary | ICD-10-CM | POA: Diagnosis present

## 2016-06-07 DIAGNOSIS — E46 Unspecified protein-calorie malnutrition: Secondary | ICD-10-CM | POA: Diagnosis present

## 2016-06-07 DIAGNOSIS — K5900 Constipation, unspecified: Secondary | ICD-10-CM | POA: Diagnosis present

## 2016-06-07 DIAGNOSIS — J9622 Acute and chronic respiratory failure with hypercapnia: Secondary | ICD-10-CM | POA: Diagnosis not present

## 2016-06-07 DIAGNOSIS — K5792 Diverticulitis of intestine, part unspecified, without perforation or abscess without bleeding: Secondary | ICD-10-CM | POA: Diagnosis present

## 2016-06-07 DIAGNOSIS — Z7982 Long term (current) use of aspirin: Secondary | ICD-10-CM

## 2016-06-07 DIAGNOSIS — R600 Localized edema: Secondary | ICD-10-CM

## 2016-06-07 DIAGNOSIS — R0600 Dyspnea, unspecified: Secondary | ICD-10-CM

## 2016-06-07 DIAGNOSIS — Z923 Personal history of irradiation: Secondary | ICD-10-CM

## 2016-06-07 DIAGNOSIS — Z978 Presence of other specified devices: Secondary | ICD-10-CM

## 2016-06-07 DIAGNOSIS — M069 Rheumatoid arthritis, unspecified: Secondary | ICD-10-CM | POA: Diagnosis present

## 2016-06-07 DIAGNOSIS — E1165 Type 2 diabetes mellitus with hyperglycemia: Secondary | ICD-10-CM | POA: Diagnosis present

## 2016-06-07 DIAGNOSIS — Z95828 Presence of other vascular implants and grafts: Secondary | ICD-10-CM

## 2016-06-07 DIAGNOSIS — Z6836 Body mass index (BMI) 36.0-36.9, adult: Secondary | ICD-10-CM

## 2016-06-07 DIAGNOSIS — G4733 Obstructive sleep apnea (adult) (pediatric): Secondary | ICD-10-CM | POA: Diagnosis present

## 2016-06-07 DIAGNOSIS — Z452 Encounter for adjustment and management of vascular access device: Secondary | ICD-10-CM

## 2016-06-07 DIAGNOSIS — Z87891 Personal history of nicotine dependence: Secondary | ICD-10-CM

## 2016-06-07 DIAGNOSIS — R1310 Dysphagia, unspecified: Secondary | ICD-10-CM | POA: Diagnosis present

## 2016-06-07 DIAGNOSIS — J209 Acute bronchitis, unspecified: Secondary | ICD-10-CM | POA: Diagnosis not present

## 2016-06-07 DIAGNOSIS — I10 Essential (primary) hypertension: Secondary | ICD-10-CM | POA: Diagnosis present

## 2016-06-07 DIAGNOSIS — I959 Hypotension, unspecified: Secondary | ICD-10-CM | POA: Diagnosis present

## 2016-06-07 LAB — COMPREHENSIVE METABOLIC PANEL
ALBUMIN: 1.6 g/dL — AB (ref 3.5–5.0)
ALK PHOS: 72 U/L (ref 38–126)
ALT: 33 U/L (ref 14–54)
AST: 25 U/L (ref 15–41)
Anion gap: 11 (ref 5–15)
BILIRUBIN TOTAL: 0.6 mg/dL (ref 0.3–1.2)
BUN: 96 mg/dL — AB (ref 6–20)
CALCIUM: 8 mg/dL — AB (ref 8.9–10.3)
CO2: 26 mmol/L (ref 22–32)
CREATININE: 2.6 mg/dL — AB (ref 0.44–1.00)
Chloride: 99 mmol/L — ABNORMAL LOW (ref 101–111)
GFR calc Af Amer: 21 mL/min — ABNORMAL LOW (ref >60)
GFR calc non Af Amer: 18 mL/min — ABNORMAL LOW (ref >60)
GLUCOSE: 184 mg/dL — AB (ref 65–99)
Potassium: 5 mmol/L (ref 3.5–5.1)
SODIUM: 136 mmol/L (ref 135–145)
TOTAL PROTEIN: 4.5 g/dL — AB (ref 6.5–8.1)

## 2016-06-07 LAB — CBC WITH DIFFERENTIAL/PLATELET
BASOS ABS: 0 10*3/uL (ref 0–0.1)
BASOS PCT: 0 %
Eosinophils Absolute: 0 10*3/uL (ref 0–0.7)
Eosinophils Relative: 0 %
HEMATOCRIT: 39.2 % (ref 35.0–47.0)
HEMOGLOBIN: 12.7 g/dL (ref 12.0–16.0)
LYMPHS PCT: 12 %
Lymphs Abs: 0.9 10*3/uL — ABNORMAL LOW (ref 1.0–3.6)
MCH: 30.3 pg (ref 26.0–34.0)
MCHC: 32.5 g/dL (ref 32.0–36.0)
MCV: 93.3 fL (ref 80.0–100.0)
MONO ABS: 0.1 10*3/uL — AB (ref 0.2–0.9)
Monocytes Relative: 2 %
NEUTROS ABS: 6.7 10*3/uL — AB (ref 1.4–6.5)
NEUTROS PCT: 86 %
Platelets: 182 10*3/uL (ref 150–440)
RBC: 4.2 MIL/uL (ref 3.80–5.20)
RDW: 16 % — ABNORMAL HIGH (ref 11.5–14.5)
WBC: 7.8 10*3/uL (ref 3.6–11.0)

## 2016-06-07 LAB — LACTIC ACID, PLASMA: LACTIC ACID, VENOUS: 1 mmol/L (ref 0.5–1.9)

## 2016-06-07 LAB — GLUCOSE, CAPILLARY
GLUCOSE-CAPILLARY: 64 mg/dL — AB (ref 65–99)
Glucose-Capillary: 181 mg/dL — ABNORMAL HIGH (ref 65–99)

## 2016-06-07 MED ORDER — VANCOMYCIN HCL IN DEXTROSE 1-5 GM/200ML-% IV SOLN
1000.0000 mg | Freq: Once | INTRAVENOUS | Status: AC
  Administered 2016-06-07: 1000 mg via INTRAVENOUS
  Filled 2016-06-07: qty 200

## 2016-06-07 MED ORDER — PIPERACILLIN-TAZOBACTAM 3.375 G IVPB 30 MIN
3.3750 g | Freq: Once | INTRAVENOUS | Status: AC
  Administered 2016-06-07: 3.375 g via INTRAVENOUS
  Filled 2016-06-07: qty 50

## 2016-06-07 MED ORDER — SODIUM CHLORIDE 0.9 % IV BOLUS (SEPSIS)
1000.0000 mL | Freq: Once | INTRAVENOUS | Status: AC
  Administered 2016-06-07: 1000 mL via INTRAVENOUS

## 2016-06-07 MED ORDER — DIATRIZOATE MEGLUMINE & SODIUM 66-10 % PO SOLN: 15.0000 mL | Freq: Once | ORAL | Status: DC

## 2016-06-07 MED ORDER — SODIUM CHLORIDE 0.9 % IV BOLUS (SEPSIS)
30.0000 mL/kg | Freq: Once | INTRAVENOUS | Status: AC
  Administered 2016-06-07: 2520 mL via INTRAVENOUS

## 2016-06-07 MED ORDER — DEXTROSE 50 % IV SOLN
1.0000 | Freq: Once | INTRAVENOUS | Status: AC
  Administered 2016-06-07: 50 mL via INTRAVENOUS
  Filled 2016-06-07: qty 50

## 2016-06-07 NOTE — ED Notes (Signed)
MD notified of continual drop in BP after pressure bags and trendelenburg position.

## 2016-06-07 NOTE — ED Notes (Signed)
MD at bedside. 

## 2016-06-07 NOTE — ED Notes (Signed)
Md notified of patient's BP dropping.  Patient placed in trendelenburg and placed on pressure bags for fluids.

## 2016-06-07 NOTE — ED Triage Notes (Signed)
  Pt comes into the ED via EMS from home c/o constipation and decreased urine output.  Patient as released from [facility] last week and has been at home with home health.  Home health nurse explained that she has had a lot of "loose stool" but nothing is actually coming out.  Patient aler and oriented x4.  H/o COPD, feeding tube, and has foley placed.  VS stable, CBG 75, patient in NAD at this time with even and unlabored respirations.

## 2016-06-07 NOTE — ED Provider Notes (Signed)
[facility] Emergency Department Provider Note   ____________________________________________   I have reviewed the triage vital signs and the nursing notes.   HISTORY  Chief Complaint Constipation and Other (decreased urine output)   Most history obtained from family   HPI Ebony [name] is a 69 y.o. female with history of COPD, who presents to the emergency department today because of increased weakness, concerns for constipation and decreased urination. Family states that the patient has been getting progressively worse over the last few days. The patient is seen by hospice due to the COPD. Yesterday when hospice came out the patient was complaining of urinary retention so she underwent a straight cath. Today the patient was still complaining of urinary retention so they placed a foley catheter. Family states that she has continued to get weaker. In addition they do have concern for constipation stating it has been 8 days since she last had a bowel movement. They have been trying enemas and laxatives. They have not appreciated any fevers. Some increased wheezing which family thinks is due to the patient's inability to effectively use her inhaler.     Past Medical History:  Diagnosis Date  . Asthma   . Breast cancer ([place])   . Carpal tunnel syndrome   . COPD (chronic obstructive pulmonary disease) ([place])   . Depression   . Diabetes mellitus type 2, uncomplicated ([place])   . Hyperlipidemia   . Hypertension   . Hypothyroidism   . Osteoporosis, post-menopausal   . Psoriasis   . Sleep apnea   . Vitamin D deficiency     Patient Active Problem List   Diagnosis Date Noted  . Pressure ulcer 03/18/2016  . Problems with swallowing and mastication   . COPD with exacerbation ([place])   . Mild malnutrition ([place])   . History of infection not responsive to antibiotic therapy   . Acute respiratory failure ([place]) 02/27/2016  . Aftercare following surgery   . Acute  respiratory failure with hypoxia ([place]) 10/07/2015  . COPD exacerbation ([place]) 10/07/2015  . Community acquired pneumonia 10/07/2015  . Hyperkalemia 10/07/2015  . Breast cancer, stage 1 ([place])   . Cancer of left female breast ([place]) 09/09/2015  . Mild chronic obstructive pulmonary disease ([place]) 06/25/2014  . Inflamed nasal mucosa 06/25/2014  . Apnea, sleep 06/25/2014    Past Surgical History:  Procedure Laterality Date  . BREAST EXCISIONAL BIOPSY Left 10/06/2015   np for surgery lumpectomy  . BREAST LUMPECTOMY WITH NEEDLE LOCALIZATION Left 10/06/2015   Procedure: BREAST LUMPECTOMY WITH NEEDLE LOCALIZATION;  Surgeon: Hubbard Robinson, MD;  Location: [facility] ORS;  Service: General;  Laterality: Left;  . carpal tunnel Right 1995  . Right heel spur removed    . SENTINEL NODE BIOPSY Left 10/06/2015   Procedure: SENTINEL NODE BIOPSY;  Surgeon: Hubbard Robinson, MD;  Location: [facility] ORS;  Service: General;  Laterality: Left;  . THUMB ARTHROSCOPY Left 2000   release of a nerve to the thumb.  . TRACHEOSTOMY TUBE PLACEMENT N/A 03/14/2016   Procedure: TRACHEOSTOMY;  Surgeon: Carloyn Manner, MD;  Location: [facility] ORS;  Service: ENT;  Laterality: N/A;  . TUBAL LIGATION      Prior to Admission medications   Medication Sig Start Date End Date Taking? Authorizing Provider  Adalimumab (HUMIRA PEN-PSORIASIS STARTER) 40 MG/0.8ML PNKT Inject 40 mg into the skin every 14 (fourteen) days.     Historical Provider, MD  albuterol (PROAIR HFA) 108 (90 BASE) MCG/ACT inhaler INHALE 2 PUFFS BY  MOUTH EVERY 4 HOURS AS NEEDED FOR SHORTNESS OF BREATH AND/OR WHEEZING. 09/28/14   Historical Provider, MD  albuterol (PROVENTIL) (2.5 MG/3ML) 0.083% nebulizer solution Inhale 1 vial using nebulizer every six hours as needed for shortness of breath and/or wheezing.    Historical Provider, MD  aspirin EC 81 MG tablet Take 81 mg by mouth every morning.     Historical Provider, MD  budesonide-formoterol (SYMBICORT) 160-4.5 MCG/ACT  inhaler INHALE 2 PUFFS BY MOUTH TWICE DAILY 09/15/14   Historical Provider, MD  buPROPion (WELLBUTRIN XL) 150 MG 24 hr tablet Take 150 mg by mouth every morning.     Historical Provider, MD  citalopram (CELEXA) 40 MG tablet Take 40 mg by mouth every morning.    Historical Provider, MD  clobetasol (TEMOVATE) 0.05 % external solution Apply 1 application topically daily as needed (for psoriasis.).  02/15/16   Historical Provider, MD  doxycycline (VIBRAMYCIN) 100 MG capsule Take 1 capsule (100 mg total) by mouth 2 (two) times daily. 02/15/16   Carrie Mew, MD  furosemide (LASIX) 20 MG tablet Take 40mg  by mouth once a day.    Historical Provider, MD  glipiZIDE (GLUCOTROL) 5 MG tablet Take 1 tablet by mouth daily before lunch.  08/14/15   Historical Provider, MD  insulin detemir (LEVEMIR) 100 UNIT/ML injection Inject 85 units subcutaneously twice a day    Historical Provider, MD  insulin lispro (HUMALOG KWIKPEN) 100 UNIT/ML KiwkPen Inject 38-48 Units into the skin 3 (three) times daily.    Historical Provider, MD  INVOKANA 100 MG TABS tablet Take 100 mg by mouth daily.  01/31/16   Historical Provider, MD  letrozole (FEMARA) 2.5 MG tablet Take 1 tablet (2.5 mg total) by mouth daily. 01/14/16   Lloyd Huger, MD  levofloxacin (LEVAQUIN) 500 MG tablet Take 1 tablet (500 mg total) by mouth daily. 10/12/15   Bettey Costa, MD  levothyroxine (SYNTHROID, LEVOTHROID) 200 MCG tablet Take 200 mcg by mouth daily before breakfast. *Take along with 50 mcg to equal 250 mcg total dose*    Historical Provider, MD  levothyroxine (SYNTHROID, LEVOTHROID) 50 MCG tablet Take 50 mcg by mouth daily before breakfast. *Take along with 200 mcg tablet to equal 250 mcg total dose.*    Historical Provider, MD  losartan-hydrochlorothiazide (HYZAAR) 100-25 MG tablet Take 1 tablet by mouth daily. In am.    Historical Provider, MD  metFORMIN (GLUCOPHAGE) 500 MG tablet Take 1,000 mg by mouth 2 (two) times daily with a meal.    Historical  Provider, MD  potassium chloride SA (K-DUR,KLOR-CON) 20 MEQ tablet Take 2 tablets (40 mEq total) by mouth 2 (two) times daily. 10/01/15   Hubbard Robinson, MD  predniSONE (DELTASONE) 20 MG tablet Take 2 tablets (40 mg total) by mouth daily. 02/15/16   Carrie Mew, MD  simvastatin (ZOCOR) 40 MG tablet Take 40 mg by mouth at bedtime.     Historical Provider, MD  tiotropium (SPIRIVA) 18 MCG inhalation capsule Place 18 mcg into inhaler and inhale daily.    Historical Provider, MD    Allergies Other and Shellfish allergy  Family History  Problem Relation Age of Onset  . Diabetes Mother   . Thyroid disease Mother   . COPD Father   . Emphysema Father   . Heart disease Brother   . Diabetes Brother   . Heart disease Brother   . Thyroid disease Daughter   . Thyroid disease Son     Social History Social History  Substance Use Topics  .  Smoking status: Former Smoker    Packs/day: 0.50    Years: 30.00    Types: Cigarettes    Quit date: 10/05/2015  . Smokeless tobacco: Never Used  . Alcohol use No    Review of Systems  Constitutional: Negative for fever. Cardiovascular: Negative for chest pain. Respiratory: Negative for shortness of breath. Gastrointestinal: Negative for abdominal pain, vomiting and diarrhea. Genitourinary: Difficulty with urination. Neurological: Negative for headaches, focal weakness or numbness.  10-point ROS otherwise negative.  ____________________________________________   PHYSICAL EXAM:  VITAL SIGNS: ED Triage Vitals [06/07/16 2048]  Enc Vitals Group     BP (!) 135/101     Pulse Rate (!) 124     Resp 20     Temp 97.8 F (36.6 C)     Temp Source Oral     SpO2 98 %     Weight 185 lb 1.6 oz (84 kg)     Height 5\' 2"  (1.575 m)   Constitutional: Alert and oriented. Appears ill.  Eyes: Conjunctivae are normal. PERRL. Normal extraocular movements. ENT   Head: Normocephalic and atraumatic.   Nose: No congestion/rhinnorhea.    Mouth/Throat: Mucous membranes are moist.   Neck: No stridor. Hematological/Lymphatic/Immunilogical: No cervical lymphadenopathy. Cardiovascular: Tachycardic, normal rhythm.  Respiratory: Slightly increased respiratory effort with bilateral wheezing. Gastrointestinal: Soft and tender to palpitation in the left lower quadrant.  Genitourinary: Deferred Musculoskeletal: Normal range of motion in all extremities. No joint effusions.  No lower extremity tenderness nor edema. Neurologic:  Normal speech and language. No gross focal neurologic deficits are appreciated.  Skin:  Skin is warm, dry and intact. No rash noted. Psychiatric: Mood and affect are normal. Speech and behavior are normal. Patient exhibits appropriate insight and judgment.  ____________________________________________    LABS (pertinent positives/negatives)  Labs Reviewed  GLUCOSE, CAPILLARY - Abnormal; Notable for the following:       Result Value   Glucose-Capillary 64 (*)    All other components within normal limits  CBC WITH DIFFERENTIAL/PLATELET - Abnormal; Notable for the following:    RDW 16.0 (*)    Neutro Abs 6.7 (*)    Lymphs Abs 0.9 (*)    Monocytes Absolute 0.1 (*)    All other components within normal limits  GLUCOSE, CAPILLARY - Abnormal; Notable for the following:    Glucose-Capillary 181 (*)    All other components within normal limits  COMPREHENSIVE METABOLIC PANEL - Abnormal; Notable for the following:    Chloride 99 (*)    Glucose, Bld 184 (*)    BUN 96 (*)    Creatinine, Ser 2.60 (*)    Calcium 8.0 (*)    Total Protein 4.5 (*)    Albumin 1.6 (*)    GFR calc non Af Amer 18 (*)    GFR calc Af Amer 21 (*)    All other components within normal limits  CULTURE, BLOOD (ROUTINE X 2)  CULTURE, BLOOD (ROUTINE X 2)  URINE CULTURE  CULTURE, BLOOD (ROUTINE X 2)  CULTURE, BLOOD (ROUTINE X 2)  LACTIC ACID, PLASMA  HCG, QUANTITATIVE, PREGNANCY  LACTIC ACID, PLASMA  URINALYSIS COMPLETEWITH  MICROSCOPIC ([facility] ONLY)  TROPONIN I     ____________________________________________   EKG  I, Nance Pear, attending physician, personally viewed and interpreted this EKG  EKG Time: 2053 Rate: 123 Rhythm: sinus tachycardia Axis: normal Intervals: qtc 432 QRS: narrow ST changes: no st elevation Impression: abnormal ekg   ____________________________________________    RADIOLOGY  CXR IMPRESSION: Left basilar infiltrate.  ____________________________________________   PROCEDURES  .Critical Care Performed by: Nance Pear Authorized by: Nance Pear   Critical care provider statement:    Critical care time (minutes):  35   Critical care time was exclusive of:  Separately billable procedures and treating other patients   Critical care was necessary to treat or prevent imminent or life-threatening deterioration of the following conditions:  Sepsis   Critical care was time spent personally by me on the following activities:  Development of treatment plan with patient or surrogate, evaluation of patient's response to treatment, examination of patient, obtaining history from patient or surrogate, ordering and performing treatments and interventions, ordering and review of laboratory studies, ordering and review of radiographic studies and re-evaluation of patient's condition    ____________________________________________   INITIAL IMPRESSION / ASSESSMENT AND PLAN / ED COURSE  Pertinent labs & imaging results that were available during my care of the patient were reviewed by me and considered in my medical decision making (see chart for details).  Patient presents to the emergency department today with complaints of increased weakness, constipation and urinary issues. Tachycardic and appeared ill. Concern for sepsis. Will get blood cultures, lactic acid. Will give IVFs and abx.   Clinical Course   CXR does show concerning signs of pneumonia. Blood  work shows AKI. Otherwise lactic and WBC wnl. CT abd/pel read is pending at time of sign out. Patients heart rate and blood pressure continue to be variable however I do think patient likely septic from the pneumonia. ____________________________________________   FINAL CLINICAL IMPRESSION(S) / ED DIAGNOSES  Final diagnoses:  None     Note: This dictation was prepared with Dragon dictation. Any transcriptional errors that result from this process are unintentional    Nance Pear, MD 06/08/16 [phone]

## 2016-06-07 NOTE — ED Notes (Signed)
CT called about labs still not back. Informed from previous nurse that blood was re-drawn again and sent down and told to run STAT. Labs not back yet but will continue to monitor so that pt can go to CT asap

## 2016-06-07 NOTE — ED Notes (Signed)
Called lab to check on CMP. Results still aren't back. Dr. Archie Balboa says to send her to CT and perform the scan without contrast.

## 2016-06-08 ENCOUNTER — Inpatient Hospital Stay: Payer: Medicare Other

## 2016-06-08 ENCOUNTER — Telehealth: Payer: Self-pay | Admitting: Internal Medicine

## 2016-06-08 ENCOUNTER — Encounter: Payer: Self-pay | Admitting: *Deleted

## 2016-06-08 ENCOUNTER — Emergency Department: Payer: Medicare Other

## 2016-06-08 DIAGNOSIS — I471 Supraventricular tachycardia: Secondary | ICD-10-CM | POA: Diagnosis not present

## 2016-06-08 DIAGNOSIS — L89153 Pressure ulcer of sacral region, stage 3: Secondary | ICD-10-CM | POA: Diagnosis present

## 2016-06-08 DIAGNOSIS — J440 Chronic obstructive pulmonary disease with acute lower respiratory infection: Secondary | ICD-10-CM | POA: Diagnosis present

## 2016-06-08 DIAGNOSIS — J9600 Acute respiratory failure, unspecified whether with hypoxia or hypercapnia: Secondary | ICD-10-CM

## 2016-06-08 DIAGNOSIS — R29898 Other symptoms and signs involving the musculoskeletal system: Secondary | ICD-10-CM | POA: Diagnosis not present

## 2016-06-08 DIAGNOSIS — J441 Chronic obstructive pulmonary disease with (acute) exacerbation: Secondary | ICD-10-CM | POA: Diagnosis not present

## 2016-06-08 DIAGNOSIS — I959 Hypotension, unspecified: Secondary | ICD-10-CM | POA: Diagnosis present

## 2016-06-08 DIAGNOSIS — E870 Hyperosmolality and hypernatremia: Secondary | ICD-10-CM | POA: Diagnosis not present

## 2016-06-08 DIAGNOSIS — E8770 Fluid overload, unspecified: Secondary | ICD-10-CM | POA: Diagnosis present

## 2016-06-08 DIAGNOSIS — E1165 Type 2 diabetes mellitus with hyperglycemia: Secondary | ICD-10-CM | POA: Diagnosis present

## 2016-06-08 DIAGNOSIS — J984 Other disorders of lung: Secondary | ICD-10-CM | POA: Diagnosis not present

## 2016-06-08 DIAGNOSIS — R748 Abnormal levels of other serum enzymes: Secondary | ICD-10-CM | POA: Diagnosis not present

## 2016-06-08 DIAGNOSIS — G4733 Obstructive sleep apnea (adult) (pediatric): Secondary | ICD-10-CM | POA: Diagnosis present

## 2016-06-08 DIAGNOSIS — G9340 Encephalopathy, unspecified: Secondary | ICD-10-CM | POA: Diagnosis not present

## 2016-06-08 DIAGNOSIS — D649 Anemia, unspecified: Secondary | ICD-10-CM | POA: Diagnosis present

## 2016-06-08 DIAGNOSIS — J9611 Chronic respiratory failure with hypoxia: Secondary | ICD-10-CM | POA: Diagnosis not present

## 2016-06-08 DIAGNOSIS — J9620 Acute and chronic respiratory failure, unspecified whether with hypoxia or hypercapnia: Secondary | ICD-10-CM | POA: Diagnosis not present

## 2016-06-08 DIAGNOSIS — E785 Hyperlipidemia, unspecified: Secondary | ICD-10-CM | POA: Diagnosis present

## 2016-06-08 DIAGNOSIS — R5381 Other malaise: Secondary | ICD-10-CM | POA: Diagnosis not present

## 2016-06-08 DIAGNOSIS — J449 Chronic obstructive pulmonary disease, unspecified: Secondary | ICD-10-CM | POA: Diagnosis present

## 2016-06-08 DIAGNOSIS — E860 Dehydration: Secondary | ICD-10-CM | POA: Diagnosis present

## 2016-06-08 DIAGNOSIS — J9621 Acute and chronic respiratory failure with hypoxia: Secondary | ICD-10-CM | POA: Diagnosis not present

## 2016-06-08 DIAGNOSIS — J189 Pneumonia, unspecified organism: Secondary | ICD-10-CM | POA: Diagnosis present

## 2016-06-08 DIAGNOSIS — N179 Acute kidney failure, unspecified: Secondary | ICD-10-CM | POA: Diagnosis present

## 2016-06-08 DIAGNOSIS — J9622 Acute and chronic respiratory failure with hypercapnia: Secondary | ICD-10-CM | POA: Diagnosis not present

## 2016-06-08 DIAGNOSIS — I10 Essential (primary) hypertension: Secondary | ICD-10-CM | POA: Diagnosis present

## 2016-06-08 DIAGNOSIS — Z6836 Body mass index (BMI) 36.0-36.9, adult: Secondary | ICD-10-CM | POA: Diagnosis not present

## 2016-06-08 DIAGNOSIS — E46 Unspecified protein-calorie malnutrition: Secondary | ICD-10-CM | POA: Diagnosis present

## 2016-06-08 DIAGNOSIS — E039 Hypothyroidism, unspecified: Secondary | ICD-10-CM | POA: Diagnosis present

## 2016-06-08 DIAGNOSIS — L89150 Pressure ulcer of sacral region, unstageable: Secondary | ICD-10-CM | POA: Diagnosis present

## 2016-06-08 DIAGNOSIS — J850 Gangrene and necrosis of lung: Secondary | ICD-10-CM | POA: Diagnosis not present

## 2016-06-08 DIAGNOSIS — K529 Noninfective gastroenteritis and colitis, unspecified: Secondary | ICD-10-CM | POA: Diagnosis present

## 2016-06-08 DIAGNOSIS — N390 Urinary tract infection, site not specified: Secondary | ICD-10-CM | POA: Diagnosis present

## 2016-06-08 DIAGNOSIS — K5792 Diverticulitis of intestine, part unspecified, without perforation or abscess without bleeding: Secondary | ICD-10-CM | POA: Diagnosis present

## 2016-06-08 DIAGNOSIS — J151 Pneumonia due to Pseudomonas: Secondary | ICD-10-CM | POA: Diagnosis present

## 2016-06-08 DIAGNOSIS — A4152 Sepsis due to Pseudomonas: Secondary | ICD-10-CM | POA: Diagnosis present

## 2016-06-08 LAB — TSH: TSH: 1.089 u[IU]/mL (ref 0.350–4.500)

## 2016-06-08 LAB — BLOOD GAS, ARTERIAL
ACID-BASE DEFICIT: 5.2 mmol/L — AB (ref 0.0–2.0)
Allens test (pass/fail): POSITIVE — AB
Bicarbonate: 22.3 mEq/L (ref 21.0–28.0)
FIO2: 28
O2 Saturation: 96.2 %
PCO2 ART: 52 mmHg — AB (ref 32.0–48.0)
PH ART: 7.24 — AB (ref 7.350–7.450)
Patient temperature: 37
pO2, Arterial: 97 mmHg (ref 83.0–108.0)

## 2016-06-08 LAB — GLUCOSE, CAPILLARY
Glucose-Capillary: 101 mg/dL — ABNORMAL HIGH (ref 65–99)
Glucose-Capillary: 171 mg/dL — ABNORMAL HIGH (ref 65–99)
Glucose-Capillary: 248 mg/dL — ABNORMAL HIGH (ref 65–99)
Glucose-Capillary: 276 mg/dL — ABNORMAL HIGH (ref 65–99)
Glucose-Capillary: 81 mg/dL (ref 65–99)
Glucose-Capillary: 97 mg/dL (ref 65–99)
Glucose-Capillary: 97 mg/dL (ref 65–99)

## 2016-06-08 LAB — MAGNESIUM
MAGNESIUM: 1.5 mg/dL — AB (ref 1.7–2.4)
Magnesium: 1.5 mg/dL — ABNORMAL LOW (ref 1.7–2.4)

## 2016-06-08 LAB — TROPONIN I
TROPONIN I: 0.15 ng/mL — AB (ref <0.03)
Troponin I: 0.25 ng/mL (ref <0.03)

## 2016-06-08 LAB — BASIC METABOLIC PANEL
Anion gap: 11 (ref 5–15)
BUN: 92 mg/dL — ABNORMAL HIGH (ref 6–20)
CO2: 25 mmol/L (ref 22–32)
Calcium: 8.1 mg/dL — ABNORMAL LOW (ref 8.9–10.3)
Chloride: 104 mmol/L (ref 101–111)
Creatinine, Ser: 2.28 mg/dL — ABNORMAL HIGH (ref 0.44–1.00)
GFR calc Af Amer: 24 mL/min — ABNORMAL LOW (ref >60)
GFR calc non Af Amer: 21 mL/min — ABNORMAL LOW (ref >60)
Glucose, Bld: 108 mg/dL — ABNORMAL HIGH (ref 65–99)
Potassium: 5 mmol/L (ref 3.5–5.1)
Sodium: 140 mmol/L (ref 135–145)

## 2016-06-08 LAB — URINALYSIS COMPLETE WITH MICROSCOPIC (ARMC ONLY)
Bilirubin Urine: NEGATIVE
GLUCOSE, UA: 150 mg/dL — AB
KETONES UR: NEGATIVE mg/dL
NITRITE: NEGATIVE
Protein, ur: 100 mg/dL — AB
SPECIFIC GRAVITY, URINE: 1.014 (ref 1.005–1.030)
pH: 5 (ref 5.0–8.0)

## 2016-06-08 LAB — LACTIC ACID, PLASMA: LACTIC ACID, VENOUS: 0.9 mmol/L (ref 0.5–1.9)

## 2016-06-08 LAB — MRSA PCR SCREENING: MRSA by PCR: NEGATIVE

## 2016-06-08 LAB — PHOSPHORUS
PHOSPHORUS: 7.1 mg/dL — AB (ref 2.5–4.6)
Phosphorus: 7.2 mg/dL — ABNORMAL HIGH (ref 2.5–4.6)

## 2016-06-08 LAB — HCG, QUANTITATIVE, PREGNANCY: HCG, BETA CHAIN, QUANT, S: 1 m[IU]/mL (ref <5)

## 2016-06-08 LAB — HEMOGLOBIN A1C: Hgb A1c MFr Bld: 10.5 % — ABNORMAL HIGH (ref 4.0–6.0)

## 2016-06-08 LAB — TRIGLYCERIDES: TRIGLYCERIDES: 238 mg/dL — AB (ref <150)

## 2016-06-08 MED ORDER — MIDAZOLAM HCL 2 MG/2ML IJ SOLN
1.0000 mg | INTRAMUSCULAR | Status: DC | PRN
  Administered 2016-06-08: 1 mg via INTRAVENOUS
  Filled 2016-06-08: qty 2

## 2016-06-08 MED ORDER — FENTANYL CITRATE (PF) 100 MCG/2ML IJ SOLN
50.0000 ug | Freq: Once | INTRAMUSCULAR | Status: AC
Start: 2016-06-08 — End: 2016-06-08
  Administered 2016-06-08: 50 ug via INTRAVENOUS

## 2016-06-08 MED ORDER — FENTANYL CITRATE (PF) 100 MCG/2ML IJ SOLN
50.0000 ug | INTRAMUSCULAR | Status: DC | PRN
  Administered 2016-06-09 – 2016-06-12 (×3): 50 ug via INTRAVENOUS
  Filled 2016-06-08 (×2): qty 2

## 2016-06-08 MED ORDER — ASPIRIN EC 81 MG PO TBEC
81.0000 mg | DELAYED_RELEASE_TABLET | ORAL | Status: DC
  Administered 2016-06-08: 81 mg via ORAL
  Filled 2016-06-08 (×2): qty 1

## 2016-06-08 MED ORDER — ACETAMINOPHEN 650 MG RE SUPP: 650.0000 mg | Freq: Four times a day (QID) | RECTAL | Status: DC | PRN

## 2016-06-08 MED ORDER — FENTANYL CITRATE (PF) 100 MCG/2ML IJ SOLN
50.0000 ug | INTRAMUSCULAR | Status: DC | PRN
  Administered 2016-06-08 – 2016-06-10 (×2): 50 ug via INTRAVENOUS
  Filled 2016-06-08 (×3): qty 2

## 2016-06-08 MED ORDER — INSULIN DETEMIR 100 UNIT/ML ~~LOC~~ SOLN
70.0000 [IU] | SUBCUTANEOUS | Status: DC
  Filled 2016-06-08: qty 0.7

## 2016-06-08 MED ORDER — SODIUM CHLORIDE 0.9 % IV BOLUS (SEPSIS)
1000.0000 mL | Freq: Once | INTRAVENOUS | Status: AC
  Administered 2016-06-08: 1000 mL via INTRAVENOUS

## 2016-06-08 MED ORDER — VANCOMYCIN HCL IN DEXTROSE 1-5 GM/200ML-% IV SOLN
1000.0000 mg | INTRAVENOUS | Status: DC
  Filled 2016-06-08: qty 200

## 2016-06-08 MED ORDER — INSULIN ASPART 100 UNIT/ML ~~LOC~~ SOLN
0.0000 [IU] | SUBCUTANEOUS | Status: DC
  Administered 2016-06-08: 7 [IU] via SUBCUTANEOUS
  Administered 2016-06-08: 11 [IU] via SUBCUTANEOUS
  Administered 2016-06-09: 15 [IU] via SUBCUTANEOUS
  Administered 2016-06-09 – 2016-06-10 (×6): 11 [IU] via SUBCUTANEOUS
  Administered 2016-06-10: 15 [IU] via SUBCUTANEOUS
  Administered 2016-06-10: 7 [IU] via SUBCUTANEOUS
  Administered 2016-06-10 (×2): 11 [IU] via SUBCUTANEOUS
  Administered 2016-06-11: 4 [IU] via SUBCUTANEOUS
  Administered 2016-06-11: 7 [IU] via SUBCUTANEOUS
  Administered 2016-06-11: 4 [IU] via SUBCUTANEOUS
  Administered 2016-06-11 (×2): 7 [IU] via SUBCUTANEOUS
  Administered 2016-06-11: 3 [IU] via SUBCUTANEOUS
  Administered 2016-06-12: 7 [IU] via SUBCUTANEOUS
  Administered 2016-06-12 (×3): 4 [IU] via SUBCUTANEOUS
  Administered 2016-06-12: 7 [IU] via SUBCUTANEOUS
  Administered 2016-06-12 – 2016-06-13 (×2): 4 [IU] via SUBCUTANEOUS
  Administered 2016-06-13: 7 [IU] via SUBCUTANEOUS
  Administered 2016-06-13: 4 [IU] via SUBCUTANEOUS
  Administered 2016-06-13: 7 [IU] via SUBCUTANEOUS
  Administered 2016-06-13: 4 [IU] via SUBCUTANEOUS
  Administered 2016-06-14 (×2): 7 [IU] via SUBCUTANEOUS
  Administered 2016-06-14: 4 [IU] via SUBCUTANEOUS
  Administered 2016-06-14 (×2): 3 [IU] via SUBCUTANEOUS
  Administered 2016-06-14: 6 [IU] via SUBCUTANEOUS
  Administered 2016-06-15 (×2): 3 [IU] via SUBCUTANEOUS
  Administered 2016-06-15 (×2): 4 [IU] via SUBCUTANEOUS
  Administered 2016-06-16 (×2): 3 [IU] via SUBCUTANEOUS
  Filled 2016-06-08: qty 4
  Filled 2016-06-08: qty 11
  Filled 2016-06-08: qty 15
  Filled 2016-06-08: qty 11
  Filled 2016-06-08: qty 4
  Filled 2016-06-08: qty 3
  Filled 2016-06-08 (×2): qty 11
  Filled 2016-06-08: qty 3
  Filled 2016-06-08 (×2): qty 4
  Filled 2016-06-08: qty 11
  Filled 2016-06-08: qty 4
  Filled 2016-06-08 (×2): qty 3
  Filled 2016-06-08 (×2): qty 4
  Filled 2016-06-08: qty 7
  Filled 2016-06-08: qty 3
  Filled 2016-06-08: qty 4
  Filled 2016-06-08: qty 7
  Filled 2016-06-08: qty 13
  Filled 2016-06-08: qty 15
  Filled 2016-06-08: qty 7
  Filled 2016-06-08: qty 4
  Filled 2016-06-08: qty 7
  Filled 2016-06-08: qty 4
  Filled 2016-06-08: qty 7
  Filled 2016-06-08: qty 4
  Filled 2016-06-08 (×3): qty 11
  Filled 2016-06-08: qty 3
  Filled 2016-06-08: qty 11
  Filled 2016-06-08: qty 7
  Filled 2016-06-08: qty 11
  Filled 2016-06-08: qty 7
  Filled 2016-06-08: qty 10
  Filled 2016-06-08: qty 4
  Filled 2016-06-08 (×3): qty 7

## 2016-06-08 MED ORDER — ONDANSETRON HCL 4 MG/2ML IJ SOLN: 4.0000 mg | Freq: Four times a day (QID) | INTRAMUSCULAR | Status: DC | PRN

## 2016-06-08 MED ORDER — DAKINS (1/4 STRENGTH) 0.125 % EX SOLN
Freq: Every day | CUTANEOUS | Status: AC
  Administered 2016-06-08 – 2016-06-10 (×3)
  Filled 2016-06-08: qty 473

## 2016-06-08 MED ORDER — PROPOFOL 1000 MG/100ML IV EMUL
5.0000 ug/kg/min | INTRAVENOUS | Status: DC
  Administered 2016-06-08: 30 ug/kg/min via INTRAVENOUS
  Administered 2016-06-08: 40 ug/kg/min via INTRAVENOUS
  Administered 2016-06-08: 10 ug/kg/min via INTRAVENOUS
  Filled 2016-06-08 (×3): qty 100

## 2016-06-08 MED ORDER — ALBUTEROL SULFATE (2.5 MG/3ML) 0.083% IN NEBU: 2.5000 mg | INHALATION_SOLUTION | RESPIRATORY_TRACT | Status: DC | PRN

## 2016-06-08 MED ORDER — BUPROPION HCL ER (XL) 150 MG PO TB24
150.0000 mg | ORAL_TABLET | ORAL | Status: DC
  Administered 2016-06-08: 150 mg via ORAL
  Filled 2016-06-08: qty 1

## 2016-06-08 MED ORDER — VITAL HIGH PROTEIN PO LIQD
1000.0000 mL | ORAL | Status: DC
  Administered 2016-06-08: 1000 mL

## 2016-06-08 MED ORDER — OXYCODONE HCL 5 MG PO TABS: 5.0000 mg | ORAL_TABLET | Freq: Every day | ORAL | Status: DC | PRN

## 2016-06-08 MED ORDER — ACETAMINOPHEN 325 MG PO TABS: 650.0000 mg | ORAL_TABLET | Freq: Four times a day (QID) | ORAL | Status: DC | PRN

## 2016-06-08 MED ORDER — DOPAMINE-DEXTROSE 3.2-5 MG/ML-% IV SOLN
0.0000 ug/kg/min | INTRAVENOUS | Status: DC
  Administered 2016-06-08: 7 ug/kg/min via INTRAVENOUS
  Administered 2016-06-08: 4.195 ug/kg/min via INTRAVENOUS

## 2016-06-08 MED ORDER — ESCITALOPRAM OXALATE 10 MG PO TABS
20.0000 mg | ORAL_TABLET | Freq: Every day | ORAL | Status: DC
  Administered 2016-06-08 – 2016-06-12 (×5): 20 mg via ORAL
  Filled 2016-06-08 (×5): qty 2

## 2016-06-08 MED ORDER — MIDAZOLAM HCL 5 MG/5ML IJ SOLN
INTRAMUSCULAR | Status: AC
  Filled 2016-06-08: qty 5

## 2016-06-08 MED ORDER — INSULIN ASPART 100 UNIT/ML ~~LOC~~ SOLN
0.0000 [IU] | Freq: Three times a day (TID) | SUBCUTANEOUS | Status: DC
  Administered 2016-06-08: 4 [IU] via SUBCUTANEOUS
  Filled 2016-06-08: qty 4

## 2016-06-08 MED ORDER — ROCURONIUM BROMIDE 50 MG/5ML IV SOLN
50.0000 mg | Freq: Once | INTRAVENOUS | Status: AC
  Administered 2016-06-08: 50 mg via INTRAVENOUS

## 2016-06-08 MED ORDER — FENTANYL CITRATE (PF) 100 MCG/2ML IJ SOLN
INTRAMUSCULAR | Status: AC
  Filled 2016-06-08: qty 4

## 2016-06-08 MED ORDER — DOCUSATE SODIUM 50 MG/5ML PO LIQD
100.0000 mg | Freq: Two times a day (BID) | ORAL | Status: DC
  Administered 2016-06-08 (×2): 100 mg
  Filled 2016-06-08 (×3): qty 10

## 2016-06-08 MED ORDER — PRO-STAT SUGAR FREE PO LIQD
30.0000 mL | Freq: Two times a day (BID) | ORAL | Status: DC
  Administered 2016-06-08 – 2016-06-09 (×3): 30 mL

## 2016-06-08 MED ORDER — METOPROLOL TARTRATE 50 MG PO TABS
50.0000 mg | ORAL_TABLET | Freq: Two times a day (BID) | ORAL | Status: DC
  Administered 2016-06-09 – 2016-06-11 (×3): 50 mg via ORAL
  Filled 2016-06-08 (×6): qty 1

## 2016-06-08 MED ORDER — PROPOFOL 1000 MG/100ML IV EMUL
INTRAVENOUS | Status: AC
  Administered 2016-06-08: 10 ug/kg/min via INTRAVENOUS
  Filled 2016-06-08: qty 100

## 2016-06-08 MED ORDER — CLOBETASOL PROPIONATE 0.05 % EX SOLN: 1.0000 "application " | Freq: Every day | CUTANEOUS | Status: DC | PRN

## 2016-06-08 MED ORDER — ROCURONIUM BROMIDE 50 MG/5ML IV SOLN
INTRAVENOUS | Status: AC
  Administered 2016-06-08: 50 mg via INTRAVENOUS
  Filled 2016-06-08: qty 1

## 2016-06-08 MED ORDER — SIMVASTATIN 20 MG PO TABS
40.0000 mg | ORAL_TABLET | Freq: Every day | ORAL | Status: DC
  Administered 2016-06-08 – 2016-06-11 (×4): 40 mg via ORAL
  Filled 2016-06-08 (×4): qty 2

## 2016-06-08 MED ORDER — LEVOTHYROXINE SODIUM 50 MCG PO TABS
50.0000 ug | ORAL_TABLET | Freq: Every day | ORAL | Status: DC
  Administered 2016-06-08 – 2016-06-13 (×6): 50 ug via ORAL
  Filled 2016-06-08 (×6): qty 1

## 2016-06-08 MED ORDER — DEXTROSE 5 % IV SOLN
0.0000 ug/min | INTRAVENOUS | Status: DC
  Administered 2016-06-08: 20 ug/min via INTRAVENOUS
  Administered 2016-06-08: 25 ug/min via INTRAVENOUS
  Filled 2016-06-08 (×2): qty 16

## 2016-06-08 MED ORDER — METRONIDAZOLE 250 MG PO TABS
500.0000 mg | ORAL_TABLET | Freq: Four times a day (QID) | ORAL | Status: DC
  Administered 2016-06-08 – 2016-06-09 (×4): 500 mg via ORAL
  Filled 2016-06-08 (×4): qty 2

## 2016-06-08 MED ORDER — CLONAZEPAM 0.125 MG PO TBDP
0.2500 mg | ORAL_TABLET | Freq: Three times a day (TID) | ORAL | Status: DC
Start: 2016-06-08 — End: 2016-06-08

## 2016-06-08 MED ORDER — CHLORHEXIDINE GLUCONATE 0.12% ORAL RINSE (MEDLINE KIT)
15.0000 mL | Freq: Two times a day (BID) | OROMUCOSAL | Status: DC
  Administered 2016-06-08 – 2016-06-18 (×20): 15 mL via OROMUCOSAL
  Filled 2016-06-08 (×21): qty 15

## 2016-06-08 MED ORDER — FAMOTIDINE IN NACL 20-0.9 MG/50ML-% IV SOLN
20.0000 mg | INTRAVENOUS | Status: DC
  Administered 2016-06-08 – 2016-06-11 (×4): 20 mg via INTRAVENOUS
  Filled 2016-06-08 (×5): qty 50

## 2016-06-08 MED ORDER — ONDANSETRON HCL 4 MG PO TABS: 4.0000 mg | ORAL_TABLET | Freq: Four times a day (QID) | ORAL | Status: DC | PRN

## 2016-06-08 MED ORDER — TIOTROPIUM BROMIDE MONOHYDRATE 18 MCG IN CAPS
18.0000 ug | ORAL_CAPSULE | Freq: Every day | RESPIRATORY_TRACT | Status: DC
  Administered 2016-06-08: 18 ug via RESPIRATORY_TRACT
  Filled 2016-06-08: qty 5

## 2016-06-08 MED ORDER — MIDAZOLAM HCL 2 MG/2ML IJ SOLN
INTRAMUSCULAR | Status: AC
  Administered 2016-06-08: 2 mg via INTRAVENOUS
  Filled 2016-06-08: qty 4

## 2016-06-08 MED ORDER — MOMETASONE FURO-FORMOTEROL FUM 200-5 MCG/ACT IN AERO
2.0000 | INHALATION_SPRAY | Freq: Two times a day (BID) | RESPIRATORY_TRACT | Status: DC
Start: 2016-06-08 — End: 2016-06-12
  Administered 2016-06-08: 2 via RESPIRATORY_TRACT
  Filled 2016-06-08: qty 8.8

## 2016-06-08 MED ORDER — MIDAZOLAM HCL 2 MG/2ML IJ SOLN: 1.0000 mg | INTRAMUSCULAR | Status: DC | PRN

## 2016-06-08 MED ORDER — DOCUSATE SODIUM 100 MG PO CAPS: 100.0000 mg | ORAL_CAPSULE | Freq: Two times a day (BID) | ORAL | Status: DC

## 2016-06-08 MED ORDER — FUROSEMIDE 20 MG PO TABS
20.0000 mg | ORAL_TABLET | Freq: Every day | ORAL | Status: DC
  Administered 2016-06-08 – 2016-06-12 (×5): 20 mg via ORAL
  Filled 2016-06-08 (×5): qty 1

## 2016-06-08 MED ORDER — CIPROFLOXACIN IN D5W 400 MG/200ML IV SOLN
400.0000 mg | INTRAVENOUS | Status: DC
Start: 2016-06-08 — End: 2016-06-09
  Administered 2016-06-08: 400 mg via INTRAVENOUS
  Filled 2016-06-08 (×2): qty 200

## 2016-06-08 MED ORDER — DOPAMINE-DEXTROSE 3.2-5 MG/ML-% IV SOLN
INTRAVENOUS | Status: AC
  Filled 2016-06-08: qty 250

## 2016-06-08 MED ORDER — VITAMIN E 180 MG (400 UNIT) PO CAPS: 400.0000 [IU] | ORAL_CAPSULE | Freq: Every day | ORAL | Status: DC

## 2016-06-08 MED ORDER — GABAPENTIN 100 MG PO CAPS
100.0000 mg | ORAL_CAPSULE | Freq: Every day | ORAL | Status: DC
  Administered 2016-06-08 – 2016-06-11 (×4): 100 mg via ORAL
  Filled 2016-06-08 (×4): qty 1

## 2016-06-08 MED ORDER — DEXAMETHASONE 4 MG PO TABS
4.0000 mg | ORAL_TABLET | Freq: Every day | ORAL | Status: DC
  Administered 2016-06-08: 4 mg via ORAL
  Filled 2016-06-08 (×2): qty 1

## 2016-06-08 MED ORDER — FENTANYL CITRATE (PF) 100 MCG/2ML IJ SOLN
INTRAMUSCULAR | Status: AC
  Filled 2016-06-08: qty 2

## 2016-06-08 MED ORDER — CLONAZEPAM 0.5 MG PO TABS
0.2500 mg | ORAL_TABLET | Freq: Three times a day (TID) | ORAL | Status: DC
  Administered 2016-06-08 – 2016-06-10 (×9): 0.25 mg
  Filled 2016-06-08 (×9): qty 1

## 2016-06-08 MED ORDER — VORICONAZOLE 200 MG PO TABS
200.0000 mg | ORAL_TABLET | Freq: Two times a day (BID) | ORAL | Status: DC
  Administered 2016-06-08 – 2016-06-12 (×10): 200 mg via ORAL
  Filled 2016-06-08 (×11): qty 1

## 2016-06-08 MED ORDER — CLOBETASOL PROPIONATE 0.05 % EX CREA
TOPICAL_CREAM | Freq: Every day | CUTANEOUS | Status: DC | PRN
  Administered 2016-06-09: 23:00:00 via TOPICAL
  Filled 2016-06-08: qty 15

## 2016-06-08 MED ORDER — MIDAZOLAM HCL 2 MG/2ML IJ SOLN
2.0000 mg | Freq: Once | INTRAMUSCULAR | Status: AC
  Administered 2016-06-08: 2 mg via INTRAVENOUS

## 2016-06-08 MED ORDER — SODIUM CHLORIDE 0.9 % IV SOLN
INTRAVENOUS | Status: DC
  Administered 2016-06-08 – 2016-06-09 (×4): via INTRAVENOUS

## 2016-06-08 MED ORDER — LEVOTHYROXINE SODIUM 100 MCG PO TABS
200.0000 ug | ORAL_TABLET | Freq: Every day | ORAL | Status: DC
  Administered 2016-06-08 – 2016-06-13 (×6): 200 ug via ORAL
  Filled 2016-06-08 (×6): qty 2

## 2016-06-08 MED ORDER — HEPARIN SODIUM (PORCINE) 5000 UNIT/ML IJ SOLN
5000.0000 [IU] | Freq: Three times a day (TID) | INTRAMUSCULAR | Status: DC
  Administered 2016-06-08 – 2016-06-16 (×25): 5000 [IU] via SUBCUTANEOUS
  Filled 2016-06-08 (×25): qty 1

## 2016-06-08 MED ORDER — POLYETHYLENE GLYCOL 3350 17 G PO PACK
17.0000 g | PACK | Freq: Every day | ORAL | Status: DC | PRN
  Filled 2016-06-08: qty 1

## 2016-06-08 MED ORDER — ANTISEPTIC ORAL RINSE SOLUTION (CORINZ)
7.0000 mL | OROMUCOSAL | Status: DC
  Administered 2016-06-08 – 2016-06-18 (×88): 7 mL via OROMUCOSAL
  Filled 2016-06-08 (×101): qty 7

## 2016-06-08 MED ORDER — INSULIN DETEMIR 100 UNIT/ML ~~LOC~~ SOLN
60.0000 [IU] | Freq: Every day | SUBCUTANEOUS | Status: DC
Start: 2016-06-08 — End: 2016-06-08
  Filled 2016-06-08: qty 0.6

## 2016-06-08 NOTE — H&P (Addendum)
Ebony [name] is an 70 y.o. female.   Chief Complaint: Urinary retention HPI: The patient with past medical history of breast cancers status post radiation therapy ending in March 2017, COPD and psoriasis presents to the emergency department with her family due to concerns regarding urinary retention and constipation. Initially the emergency department staff felt the patient may be septic. Fluid resuscitation, blood cultures and broad-spectrum antibiotics were initiated. The patient indicated that her abdomen was tender which prompted emergency department staff to obtain a CT of the abdomen showing acute diverticulitis which, per report, is a recurrent problem. The CT scan also revealed some cavitary lesions of the bases of her lungs. At some point while in the emergency department the patient was placed on 5 L of oxygen via nasal cannula as well. Due to her general toxic appearance and intermittent hypotension the emergency department staff called for admission.  Past Medical History:  Diagnosis Date  . Asthma   . Breast cancer ([place])   . Carpal tunnel syndrome   . COPD (chronic obstructive pulmonary disease) ([place])   . Depression   . Diabetes mellitus type 2, uncomplicated ([place])   . Hyperlipidemia   . Hypertension   . Hypothyroidism   . Osteoporosis, post-menopausal   . Psoriasis   . Sleep apnea   . Vitamin D deficiency     Past Surgical History:  Procedure Laterality Date  . BREAST EXCISIONAL BIOPSY Left 10/06/2015   np for surgery lumpectomy  . BREAST LUMPECTOMY WITH NEEDLE LOCALIZATION Left 10/06/2015   Procedure: BREAST LUMPECTOMY WITH NEEDLE LOCALIZATION;  Surgeon: Hubbard Robinson, MD;  Location: [facility] ORS;  Service: General;  Laterality: Left;  . carpal tunnel Right 1995  . Right heel spur removed    . SENTINEL NODE BIOPSY Left 10/06/2015   Procedure: SENTINEL NODE BIOPSY;  Surgeon: Hubbard Robinson, MD;  Location: [facility] ORS;  Service: General;  Laterality: Left;  . THUMB  ARTHROSCOPY Left 2000   release of a nerve to the thumb.  . TRACHEOSTOMY TUBE PLACEMENT N/A 03/14/2016   Procedure: TRACHEOSTOMY;  Surgeon: Carloyn Manner, MD;  Location: [facility] ORS;  Service: ENT;  Laterality: N/A;  . TUBAL LIGATION      Family History  Problem Relation Age of Onset  . Diabetes Mother   . Thyroid disease Mother   . COPD Father   . Emphysema Father   . Heart disease Brother   . Diabetes Brother   . Heart disease Brother   . Thyroid disease Daughter   . Thyroid disease Son    Social History:  reports that she quit smoking about 8 months ago. Her smoking use included Cigarettes. She has a 15.00 pack-year smoking history. She has never used smokeless tobacco. She reports that she does not drink alcohol or use drugs.  Allergies:  Allergies  Allergen Reactions  . Other Hives and Rash    Shellfish. Shellfish Pt reports no allergic reaction to iodine or betadine.  . Shellfish Allergy Hives and Rash    Medications Prior to Admission  Medication Sig Dispense Refill  . Adalimumab (HUMIRA PEN-PSORIASIS STARTER) 40 MG/0.8ML PNKT Inject 40 mg into the skin every 14 (fourteen) days.     Marland Kitchen albuterol (PROAIR HFA) 108 (90 BASE) MCG/ACT inhaler INHALE 2 PUFFS BY MOUTH EVERY 4 HOURS AS NEEDED FOR SHORTNESS OF BREATH AND/OR WHEEZING.    Marland Kitchen albuterol (PROVENTIL) (2.5 MG/3ML) 0.083% nebulizer solution Inhale 1 vial using nebulizer every six hours as needed for shortness of breath  and/or wheezing.    Marland Kitchen aspirin EC 81 MG tablet Take 81 mg by mouth every morning.     Marland Kitchen BREO ELLIPTA 100-25 MCG/INH AEPB Inhale 2 puffs into the lungs daily.    . budesonide-formoterol (SYMBICORT) 160-4.5 MCG/ACT inhaler INHALE 2 PUFFS BY MOUTH TWICE DAILY    . buPROPion (WELLBUTRIN XL) 150 MG 24 hr tablet Take 150 mg by mouth every morning.     . citalopram (CELEXA) 40 MG tablet Take 40 mg by mouth every morning.    . clobetasol (TEMOVATE) 0.05 % external solution Apply 1 application topically daily as needed  (for psoriasis.).     Marland Kitchen clonazePAM (KLONOPIN) 0.25 MG disintegrating tablet Take 1 tablet by mouth 3 (three) times daily.    Marland Kitchen dexamethasone (DECADRON) 4 MG tablet Take 4 mg by mouth daily.    Marland Kitchen escitalopram (LEXAPRO) 20 MG tablet Take 20 mg by mouth daily.    . furosemide (LASIX) 20 MG tablet Take 56m by mouth once a day.    . gabapentin (NEURONTIN) 100 MG capsule Take 100 mg by mouth at bedtime.    .Marland KitchenglipiZIDE (GLUCOTROL) 5 MG tablet Take 1 tablet by mouth daily before lunch.     . insulin detemir (LEVEMIR) 100 UNIT/ML injection Inject 85 units subcutaneously twice a day    . insulin lispro (HUMALOG KWIKPEN) 100 UNIT/ML KiwkPen Inject 38-48 Units into the skin 3 (three) times daily.    . INVOKANA 100 MG TABS tablet Take 100 mg by mouth daily.     .Marland Kitchenipratropium-albuterol (DUONEB) 0.5-2.5 (3) MG/3ML SOLN Inhale 3 mLs into the lungs every 4 (four) hours as needed.    .Marland Kitchenlevothyroxine (SYNTHROID, LEVOTHROID) 200 MCG tablet Take 200 mcg by mouth daily before breakfast. *Take along with 50 mcg to equal 250 mcg total dose*    . levothyroxine (SYNTHROID, LEVOTHROID) 50 MCG tablet Take 50 mcg by mouth daily before breakfast. *Take along with 200 mcg tablet to equal 250 mcg total dose.*    . losartan-hydrochlorothiazide (HYZAAR) 100-25 MG tablet Take 1 tablet by mouth daily. In am.    . metFORMIN (GLUCOPHAGE) 500 MG tablet Take 1,000 mg by mouth 2 (two) times daily with a meal.    . metoprolol (LOPRESSOR) 50 MG tablet Take 50 mg by mouth 2 (two) times daily.    .Marland KitchenoxyCODONE (OXY IR/ROXICODONE) 5 MG immediate release tablet Take 5 mg by mouth 5 (five) times daily as needed.    . simvastatin (ZOCOR) 40 MG tablet Take 40 mg by mouth at bedtime.     .Marland Kitchentiotropium (SPIRIVA) 18 MCG inhalation capsule Place 18 mcg into inhaler and inhale daily.    . vitamin E 400 UNIT capsule Take 400 Units by mouth daily.    .Marland Kitchenvoriconazole (VFEND) 200 MG tablet Take 200 mg by mouth 2 (two) times daily.      Results for orders  placed or performed during the hospital encounter of 06/07/16 (from the past 48 hour(s))  Glucose, capillary     Status: Abnormal   Collection Time: 06/07/16  9:03 PM  Result Value Ref Range   Glucose-Capillary 64 (L) 65 - 99 mg/dL  CBC with Differential     Status: Abnormal   Collection Time: 06/07/16  9:06 PM  Result Value Ref Range   WBC 7.8 3.6 - 11.0 K/uL   RBC 4.20 3.80 - 5.20 MIL/uL   Hemoglobin 12.7 12.0 - 16.0 g/dL   HCT 39.2 35.0 - 47.0 %  MCV 93.3 80.0 - 100.0 fL   MCH 30.3 26.0 - 34.0 pg   MCHC 32.5 32.0 - 36.0 g/dL   RDW 16.0 (H) 11.5 - 14.5 %   Platelets 182 150 - 440 K/uL   Neutrophils Relative % 86 %   Neutro Abs 6.7 (H) 1.4 - 6.5 K/uL   Lymphocytes Relative 12 %   Lymphs Abs 0.9 (L) 1.0 - 3.6 K/uL   Monocytes Relative 2 %   Monocytes Absolute 0.1 (L) 0.2 - 0.9 K/uL   Eosinophils Relative 0 %   Eosinophils Absolute 0.0 0 - 0.7 K/uL   Basophils Relative 0 %   Basophils Absolute 0.0 0 - 0.1 K/uL  Lactic acid, plasma     Status: None   Collection Time: 06/07/16  9:09 PM  Result Value Ref Range   Lactic Acid, Venous 1.0 0.5 - 1.9 mmol/L  Urinalysis complete, with microscopic     Status: Abnormal   Collection Time: 06/07/16  9:09 PM  Result Value Ref Range   Color, Urine AMBER (A) YELLOW   APPearance CLOUDY (A) CLEAR   Glucose, UA 150 (A) NEGATIVE mg/dL   Bilirubin Urine NEGATIVE NEGATIVE   Ketones, ur NEGATIVE NEGATIVE mg/dL   Specific Gravity, Urine 1.014 1.005 - 1.030   Hgb urine dipstick 3+ (A) NEGATIVE   pH 5.0 5.0 - 8.0   Protein, ur 100 (A) NEGATIVE mg/dL   Nitrite NEGATIVE NEGATIVE   Leukocytes, UA 3+ (A) NEGATIVE   RBC / HPF TOO NUMEROUS TO COUNT 0 - 5 RBC/hpf   WBC, UA TOO NUMEROUS TO COUNT 0 - 5 WBC/hpf   Bacteria, UA MANY (A) NONE SEEN   Squamous Epithelial / LPF 0-5 (A) NONE SEEN   Mucous PRESENT    Hyaline Casts, UA PRESENT   Glucose, capillary     Status: Abnormal   Collection Time: 06/07/16 10:16 PM  Result Value Ref Range    Glucose-Capillary 181 (H) 65 - 99 mg/dL  Comprehensive metabolic panel     Status: Abnormal   Collection Time: 06/07/16 10:31 PM  Result Value Ref Range   Sodium 136 135 - 145 mmol/L   Potassium 5.0 3.5 - 5.1 mmol/L   Chloride 99 (L) 101 - 111 mmol/L   CO2 26 22 - 32 mmol/L   Glucose, Bld 184 (H) 65 - 99 mg/dL   BUN 96 (H) 6 - 20 mg/dL   Creatinine, Ser 2.60 (H) 0.44 - 1.00 mg/dL   Calcium 8.0 (L) 8.9 - 10.3 mg/dL   Total Protein 4.5 (L) 6.5 - 8.1 g/dL   Albumin 1.6 (L) 3.5 - 5.0 g/dL   AST 25 15 - 41 U/L   ALT 33 14 - 54 U/L   Alkaline Phosphatase 72 38 - 126 U/L   Total Bilirubin 0.6 0.3 - 1.2 mg/dL   GFR calc non Af Amer 18 (L) >60 mL/min   GFR calc Af Amer 21 (L) >60 mL/min    Comment: (NOTE) The eGFR has been calculated using the CKD EPI equation. This calculation has not been validated in all clinical situations. eGFR's persistently <60 mL/min signify possible Chronic Kidney Disease.    Anion gap 11 5 - 15  Troponin I     Status: Abnormal   Collection Time: 06/07/16 10:31 PM  Result Value Ref Range   Troponin I 0.15 (HH) <0.03 ng/mL    Comment: CRITICAL RESULT CALLED TO, READ BACK BY AND VERIFIED WITH KATE BUMGARNER ON 06/08/16 AT 0208 BYCAF/TLB  hCG, quantitative, pregnancy     Status: None   Collection Time: 06/08/16  1:00 AM  Result Value Ref Range   hCG, Beta Chain, Quant, S 1 <5 mIU/mL    Comment:          GEST. AGE      CONC.  (mIU/mL)   <=1 WEEK        5 - 50     2 WEEKS       50 - 500     3 WEEKS       100 - 10,000     4 WEEKS     1,000 - 30,000     5 WEEKS     3,500 - 115,000   6-8 WEEKS     12,000 - 270,000    12 WEEKS     15,000 - 220,000        FEMALE AND NON-PREGNANT FEMALE:     LESS THAN 5 mIU/mL   Lactic acid, plasma     Status: None   Collection Time: 06/08/16  1:00 AM  Result Value Ref Range   Lactic Acid, Venous 0.9 0.5 - 1.9 mmol/L  Glucose, capillary     Status: Abnormal   Collection Time: 06/08/16  2:51 AM  Result Value Ref Range    Glucose-Capillary 101 (H) 65 - 99 mg/dL  Glucose, capillary     Status: None   Collection Time: 06/08/16  4:09 AM  Result Value Ref Range   Glucose-Capillary 97 65 - 99 mg/dL   Ct Abdomen Pelvis Wo Contrast  Result Date: 06/08/2016 CLINICAL DATA:  Constipation and decreased urine output. EXAM: CT ABDOMEN AND PELVIS WITHOUT CONTRAST TECHNIQUE: Multidetector CT imaging of the abdomen and pelvis was performed following the standard protocol without IV contrast. COMPARISON:  02/15/2016; 06/02/2013 FINDINGS: The lack of intravenous contrast limits the ability to evaluate solid abdominal organs. Lower chest: Limited visualization of the lower thorax demonstrates development of a consolidative wedge-shaped opacity within the imaged left lower lobe with suspected central cavitation measuring at least 5.5 x 5.1 cm (image 1, series 3). Additional less well-defined nodular consolidative opacities are seen within the more inferior left lower lobe (image 7, series 3 as well as within the contralateral right lower lobe (image 10, series 3). Normal heart size. Calcifications within the mitral valve annulus. No pericardial effusion. Hepatobiliary: Suspected nodularity hepatic contour. There is potential Fritz Pickerel attenuating debris within otherwise normal-appearing gallbladder. No definitive gallbladder wall thickening or pericholecystic fluid on this noncontrast examination. No ascites Pancreas: Normal noncontrast appearance of the pancreas Spleen: Normal noncontrast appearance of the spleen. In cell note is made of small splenules. Adrenals/Urinary Tract: Normal noncontrast appearance of the bilateral kidneys. No renal stones. No renal stones are seen along the expected course of either ureter or the urinary bladder. The urinary bladder is decompressed with a Foley catheter. No urinary obstruction or perinephric stranding. There is diffuse thickening of the left adrenal gland without discrete nodule. Normal noncontrast  appearance of the right adrenal gland. Stomach/Bowel: Disc retention gastrostomy tube appears appropriately positioned. Scattered colonic diverticulosis with ill-defined stranding about the descending colon within the left lower abdominal quadrant (representative axial images 62 through 69, series 2; coronal image 43, series 5), findings suggestive of acute uncomplicated diverticulitis. No evidence of perforation or definable/drainable fluid collection on this noncontrast examination. Moderate colonic stool burden without evidence of enteric obstruction. Normal noncontrast appearance of the terminal ileum and appendix. No pneumoperitoneum, pneumatosis or portal venous gas.  Vascular/Lymphatic: Scattered atherosclerotic plaque within a normal caliber abdominal aorta. No bulky retroperitoneal, mesenteric, pelvic or inguinal lymphadenopathy. Reproductive: Degenerating partially calcified fibroid within the anterior aspect of the uterine fundus measures approximately 1.5 cm in diameter. No discrete adnexal lesion on this noncontrast examination. No free fluid in the pelvic cul-de-sac. Other: Regional soft tissues appear normal. Musculoskeletal: No acute or aggressive osseous abnormalities. A bone island is incidentally noted within the right femoral head. IMPRESSION: 1. Findings worrisome for acute uncomplicated diverticulitis involving the descending colon within the left lower abdominal quadrant. No evidence of perforation or definable/drainable fluid collection on this noncontrast examination. 2. Interval development of rather extensive consolidative wedge-shaped opacities within the left lower lobe with potential central cavitation worrisome for infection and/or aspiration. 3. Appropriately positioned gastrostomy tube. Moderate colonic stool burden without evidence of enteric obstruction. 4. Mild nodularity of the hepatic contour could be indicative of early cirrhotic change. Correlation with LFTs is recommended. 5.  Suspected layering gallstones/gallbladder sludge within an otherwise normal-appearing gallbladder. Clinical correlation is advised. Further evaluation with abdominal ultrasound could be performed as indicated. 6.  Aortic Atherosclerosis (ICD10-170.0) Electronically Signed   By: Sandi Mariscal M.D.   On: 06/08/2016 00:25  Dg Chest 1 View  Result Date: 06/07/2016 CLINICAL DATA:  Constipation decreased urine output, possible sepsis EXAM: CHEST 1 VIEW COMPARISON:  03/22/2016 FINDINGS: Cardiac shadow is mildly enlarged but stable. Patient rotation to the right is noted. The lungs are well aerated. Patchy infiltrate is noted in the left lung base with a somewhat rounded appearance consistent with acute pneumonia. This is new from the prior exam. No other focal abnormality is noted. IMPRESSION: Left basilar infiltrate. Electronically Signed   By: Inez Catalina M.D.   On: 06/07/2016 21:56   Review of Systems  Unable to perform ROS: Acuity of condition    Blood pressure 99/82, pulse (!) 108, temperature 97.2 F (36.2 C), temperature source Axillary, resp. rate 19, height 5' 2"  (1.575 m), weight 89 kg (196 lb 3.4 oz), SpO2 98 %. Physical Exam  Vitals reviewed. Constitutional: She appears well-developed and well-nourished. She appears lethargic. She appears toxic. She has a sickly appearance.  HENT:  Head: Normocephalic and atraumatic.  Eyes: Conjunctivae and EOM are normal. Pupils are equal, round, and reactive to light. No scleral icterus.  Neck: Normal range of motion. Neck supple. No JVD present. No tracheal deviation present. No thyromegaly present.  Cardiovascular: Normal rate, regular rhythm and normal heart sounds.  Exam reveals no gallop and no friction rub.   No murmur heard. Respiratory: Effort normal. She has decreased breath sounds in the right lower field and the left lower field.  Transmitted upper airway noises; nasal cannula in place  GI: Soft. Bowel sounds are normal. She exhibits no  distension and no mass. There is tenderness. There is no rebound and no guarding.  G-tube in place  Genitourinary:  Genitourinary Comments: Foley catheter in place  Musculoskeletal: She exhibits no edema.  Patient is observed to move upper and extremities but is very lethargic and will not move lower extremities on command.  Lymphadenopathy:    She has no cervical adenopathy.  Neurological: She appears lethargic. No cranial nerve deficit. She exhibits normal muscle tone.  Skin: Skin is warm and dry. No rash noted. No erythema. There is pallor.  Psychiatric: She has a normal mood and affect. Judgment and thought content normal.  Difficult to fully assess mental status as the patient is somewhat lethargic     Assessment/Plan  This is a 70 year old female admitted for sepsis. 1. Sepsis: The patient meets criteria via tachycardia and intermittent tachypnea. She is immunocompromised from her Humira and chronic steroid therapy. Thus she likely cannot mount a significant leukocytosis. She has received approximately 5 L and fluid resuscitation it continues to have intermittent periods of hypotension. We will continue aggressive IV hydration. The patient received vancomycin and Zosyn in the emergency department. Her acute diverticulitis requires more anaerobic coverage of enteric organisms. I have placed patient on oral Flagyl and IV Cipro. Continue vancomycin IV per pharmacy consult. 2. Respiratory distress: Pulse oximetry does not have good plethysmography but when it does the patient sats are greater than 95% on 2-1/2 L via nasal cannula. I have turned down her supplemental oxygen and her respiratory rate is still good. Etiology is likely a combination of COPD and her cavitary lesions. Differential diagnosis for the latter include TB. The patient has been immunosuppressed. Hold Humira and wean dexamethasone. I place the patient in a negative pressure room on airborne precautions. Pulmonology consult for  possible bronchoscopy to obtain sputum. Continue Spiriva. Albuterol every 4 hours as needed. 3. Essential hypertension: The patient is currently intermittently hypotensive. Thus hold losartan and hydrochlorothiazide until pressure is stabilized. 4. Acute kidney injury: Likely secondary to low flow state. Avoid nephrotoxic agents. Continue to hydrate with intravenous fluid. Urine output is also low despite large volume resuscitation. Continue Lasix as this may help with glomerular filtration pressure. Strict I's and O's. Consider nephrology consult.  5. Diabetes mellitus type 2: Hold sulfonylurea as well as metformin and Invokana. The patient is on very large doses of long-acting insulin. I have reduced her basal insulin dose for hospital diet. Continue sliding scale insulin. 6. Hyperlipidemia: Continue statin therapy 7. Hypothyroidism: Continue Synthroid. Check TSH 8. Candidiasis: Continue voriconazole (this was likely prophylactic due to immune compromise) 9. Depression: Continue bupropion and escitalopram (I have held citalopram for right now until confirmed with outpatient pharmacy for the patient is not on both of these medications) 10. DVT prophylaxis: Heparin 11. GI prophylaxis: None The patient is a full code. (However, the patient was on hospice at home. We must clarify with the family any advanced directives). Time spent on admission orders and critical patient care approximately 60 minutes.  Harrie Foreman, MD 06/08/2016, 5:48 AM

## 2016-06-08 NOTE — ED Notes (Signed)
Called husband, Legrand Como, to update him on blood/urine/BP because this RN was told he called after he went home just checking in. Legrand Como verbalized understanding and stated he had no further questions on her care at this point.

## 2016-06-08 NOTE — Progress Notes (Signed)
Inpatient Diabetes Program Recommendations  AACE/ADA: New Consensus Statement on Inpatient Glycemic Control (2015)  Target Ranges:  Prepandial:   less than 140 mg/dL      Peak postprandial:   less than 180 mg/dL (1-2 hours)      Critically ill patients:  140 - 180 mg/dL   Results for Ebony, [name] (MRN [id number]) as of 06/08/2016 13:37  Ref. Range 06/07/2016 22:16 06/08/2016 02:51 06/08/2016 04:09 06/08/2016 07:08 06/08/2016 11:28  Glucose-Capillary Latest Ref Range: 65 - 99 mg/dL 181 (H) 101 (H) 97 81 97   Review of Glycemic Control  Diabetes history: DM2 Outpatient Diabetes medications: Glipizide 5mg  daily, Levemir 85 units BID, Humalog 38-48 units TID with meals, Metformin 1000 mg BID Current orders for Inpatient glycemic control: Novolog 0-20 units TID with meals  Inpatient Diabetes Program Recommendations: Correction (SSI): Change in patient's status as she is now intubated and will be started on tube feedings. Recommend discontinuing current Novolog correction and ordering ICU Glycemic Control order set Phase 1 to order Novolog Q4H.  Will continue to follow along and make further recommendations as more data is collected.  Thanks, Barnie Alderman, RN, MSN, CDE Diabetes Coordinator Inpatient Diabetes Program [phone] (Team Pager from [place] to [place]) [phone] ([facility] office) [phone] [facility] office) [phone] [facility] office)

## 2016-06-08 NOTE — ED Notes (Addendum)
At 0100, pt oxygen dipped to 80%. Pt already on nasal cannula 3L, upped oxygen to 5L nasal cannula, pt responded well, oxygen came up 95%  Negative pressure filter placed in room at this time.

## 2016-06-08 NOTE — Telephone Encounter (Signed)
Question regarding lung left lower lobe, brushing or washing. Please call.

## 2016-06-08 NOTE — Care Management (Signed)
Message left for husband, Legrand Como [phone]) to call RNCM. Patient had a 21+ day stay in Spring 2017 and ended up going to [facility] [place] in May 2017. It appears that patient was sent home with home health PT, RN, ST, OT however this RNCM is seeking the name of agency. RNCM will continue to follow. Chronic O2 at home. Recently stopped radiation treatment for breast Ca in march 2017 (according to history notes).  Her PCP is in [place]. She has history of Trach and PEG placement last admission.

## 2016-06-08 NOTE — Progress Notes (Signed)
Pt will be transferred under ICU attending given worsening respiratory status and Sepsis. Pt is beng intubated.by Pulmonary MD Care to be assumed by Dr Isidore Moos

## 2016-06-08 NOTE — Procedures (Signed)
  [facility]            Bronchoscopy Note   FINDINGS/SUMMARY:   -no evidence of endobronchial lesions. -Copious, thick secretions, which were easily suctioned. suggested of chronic bronchitis. -bronchoalveolar lavage taken from lingula, and left lower lobe, sent for cytology, and cultures, as well as AFB.  Indication: the patient is a 70 year old female, on CT scan. She was noted to have a left lung cavitary lesion, she has a history of breast cancer, as well as Humira use in the past. Therefore, bronchoscopy was indicated for diagnostic purposes to obtain washings to rule out TB and to look for evidence of possible cancer. The patient (or their representative) was informed of the risks (including but not limited to bleeding, infection, respiratory failure, lung injury, tooth/oral injury) and benefits of the procedure and gave consent, see chart.  The patient was not felt to be a procedure for conscious sedation given hypercapnic respiratory failure and reduced mental status before the procedure, therefore, she was intubated preoperatively, and kept on the ventilator postoperatively.  Pre-op diagnosis: lung mass Post-op diagnosis:same, chronic bronchitis. Estimated blood loss: none  Medications for procedure: propofol IV  Procedure description:  After obtaining informed consent, was called in from the patient the procedure. The patient was intubated preoperatively, see separate note. The fiberoptic bronchoscope was passed via the endotracheal tube to the main carina. And anatomical to was undertaken, all segments were entered and visualized, no endobronchial lesions were noted. There were copious secretions throughout the trachea and the right and left mainstem bronchi, these were suctioned without difficulty. Subsequently, the bronchoscope was wedged into the lingula, BAL was obtained with returns. This was sent for microbiology. The bronchoscope was then wedged in the  left lower lobe, by mouth, was obtained with returns. This was sent for cytology. Has adequate specimens were obtained, bronchoscope was removed and the patient was kept on a ventilator at previous settings. Patient Will be maintained on the ventilator overnight.   Condition post procedure:Stable, on ventilator.    Complications: none noted.   Patient instructions: --   Marda Stalker, MD.  Board Certified in Internal Medicine, Pulmonary Medicine, [place], and Sleep Medicine.  [facility] Office Number: [phone]  Patricia Pesa, M.D.  Vilinda Boehringer, M.D.  Cheral Marker, M.D  06/08/2016

## 2016-06-08 NOTE — Procedures (Signed)
Endotracheal Intubation: Patient required placement of an artificial airway secondary to resp failure.   Consent: Emergent.   Hand washing performed prior to starting the procedure.   Medications administered for sedation prior to procedure: Midazolam 2 mg IV,  Rocuronium 10 mg IV, Fentanyl 33mcg IV.   Procedure: A time out procedure was called and correct patient, name, & ID confirmed. Needed supplies and equipment were assembled and checked to include ETT, 10 ml syringe, Glidescope, Mac and Miller blades, suction, oxygen and bag mask valve, end tidal CO2 monitor. Patient was positioned to align the mouth and pharynx to facilitate visualization of the glottis.  Heart rate, SpO2 and blood pressure was continuously monitored during the procedure. Vocal cords were visualized and attempt was made to pass an 8.5 ETT which could not be passed via the narrow opening. This was changed to a size 8, endotracheal tube was placed through the vocal cords into the trachea on the second attempt.     The artificial airway was placed under direct visualization via glidescope route using a 8ETT on the second attempt.    ETT was secured at 24 cm mark.    Placement was confirmed by auscuitation of lungs with good breath sounds bilaterally and no stomach sounds.  Condensation was noted on endotracheal tube.  Pulse ox %.  CO2 detector in place with appropriate color change.   Complications: None .   Operator: Ashby Dawes.   Chest radiograph ordered and pending.     Marda Stalker, M.D.

## 2016-06-08 NOTE — ED Notes (Signed)
Changed oxygen sticker from R earlobe to pointer finger on R hand. Oxygen picking up 100% nasal cannula 5L.

## 2016-06-08 NOTE — Consult Note (Signed)
[facility] Nurse wound consult note Reason for Consult:Nonhealing unstageable pressure injury to sacrum.  Present on admission Wound type:Unstageable pressure injury Albumin 1.6 with impaired ability to heal Pressure Ulcer POA: Yes Measurement:4 cm x 3 cm unable to visualize wound bed due to the presence of devitalized tissue Wound bed: adherent slough Drainage (amount, consistency, odor) Minimal serosanguinous.  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  Apply Dakin;s moist 4x4 and cover with 4x4 gauze and ABD pad.  Change daily.  Silicone border foam dressing to stage 3 buttock injury.  Will not follow at this time.  Please re-consult if needed.  Domenic Moras RN BSN [place] Pager [phone]

## 2016-06-08 NOTE — ED Notes (Signed)
Called lab to check on troponin, they stated that it just finished.

## 2016-06-08 NOTE — Procedures (Signed)
PROCEDURE NOTE: [facility] CVL PLACEMENT  INDICATION:    Monitoring of central venous pressures and/or administration of medications optimally administered in central vein  CONSENT:   Risks of procedure as well as the alternatives were explained to the patient or surrogate. Consent for procedure obtained. A time out was performed.   PROCEDURE  Sterile technique was used including antiseptics, cap, gloves, gown, hand hygiene, mask and full body sheet.  Skin prep: Chlorhexidine; local anesthetic administered  A triple lumen catheter was placed in the L Bent vein using the Seldinger technique.   EVALUATION:  Blood flow good  Complications: No apparent complications  Patient tolerated the procedure well.  Chest X-ray ordered to verify placement and is pending   Merton Border, MD PCCM service Mobile [phone]

## 2016-06-08 NOTE — Progress Notes (Signed)
Brief Nutrition Note  Consult received for enteral/tube feeding initiation and management.  Adult Enteral Nutrition Protocol initiated. Full assessment to follow. Pt has PEG tube in place  Admitting Dx: Community acquired pneumonia [J18.9] Sepsis ([place]) [A41.9] Sepsis, due to unspecified organism ([place]) [A41.9]  Body mass index is 35.89 kg/m. Pt meets criteria for obese based on current BMI.  Labs:   Recent Labs Lab 06/07/16 2231 06/08/16 0732  NA 136 140  K 5.0 5.0  CL 99* 104  CO2 26 25  BUN 96* 92*  CREATININE 2.60* 2.28*  CALCIUM 8.0* 8.1*  GLUCOSE 184* 108*    [address] MS, RD, LDN [phone] Pager  [phone] Weekend/On-Call Pager

## 2016-06-08 NOTE — Consult Note (Addendum)
[facility] Consultation     ASSESSMENT/PLAN   70 year old female on Humira, history of breast cancer status post left lumpectomy and radiation therapy, now presents with acute diverticulitis, complicated by incidental finding of left lower lobe cavitary lesion.   PULMONARY A: COPD, does not appear to be in exacerbation at this time. -Continue inhaled medications. --Follow with Dr. Raul Del outpatient for COPD/OSA.  Left lower lobe pneumonia/cavitary lung lesion with acute respiratory failure. -We'll need to rule out tuberculosis, given her history of immunosuppression and use of Humira, status post chemotherapy for breast cancer. -We'll arrange for a bedside bronchoscopy as the patient is already in a negative pressure room, and it would not appear that she is going to be able to provide sputum to rule out TB. -We will hold further antibiotics until after bronchoscopy. OSA/Chronic hypercapnic respiratory failure.  -We'll start the patient on BiPAP daily at bedtime, check baseline blood gas.   CARDIOVASCULAR Essential hypertension. -Continue Lopressor.  RENAL Acute kidney injury. -Gentle hydration, monitor urine output, and kidney functions.  GASTROINTESTINAL GI prophylaxis as indicated. Nothing by mouth for bronchoscopy. Patient has a PEG tube in place, may resume PEG tube feeds after bronchoscopy today.  HEMATOLOGIC Breast cancer status post chemotherapy/radiation.   INFECTIOUS Left lower lobe pneumonia. -Bronchoscopy is planned to rule out TB, as well as to take cultures to look for causative organism for pneumonia.  Micro/culture results:  MRSA screen 7/27: Negative BCx2 7/26: UC 7/26: Pending Sputum--  Antibiotics: Vancomycin x1 Zosyn x1 >>  ciprofloxacin metroidazole voriconazole >>   ENDOCRINE Diabetes mellitus. Rheumatoid arthitis --Has been on humira in the past, but has not used in about 6 months.  -Glucose currently  adequately controlled. -Continue sliding scale insulin.  NEUROLOGIC Dementia/encephalopathy. -Continue to monitor mental status.   MAJOR EVENTS/TEST RESULTS: --  Best Practices  DVT Prophylaxis: SQ heparin.  GI Prophylaxis:   Husband was updated via telephone, confirmed FULL CODE STATUS, and obtained consent for bronchoscopy.  ---------------------------------------  ---------------------------------------   Name: CIANI QUARLES MRN: [id number] DOB: 1946-04-11    ADMISSION DATE:  06/07/2016 CONSULTATION DATE:  06/08/16  REFERRING MD :  Dr. Marcille Blanco  CHIEF COMPLAINT:  Dyspnea.    HISTORY OF PRESENT ILLNESS:   Pt is a 70 yo female with history of recurrent diverticulitis. The patient has a history of breast cancer, status post radiation therapy ending in March 2017. She also has a history of COPD, the patient is on chronic Humira therapy. She was brought to the ER due to urinary retention, constipation. The patient was sent for CT of the abdomen, significant for possible diverticulitis. In the ER. She initially had some mild hypotension which had subsequently resolved, she also had hypoxia which improved and the patient was placed on nasal cannula. Of note, CT of her abdomen showed cavitary lesion in the left lower lobe, concerning for mycobacterial disease, particularly in light of her treatment immunosuppression while on Humira. She was subsequently admitted to the intensive care unit. Review of her CT abdomen from 7/27 shows left lower lobe cavitary lesion. Review of blood gasses while the patient is on nasal cannula oxygen, 03/11/16 7.37/76/76/43.9, MI, consistent with compensated hypercapnic respiratory failure.  ABG drawn today showed 7.24/52/97/22; c/w acute on chronic hypercapnic respiratory failure.   PAST MEDICAL HISTORY :  Past Medical History:  Diagnosis Date  . Asthma   . Breast cancer ([place])   . Carpal tunnel syndrome   . COPD (chronic obstructive pulmonary  disease) ([place])   .  Depression   . Diabetes mellitus type 2, uncomplicated ([place])   . Hyperlipidemia   . Hypertension   . Hypothyroidism   . Osteoporosis, post-menopausal   . Psoriasis   . Sleep apnea   . Vitamin D deficiency    Past Surgical History:  Procedure Laterality Date  . BREAST EXCISIONAL BIOPSY Left 10/06/2015   np for surgery lumpectomy  . BREAST LUMPECTOMY WITH NEEDLE LOCALIZATION Left 10/06/2015   Procedure: BREAST LUMPECTOMY WITH NEEDLE LOCALIZATION;  Surgeon: Hubbard Robinson, MD;  Location: [facility] ORS;  Service: General;  Laterality: Left;  . carpal tunnel Right 1995  . Right heel spur removed    . SENTINEL NODE BIOPSY Left 10/06/2015   Procedure: SENTINEL NODE BIOPSY;  Surgeon: Hubbard Robinson, MD;  Location: [facility] ORS;  Service: General;  Laterality: Left;  . THUMB ARTHROSCOPY Left 2000   release of a nerve to the thumb.  . TRACHEOSTOMY TUBE PLACEMENT N/A 03/14/2016   Procedure: TRACHEOSTOMY;  Surgeon: Carloyn Manner, MD;  Location: [facility] ORS;  Service: ENT;  Laterality: N/A;  . TUBAL LIGATION     Prior to Admission medications   Medication Sig Start Date End Date Taking? Authorizing Provider  Adalimumab (HUMIRA PEN-PSORIASIS STARTER) 40 MG/0.8ML PNKT Inject 40 mg into the skin every 14 (fourteen) days.    Yes Historical Provider, MD  albuterol (PROAIR HFA) 108 (90 BASE) MCG/ACT inhaler INHALE 2 PUFFS BY MOUTH EVERY 4 HOURS AS NEEDED FOR SHORTNESS OF BREATH AND/OR WHEEZING. 09/28/14  Yes Historical Provider, MD  albuterol (PROVENTIL) (2.5 MG/3ML) 0.083% nebulizer solution Inhale 1 vial using nebulizer every six hours as needed for shortness of breath and/or wheezing.   Yes Historical Provider, MD  aspirin EC 81 MG tablet Take 81 mg by mouth every morning.    Yes Historical Provider, MD  BREO ELLIPTA 100-25 MCG/INH AEPB Inhale 2 puffs into the lungs daily. 05/25/16  Yes Historical Provider, MD  budesonide-formoterol (SYMBICORT) 160-4.5 MCG/ACT inhaler INHALE 2 PUFFS  BY MOUTH TWICE DAILY 09/15/14  Yes Historical Provider, MD  buPROPion (WELLBUTRIN XL) 150 MG 24 hr tablet Take 150 mg by mouth every morning.    Yes Historical Provider, MD  citalopram (CELEXA) 40 MG tablet Take 40 mg by mouth every morning.   Yes Historical Provider, MD  clobetasol (TEMOVATE) 0.05 % external solution Apply 1 application topically daily as needed (for psoriasis.).  02/15/16  Yes Historical Provider, MD  clonazePAM (KLONOPIN) 0.25 MG disintegrating tablet Take 1 tablet by mouth 3 (three) times daily. 05/25/16  Yes Historical Provider, MD  dexamethasone (DECADRON) 4 MG tablet Take 4 mg by mouth daily. 05/25/16  Yes Historical Provider, MD  escitalopram (LEXAPRO) 20 MG tablet Take 20 mg by mouth daily. 05/25/16  Yes Historical Provider, MD  furosemide (LASIX) 20 MG tablet Take 40mg  by mouth once a day.   Yes Historical Provider, MD  gabapentin (NEURONTIN) 100 MG capsule Take 100 mg by mouth at bedtime. 05/25/16  Yes Historical Provider, MD  glipiZIDE (GLUCOTROL) 5 MG tablet Take 1 tablet by mouth daily before lunch.  08/14/15  Yes Historical Provider, MD  insulin detemir (LEVEMIR) 100 UNIT/ML injection Inject 85 units subcutaneously twice a day   Yes Historical Provider, MD  insulin lispro (HUMALOG KWIKPEN) 100 UNIT/ML KiwkPen Inject 38-48 Units into the skin 3 (three) times daily.   Yes Historical Provider, MD  INVOKANA 100 MG TABS tablet Take 100 mg by mouth daily.  01/31/16  Yes Historical Provider, MD  ipratropium-albuterol (DUONEB) 0.5-2.5 (3)  MG/3ML SOLN Inhale 3 mLs into the lungs every 4 (four) hours as needed. 05/25/16  Yes Historical Provider, MD  levothyroxine (SYNTHROID, LEVOTHROID) 200 MCG tablet Take 200 mcg by mouth daily before breakfast. *Take along with 50 mcg to equal 250 mcg total dose*   Yes Historical Provider, MD  levothyroxine (SYNTHROID, LEVOTHROID) 50 MCG tablet Take 50 mcg by mouth daily before breakfast. *Take along with 200 mcg tablet to equal 250 mcg total dose.*    Yes Historical Provider, MD  losartan-hydrochlorothiazide (HYZAAR) 100-25 MG tablet Take 1 tablet by mouth daily. In am.   Yes Historical Provider, MD  metFORMIN (GLUCOPHAGE) 500 MG tablet Take 1,000 mg by mouth 2 (two) times daily with a meal.   Yes Historical Provider, MD  metoprolol (LOPRESSOR) 50 MG tablet Take 50 mg by mouth 2 (two) times daily. 05/25/16  Yes Historical Provider, MD  oxyCODONE (OXY IR/ROXICODONE) 5 MG immediate release tablet Take 5 mg by mouth 5 (five) times daily as needed. 05/25/16  Yes Historical Provider, MD  simvastatin (ZOCOR) 40 MG tablet Take 40 mg by mouth at bedtime.    Yes Historical Provider, MD  tiotropium (SPIRIVA) 18 MCG inhalation capsule Place 18 mcg into inhaler and inhale daily.   Yes Historical Provider, MD  vitamin E 400 UNIT capsule Take 400 Units by mouth daily.   Yes Historical Provider, MD  voriconazole (VFEND) 200 MG tablet Take 200 mg by mouth 2 (two) times daily. 05/25/16  Yes Historical Provider, MD   Allergies  Allergen Reactions  . Other Hives and Rash    Shellfish. Shellfish Pt reports no allergic reaction to iodine or betadine.  . Shellfish Allergy Hives and Rash    FAMILY HISTORY:  Family History  Problem Relation Age of Onset  . Diabetes Mother   . Thyroid disease Mother   . COPD Father   . Emphysema Father   . Heart disease Brother   . Diabetes Brother   . Heart disease Brother   . Thyroid disease Daughter   . Thyroid disease Son    SOCIAL HISTORY:  reports that she quit smoking about 8 months ago. Her smoking use included Cigarettes. She has a 15.00 pack-year smoking history. She has never used smokeless tobacco. She reports that she does not drink alcohol or use drugs.  REVIEW OF SYSTEMS:   Patient is currently lethargic and cannot provide a review of systems.   VITAL SIGNS: Temp:  [97.2 F (36.2 C)-97.8 F (36.6 C)] 97.2 F (36.2 C) (07/27 0400) Pulse Rate:  [33-150] 105 (07/27 0700) Resp:  [11-28] 18 (07/27  0700) BP: (68-135)/(30-101) 113/58 (07/27 0700) SpO2:  [80 %-100 %] 98 % (07/27 0700) Weight:  [185 lb 1.6 oz (84 kg)-196 lb 3.4 oz (89 kg)] 196 lb 3.4 oz (89 kg) (07/27 0400) HEMODYNAMICS:   VENTILATOR SETTINGS:   INTAKE / OUTPUT:  Intake/Output Summary (Last 24 hours) at 06/08/16 0827 Last data filed at 06/08/16 0600  Gross per 24 hour  Intake           189.58 ml  Output              100 ml  Net            89.58 ml    Physical Examination:   VS: BP (!) 113/58   Pulse (!) 105   Temp 97.2 F (36.2 C) (Axillary)   Resp 18   Ht 5\' 2"  (1.575 m)   Wt 196 lb 3.4 oz (  89 kg)   SpO2 98%   BMI 35.89 kg/m   General Appearance: No distress  Neuro:without focal findings, mental status reduced.  HEENT: PERRLA, EOM intact, no ptosis, no other lesions noticed;  Pulmonary: normal breath sounds., diaphragmatic excursion normal. CardiovascularNormal S1,S2.  No m/r/g.    Abdomen: Benign, Soft, non-tender, No masses, hepatosplenomegaly, No lymphadenopathy Renal:  No costovertebral tenderness  GU:  Not performed at this time. Endoc: No evident thyromegaly, no signs of acromegaly. Skin:   warm, no rashes, no ecchymosis  Extremities: normal, no cyanosis, clubbing, no edema, warm with normal capillary refill.    LABS: Reviewed   LABORATORY PANEL:   CBC  Recent Labs Lab 06/07/16 2106  WBC 7.8  HGB 12.7  HCT 39.2  PLT 182    Chemistries   Recent Labs Lab 06/07/16 2231  NA 136  K 5.0  CL 99*  CO2 26  GLUCOSE 184*  BUN 96*  CREATININE 2.60*  CALCIUM 8.0*  AST 25  ALT 33  ALKPHOS 72  BILITOT 0.6     Recent Labs Lab 06/07/16 2103 06/07/16 2216 06/08/16 0251 06/08/16 0409 06/08/16 0708  GLUCAP 64* 181* 101* 97 81   No results for input(s): PHART, PCO2ART, PO2ART in the last 168 hours.  Recent Labs Lab 06/07/16 2231  AST 25  ALT 33  ALKPHOS 72  BILITOT 0.6  ALBUMIN 1.6*    Cardiac Enzymes  Recent Labs Lab 06/07/16 2231  TROPONINI 0.15*     RADIOLOGY:  Ct Abdomen Pelvis Wo Contrast  Result Date: 06/08/2016 CLINICAL DATA:  Constipation and decreased urine output. EXAM: CT ABDOMEN AND PELVIS WITHOUT CONTRAST TECHNIQUE: Multidetector CT imaging of the abdomen and pelvis was performed following the standard protocol without IV contrast. COMPARISON:  02/15/2016; 06/02/2013 FINDINGS: The lack of intravenous contrast limits the ability to evaluate solid abdominal organs. Lower chest: Limited visualization of the lower thorax demonstrates development of a consolidative wedge-shaped opacity within the imaged left lower lobe with suspected central cavitation measuring at least 5.5 x 5.1 cm (image 1, series 3). Additional less well-defined nodular consolidative opacities are seen within the more inferior left lower lobe (image 7, series 3 as well as within the contralateral right lower lobe (image 10, series 3). Normal heart size. Calcifications within the mitral valve annulus. No pericardial effusion. Hepatobiliary: Suspected nodularity hepatic contour. There is potential Fritz Pickerel attenuating debris within otherwise normal-appearing gallbladder. No definitive gallbladder wall thickening or pericholecystic fluid on this noncontrast examination. No ascites Pancreas: Normal noncontrast appearance of the pancreas Spleen: Normal noncontrast appearance of the spleen. In cell note is made of small splenules. Adrenals/Urinary Tract: Normal noncontrast appearance of the bilateral kidneys. No renal stones. No renal stones are seen along the expected course of either ureter or the urinary bladder. The urinary bladder is decompressed with a Foley catheter. No urinary obstruction or perinephric stranding. There is diffuse thickening of the left adrenal gland without discrete nodule. Normal noncontrast appearance of the right adrenal gland. Stomach/Bowel: Disc retention gastrostomy tube appears appropriately positioned. Scattered colonic diverticulosis with ill-defined  stranding about the descending colon within the left lower abdominal quadrant (representative axial images 62 through 69, series 2; coronal image 43, series 5), findings suggestive of acute uncomplicated diverticulitis. No evidence of perforation or definable/drainable fluid collection on this noncontrast examination. Moderate colonic stool burden without evidence of enteric obstruction. Normal noncontrast appearance of the terminal ileum and appendix. No pneumoperitoneum, pneumatosis or portal venous gas. Vascular/Lymphatic: Scattered atherosclerotic plaque within a normal  caliber abdominal aorta. No bulky retroperitoneal, mesenteric, pelvic or inguinal lymphadenopathy. Reproductive: Degenerating partially calcified fibroid within the anterior aspect of the uterine fundus measures approximately 1.5 cm in diameter. No discrete adnexal lesion on this noncontrast examination. No free fluid in the pelvic cul-de-sac. Other: Regional soft tissues appear normal. Musculoskeletal: No acute or aggressive osseous abnormalities. A bone island is incidentally noted within the right femoral head. IMPRESSION: 1. Findings worrisome for acute uncomplicated diverticulitis involving the descending colon within the left lower abdominal quadrant. No evidence of perforation or definable/drainable fluid collection on this noncontrast examination. 2. Interval development of rather extensive consolidative wedge-shaped opacities within the left lower lobe with potential central cavitation worrisome for infection and/or aspiration. 3. Appropriately positioned gastrostomy tube. Moderate colonic stool burden without evidence of enteric obstruction. 4. Mild nodularity of the hepatic contour could be indicative of early cirrhotic change. Correlation with LFTs is recommended. 5. Suspected layering gallstones/gallbladder sludge within an otherwise normal-appearing gallbladder. Clinical correlation is advised. Further evaluation with abdominal  ultrasound could be performed as indicated. 6.  Aortic Atherosclerosis (ICD10-170.0) Electronically Signed   By: Sandi Mariscal M.D.   On: 06/08/2016 00:25  Dg Chest 1 View  Result Date: 06/07/2016 CLINICAL DATA:  Constipation decreased urine output, possible sepsis EXAM: CHEST 1 VIEW COMPARISON:  03/22/2016 FINDINGS: Cardiac shadow is mildly enlarged but stable. Patient rotation to the right is noted. The lungs are well aerated. Patchy infiltrate is noted in the left lung base with a somewhat rounded appearance consistent with acute pneumonia. This is new from the prior exam. No other focal abnormality is noted. IMPRESSION: Left basilar infiltrate. Electronically Signed   By: Inez Catalina M.D.   On: 06/07/2016 21:56      --Marda Stalker, MD.  Board Certified in Internal Medicine, Pulmonary Medicine, [place], and Sleep Medicine.  ICU Pager [phone] [facility] Office Number: [phone]  Patricia Pesa, M.D.  Vilinda Boehringer, M.D.  Merton Border, M.D   06/08/2016, 8:27 AM  [place].  I have personally obtained a history, examined the patient, evaluated laboratory and imaging results, formulated the assessment and plan and placed orders. The Patient requires high complexity decision making for assessment and support, frequent evaluation and titration of therapies, application of advanced monitoring technologies and extensive interpretation of multiple databases. The patient has critical illness that could lead imminently to failure of 1 or more organ systems and requires the highest level of physician preparedness to intervene.  Critical Care Time devoted to patient care services described in this note is 45 minutes and is exclusive of time spent in procedures.

## 2016-06-08 NOTE — Progress Notes (Signed)
Pharmacy Antibiotic Note  Ebony [name] is a 70 y.o. female admitted on 06/07/2016 with diverticulitis.  Pharmacy has been consulted for ciprofloxacin and vancomycin dosing. Patient is also on metronidazole and voriconazole.   Plan: After discussion with Dr. Ashby Dawes, will d/c MRSA PCR is negative and can d/c vancomycin.   Continue ciprofloxacin 400 mg IV q 24 hours ordered.  Height: 5\' 2"  (157.5 cm) Weight: 196 lb 3.4 oz (89 kg) IBW/kg (Calculated) : 50.1  Temp (24hrs), Avg:97.6 F (36.4 C), Min:97.2 F (36.2 C), Max:97.9 F (36.6 C)   Recent Labs Lab 06/07/16 2106 06/07/16 2109 06/07/16 2231 06/08/16 0100 06/08/16 0732  WBC 7.8  --   --   --   --   CREATININE  --   --  2.60*  --  2.28*  LATICACIDVEN  --  1.0  --  0.9  --     Estimated Creatinine Clearance: 24.5 mL/min (by C-G formula based on SCr of 2.28 mg/dL).    Allergies  Allergen Reactions  . Other Hives and Rash    Shellfish. Shellfish Pt reports no allergic reaction to iodine or betadine.  . Shellfish Allergy Hives and Rash    Antimicrobials this admission: Zosyn 7/26 >> x 1 Vancomycin 7/26 >> 7/27 ciprofloxacin  7/27 >>  Metronidazole 7/27 >> Voriconazole 7/27 >>  Dose adjustments this admission:   Microbiology results: 7/26 BCx: pending 7/26 UCx: pending  7/26 MRSA PCR: pending  7/26 UA: LE(+) NO2(-) WBC TNTC 7/26 CXR: L base infiltrate   Thank you for allowing pharmacy to be a part of this patient's care.  Ulice Dash D 06/08/2016 12:26 PM

## 2016-06-08 NOTE — Progress Notes (Addendum)
Pharmacy Antibiotic Note  Ebony [name] is a 70 y.o. female admitted on 06/07/2016 with diverticulitis.  Pharmacy has been consulted for ciprofloxacin and vancomycin dosing.  Plan:  DW 66kg  Vd 46L kei 0.023 hr-1  T1/2 30 hours Vancomycin 1 gram q 36 hours ordered with stacked dosing. Level before 4th dose. Goal trough 15-20.   Ciprofloxacin 400 mg IV q 24 hours ordered.  Height: 5\' 2"  (157.5 cm) Weight: 196 lb 3.4 oz (89 kg) IBW/kg (Calculated) : 50.1  Temp (24hrs), Avg:97.5 F (36.4 C), Min:97.2 F (36.2 C), Max:97.8 F (36.6 C)   Recent Labs Lab 06/07/16 2106 06/07/16 2109 06/07/16 2231 06/08/16 0100  WBC 7.8  --   --   --   CREATININE  --   --  2.60*  --   LATICACIDVEN  --  1.0  --  0.9    Estimated Creatinine Clearance: 21.5 mL/min (by C-G formula based on SCr of 2.6 mg/dL).    Allergies  Allergen Reactions  . Other Hives and Rash    Shellfish. Shellfish Pt reports no allergic reaction to iodine or betadine.  . Shellfish Allergy Hives and Rash    Antimicrobials this admission: Vancomycin x1 Zosyn x1 >>  ciprofloxacin metroidazole voriconazole >>   Dose adjustments this admission:   Microbiology results: 7/26 BCx: pending 7/26 UCx: pending  7/26 MRSA PCR: pending  7/26 UA: LE(+) NO2(-) WBC TNTC 7/26 CXR: L base infiltrate   Thank you for allowing pharmacy to be a part of this patient's care.  Zayla Agar S 06/08/2016 5:29 AM

## 2016-06-09 LAB — COMPREHENSIVE METABOLIC PANEL
ALBUMIN: 2 g/dL — AB (ref 3.5–5.0)
ALK PHOS: 110 U/L (ref 38–126)
ALT: 43 U/L (ref 14–54)
ANION GAP: 13 (ref 5–15)
AST: 29 U/L (ref 15–41)
BUN: 84 mg/dL — AB (ref 6–20)
CALCIUM: 8.6 mg/dL — AB (ref 8.9–10.3)
CO2: 20 mmol/L — AB (ref 22–32)
Chloride: 107 mmol/L (ref 101–111)
Creatinine, Ser: 1.71 mg/dL — ABNORMAL HIGH (ref 0.44–1.00)
GFR calc Af Amer: 34 mL/min — ABNORMAL LOW (ref >60)
GFR calc non Af Amer: 30 mL/min — ABNORMAL LOW (ref >60)
GLUCOSE: 314 mg/dL — AB (ref 65–99)
POTASSIUM: 4.3 mmol/L (ref 3.5–5.1)
SODIUM: 140 mmol/L (ref 135–145)
Total Bilirubin: 0.7 mg/dL (ref 0.3–1.2)
Total Protein: 5.5 g/dL — ABNORMAL LOW (ref 6.5–8.1)

## 2016-06-09 LAB — CYTOLOGY - NON PAP

## 2016-06-09 LAB — GLUCOSE, CAPILLARY
Glucose-Capillary: 308 mg/dL — ABNORMAL HIGH (ref 65–99)
Glucose-Capillary: 274 mg/dL — ABNORMAL HIGH (ref 65–99)
Glucose-Capillary: 294 mg/dL — ABNORMAL HIGH (ref 65–99)
Glucose-Capillary: 277 mg/dL — ABNORMAL HIGH (ref 65–99)
Glucose-Capillary: 253 mg/dL — ABNORMAL HIGH (ref 65–99)

## 2016-06-09 LAB — CBC
HEMATOCRIT: 33.2 % — AB (ref 35.0–47.0)
HEMOGLOBIN: 10.7 g/dL — AB (ref 12.0–16.0)
MCH: 30.5 pg (ref 26.0–34.0)
MCHC: 32.1 g/dL (ref 32.0–36.0)
MCV: 95 fL (ref 80.0–100.0)
Platelets: 186 10*3/uL (ref 150–440)
RBC: 3.49 MIL/uL — ABNORMAL LOW (ref 3.80–5.20)
RDW: 16.1 % — ABNORMAL HIGH (ref 11.5–14.5)
WBC: 8.8 10*3/uL (ref 3.6–11.0)

## 2016-06-09 LAB — PHOSPHORUS
PHOSPHORUS: 3 mg/dL (ref 2.5–4.6)
Phosphorus: 4.9 mg/dL — ABNORMAL HIGH (ref 2.5–4.6)

## 2016-06-09 LAB — MAGNESIUM
MAGNESIUM: 2 mg/dL (ref 1.7–2.4)
Magnesium: 1.5 mg/dL — ABNORMAL LOW (ref 1.7–2.4)

## 2016-06-09 MED ORDER — HYDROCORTISONE NA SUCCINATE PF 100 MG IJ SOLR
50.0000 mg | Freq: Four times a day (QID) | INTRAMUSCULAR | Status: DC
  Administered 2016-06-09 – 2016-06-12 (×12): 50 mg via INTRAVENOUS
  Filled 2016-06-09 (×12): qty 2

## 2016-06-09 MED ORDER — MAGNESIUM SULFATE 2 GM/50ML IV SOLN
2.0000 g | Freq: Once | INTRAVENOUS | Status: AC
  Administered 2016-06-09: 2 g via INTRAVENOUS
  Filled 2016-06-09: qty 50

## 2016-06-09 MED ORDER — SENNOSIDES 8.8 MG/5ML PO SYRP
5.0000 mL | ORAL_SOLUTION | Freq: Two times a day (BID) | ORAL | Status: DC
  Administered 2016-06-09 – 2016-06-10 (×4): 5 mL
  Filled 2016-06-09 (×4): qty 5

## 2016-06-09 MED ORDER — INSULIN ASPART 100 UNIT/ML ~~LOC~~ SOLN
3.0000 [IU] | SUBCUTANEOUS | Status: DC
  Administered 2016-06-09 – 2016-06-10 (×7): 3 [IU] via SUBCUTANEOUS
  Filled 2016-06-09 (×9): qty 3

## 2016-06-09 MED ORDER — ALBUTEROL SULFATE (2.5 MG/3ML) 0.083% IN NEBU
2.5000 mg | INHALATION_SOLUTION | Freq: Four times a day (QID) | RESPIRATORY_TRACT | Status: DC
  Administered 2016-06-09 – 2016-06-17 (×32): 2.5 mg via RESPIRATORY_TRACT
  Filled 2016-06-09 (×31): qty 3

## 2016-06-09 MED ORDER — CIPROFLOXACIN IN D5W 400 MG/200ML IV SOLN
400.0000 mg | Freq: Two times a day (BID) | INTRAVENOUS | Status: DC
  Filled 2016-06-09 (×2): qty 200

## 2016-06-09 MED ORDER — PIPERACILLIN-TAZOBACTAM 3.375 G IVPB
3.3750 g | Freq: Three times a day (TID) | INTRAVENOUS | Status: DC
  Administered 2016-06-09 – 2016-06-10 (×3): 3.375 g via INTRAVENOUS
  Filled 2016-06-09 (×5): qty 50

## 2016-06-09 MED ORDER — POLYETHYLENE GLYCOL 3350 17 G PO PACK
17.0000 g | PACK | Freq: Two times a day (BID) | ORAL | Status: DC
  Administered 2016-06-09 – 2016-06-10 (×4): 17 g
  Filled 2016-06-09 (×4): qty 1

## 2016-06-09 MED ORDER — INSULIN GLARGINE 100 UNIT/ML ~~LOC~~ SOLN
18.0000 [IU] | Freq: Every day | SUBCUTANEOUS | Status: DC
  Filled 2016-06-09: qty 0.18

## 2016-06-09 MED ORDER — INSULIN GLARGINE 100 UNIT/ML ~~LOC~~ SOLN
18.0000 [IU] | Freq: Every day | SUBCUTANEOUS | Status: DC
  Administered 2016-06-09 – 2016-06-19 (×11): 18 [IU] via SUBCUTANEOUS
  Filled 2016-06-09 (×12): qty 0.18

## 2016-06-09 MED ORDER — DOCUSATE SODIUM 50 MG/5ML PO LIQD
100.0000 mg | Freq: Two times a day (BID) | ORAL | Status: DC
  Administered 2016-06-09 – 2016-06-13 (×8): 100 mg
  Filled 2016-06-09 (×7): qty 10

## 2016-06-09 MED ORDER — METHYLPREDNISOLONE SODIUM SUCC 40 MG IJ SOLR
40.0000 mg | Freq: Three times a day (TID) | INTRAMUSCULAR | Status: DC
  Filled 2016-06-09: qty 1

## 2016-06-09 MED ORDER — BUPROPION HCL 75 MG PO TABS
75.0000 mg | ORAL_TABLET | Freq: Two times a day (BID) | ORAL | Status: DC
  Administered 2016-06-09 – 2016-06-12 (×7): 75 mg
  Filled 2016-06-09 (×7): qty 1

## 2016-06-09 MED ORDER — VITAL HIGH PROTEIN PO LIQD
1000.0000 mL | ORAL | Status: DC
  Administered 2016-06-09 – 2016-06-12 (×4): 1000 mL

## 2016-06-09 MED ORDER — INSULIN GLARGINE 100 UNIT/ML ~~LOC~~ SOLN: 18.0000 [IU] | Freq: Every day | SUBCUTANEOUS | Status: DC

## 2016-06-09 MED ORDER — ASPIRIN 81 MG PO CHEW
81.0000 mg | CHEWABLE_TABLET | Freq: Every day | ORAL | Status: DC
  Administered 2016-06-09 – 2016-06-12 (×4): 81 mg
  Filled 2016-06-09 (×4): qty 1

## 2016-06-09 MED ORDER — PRO-STAT SUGAR FREE PO LIQD
30.0000 mL | Freq: Every day | ORAL | Status: DC
  Administered 2016-06-10 – 2016-06-13 (×4): 30 mL

## 2016-06-09 NOTE — Progress Notes (Signed)
Inpatient Diabetes Program Recommendations  AACE/ADA: New Consensus Statement on Inpatient Glycemic Control (2015)  Target Ranges:  Prepandial:   less than 140 mg/dL      Peak postprandial:   less than 180 mg/dL (1-2 hours)      Critically ill patients:  140 - 180 mg/dL   Results for KANI, MONA (MRN [id number]) as of 06/09/2016 09:00  Ref. Range 06/08/2016 07:08 06/08/2016 11:28 06/08/2016 16:50 06/08/2016 19:38 06/08/2016 23:37 06/09/2016 03:38 06/09/2016 07:43  Glucose-Capillary Latest Ref Range: 65 - 99 mg/dL 81 97 171 (H) 248 (H) 276 (H) 308 (H) 274 (H)   Review of Glycemic Control  Diabetes history: DM2 Outpatient Diabetes medications: Glipizide 5mg  daily, Levemir 85 units BID, Humalog 38-48 units TID with meals, Metformin 1000 mg BID Current orders for Inpatient glycemic control: Novolog 0-20 units Q4H  Inpatient Diabetes Program Recommendations: Correction (SSI): Glucose has ranged from 81-308 mg/dl over the past 24 hours and consistently over 274 mg/dl since midnight. Recommend discontinuing current Novolog correction and ordering ICU Glycemic Control order set Phase 2 IV insulin to improve glycemic control and determine insulin needs.  Thanks, Barnie Alderman, RN, MSN, CDE Diabetes Coordinator Inpatient Diabetes Program [phone] (Team Pager from [place] to [place]) [phone] ([place] office) [phone] [facility] office) [phone] [facility] office)

## 2016-06-09 NOTE — Progress Notes (Signed)
Initial Nutrition Assessment  DOCUMENTATION CODES:   Obesity unspecified  INTERVENTION:  -TF: Vital High Protein infusing at rate of 40 ml/hr via Adult Tube Feeding Protocol. Recommend changing to goal of 50 ml/hr with Prostat daily providing 121 g of protein, 1300 kcals, 1008 mL of free water. If diprivan restarted, will need to reassess appropriate goal rate. Continue PEPuP protocol -Pt without BM x 10 days, bowel regimen adjusted by MD during ICU rounds today. If pt continues without BM, recommend further intervention regarding bowel regimen   NUTRITION DIAGNOSIS:   Inadequate oral intake related to acute illness as evidenced by NPO status.  GOAL:   Provide needs based on ASPEN/SCCM guidelines  MONITOR:   TF tolerance, Vent status, Weight trends, Labs  REASON FOR ASSESSMENT:   Consult Enteral/tube feeding initiation and management  ASSESSMENT:    70 yo female admitted with sepsis, respiratory failure with LLL pneumonia and cavitary lesions; CT abdomen with acute diverticulitis as well. Pt with hx of breast cancer s/p radiation therapy completed March 2017. Pt also hx of PEG tube   Pt s/p bronch on 7/27, remains on vent post procedure, currently off sedation, off diprivan. Pt with no BM in 10 days, bowel regimen adjusted today. Hyperglycemia, insulin regimen adjusted today.    Tolerating Vital High Protein at rate of 40 ml/hr with Prostat BID at present via PEG tube   Past Medical History:  Diagnosis Date  . Asthma   . Breast cancer ([place])   . Carpal tunnel syndrome   . COPD (chronic obstructive pulmonary disease) ([place])   . Depression   . Diabetes mellitus type 2, uncomplicated ([place])   . Hyperlipidemia   . Hypertension   . Hypothyroidism   . Osteoporosis, post-menopausal   . Psoriasis   . Sleep apnea   . Vitamin D deficiency      Diet Order:   NPO  Skin:  Wound (see comment) (unstageable and stage III pressure ulcer on sacrum, wound care following)  Last  BM:  no documented BM, constipated   Labs: magnesium 1.5, potassium wdl, phosphorus 4.9  Glucose Profile:   Recent Labs  06/09/16 0338 06/09/16 0743 06/09/16 1132  GLUCAP 308* 274* 294*    Meds: ss novolog, lantus, solucortef, lasix, miralax, senokot, colace  Height:   Ht Readings from Last 1 Encounters:  06/08/16 5\' 2"  (1.575 m)    Weight:   Wt Readings from Last 1 Encounters:  06/09/16 200 lb 9.9 oz (91 kg)    Wt Readings from Last 10 Encounters:  06/09/16 200 lb 9.9 oz (91 kg)  03/22/16 250 lb (113.4 kg)  02/21/16 222 lb 14.2 oz (101.1 kg)  02/15/16 225 lb (102.1 kg)  01/10/16 224 lb 10.4 oz (101.9 kg)  11/18/15 238 lb 13.9 oz (108.3 kg)  11/03/15 235 lb 7.2 oz (106.8 kg)  10/15/15 235 lb (106.6 kg)  10/07/15 239 lb 13.8 oz (108.8 kg)  10/06/15 235 lb (106.6 kg)    BMI:  Body mass index is 36.69 kg/m.  Estimated Nutritional Needs:   Kcal:  [id number] kcals   Protein:  >/= 100 g  Fluid:  >/= 1.5 L  EDUCATION NEEDS:   No education needs identified at this time  [place], [place], [place] [phone] Pager  [phone] Weekend/On-Call Pager

## 2016-06-09 NOTE — Telephone Encounter (Signed)
I already spoke with lab and informed them pt is admitted at this time and need to call the unit. Nothing further needed.

## 2016-06-09 NOTE — Progress Notes (Signed)
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    PULMONARY A: COPD, does not appear to be in exacerbation at this time. -Continue inhaled medications. --Follow with Dr. Raul Del outpatient for COPD/OSA.  Left lower lobe pneumonia/cavitary lung lesion with acute respiratory failure. --Per ECF, patient had chronic wheezing and dyspnea, which may have been secondary to ABPA, she was put on chronic decadron with improvement in wheezing.  --Reviewed records from ECF, pt was on vori for aspergillus pneumonia  -We'll need to rule out tuberculosis, given her history of immunosuppression on chronic decadron for COPD.  -Continue empiric abx for pneumonia.   OSA/Chronic hypercapnic respiratory failure.  -We'll start the patient on BiPAP daily at bedtime,once extubated.    CARDIOVASCULAR Hypotension due to sepsis.  --wean down pressors as tolerated.  Possible adrenal insufficiency (chronic decadron).  --Continue IV steroids.  Essential hypertension.   RENAL Acute kidney injury. -Gentle hydration, monitor urine output, and kidney functions.  GASTROINTESTINAL GI prophylaxis as indicated. Patient has a PEG tube in place, continue tube feeds.   HEMATOLOGIC Breast cancer status post chemotherapy/radiation.   INFECTIOUS Left lower lobe pneumonia. -Continue vori/abx.   Micro/culture results:  MRSA screen 7/27: Negative Bronch AFB 7/27: pending.  Bronch Sputum 7/27: pending.  BCx2 7/26:  UC 7/26: Pending Bronch; 03/07/16-- aspergillus.   Antibiotics: Vancomycin x1 Zosyn x1>> ciprofloxacin metroidazole voriconazole>>  ENDOCRINE Diabetes mellitus. Rheumatoid  arthitis --Has been on humira in the past, but has not used in about 6 months.  -Glucose currently adequately controlled. -Continue sliding scale insulin.  NEUROLOGIC Dementia/encephalopathy. -Continue to monitor mental status.   MAJOR EVENTS/TEST RESULTS: Bronchoscopy and intubation 06/08/16 Left subclavian TLC; 7/27  Best Practices  DVT Prophylaxis: heparin Sq.  GI Prophylaxis: famotidine   ---------------------------------------   ----------------------------------------   Name: Ebony [name] MRN: [id number] DOB: 07-15-1946    ADMISSION DATE:  06/07/2016     SUBJECTIVE:   Pt currently on the ventilator, can not provide history or review of systems.   Review of Systems:  --   VITAL SIGNS: Temp:  [97.8 F (36.6 C)-99.4 F (37.4 C)] 98.5 F (36.9 C) (07/28 1145) Pulse Rate:  [73-150] 86 (07/28 1121) Resp:  [17-20] 18 (07/28 0600) BP: (54-169)/(37-133) 100/54 (07/28 1121) SpO2:  [93 %-100 %] 100 % (07/28 1140) FiO2 (%):  [35 %-100 %] 35 % (07/28 1140) Weight:  [200 lb 9.9 oz (91 kg)] 200 lb 9.9 oz (91 kg) (07/28 0454) HEMODYNAMICS:   VENTILATOR SETTINGS: Vent Mode: PRVC FiO2 (%):  [35 %-100 %] 35 % Set Rate:  [18 bmp] 18 bmp Vt Set:  [500 mL] 500 mL PEEP:  [5 cmH20] 5 cmH20 INTAKE / OUTPUT:  Intake/Output Summary (Last 24 hours) at 06/09/16 1151 Last data filed at 06/09/16 0835  Gross per 24 hour  Intake          4052.44 ml  Output             3025 ml  Net          1027.44 ml    PHYSICAL EXAMINATION: Physical Examination:   VS: BP (!) 100/54   Pulse 86   Temp 98.5 F (36.9 C) (Axillary)   Resp 18   Ht 5\' 2"  (1.575 m)  Wt 200 lb 9.9 oz (91 kg)   SpO2 100%   BMI 36.69 kg/m   General Appearance: No distress  Neuro:without focal findings, mental status reduced.  HEENT: PERRLA, EOM intact. Pulmonary: normal breath sounds   CardiovascularNormal S1,S2.  No m/r/g.   Abdomen: Benign, Soft, non-tender. Renal:  No costovertebral  tenderness  GU:  Not performed at this time. Endocrine: No evident thyromegaly. Skin:   warm, no rashes, no ecchymosis  Extremities: normal, no cyanosis, clubbing.   LABS:   LABORATORY PANEL:   CBC  Recent Labs Lab 06/09/16 0540  WBC 8.8  HGB 10.7*  HCT 33.2*  PLT 186    Chemistries   Recent Labs Lab 06/09/16 0540  NA 140  K 4.3  CL 107  CO2 20*  GLUCOSE 314*  BUN 84*  CREATININE 1.71*  CALCIUM 8.6*  MG 1.5*  PHOS 4.9*  AST 29  ALT 43  ALKPHOS 110  BILITOT 0.7     Recent Labs Lab 06/08/16 1650 06/08/16 1938 06/08/16 2337 06/09/16 0338 06/09/16 0743 06/09/16 1132  GLUCAP 171* 248* 276* 308* 274* 294*    Recent Labs Lab 06/08/16 0915  PHART 7.24*  PCO2ART 52*  PO2ART 97    Recent Labs Lab 06/07/16 2231 06/09/16 0540  AST 25 29  ALT 33 43  ALKPHOS 72 110  BILITOT 0.6 0.7  ALBUMIN 1.6* 2.0*    Cardiac Enzymes  Recent Labs Lab 06/08/16 0732  TROPONINI 0.25*    RADIOLOGY:  Ct Abdomen Pelvis Wo Contrast  Result Date: 06/08/2016 CLINICAL DATA:  Constipation and decreased urine output. EXAM: CT ABDOMEN AND PELVIS WITHOUT CONTRAST TECHNIQUE: Multidetector CT imaging of the abdomen and pelvis was performed following the standard protocol without IV contrast. COMPARISON:  02/15/2016; 06/02/2013 FINDINGS: The lack of intravenous contrast limits the ability to evaluate solid abdominal organs. Lower chest: Limited visualization of the lower thorax demonstrates development of a consolidative wedge-shaped opacity within the imaged left lower lobe with suspected central cavitation measuring at least 5.5 x 5.1 cm (image 1, series 3). Additional less well-defined nodular consolidative opacities are seen within the more inferior left lower lobe (image 7, series 3 as well as within the contralateral right lower lobe (image 10, series 3). Normal heart size. Calcifications within the mitral valve annulus. No pericardial effusion. Hepatobiliary: Suspected  nodularity hepatic contour. There is potential Fritz Pickerel attenuating debris within otherwise normal-appearing gallbladder. No definitive gallbladder wall thickening or pericholecystic fluid on this noncontrast examination. No ascites Pancreas: Normal noncontrast appearance of the pancreas Spleen: Normal noncontrast appearance of the spleen. In cell note is made of small splenules. Adrenals/Urinary Tract: Normal noncontrast appearance of the bilateral kidneys. No renal stones. No renal stones are seen along the expected course of either ureter or the urinary bladder. The urinary bladder is decompressed with a Foley catheter. No urinary obstruction or perinephric stranding. There is diffuse thickening of the left adrenal gland without discrete nodule. Normal noncontrast appearance of the right adrenal gland. Stomach/Bowel: Disc retention gastrostomy tube appears appropriately positioned. Scattered colonic diverticulosis with ill-defined stranding about the descending colon within the left lower abdominal quadrant (representative axial images 62 through 69, series 2; coronal image 43, series 5), findings suggestive of acute uncomplicated diverticulitis. No evidence of perforation or definable/drainable fluid collection on this noncontrast examination. Moderate colonic stool burden without evidence of enteric obstruction. Normal noncontrast appearance of the terminal ileum and appendix. No pneumoperitoneum, pneumatosis or portal venous gas. Vascular/Lymphatic: Scattered atherosclerotic plaque within a normal caliber  abdominal aorta. No bulky retroperitoneal, mesenteric, pelvic or inguinal lymphadenopathy. Reproductive: Degenerating partially calcified fibroid within the anterior aspect of the uterine fundus measures approximately 1.5 cm in diameter. No discrete adnexal lesion on this noncontrast examination. No free fluid in the pelvic cul-de-sac. Other: Regional soft tissues appear normal. Musculoskeletal: No acute or  aggressive osseous abnormalities. A bone island is incidentally noted within the right femoral head. IMPRESSION: 1. Findings worrisome for acute uncomplicated diverticulitis involving the descending colon within the left lower abdominal quadrant. No evidence of perforation or definable/drainable fluid collection on this noncontrast examination. 2. Interval development of rather extensive consolidative wedge-shaped opacities within the left lower lobe with potential central cavitation worrisome for infection and/or aspiration. 3. Appropriately positioned gastrostomy tube. Moderate colonic stool burden without evidence of enteric obstruction. 4. Mild nodularity of the hepatic contour could be indicative of early cirrhotic change. Correlation with LFTs is recommended. 5. Suspected layering gallstones/gallbladder sludge within an otherwise normal-appearing gallbladder. Clinical correlation is advised. Further evaluation with abdominal ultrasound could be performed as indicated. 6.  Aortic Atherosclerosis (ICD10-170.0) Electronically Signed   By: Sandi Mariscal M.D.   On: 06/08/2016 00:25  Dg Chest 1 View  Result Date: 06/08/2016 CLINICAL DATA:  Central catheter placement EXAM: CHEST 1 VIEW COMPARISON:  Study obtained earlier in the day FINDINGS: Central catheter tip is in the superior vena cava. Endotracheal tube tip is 2.7 cm above the carina. No pneumothorax. There is persistent left lower lobe airspace consolidation. Lungs elsewhere clear. Heart size and pulmonary vascularity are normal. No adenopathy. IMPRESSION: Tube and catheter positions as described without pneumothorax. Persistent airspace consolidation left lower lobe. No new opacity. No change in cardiac silhouette. Electronically Signed   By: Lowella Grip III M.D.   On: 06/08/2016 15:16  Dg Chest 1 View  Result Date: 06/08/2016 CLINICAL DATA:  Dyspnea. EXAM: CHEST 1 VIEW COMPARISON:  06/07/2016 and 03/08/2016 as well as CT 03/08/2016 and 06/08/2016  FINDINGS: Patient slightly rotated to the right. Lungs are well inflated demonstrate persistent consolidation over the left lower lobe. Possible early cavitation of this peripheral consolidation on recent CT. Possible slight interval worsening opacification along the inferior aspect of this airspace process. This process is new since 03/08/2016 suggesting an infectious etiology. Prominent infarct could have a similar appearance with aseptic liquefaction. No effusion or pneumothorax. Mild stable cardiomegaly. Remainder of the exam is unchanged. IMPRESSION: Persistent consolidation over the left lower lobe with possible worsening along the inferior aspect of this airspace process. Possible early cavitation on recent CT. Findings most concerning for an infectious process and less likely infarct. Electronically Signed   By: Marin Olp M.D.   On: 06/08/2016 10:28  Dg Chest 1 View  Result Date: 06/07/2016 CLINICAL DATA:  Constipation decreased urine output, possible sepsis EXAM: CHEST 1 VIEW COMPARISON:  03/22/2016 FINDINGS: Cardiac shadow is mildly enlarged but stable. Patient rotation to the right is noted. The lungs are well aerated. Patchy infiltrate is noted in the left lung base with a somewhat rounded appearance consistent with acute pneumonia. This is new from the prior exam. No other focal abnormality is noted. IMPRESSION: Left basilar infiltrate. Electronically Signed   By: Inez Catalina M.D.   On: 06/07/2016 21:56  Dg Chest Port 1 View  Result Date: 06/08/2016 CLINICAL DATA:  Endotracheal tube placement EXAM: PORTABLE CHEST 1 VIEW COMPARISON:  06/08/2016 FINDINGS: Endotracheal tube with the tip 2.7 cm above the carina. There is left lower lobe focal consolidation. There is no pleural effusion  or pneumothorax. The heart and mediastinal contours are unremarkable. The osseous structures are unremarkable. IMPRESSION: Endotracheal tube with the tip 2.7 cm above the carina. Left lower lobe pneumonia.  Electronically Signed   By: Kathreen Devoid   On: 06/08/2016 13:15      --Marda Stalker, MD.  ICU Pager: [phone] [facility] Office Number: [phone]  Patricia Pesa, M.D.  Vilinda Boehringer, M.D.  Merton Border, M.D  06/09/2016   Critical Care Attestation.  I have personally obtained a history, examined the patient, evaluated laboratory and imaging results, formulated the assessment and plan and placed orders. The Patient requires high complexity decision making for assessment and support, frequent evaluation and titration of therapies, application of advanced monitoring technologies and extensive interpretation of multiple databases. The patient has critical illness that could lead imminently to failure of 1 or more organ systems and requires the highest level of physician preparedness to intervene.  Critical Care Time devoted to patient care services described in this note is 45 minutes and is exclusive of time spent in procedures.

## 2016-06-09 NOTE — Progress Notes (Signed)
Pharmacy Antibiotic Note  Ebony [name] is a 69 y.o. female admitted on 06/07/2016 with pneumonia.  Pharmacy has been consulted for Zosyn dosing. Patient is also on voriconazole with a h/o respiratory culture in 4/17 growing mold.   Plan: Zosyn 3.375g IV q8h (4 hour infusion).  Height: 5\' 2"  (157.5 cm) Weight: 200 lb 9.9 oz (91 kg) IBW/kg (Calculated) : 50.1  Temp (24hrs), Avg:98.5 F (36.9 C), Min:97.8 F (36.6 C), Max:99.4 F (37.4 C)   Recent Labs Lab 06/07/16 2106 06/07/16 2109 06/07/16 2231 06/08/16 0100 06/08/16 0732 06/09/16 0540  WBC 7.8  --   --   --   --  8.8  CREATININE  --   --  2.60*  --  2.28* 1.71*  LATICACIDVEN  --  1.0  --  0.9  --   --     Estimated Creatinine Clearance: 33.1 mL/min (by C-G formula based on SCr of 1.71 mg/dL).    Allergies  Allergen Reactions  . Other Hives and Rash    Shellfish. Shellfish Pt reports no allergic reaction to iodine or betadine.  . Shellfish Allergy Hives and Rash    Antimicrobials this admission: Vancomycin/Zosyn 7/26 >> x 1 Cipro 7/27 >> 7/28 Flagyl 7/27 >> 7/28 Voriconazole 7/27 >> Zosyn 7/28 >>  Dose adjustments this admission:   Microbiology results: 7/26 BCx: NGTD x 2 7/26 UCx: pending  7/27 MRSA PCR: negative 7/27 BAL: pending  Thank you for allowing pharmacy to be a part of this patient's care.  Ulice Dash D 06/09/2016 11:53 AM

## 2016-06-09 NOTE — Progress Notes (Signed)
Patient titrated down to 2 mcg on levophed. Patient tolerating well at this time, MAP 65. All other vss. Patient is following commands and opening eyes to verbal stimulus. Tolerating feedings well, no bm this shift, but will cont to monitor. Resting well, no signs of pain or distress.  Ebony [name]

## 2016-06-09 NOTE — Care Management (Signed)
Received callback from patient's husband Legrand Como; he had left message for me to call him back. I did call him back but no answer. I gave him my direct cell number and Lyn on weekend's cell number to call with questions. Patient is vented after bronch 06/08/16. I have requested any lab culture results from [facility] to be faxed to ICU ex. [phone].

## 2016-06-09 NOTE — Progress Notes (Signed)
RN spoke to Dr. Ashby Dawes on the phone about patient having two orders for IV steroids. MD clarified that he only wanted to keep the solu-cortef not the solu-medrol.

## 2016-06-10 ENCOUNTER — Inpatient Hospital Stay: Payer: Medicare Other

## 2016-06-10 LAB — BASIC METABOLIC PANEL
Anion gap: 8 (ref 5–15)
Anion gap: 7 (ref 5–15)
BUN: 63 mg/dL — AB (ref 6–20)
BUN: 58 mg/dL — AB (ref 6–20)
CALCIUM: 9.3 mg/dL (ref 8.9–10.3)
CO2: 26 mmol/L (ref 22–32)
CO2: 26 mmol/L (ref 22–32)
CREATININE: 0.81 mg/dL (ref 0.44–1.00)
CREATININE: 0.66 mg/dL (ref 0.44–1.00)
Calcium: 8.9 mg/dL (ref 8.9–10.3)
Chloride: 116 mmol/L — ABNORMAL HIGH (ref 101–111)
Chloride: 114 mmol/L — ABNORMAL HIGH (ref 101–111)
GFR calc Af Amer: >60 mL/min (ref >60)
GFR calc Af Amer: >60 mL/min (ref >60)
GLUCOSE: 263 mg/dL — AB (ref 65–99)
GLUCOSE: 269 mg/dL — AB (ref 65–99)
POTASSIUM: 2.8 mmol/L — AB (ref 3.5–5.1)
POTASSIUM: 3.7 mmol/L (ref 3.5–5.1)
SODIUM: 147 mmol/L — AB (ref 135–145)
Sodium: 150 mmol/L — ABNORMAL HIGH (ref 135–145)

## 2016-06-10 LAB — BLOOD GAS, ARTERIAL
ACID-BASE EXCESS: 0.4 mmol/L (ref 0.0–3.0)
BICARBONATE: 25.4 meq/L (ref 21.0–28.0)
FIO2: 0.25
Mechanical Rate: 18
O2 SAT: 94.3 %
PCO2 ART: 42 mmHg (ref 32.0–48.0)
PH ART: 7.39 (ref 7.350–7.450)
Patient temperature: 37
pO2, Arterial: 73 mmHg — ABNORMAL LOW (ref 83.0–108.0)

## 2016-06-10 LAB — CULTURE, BAL-QUANTITATIVE W GRAM STAIN: Culture: >=100000 — AB

## 2016-06-10 LAB — CBC
HCT: 28.6 % — ABNORMAL LOW (ref 35.0–47.0)
HEMOGLOBIN: 9.5 g/dL — AB (ref 12.0–16.0)
MCH: 30.7 pg (ref 26.0–34.0)
MCHC: 33.1 g/dL (ref 32.0–36.0)
MCV: 92.6 fL (ref 80.0–100.0)
PLATELETS: 125 10*3/uL — AB (ref 150–440)
RBC: 3.09 MIL/uL — AB (ref 3.80–5.20)
RDW: 16.2 % — ABNORMAL HIGH (ref 11.5–14.5)
WBC: 6.7 10*3/uL (ref 3.6–11.0)

## 2016-06-10 LAB — CULTURE, BAL-QUANTITATIVE

## 2016-06-10 LAB — MAGNESIUM: Magnesium: 1.8 mg/dL (ref 1.7–2.4)

## 2016-06-10 LAB — GLUCOSE, CAPILLARY
Glucose-Capillary: 215 mg/dL — ABNORMAL HIGH (ref 65–99)
Glucose-Capillary: 244 mg/dL — ABNORMAL HIGH (ref 65–99)
Glucose-Capillary: 252 mg/dL — ABNORMAL HIGH (ref 65–99)
Glucose-Capillary: 261 mg/dL — ABNORMAL HIGH (ref 65–99)
Glucose-Capillary: 272 mg/dL — ABNORMAL HIGH (ref 65–99)
Glucose-Capillary: 285 mg/dL — ABNORMAL HIGH (ref 65–99)
Glucose-Capillary: 320 mg/dL — ABNORMAL HIGH (ref 65–99)

## 2016-06-10 LAB — PHOSPHORUS: PHOSPHORUS: 2.2 mg/dL — AB (ref 2.5–4.6)

## 2016-06-10 MED ORDER — NOREPINEPHRINE 4 MG/250ML-% IV SOLN
0.0000 ug/min | INTRAVENOUS | Status: DC
  Administered 2016-06-10: 3 ug/min via INTRAVENOUS
  Filled 2016-06-10: qty 250

## 2016-06-10 MED ORDER — DEXTROSE 5 % IV SOLN: 0.0000 ug/min | INTRAVENOUS | Status: DC

## 2016-06-10 MED ORDER — POTASSIUM PHOSPHATES 15 MMOLE/5ML IV SOLN
30.0000 mmol | Freq: Once | INTRAVENOUS | Status: AC
  Administered 2016-06-10: 30 mmol via INTRAVENOUS
  Filled 2016-06-10 (×2): qty 10

## 2016-06-10 MED ORDER — POTASSIUM CHLORIDE 20 MEQ/15ML (10%) PO SOLN
40.0000 meq | ORAL | Status: AC
  Administered 2016-06-10 (×2): 40 meq
  Filled 2016-06-10 (×2): qty 30

## 2016-06-10 MED ORDER — DEXTROSE 5 % IV SOLN
INTRAVENOUS | Status: DC
  Administered 2016-06-10 – 2016-06-12 (×3): via INTRAVENOUS

## 2016-06-10 MED ORDER — SODIUM CHLORIDE 0.9 % IV SOLN
1.0000 g | Freq: Three times a day (TID) | INTRAVENOUS | Status: DC
  Administered 2016-06-10 – 2016-06-12 (×7): 1 g via INTRAVENOUS
  Filled 2016-06-10 (×8): qty 1

## 2016-06-10 MED ORDER — FREE WATER
300.0000 mL | Freq: Four times a day (QID) | Status: DC
  Administered 2016-06-10 – 2016-06-11 (×5): 300 mL

## 2016-06-10 MED ORDER — INSULIN ASPART 100 UNIT/ML ~~LOC~~ SOLN
6.0000 [IU] | SUBCUTANEOUS | Status: DC
  Administered 2016-06-10 – 2016-06-16 (×35): 6 [IU] via SUBCUTANEOUS
  Filled 2016-06-10 (×31): qty 6

## 2016-06-10 NOTE — Progress Notes (Signed)
Notified MD, Dr. Ashby Dawes, about patient's ESBL in her urine culture, patient already on meropenum per pharmacy. MD acknowledged and discussed plan of care. MD stated he did not want a spontaneous breathing trial today.

## 2016-06-10 NOTE — Progress Notes (Signed)
Alerted E-link to pt.'s AM labs suggesting K+ replacement, and modification to IVF or increase on free water flush for Na level. Spoke with Suezanne Jacquet, RN also discussed pt.'s UOP/shift. Suezanne Jacquet, RN will relay message to MD.  Will continue to monitor pt. Closely.

## 2016-06-10 NOTE — Progress Notes (Signed)
eLink Physician-Brief Progress Note Patient Name: Ebony [name] DOB: [birth year] MRN: [id number]   Date of Service  06/10/2016  HPI/Events of Note  Hypokalemia, hypophosphatemia, hypernatremia  eICU Interventions  Potassium and phos replaced.  FW added along with D5W at 30 cc/hr.  NS infusion d/ced     Intervention Category Intermediate Interventions: Electrolyte abnormality - evaluation and management  Kamir Selover 06/10/2016, 5:42 AM

## 2016-06-10 NOTE — Progress Notes (Signed)
Patient rested well during the day, became agitated at end of shift so PRN fentanyl given. Patient follows commands, but still drowsy. Patient had bowel movement during bath.  Still tolerating tube feedings well. Ebony [name]

## 2016-06-10 NOTE — Progress Notes (Signed)
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    PULMONARY A: COPD, does not appear to be in exacerbation at this time. -Continue inhaled medications. --Follow with Dr. Raul Del outpatient for COPD/OSA.  Left lower lobe pneumonia/cavitary lung lesion with acute respiratory failure. --Per ECF, patient had chronic wheezing and dyspnea, which may have been secondary to ABPA, she was put on chronic decadron with improvement in wheezing.  --Reviewed records from ECF, pt was on vori for aspergillus pneumonia  -We'll need to rule out tuberculosis, given her history of immunosuppression on chronic decadron for COPD.  -Continue empiric abx for pneumonia.   OSA/Chronic hypercapnic respiratory failure.  -We'll start the patient on BiPAP daily at bedtime,once extubated.    CARDIOVASCULAR Hypotension due to sepsis.  --wean down pressors as tolerated.  Possible adrenal insufficiency (chronic decadron).  --Continue IV steroids.  Essential hypertension.   RENAL Acute kidney injury. -Gentle hydration, monitor urine output, and kidney functions.  GASTROINTESTINAL GI prophylaxis as indicated. Patient has a PEG tube in place, continue tube feeds.   HEMATOLOGIC Breast cancer status post chemotherapy/radiation.   INFECTIOUS Left lower lobe pneumonia. -Continue vori/abx.   Micro/culture results:  MRSA screen 7/27: Negative Bronch AFB 7/27: pending.  Bronch Sputum 7/27: pending.  BCx2 7/26:  UC 7/26: ESBL E. Coli Bronch; 03/07/16-- aspergillus.   Antibiotics: Vancomycin x1 Zosyn x1>> ciprofloxacin metroidazole voriconazole>>  ENDOCRINE Diabetes mellitus. Rheumatoid  arthitis --Has been on humira in the past, but has not used in about 6 months.  -Glucose currently adequately controlled. -Continue sliding scale insulin.  NEUROLOGIC Dementia/encephalopathy. -Continue to monitor mental status.   MAJOR EVENTS/TEST RESULTS: Bronchoscopy and intubation 06/08/16 Left subclavian TLC; 7/27  Best Practices  DVT Prophylaxis: heparin Sq.  GI Prophylaxis: famotidine   ---------------------------------------   ----------------------------------------   Name: Ebony [name] MRN: [id number] DOB: 1945/12/16    ADMISSION DATE:  06/07/2016     SUBJECTIVE:   Pt currently on the ventilator, can not provide history or review of systems.   Review of Systems:  --   VITAL SIGNS: Temp:  [97.7 F (36.5 C)-98.9 F (37.2 C)] 97.7 F (36.5 C) (07/29 0800) Pulse Rate:  [60-114] 81 (07/29 0825) Resp:  [16-22] 18 (07/29 0825) BP: (74-131)/(39-101) 128/55 (07/29 0630) SpO2:  [94 %-100 %] 95 % (07/29 0843) FiO2 (%):  [25 %-35 %] 25 % (07/29 0843) Weight:  [195 lb 5.2 oz (88.6 kg)] 195 lb 5.2 oz (88.6 kg) (07/29 0424) HEMODYNAMICS:   VENTILATOR SETTINGS: Vent Mode: PRVC FiO2 (%):  [25 %-35 %] 25 % Set Rate:  [18 bmp] 18 bmp Vt Set:  [500 mL] 500 mL PEEP:  [5 cmH20] 5 cmH20 INTAKE / OUTPUT:  Intake/Output Summary (Last 24 hours) at 06/10/16 0919 Last data filed at 06/10/16 0800  Gross per 24 hour  Intake          4461.56 ml  Output             4150 ml  Net           311.56 ml    PHYSICAL EXAMINATION: Physical Examination:   VS: BP (!) 128/55   Pulse 81   Temp 97.7 F (36.5 C) (Oral)   Resp 18   Ht 5\' 2"  (  1.575 m)   Wt 195 lb 5.2 oz (88.6 kg)   SpO2 95%   BMI 35.73 kg/m   General Appearance: No distress  Neuro:without focal findings, mental status reduced.  HEENT: PERRLA, EOM intact. Pulmonary: normal breath sounds   CardiovascularNormal S1,S2.  No m/r/g.   Abdomen: Benign, Soft, non-tender. Renal:  No costovertebral  tenderness  GU:  Not performed at this time. Endocrine: No evident thyromegaly. Skin:   warm, no rashes, no ecchymosis  Extremities: normal, no cyanosis, clubbing.   LABS:   LABORATORY PANEL:   CBC  Recent Labs Lab 06/10/16 0417  WBC 6.7  HGB 9.5*  HCT 28.6*  PLT 125*    Chemistries   Recent Labs Lab 06/09/16 0540  06/10/16 0417  NA 140  --  150*  K 4.3  --  2.8*  CL 107  --  116*  CO2 20*  --  26  GLUCOSE 314*  --  263*  BUN 84*  --  63*  CREATININE 1.71*  --  0.81  CALCIUM 8.6*  --  8.9  MG 1.5*  < > 1.8  PHOS 4.9*  < > 2.2*  AST 29  --   --   ALT 43  --   --   ALKPHOS 110  --   --   BILITOT 0.7  --   --   < > = values in this interval not displayed.   Recent Labs Lab 06/09/16 1132 06/09/16 1551 06/09/16 2056 06/10/16 0009 06/10/16 0358 06/10/16 0710  GLUCAP 294* 277* 253* 261* 244* 272*    Recent Labs Lab 06/08/16 0915 06/10/16 0500  PHART 7.24* 7.39  PCO2ART 52* 42  PO2ART 97 73*    Recent Labs Lab 06/07/16 2231 06/09/16 0540  AST 25 29  ALT 33 43  ALKPHOS 72 110  BILITOT 0.6 0.7  ALBUMIN 1.6* 2.0*    Cardiac Enzymes  Recent Labs Lab 06/08/16 0732  TROPONINI 0.25*    RADIOLOGY:  Dg Chest 1 View  Result Date: 06/10/2016 CLINICAL DATA:  Shortness of breath EXAM: CHEST 1 VIEW COMPARISON:  June 08, 2016 FINDINGS: The ETT is in good position as is the left central line. Stable focal opacity in the left lung base. No other interval changes or acute abnormalities. IMPRESSION: Stable support apparatus. Stable rounded left basilar focal infiltrate. Recommend follow-up to resolution. Electronically Signed   By: Dorise Bullion III M.D   On: 06/10/2016 07:16  Dg Chest 1 View  Result Date: 06/08/2016 CLINICAL DATA:  Central catheter placement EXAM: CHEST 1 VIEW COMPARISON:  Study obtained earlier in the day FINDINGS: Central catheter tip is in the superior vena cava. Endotracheal tube tip is 2.7 cm above the carina. No pneumothorax.  There is persistent left lower lobe airspace consolidation. Lungs elsewhere clear. Heart size and pulmonary vascularity are normal. No adenopathy. IMPRESSION: Tube and catheter positions as described without pneumothorax. Persistent airspace consolidation left lower lobe. No new opacity. No change in cardiac silhouette. Electronically Signed   By: Lowella Grip III M.D.   On: 06/08/2016 15:16  Dg Chest 1 View  Result Date: 06/08/2016 CLINICAL DATA:  Dyspnea. EXAM: CHEST 1 VIEW COMPARISON:  06/07/2016 and 03/08/2016 as well as CT 03/08/2016 and 06/08/2016 FINDINGS: Patient slightly rotated to the right. Lungs are well inflated demonstrate persistent consolidation over the left lower lobe. Possible early cavitation of this peripheral consolidation on recent CT. Possible slight interval worsening opacification along the inferior aspect of this airspace process. This  process is new since 03/08/2016 suggesting an infectious etiology. Prominent infarct could have a similar appearance with aseptic liquefaction. No effusion or pneumothorax. Mild stable cardiomegaly. Remainder of the exam is unchanged. IMPRESSION: Persistent consolidation over the left lower lobe with possible worsening along the inferior aspect of this airspace process. Possible early cavitation on recent CT. Findings most concerning for an infectious process and less likely infarct. Electronically Signed   By: Marin Olp M.D.   On: 06/08/2016 10:28  Dg Chest Port 1 View  Result Date: 06/08/2016 CLINICAL DATA:  Endotracheal tube placement EXAM: PORTABLE CHEST 1 VIEW COMPARISON:  06/08/2016 FINDINGS: Endotracheal tube with the tip 2.7 cm above the carina. There is left lower lobe focal consolidation. There is no pleural effusion or pneumothorax. The heart and mediastinal contours are unremarkable. The osseous structures are unremarkable. IMPRESSION: Endotracheal tube with the tip 2.7 cm above the carina. Left lower lobe pneumonia.  Electronically Signed   By: Kathreen Devoid   On: 06/08/2016 13:15      --Marda Stalker, MD.  ICU Pager: [phone] [facility] Office Number: [phone]  Patricia Pesa, M.D.  Vilinda Boehringer, M.D.  Merton Border, M.D  06/10/2016   Critical Care Attestation.  I have personally obtained a history, examined the patient, evaluated laboratory and imaging results, formulated the assessment and plan and placed orders. The Patient requires high complexity decision making for assessment and support, frequent evaluation and titration of therapies, application of advanced monitoring technologies and extensive interpretation of multiple databases. The patient has critical illness that could lead imminently to failure of 1 or more organ systems and requires the highest level of physician preparedness to intervene.  Critical Care Time devoted to patient care services described in this note is 45 minutes and is exclusive of time spent in procedures.

## 2016-06-10 NOTE — Progress Notes (Signed)
Pharmacy Antibiotic Note  Ebony [name] is a 70 y.o. female admitted on 06/07/2016 with ESBL UTI.  Pharmacy has been consulted for meropenem dosing.  Plan: Meropenem 1 g IV q8h  Height: 5\' 2"  (157.5 cm) Weight: 195 lb 5.2 oz (88.6 kg) IBW/kg (Calculated) : 50.1  Temp (24hrs), Avg:98.2 F (36.8 C), Min:97.7 F (36.5 C), Max:98.9 F (37.2 C)   Recent Labs Lab 06/07/16 2106 06/07/16 2109 06/07/16 2231 06/08/16 0100 06/08/16 0732 06/09/16 0540 06/10/16 0417  WBC 7.8  --   --   --   --  8.8 6.7  CREATININE  --   --  2.60*  --  2.28* 1.71* 0.81  LATICACIDVEN  --  1.0  --  0.9  --   --   --     Estimated Creatinine Clearance: 68.7 mL/min (by C-G formula based on SCr of 0.81 mg/dL).    Allergies  Allergen Reactions  . Other Hives and Rash    Shellfish. Shellfish Pt reports no allergic reaction to iodine or betadine.  . Shellfish Allergy Hives and Rash    Microbiology results: 7/26 BCx: Sent 7/26 UCx: ESBL positive E.coli   Thank you for allowing pharmacy to be a part of this patient's care.  Lenis Noon, PharmD Clinical Pharmacist 06/10/2016 9:49 AM

## 2016-06-10 NOTE — Progress Notes (Signed)
Spoke with Dr. Ashby Dawes in person about patient's culture coming back positive for Pseudomonas. Patient is on meropenum for antibiotics. MD acknowledged. No new orders. Team will continue to monitor.

## 2016-06-11 ENCOUNTER — Inpatient Hospital Stay: Payer: Medicare Other

## 2016-06-11 LAB — CBC
HEMATOCRIT: 27.3 % — AB (ref 35.0–47.0)
HEMOGLOBIN: 9.1 g/dL — AB (ref 12.0–16.0)
MCH: 30.8 pg (ref 26.0–34.0)
MCHC: 33.4 g/dL (ref 32.0–36.0)
MCV: 92.3 fL (ref 80.0–100.0)
Platelets: 108 10*3/uL — ABNORMAL LOW (ref 150–440)
RBC: 2.96 MIL/uL — AB (ref 3.80–5.20)
RDW: 16.1 % — ABNORMAL HIGH (ref 11.5–14.5)
WBC: 5.2 10*3/uL (ref 3.6–11.0)

## 2016-06-11 LAB — BLOOD GAS, ARTERIAL
ACID-BASE EXCESS: 4 mmol/L — AB (ref 0.0–3.0)
BICARBONATE: 26.9 meq/L (ref 21.0–28.0)
FIO2: 0.25
MECHVT: 500 mL
Mechanical Rate: 18
O2 Saturation: 99.3 %
PATIENT TEMPERATURE: 37
PCO2 ART: 33 mmHg (ref 32.0–48.0)
PEEP/CPAP: 5 cmH2O
PH ART: 7.52 — AB (ref 7.350–7.450)
PO2 ART: 136 mmHg — AB (ref 83.0–108.0)

## 2016-06-11 LAB — GLUCOSE, CAPILLARY
Glucose-Capillary: 211 mg/dL — ABNORMAL HIGH (ref 65–99)
Glucose-Capillary: 188 mg/dL — ABNORMAL HIGH (ref 65–99)
Glucose-Capillary: 196 mg/dL — ABNORMAL HIGH (ref 65–99)
Glucose-Capillary: 148 mg/dL — ABNORMAL HIGH (ref 65–99)
Glucose-Capillary: 207 mg/dL — ABNORMAL HIGH (ref 65–99)

## 2016-06-11 LAB — BASIC METABOLIC PANEL
Anion gap: 7 (ref 5–15)
BUN: 52 mg/dL — ABNORMAL HIGH (ref 6–20)
CHLORIDE: 115 mmol/L — AB (ref 101–111)
CO2: 28 mmol/L (ref 22–32)
CREATININE: 0.56 mg/dL (ref 0.44–1.00)
Calcium: 9.4 mg/dL (ref 8.9–10.3)
GFR calc non Af Amer: >60 mL/min (ref >60)
Glucose, Bld: 231 mg/dL — ABNORMAL HIGH (ref 65–99)
POTASSIUM: 3.5 mmol/L (ref 3.5–5.1)
SODIUM: 150 mmol/L — AB (ref 135–145)

## 2016-06-11 LAB — URINE CULTURE: Culture: >=100000 — AB

## 2016-06-11 LAB — TRIGLYCERIDES: Triglycerides: 253 mg/dL — ABNORMAL HIGH (ref <150)

## 2016-06-11 MED ORDER — CLONAZEPAM 0.5 MG PO TABS
0.2500 mg | ORAL_TABLET | Freq: Three times a day (TID) | ORAL | Status: DC | PRN
  Administered 2016-06-11: 0.25 mg
  Filled 2016-06-11: qty 1

## 2016-06-11 MED ORDER — METOPROLOL TARTRATE 5 MG/5ML IV SOLN
INTRAVENOUS | Status: AC
  Administered 2016-06-11: 2.5 mg via INTRAVENOUS
  Filled 2016-06-11: qty 5

## 2016-06-11 MED ORDER — DEXTROSE 5 % IV SOLN
5.0000 mg/h | INTRAVENOUS | Status: DC
  Administered 2016-06-11 – 2016-06-13 (×3): 5 mg/h via INTRAVENOUS
  Filled 2016-06-11 (×3): qty 100

## 2016-06-11 MED ORDER — POLYETHYLENE GLYCOL 3350 17 G PO PACK: 17.0000 g | PACK | Freq: Every day | ORAL | Status: DC | PRN

## 2016-06-11 MED ORDER — FREE WATER
250.0000 mL | Status: DC
  Administered 2016-06-11 – 2016-06-15 (×24): 250 mL

## 2016-06-11 MED ORDER — DILTIAZEM LOAD VIA INFUSION
10.0000 mg | Freq: Once | INTRAVENOUS | Status: AC
  Administered 2016-06-11: 10 mg via INTRAVENOUS
  Filled 2016-06-11: qty 10

## 2016-06-11 MED ORDER — METOPROLOL TARTRATE 5 MG/5ML IV SOLN
2.5000 mg | INTRAVENOUS | Status: DC | PRN
  Administered 2016-06-11 (×2): 2.5 mg via INTRAVENOUS
  Filled 2016-06-11: qty 5

## 2016-06-11 NOTE — Progress Notes (Signed)
Notified E Link RN about patient going from NSR in 70-80s to afib anywhere from 110s to 140s. While rate improved from previous 170s-190s patient without prior history of afib and will not sustain heart rate goal of 120 or less even after max dose of prn metoprolol given. Patient appears to be resting comfortably, SBP 90s-100s and MAP greater than 65. E Link RN to notify Margaree Mackintosh MD.

## 2016-06-11 NOTE — Progress Notes (Signed)
eLink Physician-Brief Progress Note Patient Name: Ebony [name] DOB: Apr 14, 1946 MRN: [id number]   Date of Service  06/11/2016  HPI/Events of Note  Patient in afib with HR 120-140.  Has not responded significantly to bblocker.  eICU Interventions  Stop oral lopressor and start cardizem drip     Intervention Category Intermediate Interventions: Arrhythmia - evaluation and management  Mauri Brooklyn, P 06/11/2016, 5:23 PM

## 2016-06-11 NOTE — Progress Notes (Signed)
Received call from Adalberto Ill RN regarding critical lab value urine with E Coli and Klebsiella Pneumonie, carbapenemase resistant Enterobacteriacae and notified Dr. Tamala Julian eMD.

## 2016-06-11 NOTE — Progress Notes (Signed)
RN notified by lab about patient's urine culture  (from 06/07/16) results including sensitvities. Patient currently on meropenum IV. This RN notified E Link RN who is notifying Margaree Mackintosh MD.

## 2016-06-11 NOTE — Plan of Care (Signed)
Problem: Education: Goal: Knowledge of [facility] Education information/materials will improve Outcome: Not Progressing Patient continued on ventilator. More alert this shift after klonopin changed to prn.  Problem: Physical Regulation: Goal: Ability to maintain clinical measurements within normal limits will improve Outcome: Progressing Patient back in sinus rhythm after cardizem bolus given and infusion started.   Problem: Bowel/Gastric: Goal: Will not experience complications related to bowel motility Outcome: Progressing Several bowel movements during shift

## 2016-06-11 NOTE — Progress Notes (Signed)
Spoke with Dr. Ashby Dawes in person about patient's heart rate and rhythm post roll while patient being cleaned after bowel movements. MD evaluated the patient in person and ordered IV metoprolol prn for heart rate greater than 120 which was given, oral metoprolol given, patient's prn klonopin, and patient had initially given prn fentanyl dose when became distressed during rolls.

## 2016-06-11 NOTE — Progress Notes (Signed)
Pharmacy Antibiotic Note  Ebony [name] is a 70 y.o. female admitted on 06/07/2016 with ESBL UTI.  Pharmacy has been consulted for meropenem dosing.  Plan: Meropenem 1 g IV q8h  Height: 5\' 2"  (157.5 cm) Weight: 197 lb 1.5 oz (89.4 kg) IBW/kg (Calculated) : 50.1  Temp (24hrs), Avg:97.9 F (36.6 C), Min:97.4 F (36.3 C), Max:98.2 F (36.8 C)   Recent Labs Lab 06/07/16 2106 06/07/16 2109  06/08/16 0100 06/08/16 0732 06/09/16 0540 06/10/16 0417 06/10/16 1933 06/11/16 0426  WBC 7.8  --   --   --   --  8.8 6.7  --  5.2  CREATININE  --   --   < >  --  2.28* 1.71* 0.81 0.66 0.56  LATICACIDVEN  --  1.0  --  0.9  --   --   --   --   --   < > = values in this interval not displayed.  Estimated Creatinine Clearance: 69.9 mL/min (by C-G formula based on SCr of 0.8 mg/dL).    Allergies  Allergen Reactions  . Other Hives and Rash    Shellfish. Shellfish Pt reports no allergic reaction to iodine or betadine.  . Shellfish Allergy Hives and Rash    Microbiology results: 7/26 BCx: Sent 7/26 UCx: ESBL positive E.coli   Thank you for allowing pharmacy to be a part of this patient's care.  Noralee Space, PharmD Clinical Pharmacist 06/11/2016 12:10 PM

## 2016-06-11 NOTE — Progress Notes (Signed)
eLink Physician-Brief Progress Note Patient Name: Ebony [name] DOB: [birth year] MRN: [id number]   Date of Service  06/11/2016  HPI/Events of Note  Urine culture with ESBL Ecoli and Klebsiella that is panresistant.  Patient is afebrile with normal WBC and she is not on pressors.  At this time will not change abx  eICU Interventions  Continue meropenem     Intervention Category Major Interventions: Infection - evaluation and management  Mauri Brooklyn, P 06/11/2016, 4:51 PM

## 2016-06-11 NOTE — Progress Notes (Signed)
RN and MD spoke about patient's serum sodium of 150 with this mornings labs. MD ordered free waters flushes changed to 250 mL every 4 hours.

## 2016-06-11 NOTE — Progress Notes (Signed)
Anticoagulation monitoring  Plt trending down 182/186>125>108 today. Spoke with Dr. Juanell Fairly about plt trending down and whether there is a need to evaluate for HIT. MD stated to continue SQ heparin and monitor. CBC already ordered for AM.

## 2016-06-11 NOTE — Progress Notes (Signed)
RN spoke with Dr. Ashby Dawes about patient's lethargy in spite of no prn pain medication for 12 hours or continuous sedation medication since 08:30 Friday 06/09/16. Patient does receive 0.25 klonopin three times a day through her PEG tube. MD changed that to a prn dosage only. Patient also having bowel movements now, so MD changed bowel regimen to just scheduled colace twice a day. Discussed patient's condition and spontaneous breathing trial. Since patient had to have 12 of pressure support in SBT, MD ordered patient to continue SBT today as tolerated and go back on PRVC mode at night to allow patient to rest for tomorrow.Team will continue to monitor.

## 2016-06-11 NOTE — Progress Notes (Signed)
Spoke with Dr. Ashby Dawes about patient's triglyceride level in light of original triglyceride level and the fact patient has been off propofol since Friday. MD ordered propofol discontinued. Team will continue to monitor.

## 2016-06-11 NOTE — Progress Notes (Addendum)
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    PULMONARY A: COPD, does not appear to be in exacerbation at this time. -Continue inhaled medications. --Follow with Dr. Raul Del outpatient for COPD/OSA.   Left lower lobe pneumonia with pseudomonas/cavitary lung lesion with acute respiratory failure-- vent dependent.  --Per ECF, patient had chronic wheezing and dyspnea, (which may have been secondary to ABPA?), she was put on chronic decadron with improvement in wheezing.  --On vori for aspergillus pneumonia  -We'll need to rule out tuberculosis, given her history of immunosuppression on chronic decadron for COPD s/p bronch with washings 7/27-- AFB STILL PENDING!!. -Continue empiric abx for pneumonia.  --RR decreased this am. Weaning trial as tolerated today.   OSA/Chronic hypercapnic respiratory failure.  -We'll start the patient on BiPAP daily at bedtime,once extubated.    CARDIOVASCULAR Hypotension due to sepsis due to UTI and pneumonia.  --Pt now off pressors.  Possible adrenal insufficiency (chronic decadron).  --Wean down steroids.  Essential hypertension.   RENAL Acute kidney injury. Hypernatremia.  -Free water replacement,  monitor urine output, and kidney functions.   GASTROINTESTINAL GI prophylaxis as indicated. Patient has a PEG tube in place, continue tube feeds.   HEMATOLOGIC Breast cancer status post chemotherapy/radiation.   INFECTIOUS Left lower lobe pneumonia. -Continue vori/abx.   Micro/culture results:  MRSA screen 7/27: Negative Bronch AFB 7/27: pending.  Bronch Sputum 7/27: pending.  BCx2 7/26:  UC 7/26: ESBL E. Coli Bronch; 03/07/16--  aspergillus.   Antibiotics: Vancomycin x1 Zosyn x1>> ciprofloxacin metroidazole voriconazole>>  ENDOCRINE Diabetes mellitus. Rheumatoid arthitis --Has been on humira in the past, but has not used in about 6 months.  -Glucose currently adequately controlled. -Continue sliding scale insulin.  NEUROLOGIC Dementia/encephalopathy. -Continue to monitor mental status.   MAJOR EVENTS/TEST RESULTS: Bronchoscopy and intubation 06/08/16 Left subclavian TLC; 7/27  Best Practices  DVT Prophylaxis: heparin Sq.  GI Prophylaxis: famotidine   ---------------------------------------   ----------------------------------------   Name: Ebony [name] MRN: [id number] DOB: 1945-11-18    ADMISSION DATE:  06/07/2016     SUBJECTIVE:   Pt currently on the ventilator, can not provide history or review of systems.   Review of Systems:  --   VITAL SIGNS: Temp:  [97.4 F (36.3 C)-98.2 F (36.8 C)] 97.8 F (36.6 C) (07/30 0737) Pulse Rate:  [68-116] 80 (07/30 0800) Resp:  [16-26] 18 (07/30 0800) BP: (91-144)/(39-88) 103/67 (07/30 0800) SpO2:  [93 %-99 %] 98 % (07/30 0842) FiO2 (%):  [25 %] 25 % (07/30 0842) Weight:  [197 lb 1.5 oz (89.4 kg)] 197 lb 1.5 oz (89.4 kg) (07/30 0428) HEMODYNAMICS:   VENTILATOR SETTINGS: Vent Mode: Spontaneous FiO2 (%):  [25 %] 25 % Set Rate:  [12 bmp-18 bmp] 12 bmp Vt Set:  [500 mL] 500 mL PEEP:  [5 cmH20] 5 cmH20 Pressure Support:  [12 cmH20] 12 cmH20 Plateau Pressure:  [20 cmH20] 20 cmH20 INTAKE / OUTPUT:  Intake/Output Summary (Last 24 hours) at 06/11/16 0956 Last data filed at 06/11/16 0737  Gross per 24 hour  Intake          3168.25 ml  Output             2510 ml  Net  658.25 ml    PHYSICAL EXAMINATION: Physical Examination:   VS: BP 103/67   Pulse 80   Temp 97.8 F (36.6 C) (Oral)   Resp 18   Ht 5\' 2"  (1.575 m)   Wt 197 lb 1.5 oz (89.4 kg)   SpO2 98%   BMI 36.05 kg/m   General Appearance: No distress    Neuro:without focal findings, mental status reduced.  HEENT: PERRLA, EOM intact. Pulmonary: normal breath sounds   CardiovascularNormal S1,S2.  No m/r/g.   Abdomen: Benign, Soft, non-tender. Renal:  No costovertebral tenderness  GU:  Not performed at this time. Endocrine: No evident thyromegaly. Skin:   warm, no rashes, no ecchymosis  Extremities: normal, no cyanosis, clubbing.   LABS:   LABORATORY PANEL:   CBC  Recent Labs Lab 06/11/16 0426  WBC 5.2  HGB 9.1*  HCT 27.3*  PLT 108*    Chemistries   Recent Labs Lab 06/09/16 0540  06/10/16 0417  06/11/16 0426  NA 140  --  150*  < > 150*  K 4.3  --  2.8*  < > 3.5  CL 107  --  116*  < > 115*  CO2 20*  --  26  < > 28  GLUCOSE 314*  --  263*  < > 231*  BUN 84*  --  63*  < > 52*  CREATININE 1.71*  --  0.81  < > 0.56  CALCIUM 8.6*  --  8.9  < > 9.4  MG 1.5*  < > 1.8  --   --   PHOS 4.9*  < > 2.2*  --   --   AST 29  --   --   --   --   ALT 43  --   --   --   --   ALKPHOS 110  --   --   --   --   BILITOT 0.7  --   --   --   --   < > = values in this interval not displayed.   Recent Labs Lab 06/10/16 1132 06/10/16 1528 06/10/16 1937 06/11/16 0000 06/11/16 0355 06/11/16 0724  GLUCAP 285* 320* 252* 215* 211* 188*    Recent Labs Lab 06/08/16 0915 06/10/16 0500 06/11/16 0500  PHART 7.24* 7.39 7.52*  PCO2ART 52* 42 33  PO2ART 97 73* 136*    Recent Labs Lab 06/07/16 2231 06/09/16 0540  AST 25 29  ALT 33 43  ALKPHOS 72 110  BILITOT 0.6 0.7  ALBUMIN 1.6* 2.0*    Cardiac Enzymes  Recent Labs Lab 06/08/16 0732  TROPONINI 0.25*    RADIOLOGY:  Dg Chest 1 View  Result Date: 06/11/2016 CLINICAL DATA:  Dyspnea.  Breast cancer. EXAM: CHEST 1 VIEW COMPARISON:  06/10/2016. FINDINGS: Support tubes and apparatus are stable. Rounded opacity at the LEFT base is unchanged. No pneumothorax. Stable heart size. IMPRESSION: Persistent LEFT base infiltrate.  No improvement since priors. Electronically Signed    By: Staci Righter M.D.   On: 06/11/2016 07:19  Dg Chest 1 View  Result Date: 06/10/2016 CLINICAL DATA:  Shortness of breath EXAM: CHEST 1 VIEW COMPARISON:  June 08, 2016 FINDINGS: The ETT is in good position as is the left central line. Stable focal opacity in the left lung base. No other interval changes or acute abnormalities. IMPRESSION: Stable support apparatus. Stable rounded left basilar focal infiltrate. Recommend follow-up to resolution. Electronically Signed   By: Dorise Bullion III M.D   On: 06/10/2016  07:16      --Marda Stalker, MD.  ICU Pager: [phone] [facility] Office Number: [phone]  Patricia Pesa, M.D.  Vilinda Boehringer, M.D.  Merton Border, M.D  06/11/2016   Critical Care Attestation.  I have personally obtained a history, examined the patient, evaluated laboratory and imaging results, formulated the assessment and plan and placed orders. The Patient requires high complexity decision making for assessment and support, frequent evaluation and titration of therapies, application of advanced monitoring technologies and extensive interpretation of multiple databases. The patient has critical illness that could lead imminently to failure of 1 or more organ systems and requires the highest level of physician preparedness to intervene.  Critical Care Time devoted to patient care services described in this note is 45 minutes and is exclusive of time spent in procedures.

## 2016-06-12 ENCOUNTER — Inpatient Hospital Stay: Payer: Medicare Other

## 2016-06-12 DIAGNOSIS — J9622 Acute and chronic respiratory failure with hypercapnia: Secondary | ICD-10-CM

## 2016-06-12 DIAGNOSIS — I471 Supraventricular tachycardia: Secondary | ICD-10-CM

## 2016-06-12 DIAGNOSIS — J189 Pneumonia, unspecified organism: Secondary | ICD-10-CM

## 2016-06-12 DIAGNOSIS — J984 Other disorders of lung: Secondary | ICD-10-CM

## 2016-06-12 DIAGNOSIS — J9621 Acute and chronic respiratory failure with hypoxia: Secondary | ICD-10-CM

## 2016-06-12 LAB — BASIC METABOLIC PANEL
ANION GAP: 5 (ref 5–15)
ANION GAP: 7 (ref 5–15)
BUN: 43 mg/dL — AB (ref 6–20)
BUN: 34 mg/dL — ABNORMAL HIGH (ref 6–20)
CALCIUM: 9.2 mg/dL (ref 8.9–10.3)
CALCIUM: 9.1 mg/dL (ref 8.9–10.3)
CO2: 30 mmol/L (ref 22–32)
CO2: 29 mmol/L (ref 22–32)
CREATININE: 0.38 mg/dL — AB (ref 0.44–1.00)
CREATININE: 0.37 mg/dL — AB (ref 0.44–1.00)
Chloride: 113 mmol/L — ABNORMAL HIGH (ref 101–111)
Chloride: 111 mmol/L (ref 101–111)
GFR calc Af Amer: >60 mL/min (ref >60)
GLUCOSE: 216 mg/dL — AB (ref 65–99)
GLUCOSE: 186 mg/dL — AB (ref 65–99)
Potassium: 2.8 mmol/L — ABNORMAL LOW (ref 3.5–5.1)
Potassium: 3.5 mmol/L (ref 3.5–5.1)
Sodium: 148 mmol/L — ABNORMAL HIGH (ref 135–145)
Sodium: 147 mmol/L — ABNORMAL HIGH (ref 135–145)

## 2016-06-12 LAB — ACID FAST SMEAR (AFB): ACID FAST SMEAR - AFSCU2: NEGATIVE

## 2016-06-12 LAB — GLUCOSE, CAPILLARY
Glucose-Capillary: 155 mg/dL — ABNORMAL HIGH (ref 65–99)
Glucose-Capillary: 171 mg/dL — ABNORMAL HIGH (ref 65–99)
Glucose-Capillary: 171 mg/dL — ABNORMAL HIGH (ref 65–99)
Glucose-Capillary: 180 mg/dL — ABNORMAL HIGH (ref 65–99)
Glucose-Capillary: 206 mg/dL — ABNORMAL HIGH (ref 65–99)
Glucose-Capillary: 210 mg/dL — ABNORMAL HIGH (ref 65–99)
Glucose-Capillary: 215 mg/dL — ABNORMAL HIGH (ref 65–99)

## 2016-06-12 LAB — PHOSPHORUS
PHOSPHORUS: 1.4 mg/dL — AB (ref 2.5–4.6)
Phosphorus: 2.9 mg/dL (ref 2.5–4.6)

## 2016-06-12 LAB — BLOOD GAS, ARTERIAL
Acid-Base Excess: 7.2 mmol/L — ABNORMAL HIGH (ref 0.0–3.0)
Bicarbonate: 32 mEq/L — ABNORMAL HIGH (ref 21.0–28.0)
FIO2: 0.25
O2 Saturation: 97.8 %
PEEP/CPAP: 5 cmH2O
PRESSURE SUPPORT: 12 cmH2O
Patient temperature: 37
pCO2 arterial: 46 mmHg (ref 32.0–48.0)
pH, Arterial: 7.45 (ref 7.350–7.450)
pO2, Arterial: 96 mmHg (ref 83.0–108.0)

## 2016-06-12 LAB — CBC
HCT: 26.8 % — ABNORMAL LOW (ref 35.0–47.0)
Hemoglobin: 8.9 g/dL — ABNORMAL LOW (ref 12.0–16.0)
MCH: 31.3 pg (ref 26.0–34.0)
MCHC: 33.1 g/dL (ref 32.0–36.0)
MCV: 94.6 fL (ref 80.0–100.0)
PLATELETS: 100 10*3/uL — AB (ref 150–440)
RBC: 2.83 MIL/uL — ABNORMAL LOW (ref 3.80–5.20)
RDW: 16 % — AB (ref 11.5–14.5)
WBC: 5.6 10*3/uL (ref 3.6–11.0)

## 2016-06-12 LAB — ACID FAST SMEAR (AFB, MYCOBACTERIA)

## 2016-06-12 LAB — MAGNESIUM
Magnesium: 1.4 mg/dL — ABNORMAL LOW (ref 1.7–2.4)
Magnesium: 1.8 mg/dL (ref 1.7–2.4)

## 2016-06-12 MED ORDER — MAGNESIUM SULFATE 2 GM/50ML IV SOLN
2.0000 g | Freq: Once | INTRAVENOUS | Status: AC
  Administered 2016-06-12: 2 g via INTRAVENOUS
  Filled 2016-06-12: qty 50

## 2016-06-12 MED ORDER — POTASSIUM CHLORIDE 20 MEQ/15ML (10%) PO SOLN
40.0000 meq | ORAL | Status: AC
  Administered 2016-06-12 (×2): 40 meq
  Filled 2016-06-12 (×2): qty 30

## 2016-06-12 MED ORDER — CEFTAZIDIME 2 G IJ SOLR
2.0000 g | Freq: Three times a day (TID) | INTRAMUSCULAR | Status: DC
  Administered 2016-06-12 – 2016-06-22 (×30): 2 g via INTRAVENOUS
  Filled 2016-06-12 (×32): qty 2

## 2016-06-12 MED ORDER — ALBUTEROL SULFATE (2.5 MG/3ML) 0.083% IN NEBU
2.5000 mg | INHALATION_SOLUTION | RESPIRATORY_TRACT | Status: DC | PRN
  Administered 2016-06-12 – 2016-06-15 (×2): 2.5 mg via RESPIRATORY_TRACT
  Filled 2016-06-12 (×2): qty 3

## 2016-06-12 MED ORDER — POTASSIUM PHOSPHATES 15 MMOLE/5ML IV SOLN
40.0000 meq | Freq: Once | INTRAVENOUS | Status: AC
  Administered 2016-06-12: 40 meq via INTRAVENOUS
  Filled 2016-06-12: qty 9.09

## 2016-06-12 MED ORDER — FAMOTIDINE 40 MG/5ML PO SUSR
20.0000 mg | Freq: Every day | ORAL | Status: DC
  Administered 2016-06-12 – 2016-06-26 (×17): 20 mg
  Filled 2016-06-12 (×15): qty 2.5

## 2016-06-12 MED ORDER — CEFTAZIDIME 2 G IJ SOLR
2.0000 g | Freq: Three times a day (TID) | INTRAMUSCULAR | Status: DC
  Filled 2016-06-12 (×3): qty 2

## 2016-06-12 MED ORDER — HYDROCORTISONE NA SUCCINATE PF 100 MG IJ SOLR
50.0000 mg | Freq: Two times a day (BID) | INTRAMUSCULAR | Status: DC
  Administered 2016-06-12 – 2016-06-13 (×2): 50 mg via INTRAVENOUS
  Filled 2016-06-12 (×2): qty 2

## 2016-06-12 NOTE — Progress Notes (Signed)
Dr. Leonidas Romberg made aware of low magnesium and phosphorus levels this morning. Physician stated that he will put electrolyte replacement orders in.

## 2016-06-12 NOTE — Progress Notes (Addendum)
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   Review of Systems:  Unable to assess pt is very lethargic   MAJOR EVENTS/TEST RESULTS: Bronchoscopy and intubation 06/08/16 Left subclavian TLC 7/27 Pt extubated 7/31  VITAL SIGNS: Temp:  [97.6 F (36.4 C)-98.2 F (36.8 C)] 98.2 F (36.8 C) (07/31 0800) Pulse Rate:  [58-147] 76 (07/31 1000) Resp:  [12-27] 27 (07/31 1000) BP: (94-185)/(31-167) 124/55 (07/31 1000) SpO2:  [93 %-100 %] 100 % (07/31 1000) FiO2 (%):  [25 %] 25 % (07/31 0809) Weight:  [193 lb 2 oz (87.6 kg)] 193 lb 2 oz (87.6 kg) (07/31 0232)  PHYSICAL EXAMINATION:  VS: BP 124/55   Pulse 76   Temp 98.2 F (36.8 C) (Oral)   Resp (!) 27   Ht 5\' 2"  (1.575 m)   Wt 193 lb 2 oz (87.6 kg)   SpO2 100%   BMI 35.32 kg/m   General Appearance: chronically ill appearing female Neuro: attempting to follow commands however very lethargic, bilateral pupil 3 mm reactive  HEENT: supple, no JVD Pulmonary: expiratory wheezing throughout, even, non labored, weak cough Cardiovascular: s1s2, rrr, No M/R/G Abdomen: Benign, +BS x4, soft, non tender, non distended Skin: Warm, unstageable sacral pressure ulcer Extremities: normal, no cyanosis, clubbing.  RADIOLOGY:  Dg Chest 1 View  Result Date: 06/12/2016 CLINICAL DATA:  70 year old female with dyspnea. Diabetes. Breast cancer. Sleep apnea. Subsequent encounter. EXAM: CHEST 1 VIEW COMPARISON:  06/11/2016 chest x-ray.  03/08/2016 chest CT. FINDINGS: Portable exam with  rotation to the right. Left central line tip unchanged appearing to project over the level of the proximal superior vena cava. Endotracheal tube tip 3.6 cm above the carina. No gross pneumothorax. Persistent consolidation left lung base possibly with cavitation. The CT detected multi lobar airspace disease is not appreciated on the present plain film exam. If further delineation of lung parenchyma is clinically desired chest CT may be considered. Follow-up until clearance by plain film examination will be necessary to exclude underlying mass. Mild prominence mediastinal and cardiac silhouette is stable. No acute osseous abnormality. IMPRESSION: Persistent consolidation left lung base possibly with cavitation. The 03/08/2016 CT detected multi lobar airspace disease is not appreciated on the present plain film exam. Electronically Signed   By: Genia Del M.D.   On: 06/12/2016 07:51  Dg Chest 1 View  Result Date: 06/11/2016 CLINICAL DATA:  Dyspnea.  Breast cancer. EXAM: CHEST 1 VIEW COMPARISON:  06/10/2016. FINDINGS: Support tubes and apparatus are stable. Rounded opacity at the LEFT base is unchanged. No pneumothorax. Stable heart size. IMPRESSION: Persistent LEFT base infiltrate.  No improvement since priors. Electronically Signed   By: Staci Righter M.D.   On: 06/11/2016 07:19  ASSESSMENT/PLAN   PULMONARY A: Left lower lobe pneumonia with pseudomonas/cavitary lung lesion with acute respiratory failure OSA/Chronic hypercapnic respiratory failure COPD, does not appear to be in exacerbation at this time  P: High risk for reintubation Continue Voriconazole for aspergillus pneumonia  Rule out tuberculosis, given her history of immunosuppression on chronic decadron for COPD s/p bronch with washings 7/27-- AFB STILL PENDING!!. Continue  empiric abx for pneumonia.  Supplemental O2 as needed to maintain O2 sats 88% to 94% Bipap prn and qhs Pulmonary Hygiene  Continue scheduled Proventil, prn Proventil,  and Solucortef Follow with Dr. Raul Del outpatient for COPD/OSA  CARDIOVASCULAR A: Hypotension due to sepsis due to UTI and pneumonia-resolved Possible adrenal insufficiency (chronic decadron)  Hx: Essential hypertension P: Telemetry monitoring  Maintain map >65  RENAL A: Acute kidney injury-resolved Hypernatremia P: Continue Free water replacement Trend BMP's Replace Potassium, Phosphorous, and Magnesium today 7/31 Monitor urinary output  GASTROINTESTINAL A: No acute issues  P: Continue tube feedings for now Nothing by mouth for now   HEMATOLOGIC A: Breast cancer status post chemotherapy/radiation Anemia P: Heparin for VTE prophylaxis Monitor for s/sx of bleeding Transfuse for hgb <7  INFECTIOUS A: Left lower lobe pneumonia Rule out Tuberculosis P: Continue Vori/abx Trend WBC and monitor fever curve Airborne and Contact Precautions  Micro/culture results:  MRSA screen 7/27: Negative Bronch AFB 7/27: Pending Bronch Sputum 7/27: pending.  BCx2 7/26:  UC 7/26: ESBL E. Coli Bronch: 03/07/16-- aspergillus.   Antibiotics: Vancomycin x1 Zosyn x1 Ciprofloxacin>>d/c Metroidazole>>d/c Voriconazole 7/27>> Meropenem 7/29>>  ENDOCRINE A: Diabetes mellitus. Hx: Rheumatoid arthritis, Hypothyroidism Has been on humira in the past, but has not used in about 6 months.  CBG q4hrs SSI and Lantus Continue outpatient synthroid  NEUROLOGIC Dementia/Acute encephalopathy Avoid sedating medications Frequent reorientation Lights on during day   --Pt high risk for reintubation if she requires reintubation she will need a tracheostomy  Marda Stalker, [place] Pager [phone] (please enter 7 digits) [place] Pager [phone] (please enter 7 digits)    PCCM ATTENDING ATTESTATION:  I have evaluated patient with ANP Patria Mane, reviewed database in its entirety and discussed care plan in detail. In addition, this patient was  discussed on multidisciplinary rounds.   Important exam findings: RASS -1, + F/C Passed SBT No wheezes Reg, no M Obese, NABS Ext warm, trace symmetric edema  CXR: NSC LLL density EKG: short PR interval, no ischemic changes   Major problems addressed by PCCM team: Acute on chronic respiratory failure OSA/OHS LLL cavitary PNA - AFB negative. Aspergilloma possible, cavitary pseudomonas PNA possible Hypotension, resolved PSVT, controlled on diltiazem Chronic steroids - concern for adrenal insufficiency AKI, resolved Chronic G tube  PLAN/REC: Extubated this AM to PRN BiPAP. High risk of re-intubation. Will need trach if re-intubated Continue ceftaz and voriconazole Cont diltiazem gtt Wean hydrocortisone as permitted by BP Cont TFs per G tube   CCM time: 45 mins The above time includes time spent in consultation with patient and/or family members and reviewing care plan on multidisciplinary rounds  Merton Border, MD PCCM service Mobile [phone] Pager [phone]

## 2016-06-12 NOTE — Progress Notes (Signed)
Key Points: Use following P&T approved IV to PO antibiotic change policy.  Description contains the criteria that are approved Note: Policy Excludes:  Esophagectomy patientsPHARMACIST - PHYSICIAN COMMUNICATION DR:   Alva Garnet CONCERNING: IV to Oral Route Change Policy  RECOMMENDATION: This patient is receiving famotidine by the intravenous route.  Based on criteria approved by the Pharmacy and Therapeutics Committee, the intravenous medication(s) is/are being converted to the equivalent oral dose form(s).   DESCRIPTION: These criteria include:  The patient is eating (either orally or via tube) and/or has been taking other orally administered medications for a least 24 hours  The patient has no evidence of active gastrointestinal bleeding or impaired GI absorption (gastrectomy, short bowel, patient on TNA or NPO).  If you have questions about this conversion, please contact the Pharmacy Department  []   [phone] )  [facility] [x]   [phone] )  [facility] []   [phone] )  [facility] []   [phone] )  [facility] []   [phone] )  [place], [facility] 06/12/2016 12:03 PM

## 2016-06-12 NOTE — Progress Notes (Signed)
Chaplain rounded the unit to provide a compassionate presence and support to the patient.  The patient appeared to be sleeping and a silent prayer was said. Chaplain Fraya Ueda [phone] 

## 2016-06-12 NOTE — Progress Notes (Addendum)
[facility] ADULT ICU REPLACEMENT PROTOCOL FOR AM LAB REPLACEMENT ONLY  The patient does apply for the [facility] Adult ICU Electrolyte Replacment Protocol based on the criteria listed below:   1. Is GFR >/= 40 ml/min? Yes.    Patient's GFR today is >60 2. Is urine output >/= 0.5 ml/kg/hr for the last 6 hours? Yes.   Patient's UOP is 0.63 ml/kg/hr 3. Is BUN < 60 mg/dL? Yes.    Patient's BUN today is 43 4. Abnormal electrolyte(s):  K - 2.8 5. Ordered repletion with: PER PROTOCOL 6. If a panic level lab has been reported, has the CCM MD in charge been notified? Yes.  .   Physician:  Dr. Corrie Dandy  Orland Penman 06/12/2016 5:32 AM

## 2016-06-12 NOTE — Consult Note (Signed)
[place] Nurse wound follow up Wound type:Sacral pressure ulcer  Discussed with bedside RN.  Dressing changed this morning secondary to soilage.  Will see pt tomorrow early and assess wound.  Per RN, wound base is pink with area of necrotic tissue in the middle.  Moderate drainage.   Thank you,  Dorna Bloom BSN, RN, [facility]

## 2016-06-12 NOTE — Progress Notes (Signed)
Pharmacy Antibiotic Note  AYLINA CHIZMAR is a 70 y.o. female admitted on 06/07/2016 with ESBL UTI and LLL cavitary lesion.  Pharmacy has been consulted for meropenem dosing.  Plan: Will continue meropenem 1 g iv q 8 hours for now. Will discuss deescalating to ceftazidime or cefepime for Pseudomonas as ESBL and Klebsiella in urine may represent colonization.   Height: 5\' 2"  (157.5 cm) Weight: 193 lb 2 oz (87.6 kg) IBW/kg (Calculated) : 50.1  Temp (24hrs), Avg:97.9 F (36.6 C), Min:97.6 F (36.4 C), Max:98.2 F (36.8 C)   Recent Labs Lab 06/07/16 2106 06/07/16 2109  06/08/16 0100  06/09/16 0540 06/10/16 0417 06/10/16 1933 06/11/16 0426 06/12/16 0442  WBC 7.8  --   --   --   --  8.8 6.7  --  5.2 5.6  CREATININE  --   --   < >  --   < > 1.71* 0.81 0.66 0.56 0.38*  LATICACIDVEN  --  1.0  --  0.9  --   --   --   --   --   --   < > = values in this interval not displayed.  Estimated Creatinine Clearance: 69.2 mL/min (by C-G formula based on SCr of 0.8 mg/dL).    Allergies  Allergen Reactions  . Other Hives and Rash    Shellfish. Shellfish Pt reports no allergic reaction to iodine or betadine.  . Shellfish Allergy Hives and Rash    Microbiology results: 7/26 BCx: NGTD x 2 7/26 UCx: ESBL positive E.coli, Klebsiella pneumoniae pan-resistant 7/27 BAL: Pseudomonas aeruginosa pan-sensitive 7/27 MRSA PCR: negative  Thank you for allowing pharmacy to be a part of this patient's care.  Ulice Dash D, PharmD Clinical Pharmacist 06/12/2016 12:04 PM

## 2016-06-12 NOTE — Progress Notes (Signed)
Nutrition Follow-up  DOCUMENTATION CODES:   Obesity unspecified  INTERVENTION:  -TF: recommend continuing current TF regimen while on vent support, continue to assess -Recommend addition of MVI as current TF goal does not meet 100% RDIs, pt also with stage III pressure ulcer.    NUTRITION DIAGNOSIS:   Inadequate oral intake related to acute illness as evidenced by NPO status.  Being addressed via TF  GOAL:   Provide needs based on ASPEN/SCCM guidelines  MONITOR:   TF tolerance, Vent status, Weight trends, Labs  REASON FOR ASSESSMENT:   Consult Enteral/tube feeding initiation and management  ASSESSMENT:    70 yo female admitted with sepsis, respiratory failure with LLL pneumonia and cavitary lesions; CT abdomen with acute diverticulitis as well. Pt with hx of breast cancer s/p radiation therapy completed March 2017. Pt also hx of PEG tube  Pt remains on vent support, tolerating Vital High Protein at rate of 50 ml/hr, Prostat daily, free water 250 mL q 4 hours. Per report, pt had been taking po at home prior to admission, husband reporting to staff that pt eating eggs and bacon at breakfast. Unsure at present if pt had been using PEG tube as well  Diet Order:   NPO, TF  Skin:  Wound (see comment) (unstageable and stage III pressure ulcer on sacrum)  Last BM:  7/31   Labs: sodium 148, potassium 2.8, phosphorus 1.4, magnesium 1.4 (electrolytes supplemented this AM)  Meds: D5 at 50 ml/hr, diprivan discontinued  Height:   Ht Readings from Last 1 Encounters:  06/08/16 5\' 2"  (1.575 m)    Weight:   Wt Readings from Last 1 Encounters:  06/12/16 193 lb 2 oz (87.6 kg)   Filed Weights   06/10/16 0424 06/11/16 0428 06/12/16 0232  Weight: 195 lb 5.2 oz (88.6 kg) 197 lb 1.5 oz (89.4 kg) 193 lb 2 oz (87.6 kg)    Ideal Body Weight:     BMI:  Body mass index is 35.32 kg/m.  Estimated Nutritional Needs:   Kcal:  [id number] kcals   Protein:  >/= 100 g  Fluid:  >/=  1.5 L  EDUCATION NEEDS:   No education needs identified at this time  [place], [place], [place] [phone] Pager  [phone] Weekend/On-Call Pager

## 2016-06-12 NOTE — Progress Notes (Signed)
Inpatient Diabetes Program Recommendations  AACE/ADA: New Consensus Statement on Inpatient Glycemic Control (2015)  Target Ranges:  Prepandial:   less than 140 mg/dL      Peak postprandial:   less than 180 mg/dL (1-2 hours)      Critically ill patients:  140 - 180 mg/dL   Results for Ebony [name], Ebony [name] (MRN [id number]) as of 06/12/2016 10:07  Ref. Range 06/11/2016 00:00 06/11/2016 03:55 06/11/2016 07:24 06/11/2016 11:41 06/11/2016 16:20 06/11/2016 20:07  Glucose-Capillary Latest Ref Range: 65 - 99 mg/dL 215 (H) 211 (H) 188 (H) 196 (H) 148 (H) 207 (H)   Results for Ebony [name], Ebony [name] (MRN [id number]) as of 06/12/2016 10:07  Ref. Range 06/12/2016 00:08 06/12/2016 04:28 06/12/2016 07:38  Glucose-Capillary Latest Ref Range: 65 - 99 mg/dL 206 (H) 215 (H) 180 (H)    Review of Glycemic Control  Diabetes history: DM2  Outpatient Diabetes medications: Glipizide 5mg  daily, Levemir 85 units BID, Humalog 38-48 units TID with meals, Metformin 1000 mg BID  Current orders: Novolog 0-20 units Q4H     Novolog 6 units Q4 hours     Lantus 18 units QHS     MD- Please consider the following in-hospital insulin adjustments:  1. Increase Lantus to 25 units QHS (40% increase of dose- Patient takes extremely large dose of Levemir at home)  2. Increase Novolog tube feed coverage to: Novolog 8 units Q4 hours (hold if tube feedings held for any reason)     --Will follow patient during hospitalization--  Wyn Quaker RN, MSN, CDE Diabetes Coordinator Inpatient Glycemic Control Team Team Pager: [phone] (8a-5p)

## 2016-06-12 NOTE — Care Management Note (Signed)
Case Management Note  Patient Details  Name: CADYNCE WOLLMAN MRN: [id number] Date of Birth: 28-Nov-1945  Subjective/Objective:                  Received call from patient's husband. He states Angelina Ok NP [phone] - PCP in [place]- suggested hospice for patient along with PT. He states Mendel Ryder never talked to him about end-of-life care though.  [facility] started seeing patient. [facility] asked patient if she wanted to stay home or go to the hospital- husband said they "got their wires crossed and gave her about 17 prescriptions when she started with hospice".  She was eating PO food- "good". She was talking on the phone, reading her Bible, etc. However patient is confined to bed. "She was doing fine". She then "took diarrhea real bad and it stopped- then constipated. "Hospice changed her to Morphine after about 7 days to treat her constipation along with laxatives (12 days total without BM)". He says he kept asking for PT services and they never came out- "she kept getting weaker and weaker".  Husband decided to call EMS Wednesday as "she just kept getting worse".  She has been approved for Medicaid- through Aug. 31, 2017 according to one letter. Another letter says she was disapproved for Medicaid. Another letter on Saturday (latest letter) stating she "has been approved". He is going to call Medicaid office to clarify. Medicaid will still not cover PT services.  He states he has no help at home- "it's only him". He wants everything done to keep her alive. He states hospice "never talked to him about end of life or did [place]". He said that "when she gets a lower chance of survival he will consider end of life care". He said "50/50 chance is a good chance to live to me and that's what the doctor told me".  She has hospital bed. O2 at one liter through Hospice.  Discharged last visit to [facility] 05/24/16. He agrees to that again "if she can recover".   "When she was at [facility] she was  getting up to wheelchair by sliding board and then came home with no PT  and all those pills"-per husband. She took 5 weeks radiation at [facility] center back in November 2016- January 2017- stopped because "there was no sign of cancer".   Action/Plan:    I explained that some insurances will not cover PT along with end-of-life care. He said he had never been told that. I explained that his PCP may not have been aware of that either and the hospice agency may still be trying to get it approved.  Right now he wants everything done. I have started the "what if" conversation and advised him to talk to his PCP regarding her order for hospice home care- he agreed. He is hopeful. He agrees to [facility] (after 21 days) if medically necessary.     Expected Discharge Date:                  Expected Discharge Plan:     In-House Referral:     Discharge planning Services  CM Consult  Post Acute Care Choice:    Choice offered to:  Spouse  DME Arranged:    DME Agency:     HH Arranged:    [place] Agency:     Status of Service:  In process, will continue to follow  If discussed at Long Length of Stay Meetings, dates discussed:  Additional Comments:  Marshell Garfinkel, RN 06/12/2016, 9:07 AM

## 2016-06-12 NOTE — Progress Notes (Signed)
Pharmacy Antibiotic Note  Ebony [name] is a 70 y.o. female admitted on 06/07/2016 with ESBL UTI and LLL cavitary lesion.  Pharmacy has been consulted for meropenem dosing.  Plan: After discussion with Dr. Alva Garnet, will change abx to ceftazidime for Pseudomonas. Ceftazidime 2 g iv q 8 hours.   Height: 5\' 2"  (157.5 cm) Weight: 193 lb 2 oz (87.6 kg) IBW/kg (Calculated) : 50.1  Temp (24hrs), Avg:97.9 F (36.6 C), Min:97.6 F (36.4 C), Max:98.2 F (36.8 C)   Recent Labs Lab 06/07/16 2106 06/07/16 2109  06/08/16 0100  06/09/16 0540 06/10/16 0417 06/10/16 1933 06/11/16 0426 06/12/16 0442  WBC 7.8  --   --   --   --  8.8 6.7  --  5.2 5.6  CREATININE  --   --   < >  --   < > 1.71* 0.81 0.66 0.56 0.38*  LATICACIDVEN  --  1.0  --  0.9  --   --   --   --   --   --   < > = values in this interval not displayed.  Estimated Creatinine Clearance: 69.2 mL/min (by C-G formula based on SCr of 0.8 mg/dL).    Allergies  Allergen Reactions  . Other Hives and Rash    Shellfish. Shellfish Pt reports no allergic reaction to iodine or betadine.  . Shellfish Allergy Hives and Rash    Microbiology results: 7/26 BCx: NGTD x 2 7/26 UCx: ESBL positive E.coli, Klebsiella pneumoniae pan-resistant 7/27 BAL: Pseudomonas aeruginosa pan-sensitive 7/27 MRSA PCR: negative  Thank you for allowing pharmacy to be a part of this patient's care.  Napoleon Form, PharmD Clinical Pharmacist 06/12/2016 2:31 PM

## 2016-06-12 NOTE — Progress Notes (Signed)
Pt. Was  Suctioned prior to extubation for a small amount of thick brown secretions. She does have a strong cough. Per Dr. Alva Garnet order, she was extubated and put on 2 L nasal cannula. She has a whisper voice and has no stridor.Sa02 is 100%.

## 2016-06-13 ENCOUNTER — Inpatient Hospital Stay: Payer: Medicare Other | Admitting: Oncology

## 2016-06-13 ENCOUNTER — Inpatient Hospital Stay: Payer: Medicare Other

## 2016-06-13 DIAGNOSIS — J151 Pneumonia due to Pseudomonas: Secondary | ICD-10-CM

## 2016-06-13 LAB — CBC
HEMATOCRIT: 25.6 % — AB (ref 35.0–47.0)
Hemoglobin: 8.6 g/dL — ABNORMAL LOW (ref 12.0–16.0)
MCH: 31.2 pg (ref 26.0–34.0)
MCHC: 33.7 g/dL (ref 32.0–36.0)
MCV: 92.6 fL (ref 80.0–100.0)
PLATELETS: 107 10*3/uL — AB (ref 150–440)
RBC: 2.76 MIL/uL — AB (ref 3.80–5.20)
RDW: 15.9 % — ABNORMAL HIGH (ref 11.5–14.5)
WBC: 6.3 10*3/uL (ref 3.6–11.0)

## 2016-06-13 LAB — CULTURE, BLOOD (ROUTINE X 2)
CULTURE: NO GROWTH
CULTURE: NO GROWTH

## 2016-06-13 LAB — URINALYSIS COMPLETE WITH MICROSCOPIC (ARMC ONLY)
BILIRUBIN URINE: NEGATIVE
KETONES UR: NEGATIVE mg/dL
NITRITE: NEGATIVE
Protein, ur: 30 mg/dL — AB
SPECIFIC GRAVITY, URINE: 1.025 (ref 1.005–1.030)
Squamous Epithelial / LPF: NONE SEEN
pH: 6 (ref 5.0–8.0)

## 2016-06-13 LAB — GLUCOSE, CAPILLARY
Glucose-Capillary: 120 mg/dL — ABNORMAL HIGH (ref 65–99)
Glucose-Capillary: 134 mg/dL — ABNORMAL HIGH (ref 65–99)
Glucose-Capillary: 151 mg/dL — ABNORMAL HIGH (ref 65–99)
Glucose-Capillary: 175 mg/dL — ABNORMAL HIGH (ref 65–99)
Glucose-Capillary: 182 mg/dL — ABNORMAL HIGH (ref 65–99)
Glucose-Capillary: 223 mg/dL — ABNORMAL HIGH (ref 65–99)
Glucose-Capillary: 224 mg/dL — ABNORMAL HIGH (ref 65–99)

## 2016-06-13 LAB — BASIC METABOLIC PANEL
Anion gap: 5 (ref 5–15)
BUN: 27 mg/dL — AB (ref 6–20)
CHLORIDE: 109 mmol/L (ref 101–111)
CO2: 32 mmol/L (ref 22–32)
Calcium: 9 mg/dL (ref 8.9–10.3)
Glucose, Bld: 179 mg/dL — ABNORMAL HIGH (ref 65–99)
POTASSIUM: 3.4 mmol/L — AB (ref 3.5–5.1)
SODIUM: 146 mmol/L — AB (ref 135–145)

## 2016-06-13 MED ORDER — ACETAMINOPHEN 325 MG PO TABS
650.0000 mg | ORAL_TABLET | Freq: Four times a day (QID) | ORAL | Status: DC | PRN
  Administered 2016-06-13 – 2016-06-23 (×5): 650 mg
  Filled 2016-06-13 (×6): qty 2

## 2016-06-13 MED ORDER — PRO-STAT SUGAR FREE PO LIQD
30.0000 mL | Freq: Two times a day (BID) | ORAL | Status: DC
  Administered 2016-06-13 – 2016-06-26 (×26): 30 mL

## 2016-06-13 MED ORDER — POTASSIUM CHLORIDE 2 MEQ/ML IV SOLN
INTRAVENOUS | Status: DC
  Administered 2016-06-13 – 2016-06-14 (×2): via INTRAVENOUS
  Filled 2016-06-13 (×4): qty 1000

## 2016-06-13 MED ORDER — DILTIAZEM HCL 30 MG PO TABS: 30.0000 mg | ORAL_TABLET | Freq: Four times a day (QID) | ORAL | Status: DC

## 2016-06-13 MED ORDER — HYDROCORTISONE NA SUCCINATE PF 100 MG IJ SOLR
25.0000 mg | Freq: Two times a day (BID) | INTRAMUSCULAR | Status: DC
  Administered 2016-06-13 – 2016-06-14 (×2): 25 mg via INTRAVENOUS
  Filled 2016-06-13 (×2): qty 2

## 2016-06-13 MED ORDER — LEVOTHYROXINE SODIUM 50 MCG PO TABS
50.0000 ug | ORAL_TABLET | Freq: Every day | ORAL | Status: DC
  Administered 2016-06-14 – 2016-06-26 (×13): 50 ug
  Filled 2016-06-13 (×18): qty 1

## 2016-06-13 MED ORDER — DILTIAZEM 12 MG/ML ORAL SUSPENSION
30.0000 mg | Freq: Four times a day (QID) | ORAL | Status: DC
  Administered 2016-06-13 – 2016-06-26 (×53): 30 mg
  Filled 2016-06-13 (×58): qty 3

## 2016-06-13 MED ORDER — LEVOTHYROXINE SODIUM 200 MCG PO TABS: 200.0000 ug | ORAL_TABLET | Freq: Every day | ORAL | Status: DC

## 2016-06-13 MED ORDER — DILTIAZEM 12 MG/ML ORAL SUSPENSION
30.0000 mg | Freq: Four times a day (QID) | ORAL | Status: DC
  Filled 2016-06-13: qty 3

## 2016-06-13 MED ORDER — POTASSIUM CHLORIDE 20 MEQ/15ML (10%) PO SOLN
40.0000 meq | Freq: Once | ORAL | Status: AC
  Administered 2016-06-13: 40 meq
  Filled 2016-06-13: qty 30

## 2016-06-13 MED ORDER — LEVOTHYROXINE SODIUM 200 MCG PO TABS
200.0000 ug | ORAL_TABLET | Freq: Every day | ORAL | Status: DC
  Administered 2016-06-14: 200 ug via ORAL

## 2016-06-13 MED ORDER — GLUCERNA 1.2 CAL PO LIQD
1000.0000 mL | ORAL | Status: DC
  Administered 2016-06-13 – 2016-06-16 (×4): 1000 mL

## 2016-06-13 NOTE — Consult Note (Signed)
[place] Nurse wound follow up Wound type: Nonhealing unstageable pressure injury to sacrum. Present on admission. Measurement: Total area 6 cm x4 cm with 4 cm x 3 cm area of necortic tissue in wound bed. Wound bed: adherent slough surrounded by red, bleeding, macerated, rashy tissue Drainage (amount, consistency, odor) small amount of sanguinous drainage, no odor Periwound: Stage 3 pressure injury to left sacral area  Dressing procedure/placement/frequency: Cleanse sacral wound with NS and pat gently dry.  Apply Dakin;s moist 4x4 and cover with 4x4 gauze and ABD pad.  Change daily.  Silicone border foam dressing to stage 3 buttock injury.   Discussed POC with patient and bedside nurse.   Thanks Dorna Bloom BSN, RN, [facility]

## 2016-06-13 NOTE — Progress Notes (Signed)
Pharmacy Antibiotic Note  Ebony [name] is a 70 y.o. female admitted on 06/07/2016 with ESBL UTI and LLL cavitary lesion. ESBL and Klebsiella in urine may represent colonization.  Pharmacy has been consulted for ceftazidime dosing.  Plan: Continue ceftazidime 2 g iv q 8 hours.   Height: 5\' 2"  (157.5 cm) Weight: 197 lb 15.6 oz (89.8 kg) IBW/kg (Calculated) : 50.1  Temp (24hrs), Avg:98.3 F (36.8 C), Min:97.4 F (36.3 C), Max:98.7 F (37.1 C)   Recent Labs Lab 06/07/16 2109  06/08/16 0100  06/09/16 0540 06/10/16 0417 06/10/16 1933 06/11/16 0426 06/12/16 0442 06/12/16 1714 06/13/16 0500  WBC  --   --   --   --  8.8 6.7  --  5.2 5.6  --  6.3  CREATININE  --   < >  --   < > 1.71* 0.81 0.66 0.56 0.38* 0.37* <0.30*  LATICACIDVEN 1.0  --  0.9  --   --   --   --   --   --   --   --   < > = values in this interval not displayed.  CrCl cannot be calculated (This lab value cannot be used to calculate CrCl because it is not a number: <0.30).    Allergies  Allergen Reactions  . Other Hives and Rash    Shellfish. Shellfish Pt reports no allergic reaction to iodine or betadine.  . Shellfish Allergy Hives and Rash    Microbiology results: 7/26 BCx: NG 7/26 UCx: ESBL positive E.coli, Klebsiella pneumoniae pan-resistant 7/27 BAL: Pseudomonas aeruginosa pan-sensitive 7/27 MRSA PCR: negative  Thank you for allowing pharmacy to be a part of this patient's care.  Ulice Dash D, PharmD Clinical Pharmacist 06/13/2016 10:59 AM

## 2016-06-13 NOTE — Progress Notes (Signed)
Inpatient Diabetes Program Recommendations  AACE/ADA: New Consensus Statement on Inpatient Glycemic Control (2015)  Target Ranges:  Prepandial:   less than 140 mg/dL      Peak postprandial:   less than 180 mg/dL (1-2 hours)      Critically ill patients:  140 - 180 mg/dL  Results for SAMADHI, GODING (MRN [id number]) as of 06/13/2016 10:10  Ref. Range 06/12/2016 07:38 06/12/2016 11:32 06/12/2016 15:56 06/12/2016 20:09 06/12/2016 23:55 06/13/2016 04:15 06/13/2016 07:22  Glucose-Capillary Latest Ref Range: 65 - 99 mg/dL 180 (H)  Novolog 10 units (6 TF + 4 SSI) 171 (H)  Novolog 10 units (6 TF + 4 SSI) 171 (H)  Novolog 10 units (6 TF + 4 SSI) 155 (H)  Novolog 10 units (6 TF + 4 SSI)  Lantus 18 units @ 21:34 210 (H)  Novolog 13 units (6 TF + 8 SSI) 182 (H)  Novolog 10 units (6 TF + 4 SSI) 175 (H)  Novolog 10 units (6 TF + 4 SSI)    Review of Glycemic Control  Diabetes history: DM2 Outpatient Diabetes medications: Glipizide 5mg  daily, Levemir 85 units BID, Humalog 38-48 units TID with meals, Metformin 1000 mg BID Current orders for Inpatient glycemic control: Lantus 18 units QHS, Novolog 0-20 units Q4H, Novolog 6 units Q4H for tube feeding coverage  Inpatient Diabetes Program Recommendations:  Insulin - Basal: Please consider increasing Lantus to 25 units QHS. Insulin - Tube Feeding Coverage: Please consider increasing tube feeding coverage to Novolog 8 units Q4H.  Thanks, Barnie Alderman, RN, MSN, CDE Diabetes Coordinator Inpatient Diabetes Program [phone] (Team Pager from [place] to [place]) [phone] ([facility] office) [phone] [facility] office) [phone] [facility] office)

## 2016-06-13 NOTE — Progress Notes (Signed)
Patient turned and mouth care performed Q2H this shift, switched tube feeding to Glucerna 1.2, wound care also done with one gauze soaked in Dakin's.  Patient had BM after colace given.  Tolerated .5 liter O2 most of this shift w/o s/sx distress.

## 2016-06-13 NOTE — Progress Notes (Addendum)
Nutrition Follow-up  DOCUMENTATION CODES:   Obesity unspecified  INTERVENTION:  -TF: pt s/p extubation, nutritional needs reassessed. Recommend changing to Glucerna 1.2 at goal of 65 ml/hr providing 1872 kcals, 94 g of protein and 1264 mL of free water. Recommend increasing Prostat to BID for additional 30 g of protein and 200 kcals.Pt currently receiving additional free water flushes of 250 mL q 4 hours for total of 2764 mL of free water.  Meets 100% estimated protein and calorie needs at present as well as RDIs. Continue PEPuP at this time. Continue to assess -Recommend SLP evaluation prior to attempting diet advancement   NUTRITION DIAGNOSIS:   Inadequate oral intake related to acute illness as evidenced by NPO status.  Being addressed via TF  GOAL:   Provide needs based on ASPEN/SCCM guidelines   MONITOR:   TF tolerance, Vent status, Weight trends, Labs  REASON FOR ASSESSMENT:   Consult Enteral/tube feeding initiation and management  ASSESSMENT:    70 yo female admitted with sepsis, respiratory failure with LLL pneumonia and cavitary lesions; CT abdomen with acute diverticulitis as well. Pt with hx of breast cancer s/p radiation therapy completed March 2017. Pt also hx of PEG tube  Pt s/p extubation 7/31 Tolerating Vital High Protein at rate of 50 ml/hr with Prostat daily via PEG tube  Diet Order:   NPO, TF  Skin:  Wound (see comment) (unstageable and stage III pressure ulcer on sacrum)  Last BM:  8/1   Labs:   Glucose Profile:  Recent Labs  06/13/16 0415 06/13/16 0722 06/13/16 1130  GLUCAP 182* 175* 223*    Meds: D5 at 50 ml/hr (204 kcals), ss novolog, novolog q 4 hours, lantus  Height:   Ht Readings from Last 1 Encounters:  06/08/16 5\' 2"  (1.575 m)    Weight:   Wt Readings from Last 1 Encounters:  06/13/16 197 lb 15.6 oz (89.8 kg)    Ideal Body Weight:     BMI:  Body mass index is 36.21 kg/m.  Estimated Nutritional Needs:   Kcal:   [id number] kcals   Protein:  >/= 90 g  Fluid:  >/= 1.7 L  EDUCATION NEEDS:   No education needs identified at this time  Davis, F[place], [place] [phone] Pager  [phone] Weekend/On-Call Pager

## 2016-06-13 NOTE — Progress Notes (Signed)
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 BAL lingula and LLL. Cytology negative for malignancy 07/27 CTAP: Findings worrisome for acute uncomplicated diverticulitis involving the descending colon within the left lower abdominal quadrant. No evidence of perforation or definable/drainable fluid collection on this noncontrast examination. Interval development of rather extensive consolidative wedge-shaped opacities within the left lower lobe with potential central cavitation worrisome for infection and/or aspiration 07/31 Extubated  INDWELLING DEVICES:: ETT 07/27 >> 07/31 L [place] CVL 07/27 >>   MICRO DATA: Urine 07/26 >> ESBL E coli, carbapenem resistant klebsiella Blood 07/26 >> NEG MRSA PCR 07/27 >> NEG Resp (BAL) 07/27 >> pseudomonas (pansensitive)  >> AFB smear neg  >> fungal smear neg Urine 08/21 >>   ANTIMICROBIALS:  Vanc 07/26 >> 07/27 Pip-tazo 07/26 >> 07/29 Metronidazole 07/27 >> 07/28 Cipro 07/27 >> 07/28 Voriconazole 07/27 >> 08/01 Meropenem 07/29 >> 07/31 Ceftaz 07/31 >>   SUBJECTIVE:  RASS 0, -1. + F/C. No distress. Denies pain and dyspnea   VITAL SIGNS: Temp:  [97.6 F (36.4 C)-98.2 F (36.8 C)] 98.2 F (36.8 C) (07/31 0800) Pulse Rate:  [58-147] 76 (07/31 1000) Resp:  [12-27] 27 (07/31 1000) BP: (94-185)/(31-167) 124/55 (07/31 1000) SpO2:  [93 %-100 %] 100 % (07/31 1000) FiO2 (%):  [25 %] 25 % (07/31 0809) Weight:  [193 lb 2 oz (87.6 kg)] 193 lb 2 oz (87.6 kg) (07/31 0232)  PHYSICAL EXAMINATION:    Gen: Obese, NAD Neuro: Cns intact, MAEs HEENT: NCAT Neck: well healed scar from previous trach tube. JVP cannot be assessed Pulmonary: no adventitious sounds anteriorly Cardiovascular: occasional extrasystoles, no M Abdomen: Obese, soft, NT, +BS Extremities: trace symmetric pretibial edema  BMP Latest Ref Rng & Units 06/13/2016 06/12/2016 06/12/2016  Glucose 65 - 99 mg/dL 179(H) 186(H) 216(H)  BUN 6 - 20 mg/dL 27(H) 34(H) 43(H)  Creatinine 0.44 - 1.00 mg/dL <0.30(L) 0.37(L) 0.38(L)  Sodium 135 - 145 mmol/L 146(H) 147(H) 148(H)  Potassium 3.5 - 5.1 mmol/L 3.4(L) 3.5 2.8(L)  Chloride 101 - 111 mmol/L 109 111 113(H)  CO2 22 - 32 mmol/L 32 29 30  Calcium 8.9 - 10.3 mg/dL 9.0 9.1 9.2   CBC Latest Ref Rng & Units 06/13/2016 06/12/2016 06/11/2016  WBC 3.6 - 11.0 K/uL 6.3 5.6 5.2  Hemoglobin 12.0 - 16.0 g/dL 8.6(L) 8.9(L) 9.1(L)  Hematocrit 35.0 - 47.0 % 25.6(L) 26.8(L) 27.3(L)  Platelets 150 - 440 K/uL 107(L) 100(L) 108(L)   CXR: [place] LLL opacity   ASSESSMENT/PLAN   PULMONARY A: Acute on chronic respiratory failure OSA/OHS COPD LLL cavitary PNA - suspect pseudomonas H/O aspergillus - received 3 months voriconazole  P: Monitor in ICU/SDU  Cont supplemental O2 to maintain SpO2 90-95% Cont nebulized bronchodilators Need to discuss Advanced Directives  CARDIOVASCULAR A: Hypotension, resolved PSVT P: Telemetry monitoring  Change diltiazem to enteral 08/01  RENAL A: AKI, resolved Hypernatremia - improving Mild hypervolemia P: Monitor BMET intermittently Monitor I/Os Correct electrolytes as indicated Cont free water repletion  GASTROINTESTINAL A:  Chronic dysphagia G tube present P: SUP: enteral famotidine Cont TFs  HEMATOLOGIC A: Breast cancer status post chemotherapy/radiation ICU acquired anemia  without acute blood loss P: DVT px: SQ heparin Monitor CBC intermittently Transfuse per usual guidelines  INFECTIOUS A:  Left lower lobe cavitary pneumonia due  to pseudomonas ESBL E coli in urine 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 The Pneumonia is deemed to be the primary infection causing her acute illness P: Monitor temp, WBC count Micro and abx as above Recheck UA and cx 08/01  ENDOCRINE A: DM 2 Long term systemic steroids - concern for adrenal insufficiency P: Cont SSI and Lantus Wean hydrocortisone as able  NEUROLOGIC Depression Acute encephalopathy P: Avoid sedating medications Frequent reorientation Lights on during day   --Pt high risk for reintubation if she requires reintubation she will need a tracheostomy   CCM time: 35 mins  The above time includes time spent in consultation with patient and/or family members and reviewing care plan on multidisciplinary rounds  Merton Border, MD PCCM service Mobile [phone] Pager [phone]  06/13/2016 10:48

## 2016-06-14 DIAGNOSIS — J9620 Acute and chronic respiratory failure, unspecified whether with hypoxia or hypercapnia: Secondary | ICD-10-CM

## 2016-06-14 DIAGNOSIS — R5381 Other malaise: Secondary | ICD-10-CM

## 2016-06-14 LAB — GLUCOSE, CAPILLARY
Glucose-Capillary: 129 mg/dL — ABNORMAL HIGH (ref 65–99)
Glucose-Capillary: 146 mg/dL — ABNORMAL HIGH (ref 65–99)
Glucose-Capillary: 176 mg/dL — ABNORMAL HIGH (ref 65–99)
Glucose-Capillary: 200 mg/dL — ABNORMAL HIGH (ref 65–99)
Glucose-Capillary: 206 mg/dL — ABNORMAL HIGH (ref 65–99)
Glucose-Capillary: 95 mg/dL (ref 65–99)

## 2016-06-14 MED ORDER — PREDNISONE 10 MG PO TABS
10.0000 mg | ORAL_TABLET | Freq: Every day | ORAL | Status: DC
  Administered 2016-06-14 – 2016-06-15 (×2): 10 mg
  Filled 2016-06-14 (×2): qty 1

## 2016-06-14 MED ORDER — ONDANSETRON HCL 4 MG/2ML IJ SOLN
4.0000 mg | Freq: Four times a day (QID) | INTRAMUSCULAR | Status: DC | PRN
Start: 2016-06-14 — End: 2016-06-26
  Administered 2016-06-14: 4 mg via INTRAVENOUS
  Filled 2016-06-14: qty 2

## 2016-06-14 MED ORDER — LEVOTHYROXINE SODIUM 100 MCG PO TABS
200.0000 ug | ORAL_TABLET | Freq: Every day | ORAL | Status: DC
Start: 2016-06-15 — End: 2016-06-26
  Administered 2016-06-15 – 2016-06-26 (×12): 200 ug
  Filled 2016-06-14 (×12): qty 2

## 2016-06-14 MED ORDER — DOCUSATE SODIUM 50 MG/5ML PO LIQD: 100.0000 mg | Freq: Two times a day (BID) | ORAL | Status: DC | PRN

## 2016-06-14 NOTE — Progress Notes (Signed)
Pharmacy Antibiotic Note  Ebony [name] is a 70 y.o. female admitted on 06/07/2016 with ESBL UTI and LLL cavitary lesion.  Pharmacy has been consulted for ceftazidime dosing.  Plan: Continue ceftazidime 2 g iv q 8 hours.   Height: 5\' 2"  (157.5 cm) Weight: 198 lb 13.7 oz (90.2 kg) IBW/kg (Calculated) : 50.1  Temp (24hrs), Avg:97.9 F (36.6 C), Min:97.4 F (36.3 C), Max:98.6 F (37 C)   Recent Labs Lab 06/07/16 2109  06/08/16 0100  06/09/16 0540 06/10/16 0417 06/10/16 1933 06/11/16 0426 06/12/16 0442 06/12/16 1714 06/13/16 0500  WBC  --   --   --   --  8.8 6.7  --  5.2 5.6  --  6.3  CREATININE  --   < >  --   < > 1.71* 0.81 0.66 0.56 0.38* 0.37* <0.30*  LATICACIDVEN 1.0  --  0.9  --   --   --   --   --   --   --   --   < > = values in this interval not displayed.  CrCl cannot be calculated (This lab value cannot be used to calculate CrCl because it is not a number: <0.30).    Allergies  Allergen Reactions  . Other Hives and Rash    Shellfish. Shellfish Pt reports no allergic reaction to iodine or betadine.  . Shellfish Allergy Hives and Rash    Microbiology results: 7/26 BCx: NG 7/26 UCx: ESBL positive E.coli, Klebsiella pneumoniae pan-resistant 7/27 BAL: Pseudomonas aeruginosa pan-sensitive 7/27 MRSA PCR: negative  Thank you for allowing pharmacy to be a part of this patient's care.  Napoleon Form, PharmD Clinical Pharmacist 06/14/2016 9:55 AM

## 2016-06-14 NOTE — Progress Notes (Signed)
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 BAL lingula and LLL. Cytology negative for malignancy 07/27 CTAP: Findings worrisome for acute uncomplicated diverticulitis involving the descending colon within the left lower abdominal quadrant. No evidence of perforation or definable/drainable fluid collection on this noncontrast examination. Interval development of rather extensive consolidative wedge-shaped opacities within the left lower lobe with potential central cavitation worrisome for infection and/or aspiration 07/31 Extubated 08/02 Changed to SDU status. Goals of care discussion initiated with pt's husband. PT eval ordered  INDWELLING DEVICES:: ETT 07/27 >> 07/31 L [place] CVL 07/27 >>   MICRO DATA: Urine 07/26 >> ESBL E coli, carbapenem resistant klebsiella Blood 07/26 >> NEG MRSA PCR 07/27 >> NEG Resp (BAL) 07/27 >> pseudomonas (pansensitive)  >> AFB smear neg  >> fungal smear neg Urine 08/21 >> 6-30 WBC/HPF  ANTIMICROBIALS:  Vanc 07/26 >> 07/27 Pip-tazo 07/26 >> 07/29 Metronidazole 07/27 >> 07/28 Cipro 07/27 >> 07/28 Voriconazole 07/27 >> 08/01 Meropenem 07/29 >> 07/31 Ceftaz 07/31 >>   SUBJECTIVE:  RASS 0, -1. + F/C. No distress. Denies pain and dyspnea   VITAL SIGNS: Temp:  [97.6 F (36.4 C)-98.2 F (36.8 C)] 98.2 F (36.8 C) (07/31 0800) Pulse Rate:  [58-147] 76 (07/31 1000) Resp:  [12-27] 27 (07/31 1000) BP: (94-185)/(31-167) 124/55 (07/31 1000) SpO2:  [93 %-100 %] 100 % (07/31 1000) FiO2 (%):  [25 %] 25 %  (07/31 0809) Weight:  [193 lb 2 oz (87.6 kg)] 193 lb 2 oz (87.6 kg) (07/31 0232)  PHYSICAL EXAMINATION:  Gen: Obese, NAD Neuro: Cns intact, MAEs HEENT: NCAT Neck: JVP cannot be assessed Pulmonary: no adventitious sounds anteriorly Cardiovascular: occasional extrasystoles, no M Abdomen: Obese, soft, NT, +BS Extremities: trace symmetric pretibial edema  BMP Latest Ref Rng & Units 06/13/2016 06/12/2016 06/12/2016  Glucose 65 - 99 mg/dL 179(H) 186(H) 216(H)  BUN 6 - 20 mg/dL 27(H) 34(H) 43(H)  Creatinine 0.44 - 1.00 mg/dL <0.30(L) 0.37(L) 0.38(L)  Sodium 135 - 145 mmol/L 146(H) 147(H) 148(H)  Potassium 3.5 - 5.1 mmol/L 3.4(L) 3.5 2.8(L)  Chloride 101 - 111 mmol/L 109 111 113(H)  CO2 22 - 32 mmol/L 32 29 30  Calcium 8.9 - 10.3 mg/dL 9.0 9.1 9.2   CBC Latest Ref Rng & Units 06/13/2016 06/12/2016 06/11/2016  WBC 3.6 - 11.0 K/uL 6.3 5.6 5.2  Hemoglobin 12.0 - 16.0 g/dL 8.6(L) 8.9(L) 9.1(L)  Hematocrit 35.0 - 47.0 % 25.6(L) 26.8(L) 27.3(L)  Platelets 150 - 440 K/uL 107(L) 100(L) 108(L)   CXR: NNNF  ASSESSMENT/PLAN   PULMONARY A: Acute on chronic respiratory failure OSA/OHS COPD LLL cavitary PNA - suspect due to pseudomonas PNA H/O aspergillus - received 3+ months voriconazole  P: Monitor in SDU  Cont supplemental O2 to maintain SpO2 90-95% Cont nebulized bronchodilators  CARDIOVASCULAR A: Hypotension, resolved PSVT P: Telemetry monitoring  Cont enteral diltiazem   RENAL A: AKI, resolved Hypernatremia - improving Mild hypervolemia P: Monitor BMET intermittently Monitor I/Os Correct electrolytes as indicated Cont free water repletion  GASTROINTESTINAL A:  Chronic dysphagia G tube present P: SUP: enteral famotidine Cont TFs  HEMATOLOGIC A: Breast cancer status post chemotherapy/radiation  ICU acquired anemia without acute blood loss P: DVT px: SQ heparin Monitor CBC intermittently Transfuse per usual guidelines  INFECTIOUS A:  Left lower lobe cavitary  pneumonia due to pseudomonas ESBL E coli in urine 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 The Pneumonia is deemed to be the primary infection causing her acute illness P: Monitor temp, WBC count Micro and abx as above Recheck UA and cx 08/01  ENDOCRINE A: DM 2 Long term systemic steroids - concern for adrenal insufficiency P: Cont SSI and Lantus Wean hydrocortisone as able  NEUROLOGIC Depression Acute encephalopathy Severe debilitation P: Avoid sedating medications PT eval 08/02  --Pt high risk for reintubation if she requires reintubation she will need a tracheostomy     Merton Border, MD PCCM service Mobile [phone] Pager [phone]  06/14/2016

## 2016-06-14 NOTE — Evaluation (Signed)
Physical Therapy Evaluation Patient Details Name: Ebony [name] MRN: [id number] DOB: 07/17/1946 Today's Date: 06/14/2016   History of Present Illness  presented to ER secondary to urinary retention, constipation; admitted with sepsis related to L LL PNA (pseudomonas) and acute diverticulitis.  Hospital course significant for intubation 7/27-7/31 (now on supplemental O2 via [place]), bronchoscopy 7/27.  Of note, patient with previous hospitalization with prolonged intubation, resulting in trach (now removed and healed) and PEG with discharge to [facility]; returned home mid-July and has been bed-bound since, as patient unable to safety assist with [place] activities.  Clinical Impression  Upon evaluation, patient alert, but notably fatigued.  Oriented to basic information; verbally responsive to therapist, but with very soft-spoken, whispering voice.  Patient very weak and deconditioned throughout all extremities and core, requiring act assist from therapist for all functional movement against gravity.  Currently requiring total assist for all repositioning and bed mobility; unable to tolerate activity beyond rolling at this time.  High risk for skin breakdown, contracture due to weakness and immobility. Would benefit from skilled PT to address above deficits and promote optimal return to PLOF; recommend transition to STR vs. LTACH (pending medical stability) upon discharge from acute hospitalization.     Follow Up Recommendations SNF;LTACH (anticipate prolonged rehab course)    Equipment Recommendations       Recommendations for Other Services       Precautions / Restrictions Precautions Precautions: Fall Precaution Comments: Contact isolation, PEG, sacral ulcer Restrictions Weight Bearing Restrictions: No      Mobility  Bed Mobility Overal bed mobility: Needs Assistance Bed Mobility: Rolling Rolling: Total assist         General bed mobility comments: hand-over-hand to guide UEs for  placement; total assist for roation and management of LEs/trunk/pelvis  Transfers                 General transfer comment: unsafe/unable  Ambulation/Gait             General Gait Details: unsafe/unable  Stairs            Wheelchair Mobility    Modified Rankin (Stroke Patients Only)       Balance                                             Pertinent Vitals/Pain Pain Assessment: Faces Faces Pain Scale: Hurts a little bit Pain Location: R LE Pain Descriptors / Indicators: Grimacing Pain Intervention(s): Limited activity within patient's tolerance;Monitored during session;Repositioned    Home Living Family/patient expects to be discharged to:: Private residence Living Arrangements: Spouse/significant other Available Help at Discharge: Family;Available 24 hours/day Type of Home: House Home Access: Stairs to enter   [employer] of Steps: 2 Home Layout: One level        Prior Function           Comments: At true baseline, patient ambulatory for household distances as desired.  Since health decline and return home from [facility], has essentially been bed-bound, though chart suggests that patient was using SB to transfer from bed/WC at [facility].     Hand Dominance        Extremity/Trunk Assessment   Upper Extremity Assessment: Generalized weakness (globally weak and deconditioned, strength at least 3-/5; globally edematous (encouraged elevation on pillows))           Lower Extremity Assessment: Generalized  weakness (globally weak and deconditioned, strength 2+ to 3-/5 throughout bilat LEs, except R ankle DF 1/5.  Full DF available passively.)         Communication      Cognition Arousal/Alertness: Awake/alert Behavior During Therapy: WFL for tasks assessed/performed;Flat affect Overall Cognitive Status: Within Functional Limits for tasks assessed                      General Comments      Exercises  Other Exercises Other Exercises: Supine Ue/LE therex, 1x8-10, act assist ROM: shoulder flex/ext/abduct/adduct/IR/ER, elbow flex/ext, wrist flex/ext, gross grasp/release; hip flex/ext/abduct/adduct, knee flex/ext, ankle PF/DF.  Fatigues very quickly; facial grimacing, reports of pain in R calf with stretching.  Repositioned to L side end of evaluation for pressure relief to sacrum, total assist from therapist       Assessment/Plan    PT Assessment Patient needs continued PT services  PT Diagnosis Difficulty walking;Generalized weakness;Acute pain   PT Problem List Decreased strength;Decreased range of motion;Decreased activity tolerance;Decreased balance;Decreased mobility;Decreased knowledge of use of DME;Decreased safety awareness;Decreased knowledge of precautions;Obesity;Cardiopulmonary status limiting activity;Pain  PT Treatment Interventions DME instruction;Functional mobility training;Gait training;Therapeutic activities;Therapeutic exercise;Balance training;Patient/family education   PT Goals (Current goals can be found in the Care Plan section) Acute Rehab PT Goals Patient Stated Goal: patient did not verbalize specific goal; husband voices desire to participate with PT to 'build her strength and muscle mass up' PT Goal Formulation: With patient/family Time For Goal Achievement: 06/28/16 Potential to Achieve Goals: Fair Additional Goals Additional Goal #1: Assess and establish goals for OOB activities as patient appropriate    Frequency Min 2X/week   Barriers to discharge Decreased caregiver support      Co-evaluation               End of Session   Activity Tolerance: Patient limited by fatigue Patient left: in bed;with call bell/phone within reach;with family/visitor present           Time: [id number] PT Time Calculation (min) (ACUTE ONLY): 16 min   Charges:   PT Evaluation $PT Eval Moderate Complexity: 1 Procedure PT Treatments $Therapeutic Exercise: 8-22  mins   PT G Codes:        Jerita Wimbush H. Owens Shark, PT, DPT, NCS 06/14/16, 2:25 PM [phone]

## 2016-06-14 NOTE — Care Management (Signed)
I spoke with Estill Bamberg with [facility] [phone]. She states that pateint's brothers are having a lot to say about her treatment- the "brother's are the ones that told him to send her back to the hospital". He never mentioned that during my conversations. Per MD patient is not comfort care level however patient is very appropriate for continued hospice services. MD said he would speak with husband.

## 2016-06-14 NOTE — Care Management (Signed)
Informed by [facility] yesterday that patient will NOT require 21 day stay this visit and has started authorization per husband's request. I am unsure that patient/husband will accept hospice (open to [facility])  at discharge without being told that patient's prognosis is grave however, if she is appropriate to return home (she's bed dependent at baseline) he is going to want physical therapy with home health ([facility]). RNCM will continue to follow for discharge planning needs.

## 2016-06-14 NOTE — Progress Notes (Signed)
Notified Bincy, NP that patient is nauseated and that zofran was discontinued.  I held the tube feeds at this time.  Bincy stated she would order patient nausea medicine.

## 2016-06-15 ENCOUNTER — Inpatient Hospital Stay: Payer: Medicare Other

## 2016-06-15 DIAGNOSIS — G4733 Obstructive sleep apnea (adult) (pediatric): Secondary | ICD-10-CM

## 2016-06-15 DIAGNOSIS — R29898 Other symptoms and signs involving the musculoskeletal system: Secondary | ICD-10-CM

## 2016-06-15 LAB — GLUCOSE, CAPILLARY
Glucose-Capillary: 129 mg/dL — ABNORMAL HIGH (ref 65–99)
Glucose-Capillary: 133 mg/dL — ABNORMAL HIGH (ref 65–99)
Glucose-Capillary: 98 mg/dL (ref 65–99)
Glucose-Capillary: 179 mg/dL — ABNORMAL HIGH (ref 65–99)
Glucose-Capillary: 165 mg/dL — ABNORMAL HIGH (ref 65–99)

## 2016-06-15 LAB — BASIC METABOLIC PANEL
Anion gap: 4 — ABNORMAL LOW (ref 5–15)
BUN: 20 mg/dL (ref 6–20)
CALCIUM: 8.9 mg/dL (ref 8.9–10.3)
CO2: 32 mmol/L (ref 22–32)
Chloride: 103 mmol/L (ref 101–111)
Glucose, Bld: 131 mg/dL — ABNORMAL HIGH (ref 65–99)
Potassium: 3.6 mmol/L (ref 3.5–5.1)
SODIUM: 139 mmol/L (ref 135–145)

## 2016-06-15 MED ORDER — MORPHINE SULFATE (PF) 2 MG/ML IV SOLN
2.0000 mg | INTRAVENOUS | Status: DC | PRN
Start: 2016-06-15 — End: 2016-06-15
  Administered 2016-06-15 (×2): 2 mg via INTRAVENOUS
  Filled 2016-06-15 (×2): qty 1

## 2016-06-15 MED ORDER — MORPHINE SULFATE (PF) 2 MG/ML IV SOLN
1.0000 mg | INTRAVENOUS | Status: DC | PRN
  Administered 2016-06-15 – 2016-06-25 (×32): 1 mg via INTRAVENOUS
  Filled 2016-06-15 (×35): qty 1

## 2016-06-15 MED ORDER — POTASSIUM CHLORIDE 20 MEQ/15ML (10%) PO SOLN
40.0000 meq | Freq: Once | ORAL | Status: AC
  Administered 2016-06-15: 40 meq
  Filled 2016-06-15: qty 30

## 2016-06-15 MED ORDER — ESCITALOPRAM OXALATE 10 MG PO TABS
10.0000 mg | ORAL_TABLET | Freq: Every day | ORAL | Status: DC
  Administered 2016-06-15 – 2016-06-26 (×13): 10 mg via ORAL
  Filled 2016-06-15 (×12): qty 1

## 2016-06-15 MED ORDER — DOCUSATE SODIUM 50 MG/5ML PO LIQD
100.0000 mg | Freq: Two times a day (BID) | ORAL | Status: DC | PRN
Start: 2016-06-15 — End: 2016-06-26
  Filled 2016-06-15: qty 10

## 2016-06-15 MED ORDER — FREE WATER
200.0000 mL | Freq: Three times a day (TID) | Status: DC
  Administered 2016-06-15 – 2016-06-26 (×34): 200 mL

## 2016-06-15 NOTE — Care Management (Addendum)
Met with patient's husband and daughter that is here to meet with MD regarding patient care and code status. Husband is interested in rehab/SNF placement. He refused to have anything to do with [facility] "due to experience with their hospice service". List of home health agencies provided to husband along with my contact information. CSW updated on need for SNF, nocturnal Bipap potential need (NIV not available at SNF), and PICC- ABX need. Patient has her own CPAP at home which she uses at night per husband. Per MD LTAC is not appropriate and per [facility] patient's insurance has denied authorization to LTAC.

## 2016-06-15 NOTE — Progress Notes (Addendum)
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 BAL lingula and LLL. Cytology negative for malignancy 07/27 CTAP: Findings worrisome for acute uncomplicated diverticulitis involving the descending colon within the left lower abdominal quadrant. No evidence of perforation or definable/drainable fluid collection on this noncontrast examination. Interval development of rather extensive consolidative wedge-shaped opacities within the left lower lobe with potential central cavitation worrisome for infection and/or aspiration 07/31 Extubated 08/02 Changed to SDU status. Goals of care discussion initiated with pt's husband. PT eval ordered 08/03 Transferred to telemetry. LUE edema noted. L [place] CVL removed and PICC ordered.   INDWELLING DEVICES:: ETT 07/27 >> 07/31 L [place] CVL 07/27 >> 08/03  MICRO DATA: Urine 07/26 >> ESBL E coli, carbapenem resistant klebsiella Blood 07/26 >> NEG MRSA PCR 07/27 >> NEG Resp (BAL) 07/27 >> pseudomonas (pansensitive)  >> AFB smear neg  >> fungal smear neg Urine 08/21 >> 6-30 WBC/HPF  ANTIMICROBIALS:  Vanc 07/26 >> 07/27 Pip-tazo 07/26 >> 07/29 Metronidazole 07/27 >> 07/28 Cipro 07/27 >> 07/28 Voriconazole 07/27 >> 08/01 Meropenem 07/29 >> 07/31 Ceftaz 07/31 >>   SUBJECTIVE:  RASS 0. + F/C. No distress. Denies pain and dyspnea   VITAL SIGNS: Temp:  [97.6 F (36.4 C)-98.2 F (36.8 C)] 98.2 F (36.8 C) (07/31 0800) Pulse Rate:  [58-147] 76 (07/31 1000) Resp:  [12-27] 27 (07/31  1000) BP: (94-185)/(31-167) 124/55 (07/31 1000) SpO2:  [93 %-100 %] 100 % (07/31 1000) FiO2 (%):  [25 %] 25 % (07/31 0809) Weight:  [193 lb 2 oz (87.6 kg)] 193 lb 2 oz (87.6 kg) (07/31 0232)  PHYSICAL EXAMINATION:  Gen: Obese, NAD Neuro: Cns intact, MAEs HEENT: NCAT Neck: JVP cannot be assessed Pulmonary: no adventitious sounds anteriorly Cardiovascular: occasional extrasystoles, no M Abdomen: Obese, soft, NT, +BS Extremities: trace symmetric pretibial edema  BMP Latest Ref Rng & Units 06/15/2016 06/13/2016 06/12/2016  Glucose 65 - 99 mg/dL 131(H) 179(H) 186(H)  BUN 6 - 20 mg/dL 20 27(H) 34(H)  Creatinine 0.44 - 1.00 mg/dL <0.30(L) <0.30(L) 0.37(L)  Sodium 135 - 145 mmol/L 139 146(H) 147(H)  Potassium 3.5 - 5.1 mmol/L 3.6 3.4(L) 3.5  Chloride 101 - 111 mmol/L 103 109 111  CO2 22 - 32 mmol/L 32 32 29  Calcium 8.9 - 10.3 mg/dL 8.9 9.0 9.1   CBC Latest Ref Rng & Units 06/13/2016 06/12/2016 06/11/2016  WBC 3.6 - 11.0 K/uL 6.3 5.6 5.2  Hemoglobin 12.0 - 16.0 g/dL 8.6(L) 8.9(L) 9.1(L)  Hematocrit 35.0 - 47.0 % 25.6(L) 26.8(L) 27.3(L)  Platelets 150 - 440 K/uL 107(L) 100(L) 108(L)   CXR: NNF  IMPRESSION: Acute on chronic respiratory failure OSA/OHS COPD LLL cavitary PNA due to pseudomonas H/O aspergillus - s/p 3+ months voriconazole Hypotension, resolved PSVT LUE edema, concern for DVT AKI, resolved Hypernatremia - resolved Hypervolemia Chronic dysphagia G tube present Breast cancer status post chemotherapy/radiation ICU acquired anemia without acute blood loss ESBL E coli in urine 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 DM 2 Long term systemic steroids - concern for adrenal insufficiency Depression Acute encephalopathy Severe debilitation  PLAN/REC: Cont supplemental O2 to maintain SpO2 90-95%  Cont nebulized bronchodilators Transfer to telemetry  Cont diltiazem  LUE venous US ordered 08/03 L [place] CVL DC'd 08/03 PICC ordered 08/03 Monitor BMET intermittently Monitor  I/Os Correct electrolytes as indicated SUP: enteral famotidine Cont TFs DVT px: SQ heparin Monitor CBC intermittently Transfuse per usual guidelines Cont Ceftaz - she will need prolonged antibiotics (3-4 weeks or more).   Duration to be definitively determined by radiographic and clinical response Monitor temp, WBC count Micro and abx as above Recheck UA and cx 08/01 Cont SSI and Lantus Wean hydrocortisone as able PT has recommended SNF - will begin to pursue this option   Merton Border, MD PCCM service Mobile [phone] Pager [phone]  06/15/2016

## 2016-06-15 NOTE — Progress Notes (Signed)
Pt lying in bed resting comfortably at the moment . Pt has c/o pain last night MD made aware and orders received. Medication was effective for patient,further assessment via flow sheet.

## 2016-06-15 NOTE — Care Management (Signed)
Message left for husband, Legrand Como [phone]) to call this RNCM.

## 2016-06-15 NOTE — Progress Notes (Signed)
Notified Marda Stalker, NP about xray of PICC placement.  She ordered that I may use patient's PICC line at this time.

## 2016-06-15 NOTE — Clinical Social Work Note (Signed)
MSW received consult for patient needing SNF.  MSW to complete assessment at later time.  Jones Broom. Norval Morton, MSW [phone]  Mon-Fri 8a-4:30p 06/15/2016 4:29 PM

## 2016-06-15 NOTE — Progress Notes (Signed)
Vascular wellness contacted on pt. For picc line placement. Consent was given by husband. Vascular wellness acknowledge order.

## 2016-06-16 LAB — GLUCOSE, CAPILLARY
Glucose-Capillary: 104 mg/dL — ABNORMAL HIGH (ref 65–99)
Glucose-Capillary: 116 mg/dL — ABNORMAL HIGH (ref 65–99)
Glucose-Capillary: 122 mg/dL — ABNORMAL HIGH (ref 65–99)
Glucose-Capillary: 133 mg/dL — ABNORMAL HIGH (ref 65–99)
Glucose-Capillary: 146 mg/dL — ABNORMAL HIGH (ref 65–99)
Glucose-Capillary: 99 mg/dL (ref 65–99)

## 2016-06-16 MED ORDER — PREDNISONE 2.5 MG PO TABS
5.0000 mg | ORAL_TABLET | Freq: Every day | ORAL | Status: DC
  Administered 2016-06-16 – 2016-06-20 (×5): 5 mg
  Filled 2016-06-16 (×5): qty 2

## 2016-06-16 MED ORDER — INSULIN ASPART 100 UNIT/ML ~~LOC~~ SOLN
4.0000 [IU] | SUBCUTANEOUS | Status: DC
  Administered 2016-06-16 – 2016-06-26 (×61): 4 [IU] via SUBCUTANEOUS
  Filled 2016-06-16 (×50): qty 4

## 2016-06-16 MED ORDER — INSULIN ASPART 100 UNIT/ML ~~LOC~~ SOLN
0.0000 [IU] | SUBCUTANEOUS | Status: DC
  Administered 2016-06-16: 2 [IU] via SUBCUTANEOUS
  Administered 2016-06-17 (×4): 3 [IU] via SUBCUTANEOUS
  Administered 2016-06-17 – 2016-06-18 (×2): 5 [IU] via SUBCUTANEOUS
  Administered 2016-06-18: 2 [IU] via SUBCUTANEOUS
  Administered 2016-06-18 (×4): 3 [IU] via SUBCUTANEOUS
  Administered 2016-06-19: 5 [IU] via SUBCUTANEOUS
  Administered 2016-06-19 (×2): 8 [IU] via SUBCUTANEOUS
  Administered 2016-06-19 (×2): 2 [IU] via SUBCUTANEOUS
  Administered 2016-06-20 (×2): 5 [IU] via SUBCUTANEOUS
  Administered 2016-06-20: 11 [IU] via SUBCUTANEOUS
  Administered 2016-06-20: 8 [IU] via SUBCUTANEOUS
  Administered 2016-06-20: 5 [IU] via SUBCUTANEOUS
  Administered 2016-06-20: 8 [IU] via SUBCUTANEOUS
  Administered 2016-06-21: 11 [IU] via SUBCUTANEOUS
  Administered 2016-06-21: 8 [IU] via SUBCUTANEOUS
  Administered 2016-06-21 (×3): 11 [IU] via SUBCUTANEOUS
  Administered 2016-06-21: 5 [IU] via SUBCUTANEOUS
  Administered 2016-06-21 – 2016-06-22 (×2): 11 [IU] via SUBCUTANEOUS
  Administered 2016-06-22: 15 [IU] via SUBCUTANEOUS
  Administered 2016-06-22: 11 [IU] via SUBCUTANEOUS
  Filled 2016-06-16 (×3): qty 2
  Filled 2016-06-16: qty 5
  Filled 2016-06-16 (×3): qty 3
  Filled 2016-06-16: qty 15
  Filled 2016-06-16: qty 8
  Filled 2016-06-16: qty 5
  Filled 2016-06-16: qty 7
  Filled 2016-06-16: qty 11
  Filled 2016-06-16: qty 3
  Filled 2016-06-16: qty 2
  Filled 2016-06-16: qty 11
  Filled 2016-06-16 (×4): qty 5
  Filled 2016-06-16: qty 3
  Filled 2016-06-16: qty 15
  Filled 2016-06-16 (×2): qty 3
  Filled 2016-06-16: qty 11
  Filled 2016-06-16: qty 8
  Filled 2016-06-16: qty 11

## 2016-06-16 MED ORDER — ENOXAPARIN SODIUM 40 MG/0.4ML ~~LOC~~ SOLN
40.0000 mg | SUBCUTANEOUS | Status: DC
  Administered 2016-06-16 – 2016-06-26 (×12): 40 mg via SUBCUTANEOUS
  Filled 2016-06-16 (×11): qty 0.4

## 2016-06-16 NOTE — Progress Notes (Signed)
Changed dressing to sacral area per wound nurse order. Saline Irrigation used in place of dakins solution, applied to 4x4 gauze, covered with abd pad and pink foam dressing. Old removed dressings were saturated with brown drainage and bright red drainage. Pt medicated before procedure and tolerated well. I will continue to assess.

## 2016-06-16 NOTE — Progress Notes (Signed)
Physical Therapy Treatment Patient Details Name: Ebony [name] MRN: [id number] DOB: [birth year] Today's Date: 06/16/2016    History of Present Illness presented to ER secondary to urinary retention, constipation; admitted with sepsis related to L LL PNA (pseudomonas) and acute diverticulitis.  Hospital course significant for intubation 7/27-7/31 (now on supplemental O2 via [place]), bronchoscopy 7/27.  Of note, patient with previous hospitalization with prolonged intubation, resulting in trach (now removed and healed) and PEG with discharge to [facility]; returned home mid-July and has been bed-bound since, as patient unable to safety assist with [place] activities.    PT Comments    Patient with improved overall strength, activity tolerance this date compared to initial evaluation (even with improved voice quality); however, remains profoundly weak compared to true baseline.  Patient motivated to attempt unsupported sitting this date, but requires total assist +2 and tolerates only 1-2 min due to fatigue. Vitals stable and WFL this session; no orthostasis noted.   Follow Up Recommendations  SNF     Equipment Recommendations       Recommendations for Other Services       Precautions / Restrictions Precautions Precautions: Fall Precaution Comments: Contact isolation, PEG, sacral ulcer Restrictions Weight Bearing Restrictions: No    Mobility  Bed Mobility Overal bed mobility: Needs Assistance;+2 for physical assistance Bed Mobility: Supine to Sit;Sit to Supine Rolling: Total assist;+2 for physical assistance   Supine to sit: Total assist;+2 for physical assistance Sit to supine: Total assist;+2 for physical assistance   General bed mobility comments: tolerating unsupported sitting x1-2 min prior to need to return to supine due to fatigue; total assist for trunk control and upright positioning  Transfers                 General transfer comment: unsafe/unable  Ambulation/Gait             General Gait Details: unsafe/unable   Stairs            Wheelchair Mobility    Modified Rankin (Stroke Patients Only)       Balance                                    Cognition                            Exercises Other Exercises Other Exercises: Supine Ue/LE therex, 1x10, act assist ROM: shoulder flex/ext/abduct/adduct/IR/ER, elbow flex/ext, wrist flex/ext, gross grasp/release; hip flex/ext/abduct/adduct, knee flex/ext, ankle PF/DF.  Active strength fatigues quickly due to profound weakness/deconditioning.  R UE, L LE with greater functional strength    General Comments        Pertinent Vitals/Pain Pain Assessment: Faces Faces Pain Scale: Hurts even more Pain Location: bilat calfs, distal LEs Pain Descriptors / Indicators: Aching;Sore;Grimacing Pain Intervention(s): Limited activity within patient's tolerance;Monitored during session;Repositioned    Home Living                      Prior Function            PT Goals (current goals can now be found in the care plan section) Acute Rehab PT Goals Patient Stated Goal: patient did not verbalize specific goal; husband voices desire to participate with PT to 'build her strength and muscle mass up' PT Goal Formulation: With patient/family Time For Goal Achievement: 06/28/16 Potential to Achieve Goals:  Fair Progress towards PT goals: Progressing toward goals    Frequency  Min 2X/week    PT Plan Current plan remains appropriate    Co-evaluation             End of Session   Activity Tolerance: Patient limited by fatigue Patient left: in bed;with call bell/phone within reach;with bed alarm set     Time: 1031-1103 PT Time Calculation (min) (ACUTE ONLY): 32 min  Charges:  $Therapeutic Exercise: 8-22 mins $Therapeutic Activity: 8-22 mins                    G Codes:      Dorian Renfro H. Owens Shark, PT, DPT, NCS 06/16/16, 11:04 AM [phone]

## 2016-06-16 NOTE — Care Management Important Message (Signed)
Important Message  Patient Details  Name: PHANTASIA DAJANI MRN: [id number] Date of Birth: 08/21/1946   Medicare Important Message Given:  Yes    Jolly Mango, RN 06/16/2016, 9:29 AM

## 2016-06-16 NOTE — Evaluation (Signed)
Clinical/Bedside Swallow Evaluation Patient Details  Name: Ebony [name] MRN: [id number] Date of Birth: 10/01/1946  Today's Date: 06/16/2016 Time: SLP Start Time (ACUTE ONLY): [id number] SLP Stop Time (ACUTE ONLY): 1545 SLP Time Calculation (min) (ACUTE ONLY): 60 min  Past Medical History:  Past Medical History:  Diagnosis Date  . Asthma   . Breast cancer ([place])   . Carpal tunnel syndrome   . COPD (chronic obstructive pulmonary disease) ([place])   . Depression   . Diabetes mellitus type 2, uncomplicated ([place])   . Hyperlipidemia   . Hypertension   . Hypothyroidism   . Osteoporosis, post-menopausal   . Psoriasis   . Sleep apnea   . Vitamin D deficiency    Past Surgical History:  Past Surgical History:  Procedure Laterality Date  . BREAST EXCISIONAL BIOPSY Left 10/06/2015   np for surgery lumpectomy  . BREAST LUMPECTOMY WITH NEEDLE LOCALIZATION Left 10/06/2015   Procedure: BREAST LUMPECTOMY WITH NEEDLE LOCALIZATION;  Surgeon: Hubbard Robinson, MD;  Location: [facility] ORS;  Service: General;  Laterality: Left;  . carpal tunnel Right 1995  . Right heel spur removed    . SENTINEL NODE BIOPSY Left 10/06/2015   Procedure: SENTINEL NODE BIOPSY;  Surgeon: Hubbard Robinson, MD;  Location: [facility] ORS;  Service: General;  Laterality: Left;  . THUMB ARTHROSCOPY Left 2000   release of a nerve to the thumb.  . TRACHEOSTOMY TUBE PLACEMENT N/A 03/14/2016   Procedure: TRACHEOSTOMY;  Surgeon: Carloyn Manner, MD;  Location: [facility] ORS;  Service: ENT;  Laterality: N/A;  . 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 She was orally intubated for bronchoscopy on 06/08/16 then extubated on 06/12/16. Pt has been NPO since. She denied any h/o dysphagia but does have a  PEG placement and receives TFs for nutrition post extended illness last admission, wounds. Per report, pt was not meeting her nutritional needs orally previously.   Assessment / Plan / Recommendation Clinical Impression  Pt appears at a mildly increased risk for aspiration w/ oral intake secondary to her declined medical and Pulmonary status' in general. However, when presented po trials of thin liquids via cup and straw then trials of purees, no immediate overt s/s of aspiration were noted; oral phase appeared wfl. Pt did exhibit a mild(congested) cough inconsistently b/t po trials but cough appeared similar to her congested cough at baseline and it did not appear to worsen or increase as po trials continued. Suspect the cough was related to the increased respiratory effort/breathing w/ possible irritation of her trachea d/t declined Pulmonary status baseline. Following aspiration precautions, pt appeared to tolerate the po trials and stated she "had been waiting so long for that". Recommend initiation of an oral diet w/ trial of a Full Liquid consistency diet and ongoing assessment and trials w/ ST services to upgrade to least restrictive po diet consistency. NSG updated and agreed.     Aspiration Risk  Mild aspiration risk    Diet Recommendation  Full Liquid diet/consistency at this time; strict aspiration precautions including Rest Breaks to avoid any increased WOB/SOB. Reflux precautions. Feeding Assistance at all meals.   Medication Administration: Via alternative means ( PEG baseline)    Other  Recommendations Recommended Consults:  (Dietician following) Oral Care Recommendations: Oral care BID;Staff/trained  caregiver to provide oral care   Follow up Recommendations   (TBD)    Frequency and Duration min 3x week  2 weeks       Prognosis Prognosis for Safe Diet Advancement: Fair (-Good) Barriers to Reach Goals: Severity of deficits      Swallow Study   General Date of Onset:  06/07/16 HPI: Pt is a 70 year old female history of breast cancer status post left lumpectomy andradiation therapy, left lung cavity, 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 She was orally intubated for bronchoscopy on 06/08/16 then extubated on 06/12/16. Pt has been NPO since. She denied any h/o dysphagia but does have a PEG placement and receives TFs for nutrition post extended illness last admission, wounds. Per report, pt was not meeting her nutritional needs orally previously. Type of Study: Bedside Swallow Evaluation Previous Swallow Assessment: unknown Diet Prior to this Study: PEG tube (NPO since procedure) Temperature Spikes Noted: No (wbc 6.3) Respiratory Status: Nasal cannula (2 liters) History of Recent Intubation: Yes Length of Intubations (days): 5 days Date extubated: 06/12/16 Behavior/Cognition: Alert;Cooperative;Pleasant mood Oral Cavity Assessment: Dry Oral Care Completed by SLP: Recent completion by staff Oral Cavity - Dentition: Adequate natural dentition Vision: Functional for self-feeding Self-Feeding Abilities: Able to feed self;Needs assist;Needs set up (easily fatigued w/ exertion) Patient Positioning: Upright in bed Baseline Vocal Quality: Low vocal intensity Volitional Cough: Strong;Congested (mild, congested cough baseline d/t pulmonary status) Volitional Swallow: Able to elicit    Oral/Motor/Sensory Function Overall Oral Motor/Sensory Function: Within functional limits   Ice Chips Ice chips: Within functional limits Presentation: Spoon (fed; 4 trials)   Thin Liquid Thin Liquid: Within functional limits Presentation: Cup;Self Fed;Straw (assisted; 4 trials w/ each) Other Comments: similar mildly, congested cough as at baseline d/t pulmonary status - did not increase or change in presentation w/ po trials and did not immediately follow po trials    Nectar  Thick Nectar Thick Liquid: Not tested   Honey Thick Honey Thick Liquid: Not tested   Puree Puree: Within functional limits Presentation: Spoon (fed; 8 trials) Other Comments: similar cough as w/ trials of thin liquids - did not appear directly related to the po trials themselves   Solid   GO   Solid: Not tested         Orinda Kenner, MS, CCC-SLP  Ebony [name] 06/16/2016,3:53 PM

## 2016-06-16 NOTE — Plan of Care (Signed)
Problem: SLP Dysphagia Goals Goal: Misc Dysphagia Goal Pt will safely tolerate po diet of least restrictive consistency w/ no overt s/s of aspiration noted by Staff/pt/family x3 sessions.    

## 2016-06-16 NOTE — Progress Notes (Signed)
Attempted to place pt on Bipap for sleep. Pt refused. I will attempt again when she gets her scheduled nebulizer treatment at 0200.

## 2016-06-16 NOTE — Progress Notes (Signed)
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 BAL lingula and LLL. Cytology negative for malignancy 07/27 CTAP: Findings worrisome for acute uncomplicated diverticulitis involving the descending colon within the left lower abdominal quadrant. No evidence of perforation or definable/drainable fluid collection on this noncontrast examination. Interval development of rather extensive consolidative wedge-shaped opacities within the left lower lobe with potential central cavitation worrisome for infection and/or aspiration 07/31 Extubated 08/02 Changed to SDU status. Goals of care discussion initiated with pt's husband. PT eval ordered 08/03 Transferred to telemetry. LUE edema noted. L [place] CVL removed and PICC ordered.  08/03 LUE venous US: negative for DVT 08/03 goals of care discussion: DNR in event of cardiac arrest. If re-intubated, short term only. No trach tube or LTACH 08/04 More awake and interactive. SLP eval ordered  INDWELLING DEVICES:: ETT 07/27 >> 07/31 L [place] CVL 07/27 >> 08/03 RUE PICC 08/03 >>   MICRO DATA: Urine 07/26 >> ESBL E coli, carbapenem resistant klebsiella Blood 07/26 >> NEG MRSA PCR 07/27 >> NEG Resp (BAL) 07/27 >> pseudomonas (pansensitive)  >> AFB smear neg  >> fungal smear neg Urine 08/21 >> 6-30 WBC/HPF  ANTIMICROBIALS:  Vanc 07/26 >> 07/27 Pip-tazo 07/26 >> 07/29 Metronidazole 07/27 >> 07/28 Cipro 07/27 >> 07/28 Voriconazole 07/27 >> 08/01 Meropenem 07/29 >> 07/31 Ceftaz 07/31 >>    SUBJECTIVE:  RASS 0. + F/C. No distress. More interactive. Denies pain and dyspnea   VITAL SIGNS: Temp:  [97.6 F (36.4 C)-98.2 F (36.8 C)] 98.2 F (36.8 C) (07/31 0800) Pulse Rate:  [58-147] 76 (07/31 1000) Resp:  [12-27] 27 (07/31 1000) BP: (94-185)/(31-167) 124/55 (07/31 1000) SpO2:  [93 %-100 %] 100 % (07/31 1000) FiO2 (%):  [25 %] 25 % (07/31 0809) Weight:  [193 lb 2 oz (87.6 kg)] 193 lb 2 oz (87.6 kg) (07/31 0232)  PHYSICAL EXAMINATION:  Gen: Obese, NAD Neuro: CNs intact, MAEs HEENT: NCAT Neck: JVP cannot be assessed Pulmonary: no adventitious sounds anteriorly Cardiovascular: occasional extrasystoles, no M Abdomen: Obese, soft, NT, +BS Extremities: 1+ symmetric pretibial edema  BMP Latest Ref Rng & Units 06/15/2016 06/13/2016 06/12/2016  Glucose 65 - 99 mg/dL 131(H) 179(H) 186(H)  BUN 6 - 20 mg/dL 20 27(H) 34(H)  Creatinine 0.44 - 1.00 mg/dL <0.30(L) <0.30(L) 0.37(L)  Sodium 135 - 145 mmol/L 139 146(H) 147(H)  Potassium 3.5 - 5.1 mmol/L 3.6 3.4(L) 3.5  Chloride 101 - 111 mmol/L 103 109 111  CO2 22 - 32 mmol/L 32 32 29  Calcium 8.9 - 10.3 mg/dL 8.9 9.0 9.1   CBC Latest Ref Rng & Units 06/13/2016 06/12/2016 06/11/2016  WBC 3.6 - 11.0 K/uL 6.3 5.6 5.2  Hemoglobin 12.0 - 16.0 g/dL 8.6(L) 8.9(L) 9.1(L)  Hematocrit 35.0 - 47.0 % 25.6(L) 26.8(L) 27.3(L)  Platelets 150 - 440 K/uL 107(L) 100(L) 108(L)   CXR (06/15/16): increased opacity in LLL - likely atx superimposed on PNA   IMPRESSION: Acute on chronic respiratory failure OSA/OHS COPD LLL cavitary PNA due to pseudomonas H/O aspergillus - s/p 3 months voriconazole Hypotension, resolved PSVT LUE edema, improved AKI, resolved Hypernatremia - resolved Hypervolemia  Chronic dysphagia G tube present Breast cancer status post chemotherapy/radiation ICU acquired anemia without acute blood loss ESBL E coli in urine 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 DM 2, controlled Long term systemic steroids - concern for  adrenal insufficiency Depression Acute encephalopathy, much improved 08/04 Severe chronic debilitation  PLAN/REC: Cont supplemental O2 to maintain SpO2 90-95% Cont nebulized bronchodilators Cont diltiazem per tube Monitor BMET intermittently Monitor I/Os Correct electrolytes as indicated SUP: enteral famotidine Cont TFs SLP eval 08/04 - transition to PO diet as able DVT px: enoxaparin Monitor CBC intermittently Transfuse per usual guidelines Cont Ceftaz - she will need prolonged antibiotics (3-4 weeks or more).   Duration to be definitively determined by radiographic and clinical response Monitor temp, WBC count Micro and abx as above Cont SSI and Lantus Change prednisone to 5 mg daily Continue PT Pursuing SNF - earliest would be first of next week   Merton Border, MD PCCM service Mobile [phone] Pager [phone]  06/16/2016

## 2016-06-16 NOTE — Progress Notes (Signed)
Made several attempt to reach the MD that placed the discontinue foley order. This patient has MASD to groin and sacral area. Patient is bedbound and has unstageable sacral ulceration. RN feel the foley is needed for atleast another day or two to increase healing in these areas.  RN tried to reach MD about concerns with no return call. Foley left in place. Night RN asked to assess.

## 2016-06-16 NOTE — Progress Notes (Signed)
Nutrition Follow-up  DOCUMENTATION CODES:   Obesity unspecified  INTERVENTION:  1.  Meals/snacks; diet advancement per MD.  Patient was reportedly tolerating PO foods PTA.  Once respiratory status improves, consider evaluation for safe PO diet.  2.  Enteral nutrition; continue Glucerna 1.2 @ 65 mL/hr continuous with Prostat once daily to provide 1972 kcal, 108g protein, 1250 mL free water.    NUTRITION DIAGNOSIS:   Inadequate oral intake related to acute illness as evidenced by NPO status.   GOAL:   Provide needs based on ASPEN/SCCM guidelines   MONITOR:   TF tolerance, Weight trends, Vent status, Labs  REASON FOR ASSESSMENT:   Consult Enteral/tube feeding initiation and management  ASSESSMENT:   70 y/o female admitted with sepsis, respiratory failure and LLL PNA/cavitary lesions; CT adb with acute diverticulitis as well.  Patient has hx of breast CA s/p radiation therapy completed in March 2017.  PTA pt had a PEG in place but was eating some foods PO per her husband.  She is currently on enteral nutrition as sole source nutrition.  Patient asking about eating PO.   Diet Order:     Skin:  Wound (see comment) (unstageable and stage III pressure ulcer on sacrum)  Last BM:  8/1  Height:   Ht Readings from Last 1 Encounters:  06/08/16 5\' 2"  (1.575 m)    Weight:   Wt Readings from Last 1 Encounters:  06/16/16 205 lb 3.2 oz (93.1 kg)    Ideal Body Weight:     BMI:  Body mass index is 37.53 kg/m.  Estimated Nutritional Needs:   Kcal:  [id number] kcals   Protein:  >/= 90 g  Fluid:  >/= 1.7 L  EDUCATION NEEDS:   No education needs identified at this time  Brynda Greathouse, MS RD LDN Clinical Inpatient Dietitian

## 2016-06-16 NOTE — NC FL2 (Signed)
[place] LEVEL OF CARE SCREENING TOOL     IDENTIFICATION  Patient Name: Ebony [name] Birthdate: 06-18-1946 Sex: female Admission Date (Current Location): 06/07/2016  [place] and [place] Number:  Manufacturing engineer and Address:  [facility], [address], [place],  [address]      Provider Number: [id number]  Attending Physician Name and Address:  Laverle Hobby, MD  Relative Name and Phone Number:  Fayrene, Towner [phone]     Current Level of Care: Hospital Recommended Level of Care: [place] Prior Approval Number:    Date Approved/Denied:   PASRR Number: [id number]  Discharge Plan: SNF    Current Diagnoses: Patient Active Problem List   Diagnosis Date Noted  . Hypotension 06/08/2016  . Pressure ulcer 03/18/2016  . Problems with swallowing and mastication   . COPD with exacerbation ([place])   . Mild malnutrition ([place])   . History of infection not responsive to antibiotic therapy   . Acute respiratory failure ([place]) 02/27/2016  . Aftercare following surgery   . Acute respiratory failure with hypoxia ([place]) 10/07/2015  . COPD exacerbation ([place]) 10/07/2015  . Community acquired pneumonia 10/07/2015  . Hyperkalemia 10/07/2015  . Breast cancer, stage 1 ([place])   . Cancer of left female breast ([place]) 09/09/2015  . Mild chronic obstructive pulmonary disease ([place]) 06/25/2014  . Inflamed nasal mucosa 06/25/2014  . Apnea, sleep 06/25/2014    Orientation RESPIRATION BLADDER Height & Weight     Self, Time, Situation, Place  O2 (2L) Continent Weight: 205 lb 3.2 oz (93.1 kg) Height:  _0  (157.5 cm)  BEHAVIORAL SYMPTOMS/MOOD NEUROLOGICAL BOWEL NUTRITION STATUS      Continent Feeding tube  AMBULATORY STATUS COMMUNICATION OF NEEDS Skin   Limited Assist Verbally Other (Comment) (Unstageable pressure ulcer, daily dressing changes)                       Personal Care Assistance Level  of Assistance  Bathing, Dressing Bathing Assistance: Limited assistance   Dressing Assistance: Limited assistance     Functional Limitations Info  Sight, Hearing, Speech Sight Info: Adequate Hearing Info: Adequate Speech Info: Adequate    SPECIAL CARE FACTORS FREQUENCY  PT (By licensed PT), OT (By licensed OT)     PT Frequency: 5x a week OT Frequency: 5x a week            Contractures Contractures Info: Not present    Additional Factors Info  Code Status, Insulin Sliding Scale, Isolation Precautions Code Status Info: Partial     Insulin Sliding Scale Info: 3x a day Isolation Precautions Info: ESBL     Current Medications (06/16/2016):  This is the current hospital active medication list Current Facility-Administered Medications  Medication Dose Route Frequency Provider Last Rate Last Dose  . acetaminophen (TYLENOL) tablet 650 mg  650 mg Per Tube Q6H PRN Wilhelmina Mcardle, MD   650 mg at 06/15/16 2136  . albuterol (PROVENTIL) (2.5 MG/3ML) 0.083% nebulizer solution 2.5 mg  2.5 mg Nebulization Q6H Laverle Hobby, MD   2.5 mg at 06/16/16 0718  . albuterol (PROVENTIL) (2.5 MG/3ML) 0.083% nebulizer solution 2.5 mg  2.5 mg Nebulization Q3H PRN Wilhelmina Mcardle, MD   2.5 mg at 06/15/16 1812  . antiseptic oral rinse solution (CORINZ)  7 mL Mouth Rinse 10 times per day Laverle Hobby, MD   7 mL at 06/16/16 0400  . cefTAZidime (FORTAZ) 2 g in dextrose 5 % 50  mL IVPB  2 g Intravenous Q8H Laverle Hobby, MD   2 g at 06/16/16 0541  . chlorhexidine gluconate (SAGE KIT) (PERIDEX) 0.12 % solution 15 mL  15 mL Mouth Rinse BID Laverle Hobby, MD   15 mL at 06/15/16 2200  . diltiazem (CARDIZEM) 10 mg/ml oral suspension 30 mg  30 mg Per Tube Q6H Wilhelmina Mcardle, MD   30 mg at 06/16/16 0554  . docusate (COLACE) 50 MG/5ML liquid 100 mg  100 mg Per Tube BID PRN Wilhelmina Mcardle, MD      . enoxaparin (LOVENOX) injection 40 mg  40 mg Subcutaneous Q24H Wilhelmina Mcardle, MD      .  escitalopram (LEXAPRO) tablet 10 mg  10 mg Oral Daily Wilhelmina Mcardle, MD   10 mg at 06/15/16 1104  . famotidine (PEPCID) 40 MG/5ML suspension 20 mg  20 mg Per Tube Daily Laverle Hobby, MD   20 mg at 06/15/16 1104  . feeding supplement (GLUCERNA 1.2 CAL) liquid 1,000 mL  1,000 mL Per Tube Continuous Wilhelmina Mcardle, MD 65 mL/hr at 06/16/16 0600 1,000 mL at 06/16/16 0600  . feeding supplement (PRO-STAT SUGAR FREE 64) liquid 30 mL  30 mL Per Tube BID Wilhelmina Mcardle, MD   30 mL at 06/15/16 1104  . free water 200 mL  200 mL Per Tube Q8H Wilhelmina Mcardle, MD   200 mL at 06/16/16 0525  . insulin aspart (novoLOG) injection 0-15 Units  0-15 Units Subcutaneous Q4H Wilhelmina Mcardle, MD      . insulin aspart (novoLOG) injection 4 Units  4 Units Subcutaneous Q4H Wilhelmina Mcardle, MD      . insulin glargine (LANTUS) injection 18 Units  18 Units Subcutaneous QHS Pernell Dupre, RPH   18 Units at 06/15/16 2135  . levothyroxine (SYNTHROID, LEVOTHROID) tablet 200 mcg  200 mcg Per Tube QAC breakfast Wilhelmina Mcardle, MD   200 mcg at 06/16/16 0541  . levothyroxine (SYNTHROID, LEVOTHROID) tablet 50 mcg  50 mcg Per Tube QAC breakfast Wilhelmina Mcardle, MD   50 mcg at 06/16/16 [phone]  . morphine 2 MG/ML injection 1 mg  1 mg Intravenous Q1H PRN Wilhelmina Mcardle, MD   1 mg at 06/16/16 0858  . ondansetron (ZOFRAN) injection 4 mg  4 mg Intravenous Q6H PRN Holley Raring, NP   4 mg at 06/14/16 1845  . polyethylene glycol (MIRALAX / GLYCOLAX) packet 17 g  17 g Per Tube Daily PRN Laverle Hobby, MD      . predniSONE (DELTASONE) tablet 5 mg  5 mg Per Tube Daily Wilhelmina Mcardle, MD         Discharge Medications: Please see discharge summary for a list of discharge medications.  Relevant Imaging Results:  Relevant Lab Results:   Additional Information SSN [id number]  Norval Morton Heath Gold, MD PCCM service Mobile [phone] Pager [phone] 06/16/2016

## 2016-06-17 LAB — GLUCOSE, CAPILLARY
Glucose-Capillary: 105 mg/dL — ABNORMAL HIGH (ref 65–99)
Glucose-Capillary: 169 mg/dL — ABNORMAL HIGH (ref 65–99)
Glucose-Capillary: 170 mg/dL — ABNORMAL HIGH (ref 65–99)
Glucose-Capillary: 179 mg/dL — ABNORMAL HIGH (ref 65–99)
Glucose-Capillary: 198 mg/dL — ABNORMAL HIGH (ref 65–99)
Glucose-Capillary: 211 mg/dL — ABNORMAL HIGH (ref 65–99)

## 2016-06-17 MED ORDER — IPRATROPIUM-ALBUTEROL 0.5-2.5 (3) MG/3ML IN SOLN
3.0000 mL | Freq: Four times a day (QID) | RESPIRATORY_TRACT | Status: DC
  Administered 2016-06-17 – 2016-06-26 (×35): 3 mL via RESPIRATORY_TRACT
  Filled 2016-06-17 (×37): qty 3

## 2016-06-17 MED ORDER — GLUCERNA 1.2 CAL PO LIQD
1000.0000 mL | ORAL | Status: DC
  Administered 2016-06-17 – 2016-06-19 (×3): 1000 mL

## 2016-06-17 MED ORDER — BUDESONIDE 0.25 MG/2ML IN SUSP
0.2500 mg | Freq: Four times a day (QID) | RESPIRATORY_TRACT | Status: DC
  Administered 2016-06-17 – 2016-06-24 (×28): 0.25 mg via RESPIRATORY_TRACT
  Filled 2016-06-17 (×30): qty 2

## 2016-06-17 MED ORDER — LORAZEPAM 2 MG/ML IJ SOLN
1.0000 mg | Freq: Three times a day (TID) | INTRAMUSCULAR | Status: DC | PRN
  Administered 2016-06-17 – 2016-06-18 (×3): 1 mg via INTRAVENOUS
  Filled 2016-06-17 (×3): qty 1

## 2016-06-17 NOTE — Progress Notes (Signed)
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 BAL lingula and LLL. Cytology negative for malignancy 07/27 CTAP: Findings worrisome for acute uncomplicated diverticulitis involving the descending colon within the left lower abdominal quadrant. No evidence of perforation or definable/drainable fluid collection on this noncontrast examination. Interval development of rather extensive consolidative wedge-shaped opacities within the left lower lobe with potential central cavitation worrisome for infection and/or aspiration 07/31 Extubated 08/02 Changed to SDU status. Goals of care discussion initiated with pt's husband. PT eval ordered 08/03 Transferred to telemetry. LUE edema noted. L [place] CVL removed and PICC ordered.  08/03 LUE venous US: negative for DVT 08/03 goals of care discussion: DNR in event of cardiac arrest. If re-intubated, short term only. No trach tube or LTACH 08/04 More awake and interactive. SLP eval ordered > full liquid diet ordered 08/05 TF rate decreased  INDWELLING DEVICES:: ETT 07/27 >> 07/31 L [place] CVL 07/27 >> 08/03 RUE PICC 08/03 >>   MICRO DATA: Urine 07/26 >> ESBL E coli, carbapenem resistant klebsiella Blood 07/26 >> NEG MRSA PCR 07/27 >> NEG Resp (BAL) 07/27 >> pseudomonas (pansensitive)  >> AFB smear neg  >> fungal smear neg Urine 08/21 >> 6-30 WBC/HPF  ANTIMICROBIALS:  Vanc 07/26 >> 07/27 Pip-tazo 07/26 >> 07/29 Metronidazole 07/27 >> 07/28 Cipro 07/27 >> 07/28 Voriconazole 07/27 >>  08/01 Meropenem 07/29 >> 07/31 Ceftaz 07/31 >>   SUBJECTIVE:  RASS 0. + F/C. No distress. More interactive. Denies pain and dyspnea. Rattling cough   VITAL SIGNS: Temp:  [97.6 F (36.4 C)-98.2 F (36.8 C)] 98.2 F (36.8 C) (07/31 0800) Pulse Rate:  [58-147] 76 (07/31 1000) Resp:  [12-27] 27 (07/31 1000) BP: (94-185)/(31-167) 124/55 (07/31 1000) SpO2:  [93 %-100 %] 100 % (07/31 1000) FiO2 (%):  [25 %] 25 % (07/31 0809) Weight:  [193 lb 2 oz (87.6 kg)] 193 lb 2 oz (87.6 kg) (07/31 0232)  PHYSICAL EXAMINATION:  Gen: Obese, NAD Neuro: CNs intact, MAEs HEENT: NCAT Neck: JVP cannot be assessed Pulmonary: few scattered wheezes Cardiovascular: occasional extrasystoles, no M Abdomen: Obese, soft, NT, +BS Extremities: 1+ symmetric pretibial edema  BMP Latest Ref Rng & Units 06/15/2016 06/13/2016 06/12/2016  Glucose 65 - 99 mg/dL 131(H) 179(H) 186(H)  BUN 6 - 20 mg/dL 20 27(H) 34(H)  Creatinine 0.44 - 1.00 mg/dL <0.30(L) <0.30(L) 0.37(L)  Sodium 135 - 145 mmol/L 139 146(H) 147(H)  Potassium 3.5 - 5.1 mmol/L 3.6 3.4(L) 3.5  Chloride 101 - 111 mmol/L 103 109 111  CO2 22 - 32 mmol/L 32 32 29  Calcium 8.9 - 10.3 mg/dL 8.9 9.0 9.1   CBC Latest Ref Rng & Units 06/13/2016 06/12/2016 06/11/2016  WBC 3.6 - 11.0 K/uL 6.3 5.6 5.2  Hemoglobin 12.0 - 16.0 g/dL 8.6(L) 8.9(L) 9.1(L)  Hematocrit 35.0 - 47.0 % 25.6(L) 26.8(L) 27.3(L)  Platelets 150 - 440 K/uL 107(L) 100(L) 108(L)   CXR: NNF  IMPRESSION: Acute on chronic respiratory failure OSA/OHS COPD with wheezing LLL cavitary PNA due to pseudomonas H/O aspergillus - s/p 3 months voriconazole Hypotension, resolved PSVT LUE edema, improved AKI, resolved Hypernatremia - resolved  Hypervolemia Chronic dysphagia G tube present Breast cancer status post chemotherapy/radiation ICU acquired anemia without acute blood loss ESBL E coli in urine 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 DM 2, control has worsened with increased intake (full  liquids in addition to  Long term systemic steroids Chronic depression Acute encephalopathy, resolved  Severe chronic debilitation  PLAN/REC: Cont supplemental O2 to maintain SpO2 90-95% Cont nebulized bronchodilators Add nebulized steroids 08/05 Cont diltiazem per tube Monitor BMET intermittently Correct electrolytes as indicated SUP: enteral famotidine Cont TFs - decrease rate 08/05 due to initiation of full liquid diet Advance POs as able SLP eval 08/04 - transition to PO diet as able DVT px: enoxaparin Monitor CBC intermittently Cont Ceftaz - she will need prolonged antibiotics (planned through 07/07/16 to complete 4 weeks).   Duration to be definitively determined by radiographic and clinical response Monitor temp, WBC count Micro and abx as above Cont SSI and Lantus Cont prednisone 5 mg daily Continue PT Pursuing SNF - earliest would be first of next week   Merton Border, MD PCCM service Mobile [phone] Pager [phone]  06/17/2016

## 2016-06-18 LAB — GLUCOSE, CAPILLARY
Glucose-Capillary: 155 mg/dL — ABNORMAL HIGH (ref 65–99)
Glucose-Capillary: 164 mg/dL — ABNORMAL HIGH (ref 65–99)
Glucose-Capillary: 153 mg/dL — ABNORMAL HIGH (ref 65–99)
Glucose-Capillary: 216 mg/dL — ABNORMAL HIGH (ref 65–99)
Glucose-Capillary: 187 mg/dL — ABNORMAL HIGH (ref 65–99)
Glucose-Capillary: 132 mg/dL — ABNORMAL HIGH (ref 65–99)

## 2016-06-18 MED ORDER — CETYLPYRIDINIUM CHLORIDE 0.05 % MT LIQD
7.0000 mL | Freq: Two times a day (BID) | OROMUCOSAL | Status: DC
  Administered 2016-06-18 – 2016-06-26 (×16): 7 mL via OROMUCOSAL

## 2016-06-18 NOTE — Progress Notes (Signed)
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 BAL lingula and LLL. Cytology negative for malignancy 07/27 CTAP: Findings worrisome for acute uncomplicated diverticulitis involving the descending colon within the left lower abdominal quadrant. No evidence of perforation or definable/drainable fluid collection on this noncontrast examination. Interval development of rather extensive consolidative wedge-shaped opacities within the left lower lobe with potential central cavitation worrisome for infection and/or aspiration 07/31 Extubated 08/02 Changed to SDU status. Goals of care discussion initiated with pt's husband. PT eval ordered 08/03 Transferred to telemetry. LUE edema noted. L [place] CVL removed and PICC ordered.  08/03 LUE venous US: negative for DVT 08/03 goals of care discussion: DNR in event of cardiac arrest. If re-intubated, short term only. No trach tube or LTACH 08/04 More awake and interactive. SLP eval ordered > full liquid diet ordered 08/05 TF rate decreased  INDWELLING DEVICES:: ETT 07/27 >> 07/31 L [place] CVL 07/27 >> 08/03 RUE PICC 08/03 >>   MICRO DATA: Urine 07/26 >> ESBL E coli, carbapenem resistant klebsiella Blood 07/26 >> NEG MRSA PCR 07/27 >> NEG Resp (BAL) 07/27 >> pseudomonas (pansensitive)  >> AFB smear neg  >> fungal smear neg Urine 08/21 >> 6-30 WBC/HPF  ANTIMICROBIALS:  Vanc 07/26 >> 07/27 Pip-tazo 07/26 >> 07/29 Metronidazole 07/27 >> 07/28 Cipro 07/27 >> 07/28 Voriconazole 07/27 >>  08/01 Meropenem 07/29 >> 07/31 Ceftaz 07/31 >>   SUBJECTIVE:  RASS 0. + F/C. No distress. No new complaints   VITAL SIGNS: Temp:  [97.6 F (36.4 C)-98.2 F (36.8 C)] 98.2 F (36.8 C) (07/31 0800) Pulse Rate:  [58-147] 76 (07/31 1000) Resp:  [12-27] 27 (07/31 1000) BP: (94-185)/(31-167) 124/55 (07/31 1000) SpO2:  [93 %-100 %] 100 % (07/31 1000) FiO2 (%):  [25 %] 25 % (07/31 0809) Weight:  [193 lb 2 oz (87.6 kg)] 193 lb 2 oz (87.6 kg) (07/31 0232)  PHYSICAL EXAMINATION:  Gen: Obese, NAD Neuro: CNs intact, MAEs HEENT: NCAT Neck: JVP cannot be assessed Pulmonary: few scattered wheezes Cardiovascular: occasional extrasystoles, no M Abdomen: Obese, soft, NT, +BS Extremities: 1+ symmetric pretibial edema  BMP Latest Ref Rng & Units 06/15/2016 06/13/2016 06/12/2016  Glucose 65 - 99 mg/dL 131(H) 179(H) 186(H)  BUN 6 - 20 mg/dL 20 27(H) 34(H)  Creatinine 0.44 - 1.00 mg/dL <0.30(L) <0.30(L) 0.37(L)  Sodium 135 - 145 mmol/L 139 146(H) 147(H)  Potassium 3.5 - 5.1 mmol/L 3.6 3.4(L) 3.5  Chloride 101 - 111 mmol/L 103 109 111  CO2 22 - 32 mmol/L 32 32 29  Calcium 8.9 - 10.3 mg/dL 8.9 9.0 9.1   CBC Latest Ref Rng & Units 06/13/2016 06/12/2016 06/11/2016  WBC 3.6 - 11.0 K/uL 6.3 5.6 5.2  Hemoglobin 12.0 - 16.0 g/dL 8.6(L) 8.9(L) 9.1(L)  Hematocrit 35.0 - 47.0 % 25.6(L) 26.8(L) 27.3(L)  Platelets 150 - 440 K/uL 107(L) 100(L) 108(L)   CXR: NNF  IMPRESSION: Acute on chronic respiratory failure OSA/OHS COPD with wheezing LLL cavitary PNA due to pseudomonas H/O aspergillus - s/p 3 months voriconazole Hypotension, resolved PSVT LUE edema, improved AKI, resolved Hypernatremia - resolved Hypervolemia Chronic dysphagia G tube  present Breast cancer status post chemotherapy/radiation ICU acquired anemia without acute blood loss ESBL E coli in urine 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 DM 2, control has worsened with increased intake (full liquids in addition to  Long term systemic  steroids Chronic depression Acute encephalopathy, resolved  Severe chronic debilitation  PLAN/REC: Cont supplemental O2 to maintain SpO2 90-95% Cont nebulized bronchodilators Add nebulized steroids 08/05 Cont diltiazem per tube Monitor BMET intermittently Correct electrolytes as indicated SUP: enteral famotidine Cont TFs - decrease rate 08/05 due to initiation of full liquid diet Advance POs as able SLP eval 08/04 - transition to PO diet as able DVT px: enoxaparin Monitor CBC intermittently Cont Ceftaz - she will need prolonged antibiotics (planned through 07/07/16 to complete 4 weeks).   Duration to be definitively determined by radiographic and clinical response Monitor temp, WBC count Micro and abx as above Cont SSI and Lantus Cont prednisone 5 mg daily Continue PT Pursuing SNF - earliest would be first of next week   Merton Border, MD PCCM service Mobile [phone] Pager [phone]  06/18/2016

## 2016-06-18 NOTE — Progress Notes (Signed)
Patient resting in bed. Received prn morphine with some relief. Linen changed and pt repositioned to right side. New flexiseal placed.

## 2016-06-19 ENCOUNTER — Inpatient Hospital Stay: Payer: Medicare Other

## 2016-06-19 LAB — BASIC METABOLIC PANEL
ANION GAP: 4 — AB (ref 5–15)
BUN: 14 mg/dL (ref 6–20)
CHLORIDE: 99 mmol/L — AB (ref 101–111)
CO2: 36 mmol/L — AB (ref 22–32)
Calcium: 9 mg/dL (ref 8.9–10.3)
Creatinine, Ser: <0.3 mg/dL — ABNORMAL LOW (ref 0.44–1.00)
GLUCOSE: 108 mg/dL — AB (ref 65–99)
POTASSIUM: 3.6 mmol/L (ref 3.5–5.1)
Sodium: 139 mmol/L (ref 135–145)

## 2016-06-19 LAB — CBC
HEMATOCRIT: 25.2 % — AB (ref 35.0–47.0)
HEMOGLOBIN: 8.5 g/dL — AB (ref 12.0–16.0)
MCH: 31 pg (ref 26.0–34.0)
MCHC: 33.7 g/dL (ref 32.0–36.0)
MCV: 92 fL (ref 80.0–100.0)
Platelets: 160 10*3/uL (ref 150–440)
RBC: 2.74 MIL/uL — ABNORMAL LOW (ref 3.80–5.20)
RDW: 16.1 % — ABNORMAL HIGH (ref 11.5–14.5)
WBC: 3.7 10*3/uL (ref 3.6–11.0)

## 2016-06-19 LAB — GLUCOSE, CAPILLARY
Glucose-Capillary: 115 mg/dL — ABNORMAL HIGH (ref 65–99)
Glucose-Capillary: 138 mg/dL — ABNORMAL HIGH (ref 65–99)
Glucose-Capillary: 139 mg/dL — ABNORMAL HIGH (ref 65–99)
Glucose-Capillary: 227 mg/dL — ABNORMAL HIGH (ref 65–99)
Glucose-Capillary: 271 mg/dL — ABNORMAL HIGH (ref 65–99)
Glucose-Capillary: 273 mg/dL — ABNORMAL HIGH (ref 65–99)

## 2016-06-19 LAB — TSH: TSH: 0.386 u[IU]/mL (ref 0.350–4.500)

## 2016-06-19 MED ORDER — METHYLPREDNISOLONE SODIUM SUCC 40 MG IJ SOLR
20.0000 mg | Freq: Two times a day (BID) | INTRAMUSCULAR | Status: DC
  Administered 2016-06-19 – 2016-06-22 (×7): 20 mg via INTRAVENOUS
  Filled 2016-06-19 (×7): qty 1

## 2016-06-19 MED ORDER — SODIUM CHLORIDE 0.9% FLUSH
10.0000 mL | INTRAVENOUS | Status: DC | PRN
  Administered 2016-06-22 (×2): 20 mL
  Administered 2016-06-24 – 2016-06-25 (×2): 10 mL
  Filled 2016-06-19 (×4): qty 40

## 2016-06-19 MED ORDER — SODIUM CHLORIDE 0.9% FLUSH
10.0000 mL | Freq: Two times a day (BID) | INTRAVENOUS | Status: DC
  Administered 2016-06-20 – 2016-06-21 (×3): 10 mL
  Administered 2016-06-21 – 2016-06-22 (×2): 20 mL
  Administered 2016-06-22 – 2016-06-26 (×8): 10 mL

## 2016-06-19 MED ORDER — SODIUM CHLORIDE 0.9% FLUSH
3.0000 mL | Freq: Two times a day (BID) | INTRAVENOUS | Status: DC
  Administered 2016-06-19 – 2016-06-26 (×9): 3 mL via INTRAVENOUS

## 2016-06-19 NOTE — Consult Note (Addendum)
[place] Nurse wound follow up Wound type: Nonhealing pressure injury sacrum, unstageable center with stage 3 peri wound pressure injury, present on admission. Measurement: necrotic unstageable is 3 cm x 1.5 cm x 0 with 6 cm x 4 cm x 0.1 cm periwound stage 3 pressure injury Wound bed: 20 % tan soft slough area surrounded by beefy red granulation tissue with small areas of pink, pearly tissue. Drainage (amount, consistency, odor) small amount of serosanguinous drainage, no odor Periwound: Blanchable erythema, intact Dressing procedure/placement/frequency: Cleanse with soap and water.  Pat dry. Apply foam dressing.  Change every 3 days and prn soilage.  Discussed POC with patient and bedside nurse.  Will follow up in one week.   Thanks Dorna Bloom BSN, RN, [facility]

## 2016-06-19 NOTE — Progress Notes (Signed)
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 BAL lingula and LLL. Cytology negative for malignancy 07/27 CTAP: Findings worrisome for acute uncomplicated diverticulitis involving the descending colon within the left lower abdominal quadrant. No evidence of perforation or definable/drainable fluid collection on this noncontrast examination. Interval development of rather extensive consolidative wedge-shaped opacities within the left lower lobe with potential central cavitation worrisome for infection and/or aspiration 07/31 Extubated 08/02 Changed to SDU status. Goals of care discussion initiated with pt's husband. PT eval ordered 08/03 Transferred to telemetry. LUE edema noted. L [place] CVL removed and PICC ordered.  08/03 LUE venous US: negative for DVT 08/03 goals of care discussion: DNR in event of cardiac arrest. If re-intubated, short term only. No trach tube or LTACH 08/04 More awake and interactive. SLP eval ordered > full liquid diet ordered 08/05 TF rate decreased 8/7 increased rhonchi and some wheezing  INDWELLING DEVICES:: ETT 07/27 >> 07/31 L [place] CVL 07/27 >> 08/03 RUE PICC 08/03 >>   MICRO DATA: Urine 07/26 >> ESBL E coli, carbapenem resistant klebsiella Blood 07/26 >> NEG MRSA PCR 07/27 >> NEG Resp (BAL) 07/27 >> pseudomonas (pansensitive)  >> AFB smear neg  >> fungal smear neg Urine 08/21 >> 6-30 WBC/HPF  ANTIMICROBIALS:  Vanc 07/26 >> 07/27 Pip-tazo 07/26 >> 07/29 Metronidazole 07/27 >>  07/28 Cipro 07/27 >> 07/28 Voriconazole 07/27 >> 08/01 Meropenem 07/29 >> 07/31 Ceftaz 07/31 >>   SUBJECTIVE:  RASS 0. + F/C. No distress. No new complaints, not feeling well   VITAL SIGNS: Temp:  [97.6 F (36.4 C)-98.2 F (36.8 C)] 98.2 F (36.8 C) (07/31 0800) Pulse Rate:  [58-147] 76 (07/31 1000) Resp:  [12-27] 27 (07/31 1000) BP: (94-185)/(31-167) 124/55 (07/31 1000) SpO2:  [93 %-100 %] 100 % (07/31 1000) FiO2 (%):  [25 %] 25 % (07/31 0809) Weight:  [193 lb 2 oz (87.6 kg)] 193 lb 2 oz (87.6 kg) (07/31 0232)  PHYSICAL EXAMINATION:  Gen: Obese, NAD Neuro: CNs intact, MAEs HEENT: NCAT Neck: JVP cannot be assessed Pulmonary: few scattered wheezes Cardiovascular: occasional extrasystoles, no M Abdomen: Obese, soft, NT, +BS Extremities: 1+ symmetric pretibial edema  BMP Latest Ref Rng & Units 06/19/2016 06/15/2016 06/13/2016  Glucose 65 - 99 mg/dL 108(H) 131(H) 179(H)  BUN 6 - 20 mg/dL 14 20 27(H)  Creatinine 0.44 - 1.00 mg/dL <0.30(L) <0.30(L) <0.30(L)  Sodium 135 - 145 mmol/L 139 139 146(H)  Potassium 3.5 - 5.1 mmol/L 3.6 3.6 3.4(L)  Chloride 101 - 111 mmol/L 99(L) 103 109  CO2 22 - 32 mmol/L 36(H) 32 32  Calcium 8.9 - 10.3 mg/dL 9.0 8.9 9.0   CBC Latest Ref Rng & Units 06/19/2016 06/13/2016 06/12/2016  WBC 3.6 - 11.0 K/uL 3.7 6.3 5.6  Hemoglobin 12.0 - 16.0 g/dL 8.5(L) 8.6(L) 8.9(L)  Hematocrit 35.0 - 47.0 % 25.2(L) 25.6(L) 26.8(L)  Platelets 150 - 440 K/uL 160 107(L) 100(L)   CXR: NNF  IMPRESSION: Acute on chronic respiratory failure OSA/OHS COPD with wheezing LLL cavitary PNA due to pseudomonas H/O aspergillus - s/p 3 months voriconazole Hypotension, resolved PSVT LUE edema, improved AKI,  resolved Hypernatremia - resolved Hypervolemia Chronic dysphagia G tube present Breast cancer status post chemotherapy/radiation ICU acquired anemia without acute blood loss ESBL E coli in urine 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 DM 2, control has worsened with  increased intake (full liquids in addition to  Long term systemic steroids Chronic depression Acute encephalopathy, resolved  Severe chronic debilitation  PLAN/REC: Cont supplemental O2 to maintain SpO2 90-95% Cont nebulized bronchodilators Add nebulized steroids 08/05 Cont diltiazem per tube Monitor BMET intermittently Correct electrolytes as indicated SUP: enteral famotidine Cont TFs - decrease rate 08/05 due to initiation of full liquid diet Advance POs as able SLP eval 08/04 - transition to PO diet as able DVT px: enoxaparin Monitor CBC intermittently Cont Ceftaz - she will need prolonged antibiotics (planned through 07/07/16 to complete 4 weeks).   Duration to be definitively determined by radiographic and clinical response Monitor temp, WBC count Micro and abx as above Cont SSI and Lantus Will increased steroids to solumedrol 20 mg BID Continue PT Pursuing SNF - earliest would be first of next week     The Patient requires high complexity decision making for assessment and support, frequent evaluation and titration of therapies.  [name], M.D.  [facility]  Medical Director [place] Director [facility] Cardio-Pulmonary Department

## 2016-06-19 NOTE — Clinical Social Work Note (Signed)
MSW presented bed offers to patient's husband, and he chose [employer]  MSW contacted [facility] who can accept patient once she is medically ready for discharge and orders have been received.  MSW to continue to follow patient's progress throughout discharge planning.  Jones Broom. Caron Ode, MSW [phone]  Mon-Fri 8a-4:30p 06/19/2016 12:14 PM

## 2016-06-19 NOTE — Clinical Social Work Placement (Addendum)
   CLINICAL SOCIAL WORK PLACEMENT  NOTE  Date:  06/19/2016  Patient Details  Name: Ebony [name] MRN: [id number] Date of Birth: [birth year]  Clinical Social Work is seeking post-discharge placement for this patient at the [place] level of care (*CSW will initial, date and re-position this form in  chart as items are completed):  Yes   Patient/family provided with [place] Work Department's list of facilities offering this level of care within the geographic area requested by the patient (or if unable, by the patient's family).  Yes   Patient/family informed of their freedom to choose among providers that offer the needed level of care, that participate in Medicare, Medicaid or managed care program needed by the patient, have an available bed and are willing to accept the patient.  Yes   Patient/family informed of [facility]'s ownership interest in [facility] and [facility], as well as of the fact that they are under no obligation to receive care at these facilities.  PASRR submitted to EDS on 06/16/16     PASRR number received on 06/16/16     Existing PASRR number confirmed on       FL2 transmitted to all facilities in geographic area requested by pt/family on 06/16/16     FL2 transmitted to all facilities within larger geographic area on       Patient informed that his/her managed care company has contracts with or will negotiate with certain facilities, including the following:         06-19-16   Patient/family informed of bed offers received.  (Updated Evette Cristal, MSW, 06-26-16)  Patient chooses bed at  [facility] (Updated Evette Cristal, MSW, 06-26-16)     Physician recommends and patient chooses bed at      Patient to be transferred to  [facility] on  06-26-16 (Updated Evette Cristal, MSW, 06-26-16).  Patient to be transferred to facility by  [facility] (Updated Evette Cristal, MSW, 06-26-16)    Patient family  notified on  06-26-16 of transfer (Updated Evette Cristal, MSW, 06-26-16).  Name of family member notified:   Left message on patient's husband's Ebony [name] phone [phone] (Updated Evette Cristal, MSW, 06-26-16)      PHYSICIAN Please sign FL2     Additional Comment:    _______________________________________________ Ross Ludwig 06/19/2016, 9:13 AM (Updated Evette Cristal, MSW, 06-26-16)

## 2016-06-19 NOTE — Progress Notes (Addendum)
Physical Therapy Treatment Patient Details Name: Ebony [name] MRN: [id number] DOB: [birth year] Today's Date: 06/19/2016    History of Present Illness presented to ER secondary to urinary retention, constipation; admitted with sepsis related to L LL PNA (pseudomonas) and acute diverticulitis.  Hospital course significant for intubation 7/27-7/31 (now on supplemental O2 via [place]), bronchoscopy 7/27.  Of note, patient with previous hospitalization with prolonged intubation, resulting in trach (now removed and healed) and PEG with discharge to [facility]; returned home mid-July and has been bed-bound since, as patient unable to safety assist with [place] activities.    PT Comments    Patient noted in R sidelying upon arrival to room; requests assist in repositioning. Unable to complete indep due to profound functional weakness (total assist from therapist required).  Total care +1-2 for all self-care and functional activities at this time.  Patient motivated to participate as able, but remains very limited in ability to actively assist with basic therex and transfers; will continue to progress therex, transfers and tolerance to upright activity as appropriate. Additional activity deferred at this time due to additional bathing activities and breakfast tray arriving.   Follow Up Recommendations  SNF     Equipment Recommendations       Recommendations for Other Services       Precautions / Restrictions Precautions Precautions: Fall Precaution Comments: Contact isolation, PEG, sacral ulcer Restrictions Weight Bearing Restrictions: No    Mobility  Bed Mobility Overal bed mobility: Needs Assistance;+2 for physical assistance Bed Mobility: Rolling Rolling: Total assist;+2 for physical assistance            Transfers                 General transfer comment: unsafe/unable  Ambulation/Gait             General Gait Details: unsafe/unable   Stairs            Wheelchair  Mobility    Modified Rankin (Stroke Patients Only)       Balance                                    Cognition Arousal/Alertness: Awake/alert Behavior During Therapy: WFL for tasks assessed/performed Overall Cognitive Status: Within Functional Limits for tasks assessed                      Exercises Other Exercises Other Exercises: Supine LE therex, 1x10, act assist ROM: hip flex/ext/abduct/adduct, knee flex/ext, ankle PF/DF.  Profoundly weak in bilat LEs. Other Exercises: Rolling bilat, total assist +1-2 for rotation.  Improved ability to mobilize UEs against gravity to reach for rails, but lacks functional strength necessary to initiate rotation; unable to actively utilize LEs with movement transition due to continued weakness.  Total care of second person for hygiene (after incontinent bladder episode), clothing and linen change.    General Comments        Pertinent Vitals/Pain Faces Pain Scale: Hurts even more Pain Location: back, bilat LEs Pain Descriptors / Indicators: Aching;Sore;Grimacing Pain Intervention(s): Limited activity within patient's tolerance;Monitored during session;Repositioned    Home Living                      Prior Function            PT Goals (current goals can now be found in the care plan section) Acute Rehab PT Goals Patient  Stated Goal: patient did not verbalize specific goal; husband voices desire to participate with PT to 'build her strength and muscle mass up' PT Goal Formulation: With patient/family Time For Goal Achievement: 06/28/16 Potential to Achieve Goals: Fair Progress towards PT goals: Progressing toward goals    Frequency  Min 2X/week    PT Plan Current plan remains appropriate    Co-evaluation             End of Session   Activity Tolerance: Patient limited by fatigue Patient left: in bed (CNA at bedside to complete bathing routine and set up for breakfast)     Time:  [id number] PT Time Calculation (min) (ACUTE ONLY): 14 min  Charges:  $Therapeutic Activity: 8-22 mins                    G Codes:      Genelda Roark H. Owens Shark, PT, DPT, NCS 06/19/16, 9:33 AM [phone]

## 2016-06-19 NOTE — Progress Notes (Signed)
Speech Therapy Note: reviewed chart notes; CXR and labs/vitals. MD has noted min rhonchi but no changes in labs/vitals and CXR. TFs were addressed by MD over the weekend as full liquid diet was introduced. Consulted Dietician re: pt's status as pt remains on TFs via PEG. Will f/u w/ toleration of diet and trials to upgrade diet consistency as appropriate tomorrow w/ Dietician. Recommend continued aspiration precautions w/ any oral intake.

## 2016-06-19 NOTE — Clinical Social Work Note (Signed)
Clinical Social Work Assessment  Patient Details  Name: Ebony [name] MRN: [id number] Date of Birth: 1946-03-29  Date of referral:  06/16/16               Reason for consult:  Facility Placement                Permission sought to share information with:  Facility Sport and exercise psychologist Permission granted to share information::  Yes, Verbal Permission Granted  Name::     Ebony [name] [phone]   Agency::  SNF admissions  Relationship::     Contact Information:     Housing/Transportation Living arrangements for the past 2 months:  Single Family Home Source of Information:  Patient, Spouse Patient Interpreter Needed:  None Criminal Activity/Legal Involvement Pertinent to Current Situation/Hospitalization:  No - Comment as needed Significant Relationships:  Adult Children, Spouse Lives with:  Spouse Do you feel safe going back to the place where you live?  No Need for family participation in patient care:  Yes (Comment) (Patient requests her husband's input on SNF placement)  Care giving concerns:  Patient and her husband feels she needs some short term rehab before she I sable to return back home.   Social Worker assessment / plan:  Patient is a 70 year old female who is married and lives with her husband.  Patient is alert and oriented x4 and able to express her feelings.  Patient stated she has not been to rehab in the past, MSW explained to patient what to expect and how insurance will pay for her stay.  Patient requested that MSW contact her husband to discuss SNF placement.  MSW contacted her husband and talked to him about what would happen day of discharge from hospital and once she gets to the SNF.  Patient's husband is in agreement to having patient go to a SNF as is patient.  MSW explained to the husband how she would get there and what is involved with SNF placement.  MSW was given permission to fax patient's information to [facility], patient's  husband preferred [place].  Employment status:  Retired Nurse, adult PT Recommendations:  [place] / Referral to community resources:  [place]  Patient/Family's Response to care:  Patient and family in agreement to going to SNF for short term rehab.  Patient/Family's Understanding of and Emotional Response to Diagnosis, Current Treatment, and Prognosis:  Patient expressed that she is frustrated at how sick she is, but she is trying to stay positive so she can get to SNF for rehab and then return back home.  Emotional Assessment Appearance:  Appears stated age Attitude/Demeanor/Rapport:    Affect (typically observed):    Orientation:  Oriented to Self, Oriented to Place, Oriented to  Time, Oriented to Situation Alcohol / Substance use:  Not Applicable Psych involvement (Current and /or in the community):  No (Comment)  Discharge Needs  Concerns to be addressed:  Lack of Support Readmission within the last 30 days:  No Current discharge risk:  Lack of support system Barriers to Discharge:  No Barriers Identified   Ross Ludwig 06/19/2016, 9:07 AM

## 2016-06-19 NOTE — Progress Notes (Signed)
Pharmacy Antibiotic Note  Ebony [name] is a 70 y.o. female admitted on 06/07/2016 with ESBL UTI and LLL cavitary lesion.  Pharmacy has been consulted for ceftazidime dosing. (ESBL and Klebsiella in urine may represent colonization-Klebsiella pan resistant.) 7/27- Bronch cx: + Pseudomonas aeruginosa- pan sensitive  Plan: Continue ceftazidime 2 g iv q 8 hours. (started 06/12/16)  Per Dr. Alva Garnet note 8/6: Cont Ceftaz - she will need prolonged antibiotics (planned through 07/07/16 to complete 4 weeks).  Duration to be definitively determined by radiographic and clinical response    Height: 5\' 2"  (157.5 cm) Weight: 197 lb 6.4 oz (89.5 kg) IBW/kg (Calculated) : 50.1  Temp (24hrs), Avg:98.5 F (36.9 C), Min:98.2 F (36.8 C), Max:98.9 F (37.2 C)   Recent Labs Lab 06/12/16 1714 06/13/16 0500 06/15/16 0436 06/19/16 0544  WBC  --  6.3  --  3.7  CREATININE 0.37* <0.30* <0.30* <0.30*    CrCl cannot be calculated (This lab value cannot be used to calculate CrCl because it is not a number: <0.30).    Allergies  Allergen Reactions  . Other Hives and Rash    Shellfish. Shellfish Pt reports no allergic reaction to iodine or betadine.  . Shellfish Allergy Hives and Rash    Vanc 07/26 >> 07/27 Pip-tazo 07/26 >> 07/29 Metronidazole 07/27 >> 07/28 Cipro 07/27 >> 07/28 Voriconazole 07/27 >> 08/01 Meropenem 07/29 >> 07/31 Ceftaz 07/31 >>    Microbiology results: 7/26 BCx: NG 7/26 UCx: ESBL positive E.coli-resist to Cipro, CTX, cefazolin, amp. And Klebsiella pneumoniae pan-resistant 7/27 BAL: Pseudomonas aeruginosa pan-sensitive 7/27 MRSA PCR: negative  Thank you for allowing pharmacy to be a part of this patient's care.  Noralee Space, PharmD Clinical Pharmacist 06/19/2016 8:54 AM

## 2016-06-19 NOTE — Care Management Important Message (Signed)
Important Message  Patient Details  Name: Ebony [name] MRN: [id number] Date of Birth: 1946-03-12   Medicare Important Message Given:  Yes    Jolly Mango, RN 06/19/2016, 9:29 AM

## 2016-06-20 LAB — GASTROINTESTINAL PANEL BY PCR, STOOL (REPLACES STOOL CULTURE)
ADENOVIRUS F40/41: NOT DETECTED
Astrovirus: NOT DETECTED
CAMPYLOBACTER SPECIES: NOT DETECTED
CRYPTOSPORIDIUM: NOT DETECTED
CYCLOSPORA CAYETANENSIS: NOT DETECTED
E. coli O157: NOT DETECTED
ENTAMOEBA HISTOLYTICA: NOT DETECTED
ENTEROAGGREGATIVE E COLI (EAEC): NOT DETECTED
ENTEROPATHOGENIC E COLI (EPEC): NOT DETECTED
Enterotoxigenic E coli (ETEC): NOT DETECTED
GIARDIA LAMBLIA: NOT DETECTED
Norovirus GI/GII: NOT DETECTED
PLESIMONAS SHIGELLOIDES: NOT DETECTED
Rotavirus A: NOT DETECTED
SALMONELLA SPECIES: NOT DETECTED
SHIGELLA/ENTEROINVASIVE E COLI (EIEC): NOT DETECTED
Sapovirus (I, II, IV, and V): NOT DETECTED
Shiga like toxin producing E coli (STEC): NOT DETECTED
VIBRIO SPECIES: NOT DETECTED
Vibrio cholerae: NOT DETECTED
YERSINIA ENTEROCOLITICA: NOT DETECTED

## 2016-06-20 LAB — C DIFFICILE QUICK SCREEN W PCR REFLEX
C DIFFICLE (CDIFF) ANTIGEN: NEGATIVE
C Diff interpretation: NOT DETECTED
C Diff toxin: NEGATIVE

## 2016-06-20 LAB — GLUCOSE, CAPILLARY
Glucose-Capillary: 225 mg/dL — ABNORMAL HIGH (ref 65–99)
Glucose-Capillary: 230 mg/dL — ABNORMAL HIGH (ref 65–99)
Glucose-Capillary: 250 mg/dL — ABNORMAL HIGH (ref 65–99)
Glucose-Capillary: 265 mg/dL — ABNORMAL HIGH (ref 65–99)
Glucose-Capillary: 300 mg/dL — ABNORMAL HIGH (ref 65–99)
Glucose-Capillary: 304 mg/dL — ABNORMAL HIGH (ref 65–99)

## 2016-06-20 MED ORDER — INSULIN GLARGINE 100 UNIT/ML ~~LOC~~ SOLN
22.0000 [IU] | Freq: Every day | SUBCUTANEOUS | Status: DC
Start: 2016-06-20 — End: 2016-06-21
  Administered 2016-06-20: 22 [IU] via SUBCUTANEOUS
  Filled 2016-06-20 (×2): qty 0.22

## 2016-06-20 MED ORDER — OSMOLITE 1.5 CAL PO LIQD
1000.0000 mL | ORAL | Status: DC
  Administered 2016-06-20 – 2016-06-26 (×6): 1000 mL

## 2016-06-20 NOTE — Progress Notes (Signed)
[facility] [place] NAME: Ebony [name]    MR#:  [id number]  DATE OF BIRTH:  10/07/1946  SUBJECTIVE:  CHIEF COMPLAINT:   Chief Complaint  Patient presents with  . Constipation  . Other    decreased urine output  The patient is 70 year old Caucasian female with past medical history significant for history of breast cancer, COPD, depression, diabetes mellitus type 2, hypertension, hyperlipidemia, hypothyroidism, who presents to the hospital with complaints of urinary retention and constipation. She was found to be hypotensive and was admitted to the hospital for further evaluation. Due to abdominal discomfort. Patient had CT scan of abdomen and pelvis revealing acute diverticulitis, chest x-ray revealed a left lower lobe airspace disease, worrisome for pneumonia. In the hospital, she underwent bronchoscopic evaluation, cytology was negative for malignancy. Urine cultures revealed more than 100,000 colony-forming units of Escherichia coli and Klebsiella pneumonia resistant to multiple antibiotics, patient is on Fortaz at present and is improving.   Review of Systems  Unable to perform ROS: Language  Constitutional: Positive for malaise/fatigue.    VITAL SIGNS: Blood pressure 114/79, pulse 92, temperature 98.4 F (36.9 C), temperature source Oral, resp. rate 18, height 5\' 2"  (1.575 m), weight 87.9 kg (193 lb 11.2 oz), SpO2 98 %.  PHYSICAL EXAMINATION:   GENERAL:  70 y.o.-year-old patient lying in the bed with no acute distress. Pale, chronically ill-looking. Poor verbal interaction, although able to answer some questions briefly EYES: Pupils equal, round, reactive to light and accommodation. No scleral icterus. Extraocular muscles intact.  HEENT: Head atraumatic, normocephalic. Oropharynx and nasopharynx clear.  NECK:  Supple, no jugular venous distention. No thyroid enlargement, no tenderness.  LUNGS: Send breath sounds bilaterally, no  wheezing, scattered rales,rhonchi and crepitations bilaterally anteriorly. Intermittent use of accessory muscles of respiration.  CARDIOVASCULAR: S1, S2 normal. No murmurs, rubs, or gallops.  ABDOMEN: Soft, nontender, nondistended. PEG tube is noted in upper abdomen . Bowel sounds present. No organomegaly or mass.  EXTREMITIES: Trace lower extremity and pedal edema, no cyanosis, or clubbing.  NEUROLOGIC: Cranial nerves II through XII are intact. Muscle strength 5/5 in all extremities. Sensation intact. Gait not checked.  PSYCHIATRIC: The patient is alert and oriented x 3. Has difficulty speaking SKIN: No obvious rash, lesion, or ulcer.   ORDERS/RESULTS REVIEWED:   CBC  Recent Labs Lab 06/19/16 0544  WBC 3.7  HGB 8.5*  HCT 25.2*  PLT 160  MCV 92.0  MCH 31.0  MCHC 33.7  RDW 16.1*   ------------------------------------------------------------------------------------------------------------------  Chemistries   Recent Labs Lab 06/15/16 0436 06/19/16 0544  NA 139 139  K 3.6 3.6  CL 103 99*  CO2 32 36*  GLUCOSE 131* 108*  BUN 20 14  CREATININE <0.30* <0.30*  CALCIUM 8.9 9.0   ------------------------------------------------------------------------------------------------------------------ CrCl cannot be calculated (This lab value cannot be used to calculate CrCl because it is not a number: <0.30). ------------------------------------------------------------------------------------------------------------------  Recent Labs  06/19/16 0544  TSH 0.386    Cardiac Enzymes No results for input(s): CKMB, TROPONINI, MYOGLOBIN in the last 168 hours.  Invalid input(s): CK ------------------------------------------------------------------------------------------------------------------ Invalid input(s): POCBNP ---------------------------------------------------------------------------------------------------------------  RADIOLOGY: Dg Chest Port 1 View  Result Date:  06/19/2016 CLINICAL DATA:  Respiratory failure. EXAM: PORTABLE CHEST 1 VIEW COMPARISON:  06/15/2016. FINDINGS: Interim removal of left subclavian line. Right PICC line in stable position. Heart size normal. Mild improvement of left base infiltrate. Small left pleural effusion IMPRESSION: 1. Interim removal of left subclavian line. Right PICC line in  stable position. 2. Left base atelectasis and/or infiltrate with slight interim improvement. Electronically Signed   By: Marcello Moores  Register   On: 06/19/2016 08:03    EKG:  Orders placed or performed during the hospital encounter of 06/07/16  . EKG 12-Lead  . EKG 12-Lead  . EKG 12-Lead  . EKG 12-Lead    ASSESSMENT AND PLAN:  Active Problems:   Hypotension #1. Sepsis due to left lower lobe cavitary pneumonia due to Pseudomonas, urinary tract infection, blood cultures and negative, continue patient on Fortaz for ESBL Escherichia coli and Klebsiella UTI, she will need prolonged antibiotic therapy, planned through 25th of August 2017 to complete 4 week therapy, duration should be determined by radiographic and clinical response #2. Malnutrition, with palliative care involved for further recommendations as discussed this family and patient risks of oral intake, discussed with speech therapist, unable to get modified barium swallow study at this time, continue tube feeds, that is consulted #3. Hypotension, resolved, possibly malnutrition and sepsis related #4. Acute on chronic respiratory failure with hypoxia and mild hypercapnia due to left lower lobe cavitary pneumonia due to Pseudomonas, continue oxygen therapy as needed, weaning off oxygen as tolerated, now on 2 L of oxygen through nasal cannula with good oxygenation #5. Diarrhea, get stool cultures, including PCR for C. difficile   Management plans discussed with the patient, family and they are in agreement.   DRUG ALLERGIES:  Allergies  Allergen Reactions  . Other Hives and Rash    Shellfish.  Shellfish Pt reports no allergic reaction to iodine or betadine.  . Shellfish Allergy Hives and Rash    CODE STATUS:     Code Status Orders        Start     Ordered   06/16/16 0954  Limited resuscitation (code)  Continuous    Question Answer Comment  In the event of cardiac or respiratory ARREST: Initiate Code Blue, Call Rapid Response Yes   In the event of cardiac or respiratory ARREST: Perform CPR No   In the event of cardiac or respiratory ARREST: Perform Intubation/Mechanical Ventilation Yes   In the event of cardiac or respiratory ARREST: Use NIPPV/BiPAp only if indicated Yes   In the event of cardiac or respiratory ARREST: Administer ACLS medications if indicated No   In the event of cardiac or respiratory ARREST: Perform Defibrillation or Cardioversion if indicated No      06/16/16 0953    Code Status History    Date Active Date Inactive Code Status Order ID Comments User Context   06/08/2016  4:16 AM 06/16/2016  9:53 AM Full Code [id number]  Harrie Foreman, MD Inpatient   02/27/2016 11:30 AM 03/22/2016  6:51 PM Full Code [id number]  Dustin Flock, MD ED   10/07/2015 11:30 AM 10/12/2015  5:35 PM Full Code [id number]  Theodoro Grist, MD Inpatient      TOTAL TIME TAKING CARE OF THIS PATIENT: 40 minutes.    Theodoro Grist M.D on 06/20/2016 at 2:45 PM  Between 7am to 6pm - Pager - [phone]  After 6pm go to [email or web address] - password [email or web address] [place] [facility]  Office  [phone]  CC: Primary care physician; Renee Rival, NP

## 2016-06-20 NOTE — Progress Notes (Signed)
Physical Therapy Treatment Patient Details Name: Ebony [name] MRN: [id number] DOB: 1946-02-22 Today's Date: 06/20/2016    History of Present Illness presented to ER secondary to urinary retention, constipation; admitted with sepsis related to L LL PNA (pseudomonas) and acute diverticulitis.  Hospital course significant for intubation 7/27-7/31 (now on supplemental O2 via [place]), bronchoscopy 7/27.  Of note, patient with previous hospitalization with prolonged intubation, resulting in trach (now removed and healed) and PEG with discharge to [facility]; returned home mid-July and has been bed-bound since, as patient unable to safety assist with [place] activities.    PT Comments    Participated in exercises as described below.  Session stopped as PICC line care needed.   Follow Up Recommendations  SNF     Equipment Recommendations       Recommendations for Other Services       Precautions / Restrictions Precautions Precautions: Fall Precaution Comments: Contact isolation, PEG, sacral ulcer Restrictions Weight Bearing Restrictions: No    Mobility  Bed Mobility                  Transfers                    Ambulation/Gait                 Stairs            Wheelchair Mobility    Modified Rankin (Stroke Patients Only)       Balance                                    Cognition Arousal/Alertness: Awake/alert Behavior During Therapy: WFL for tasks assessed/performed Overall Cognitive Status: Within Functional Limits for tasks assessed                      Exercises Other Exercises Other Exercises: BLE AAROM for ankle pumps, heel slides, AB/Adduction, SLR x 10 reps    General Comments        Pertinent Vitals/Pain Pain Assessment: 0-10 Pain Score: 6  Pain Location: general pain all over Pain Descriptors / Indicators: Aching Pain Intervention(s): Limited activity within patient's tolerance    Home Living                       Prior Function            PT Goals (current goals can now be found in the care plan section) Progress towards PT goals: Progressing toward goals    Frequency  Min 2X/week    PT Plan Current plan remains appropriate    Co-evaluation             End of Session   Activity Tolerance: Patient limited by fatigue Patient left: in bed     Time: 1141-1151 PT Time Calculation (min) (ACUTE ONLY): 10 min  Charges:  $Therapeutic Exercise: 8-22 mins                    G Codes:      Chesley Noon, PTA 06/20/16, 11:57 AM

## 2016-06-20 NOTE — Progress Notes (Signed)
Took over care from Amy, Therapist, sports. Evening time insulin given as well as cardizem. Patient was able to swallow liquid solution with no problems. Complaining of pain in her buttocks, treated with morphine. Currently resting comfortably in bed.

## 2016-06-20 NOTE — Progress Notes (Addendum)
Inpatient Diabetes Program Recommendations  AACE/ADA: New Consensus Statement on Inpatient Glycemic Control (2015)  Target Ranges:  Prepandial:   less than 140 mg/dL      Peak postprandial:   less than 180 mg/dL (1-2 hours)      Critically ill patients:  140 - 180 mg/dL   Results for Ebony [name], Ebony [name] (MRN [id number]) as of 06/20/2016 10:25  Ref. Range 06/19/2016 00:28 06/19/2016 04:18 06/19/2016 07:24 06/19/2016 11:38 06/19/2016 16:49 06/19/2016 21:55  Glucose-Capillary Latest Ref Range: 65 - 99 mg/dL 138 (H) 115 (H) 139 (H) 227 (H) 273 (H) 271 (H)   Results for Ebony [name], Ebony [name] (MRN [id number]) as of 06/20/2016 10:25  Ref. Range 06/20/2016 00:06 06/20/2016 04:28 06/20/2016 07:59  Glucose-Capillary Latest Ref Range: 65 - 99 mg/dL 230 (H) 225 (H) 250 (H)    Home DM Meds: Glipizide 5mg  daily, Levemir 40 units QHS, Humalog 38-48 units TID with meals, Invokana 100 mg daily  Current Insulin Orders: Lantus 18 units QHS      Novolog Moderate Correction Scale/ SSI (0-15 units) Q4 hours      Novolog 4 units Q4 hours      -Note IV Solumedrol 20 mg bid started last PM.  Patient also getting Prednisone 5 mg daily.  -Glucerna tube feeds running at 40cc/ hour.  -Glucose levels elevated >200 mg/dl since 12pm yesterday.     MD- Please consider the following in-hospital insulin adjustments while patient getting IV Solumedrol:  1. Increase Lantus to 22 units QHS (20% increase in dose)  2. Increase Novolog tube feed coverage to: Novolog 6 units Q4 hours (hold if tube feeds held for any reason)     --Will follow patient during hospitalization--  Wyn Quaker RN, MSN, CDE Diabetes Coordinator Inpatient Glycemic Control Team Team Pager: [phone] (8a-5p)

## 2016-06-20 NOTE — Progress Notes (Signed)
  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  --Continue bipap qhs.   COPD acute bronchitis-- better.  --On long term steroids-- currently on solumedrol 20 mg IV q12. Wean down/off as this will make the pt's aspergillus harder to treat.   LLL cavitary PNA due to pseudomonas --Cont Ceftaz - she will need prolonged antibiotics (planned through 07/07/16 to complete 4 weeks).  --H/O aspergillus - s/p 3 months voriconazoleChronic dysphagia G tube present  Severe chronic debilitation --Continue to advance activity as tolerated.  --SNF - proceed when available.    INDWELLING DEVICES:: ETT 07/27 >> 07/31 L [place] CVL 07/27 >> 08/03 RUE PICC 08/03 >>   MICRO DATA: Urine 07/26 >> ESBL E coli, carbapenem resistant klebsiella Blood 07/26 >> NEG MRSA PCR 07/27 >> NEG Resp (BAL) 07/27 >> pseudomonas (pansensitive)  >> AFB smear neg  >> fungal smear neg Urine 08/21 >> 6-30 WBC/HPF  ANTIMICROBIALS:  Vanc 07/26 >> 07/27 Pip-tazo 07/26 >> 07/29 Metronidazole 07/27 >> 07/28 Cipro 07/27 >> 07/28 Voriconazole 07/27 >> 08/01 Meropenem 07/29 >> 07/31 Ceftaz 07/31 >>   SUBJECTIVE:  RASS 0. + F/C. No distress. No new complaints, not feeling well. Case discussed with patient and pt's husband at bedside.    VITAL SIGNS: Temp:  [97.6 F (36.4 C)-98.2 F (36.8 C)] 98.2 F (36.8 C) (07/31 0800) Pulse Rate:  [58-147] 76 (07/31 1000) Resp:  [12-27] 27 (07/31 1000) BP: (94-185)/(31-167) 124/55 (07/31 1000) SpO2:  [93 %-100 %] 100 % (07/31 1000) FiO2  (%):  [25 %] 25 % (07/31 0809) Weight:  [193 lb 2 oz (87.6 kg)] 193 lb 2 oz (87.6 kg) (07/31 0232)  PHYSICAL EXAMINATION:  Gen: Obese, NAD Neuro: CNs intact, MAEs HEENT: NCAT Neck: JVP cannot be assessed Pulmonary: few scattered wheezes Cardiovascular: occasional extrasystoles, no M Abdomen: Obese, soft, NT, +BS Extremities: 1+ symmetric pretibial edema  BMP Latest Ref Rng & Units 06/19/2016 06/15/2016 06/13/2016  Glucose 65 - 99 mg/dL 108(H) 131(H) 179(H)  BUN 6 - 20 mg/dL 14 20 27(H)  Creatinine 0.44 - 1.00 mg/dL <0.30(L) <0.30(L) <0.30(L)  Sodium 135 - 145 mmol/L 139 139 146(H)  Potassium 3.5 - 5.1 mmol/L 3.6 3.6 3.4(L)  Chloride 101 - 111 mmol/L 99(L) 103 109  CO2 22 - 32 mmol/L 36(H) 32 32  Calcium 8.9 - 10.3 mg/dL 9.0 8.9 9.0   CBC Latest Ref Rng & Units 06/19/2016 06/13/2016 06/12/2016  WBC 3.6 - 11.0 K/uL 3.7 6.3 5.6  Hemoglobin 12.0 - 16.0 g/dL 8.5(L) 8.6(L) 8.9(L)  Hematocrit 35.0 - 47.0 % 25.2(L) 25.6(L) 26.8(L)  Platelets 150 - 440 K/uL 160 107(L) 100(L)   CXR: NNF  -Marda Stalker, M.D.  06/20/2016

## 2016-06-20 NOTE — Progress Notes (Addendum)
Nutrition Follow-up  DOCUMENTATION CODES:   Obesity unspecified  INTERVENTION:  -Coordination of Care: discussed plan of care with SLP, pt to remain on FL diet at present, pleasure po essentially at present, pt taking bites and sips. Recommend adjusting TF at present to better meet nutritional needs to promote healing until pt able to consume adequate po. MD Ether Griffins agreeable with this plan. Recommend trial of changing to Osmolite 1.5 to see if this reduces the amount/frequency of stool, recommend goal of 50 ml/hr providing 1800 kcals, 76 g of protein. Recommend continuing Prostat BID providing additional 30 g of protein and 200 kcals. Continue to assess   NUTRITION DIAGNOSIS:   Inadequate oral intake related to acute illness as evidenced by NPO status.  Being addressed via TF and diet  GOAL:   Provide needs based on ASPEN/SCCM guidelines   MONITOR:   TF tolerance, Weight trends, Vent status, Labs  REASON FOR ASSESSMENT:   Consult Enteral/tube feeding initiation and management  ASSESSMENT:   70 y/o female admitted with sepsis, respiratory failure and LLL PNA/cavitary lesions; CT adb with acute diverticulitis as well.  Patient has hx of breast CA s/p radiation therapy completed in March 2017.  PTA pt had a PEG in place but was eating some foods PO per her husband.  She is currently on enteral nutrition as sole source nutrition.  Patient asking about eating PO.  Diet advanced to FL on Friday post SLP eval, limited documentation of po intake. Noted 50% of breakfast meal on 8/6, pt has not taken breakfast this AM. Per Amy RN, night nurse reporting concern regarding possible aspiration with po. SLP planning to re-evaluate.   Glucerna 1.2 TF rate turned down to 40 ml/hr after diet advancement per MD over the weekend. Liquid stool continues, rectal tube in place. Noted pt with acute diverticulitis on CT abdomen on admission  Diet Order:  Diet full liquid Room service appropriate? Yes;  Fluid consistency: Thin  Skin:  Wound (see comment) (unstageable and stage III pressure ulcer on sacrum)  Last BM:  rectal tube remains in place, only 10 mL documented yesterday but per Amy RN significant stool in bag  , stool leaking around rectal tube  Labs:  Glucose Profile:   Recent Labs  06/20/16 0006 06/20/16 0428 06/20/16 0759  GLUCAP 230* 225* 250*   Meds: ss novolog, lantus, solumedrol, prednisone, miralax  Height:   Ht Readings from Last 1 Encounters:  06/19/16 5\' 2"  (1.575 m)    Weight:   Wt Readings from Last 1 Encounters:  06/20/16 193 lb 11.2 oz (87.9 kg)    Filed Weights   06/18/16 0424 06/19/16 0500 06/20/16 0427  Weight: 198 lb (89.8 kg) 197 lb 6.4 oz (89.5 kg) 193 lb 11.2 oz (87.9 kg)    BMI:  Body mass index is 35.43 kg/m.  Estimated Nutritional Needs:   Kcal:  [id number] kcals   Protein:  >/= 90 g  Fluid:  >/= 1.7 L  EDUCATION NEEDS:   No education needs identified at this time  [place], [place], [place] [phone] Pager  [phone] Weekend/On-Call Pager

## 2016-06-20 NOTE — Progress Notes (Signed)
PICC line occluded.  Dr. Ether Griffins aware,  [facility] called to declot.

## 2016-06-20 NOTE — Progress Notes (Signed)
Patient is alert and oriented, no complaints of pain.    [facility] consulted for PICC line.  Both ports are very difficult to flush and must be power flushed when using.    PEG tubing changed and feeding supplement changed as well.  Tolerating feeding well.

## 2016-06-20 NOTE — Progress Notes (Signed)
Speech Language Pathology Treatment: Dysphagia  Patient Details Name: Ebony [name] MRN: [id number] DOB: Oct 19, 1946 Today's Date: 06/20/2016 Time: [id number] SLP Time Calculation (min) (ACUTE ONLY): 45 min  Assessment / Plan / Recommendation Clinical Impression  Pt seen today for ongoing assessment of toleration of diet. Pt has been taking po's of a Full Liquid diet in conjunction w/ her TFs via PEG. Pt is taking sips and bites per report. No initial changes in status after po diet was initiated, however, NSG felt some concern of aspiration w/ po's last evening shift per NSG report. Pt denied any difficulty w/ swallowing. She remains in bed w/ multiple medical issues and significant weakness. Noted min increased respiratory w/ any exertion. Pt positioned upright for po trials of thin liquids via CUP, and purees. Pt completed her own oral care then held CUP to take sips of thin liquids. She was instructed to take rest breaks as needed to avoid any increased WOB. Noted pt followed through w/ instruction and took small sips slowly. SLP alternated w/ 1/2 tsp bites of puree (mostly ice cream). No immediate, overt s/s of aspiration noted w/ the po trials. Pt exhibited a delayed, mild half cough post trials but unsure if related to po trials or just to pt's declined baseline respiratory status. Pt took 4-5 trials of each of the 3 food/liquid offered; no significant intake overall.  Thoroughly discussed pt's presentation and risk for aspiration w/ MD after session. Pt stated she does enjoy having some po intake she says. Pt can follow aspiration precautions as educated but needs some support w/ po intake (feeding) and would benefit from monitoring w/ po's and full positioning upright for any oral intake. MD agreed w/ continuing the PLEASURE po diet at this time in conjunction w/ TFs via PEG. ST will continue to monitor pt's status and assess toleration/safety for po's. Precautions posted. NSG updated. Dietician  following. MD is consulting Palliative Care per her report.    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 She was orally intubated for bronchoscopy on 06/08/16 then extubated on 06/12/16. Pt has been NPO since. She denied any h/o dysphagia but does have a PEG placement and receives TFs for nutrition post extended illness last admission, wounds. Per report, pt was not meeting her nutritional needs orally previously. Pt has been taking bites and sips of the recommended full liquid diet since admission - unsure of quantity. PEG TFs ongoing. Pt verbally conversive and able to follow through w/ tasks (oral care) given support.       SLP Plan  Continue with current plan of care     Recommendations  Diet recommendations: Dysphagia 1 (puree);Thin liquid (full liqiuid diet consistency for Pleasure w/ TFs via PEG) Liquids provided via: Cup;No straw Medication Administration: Via alternative means Supervision: Patient able to self feed;Staff to assist with self feeding;Full supervision/cueing for compensatory strategies (pt can hold cup to drink) Compensations: Minimize environmental distractions;Slow rate;Small sips/bites (rest breaks often to avoid increased WOB/SOB) Postural Changes and/or Swallow Maneuvers: Seated upright 90 degrees;Upright 30-60 min after meal             General recommendations:  (Dietician f/u) Oral Care Recommendations: Oral care BID;Staff/trained caregiver to provide oral care Follow up Recommendations:  (TBD; palliative care) Plan: Continue with current plan of care  [place], MS, CCC-SLP  Ebony [name] 06/20/2016, 3:32 PM

## 2016-06-20 NOTE — Care Management (Signed)
Barrier to discharge: Weakness, diarrhea, PICC line occlusion, pain and swelling in legs/R/O DVT.

## 2016-06-20 NOTE — Care Management Obs Status (Deleted)
[facility] NOTIFICATION   Patient Details  Name: Ebony [name] MRN: [id number] Date of Birth: Jun 16, 1946   Medicare Observation Status Notification Given:  Yes    Jolly Mango, RN 06/20/2016, 11:43 AM

## 2016-06-21 DIAGNOSIS — J850 Gangrene and necrosis of lung: Secondary | ICD-10-CM

## 2016-06-21 LAB — BASIC METABOLIC PANEL
Anion gap: 6 (ref 5–15)
BUN: 18 mg/dL (ref 6–20)
CALCIUM: 8.8 mg/dL — AB (ref 8.9–10.3)
CO2: 32 mmol/L (ref 22–32)
Chloride: 96 mmol/L — ABNORMAL LOW (ref 101–111)
GLUCOSE: 324 mg/dL — AB (ref 65–99)
Potassium: 3.7 mmol/L (ref 3.5–5.1)
Sodium: 134 mmol/L — ABNORMAL LOW (ref 135–145)

## 2016-06-21 LAB — HEMOGLOBIN: Hemoglobin: 7.9 g/dL — ABNORMAL LOW (ref 12.0–16.0)

## 2016-06-21 LAB — GLUCOSE, CAPILLARY
Glucose-Capillary: 246 mg/dL — ABNORMAL HIGH (ref 65–99)
Glucose-Capillary: 309 mg/dL — ABNORMAL HIGH (ref 65–99)
Glucose-Capillary: 317 mg/dL — ABNORMAL HIGH (ref 65–99)
Glucose-Capillary: 255 mg/dL — ABNORMAL HIGH (ref 65–99)
Glucose-Capillary: 313 mg/dL — ABNORMAL HIGH (ref 65–99)
Glucose-Capillary: 334 mg/dL — ABNORMAL HIGH (ref 65–99)

## 2016-06-21 MED ORDER — INSULIN GLARGINE 100 UNIT/ML ~~LOC~~ SOLN
35.0000 [IU] | Freq: Every day | SUBCUTANEOUS | Status: DC
  Administered 2016-06-21: 35 [IU] via SUBCUTANEOUS
  Filled 2016-06-21 (×2): qty 0.35

## 2016-06-21 MED ORDER — CHOLESTYRAMINE LIGHT 4 G PO PACK
4.0000 g | PACK | Freq: Two times a day (BID) | ORAL | Status: DC
  Administered 2016-06-21 – 2016-06-23 (×6): 4 g via ORAL
  Filled 2016-06-21 (×6): qty 1

## 2016-06-21 NOTE — Progress Notes (Signed)
Physical Therapy Treatment Patient Details Name: Ebony [name] MRN: [id number] DOB: 1945/12/31 Today's Date: 06/21/2016    History of Present Illness presented to ER secondary to urinary retention, constipation; admitted with sepsis related to L LL PNA (pseudomonas) and acute diverticulitis.  Hospital course significant for intubation 7/27-7/31 (now on supplemental O2 via [place]), bronchoscopy 7/27.  Of note, patient with previous hospitalization with prolonged intubation, resulting in trach (now removed and healed) and PEG with discharge to [facility]; returned home mid-July and has been bed-bound since, as patient unable to safety assist with [place] activities.    PT Comments    Pt agrees to session.  Participated in exercises as described below.  Limited bed mobility and sitting EOB deferred due to rectal tube.  Follow Up Recommendations  SNF     Equipment Recommendations       Recommendations for Other Services       Precautions / Restrictions Precautions Precautions: Fall Precaution Comments: Contact isolation, PEG, sacral ulcer, rectal tube Restrictions Weight Bearing Restrictions: No    Mobility  Bed Mobility Overal bed mobility: Needs Assistance;+2 for physical assistance Bed Mobility: Rolling Rolling: Total assist;+2 for physical assistance         General bed mobility comments: limited with rectal tube  Transfers                 General transfer comment: unsafe/unable  Ambulation/Gait             General Gait Details: unsafe/unable   Stairs            Wheelchair Mobility    Modified Rankin (Stroke Patients Only)       Balance                                    Cognition Arousal/Alertness: Awake/alert Behavior During Therapy: WFL for tasks assessed/performed Overall Cognitive Status: Within Functional Limits for tasks assessed                      Exercises Other Exercises Other Exercises: BLE AAROM for ankle  pumps, heel slides, AB/Adduction, SLR x 10 reps Other Exercises: BUE AROM for Hand, writs, elbow, shoulder movements    General Comments        Pertinent Vitals/Pain Pain Assessment: 0-10 Pain Score: 6  Pain Location: all over Pain Descriptors / Indicators: Aching Pain Intervention(s): Limited activity within patient's tolerance;Repositioned    Home Living                      Prior Function            PT Goals (current goals can now be found in the care plan section) Progress towards PT goals: Progressing toward goals    Frequency  Min 2X/week    PT Plan Current plan remains appropriate    Co-evaluation             End of Session   Activity Tolerance: Patient limited by fatigue Patient left: in bed;with call bell/phone within reach;with bed alarm set     Time: 1134-1146 PT Time Calculation (min) (ACUTE ONLY): 12 min  Charges:  $Therapeutic Exercise: 8-22 mins                    G Codes:      Chesley Noon, PTA 06/21/16, 1:30 PM

## 2016-06-21 NOTE — Progress Notes (Signed)
[facility] [place] NAME: Ebony [name]    MR#:  [id number]  DATE OF BIRTH:  08/29/1946  SUBJECTIVE:  CHIEF COMPLAINT:   Chief Complaint  Patient presents with  . Constipation  . Other    decreased urine output  The patient is 70 year old Caucasian female with past medical history significant for history of breast cancer, COPD, depression, diabetes mellitus type 2, hypertension, hyperlipidemia, hypothyroidism, who presents to the hospital with complaints of urinary retention and constipation. She was found to be hypotensive and was admitted to the hospital for further evaluation. Due to abdominal discomfort. Patient had CT scan of abdomen and pelvis revealing acute diverticulitis, chest x-ray revealed a left lower lobe airspace disease, worrisome for pneumonia, BAL culture revealed Pseudomonas aeruginosa. In the hospital, she underwent bronchoscopic evaluation, cytology was negative for malignancy. Urine cultures revealed more than 100,000 colony-forming units of Escherichia coli and Klebsiella pneumonia resistant to multiple antibiotics, patient is on Fortaz at present. She feels comfortable today, denies any pain, continues to have diarrheal stool, and other pathogens and C. difficile was tested and it was negative.   Review of Systems  Unable to perform ROS: Language  Constitutional: Positive for malaise/fatigue.    VITAL SIGNS: Blood pressure (!) 119/41, pulse 90, temperature 98.3 F (36.8 C), temperature source Oral, resp. rate 19, height 5\' 2"  (1.575 m), weight 88 kg (194 lb 1.6 oz), SpO2 98 %.  PHYSICAL EXAMINATION:   GENERAL:  70 y.o.-year-old patient lying in the bed with no acute distress. Pale, chronically ill-looking. Better verbal interaction. Mild dry oral mucosa EYES: Pupils equal, round, reactive to light and accommodation. No scleral icterus. Extraocular muscles intact.  HEENT: Head atraumatic, normocephalic.  Oropharynx and nasopharynx clear.  NECK:  Supple, no jugular venous distention. No thyroid enlargement, no tenderness.  LUNGS: Diminished breath sounds bilaterally, no wheezing, scattered rales,rhonchi and crepitations bilaterally anteriorly. Intermittent use of accessory muscles of respiration.  CARDIOVASCULAR: S1, S2 normal. No murmurs, rubs, or gallops.  ABDOMEN: Soft, nontender, nondistended. PEG tube is noted in upper abdomen . Bowel sounds present. No organomegaly or mass.  EXTREMITIES: Trace lower extremity and pedal edema, no cyanosis, or clubbing.  NEUROLOGIC: Cranial nerves II through XII are intact. Muscle strength 5/5 in all extremities. Sensation intact. Gait not checked.  PSYCHIATRIC: The patient is alert and oriented x 3. Has difficulty speaking SKIN: No obvious rash, lesion, or ulcer.   ORDERS/RESULTS REVIEWED:   CBC  Recent Labs Lab 06/19/16 0544 06/21/16 0655  WBC 3.7  --   HGB 8.5* 7.9*  HCT 25.2*  --   PLT 160  --   MCV 92.0  --   MCH 31.0  --   MCHC 33.7  --   RDW 16.1*  --    ------------------------------------------------------------------------------------------------------------------  Chemistries   Recent Labs Lab 06/15/16 0436 06/19/16 0544 06/21/16 0655  NA 139 139 134*  K 3.6 3.6 3.7  CL 103 99* 96*  CO2 32 36* 32  GLUCOSE 131* 108* 324*  BUN 20 14 18   CREATININE <0.30* <0.30* <0.30*  CALCIUM 8.9 9.0 8.8*   ------------------------------------------------------------------------------------------------------------------ CrCl cannot be calculated (This lab value cannot be used to calculate CrCl because it is not a number: <0.30). ------------------------------------------------------------------------------------------------------------------  Recent Labs  06/19/16 0544  TSH 0.386    Cardiac Enzymes No results for input(s): CKMB, TROPONINI, MYOGLOBIN in the last 168 hours.  Invalid input(s):  CK ------------------------------------------------------------------------------------------------------------------ Invalid input(s): POCBNP ---------------------------------------------------------------------------------------------------------------  RADIOLOGY: No results  found.  EKG:  Orders placed or performed during the hospital encounter of 06/07/16  . EKG 12-Lead  . EKG 12-Lead  . EKG 12-Lead  . EKG 12-Lead    ASSESSMENT AND PLAN:  Active Problems:   Hypotension #1. Sepsis due to left lower lobe cavitary pneumonia due to Pseudomonas, urinary tract infection, blood cultures were negative, continue patient on Fortaz for Pseudomonas infection, however, patient has ESBL Escherichia coli and carbapenem resistant Klebsiella UTI, which is  not covered, getting ID involved for recommendations,  the patient will need prolonged antibiotic therapy for pneumonia, according to pulmonologist, planned through 25th of August 2017 to complete 4 week therapy, duration should be determined by radiographic and clinical response, follow-up with pulmonary as outpatient. PICC is placed and functioning.  #2. Malnutrition, getting palliative care involved for further recommendations ,  unable to get modified barium swallow study at this time, per speech therapist, continue tube feeds, dietary is consulted, patient was advised to be careful with oral intake, again, discussed about the risks as well as benefits of oral nutrition, all questions were answered, patient voiced understanding.  #3. Hypotension, resolved, likely malnutrition and sepsis related #4. Acute on chronic respiratory failure with hypoxia and mild hypercapnia due to left lower lobe cavitary pneumonia due to Pseudomonas, continue oxygen therapy as needed, weaning off oxygen as tolerated, now on 2 L of oxygen through nasal cannula with good oxygenation, continue Fortaz, as above, she will need prolonged antibiotic therapy, per pulmonary, getting  ID involved for recommendations.  #5. Diarrhea, stool cultures, including PCR for C. difficile are negative, getting norovirus testing, initiating colchicine twice a day, follow clinically #6 ESBL Escherichia coli and carbapenem resistant Klebsiella UTI, not being covered with current antibiotic therapy, obtaining ID consult for recommendations.   Management plans discussed with the patient, family and they are in agreement.   DRUG ALLERGIES:  Allergies  Allergen Reactions  . Other Hives and Rash    Shellfish. Shellfish Pt reports no allergic reaction to iodine or betadine.  . Shellfish Allergy Hives and Rash    CODE STATUS:     Code Status Orders        Start     Ordered   06/16/16 0954  Limited resuscitation (code)  Continuous    Question Answer Comment  In the event of cardiac or respiratory ARREST: Initiate Code Blue, Call Rapid Response Yes   In the event of cardiac or respiratory ARREST: Perform CPR No   In the event of cardiac or respiratory ARREST: Perform Intubation/Mechanical Ventilation Yes   In the event of cardiac or respiratory ARREST: Use NIPPV/BiPAp only if indicated Yes   In the event of cardiac or respiratory ARREST: Administer ACLS medications if indicated No   In the event of cardiac or respiratory ARREST: Perform Defibrillation or Cardioversion if indicated No      06/16/16 0953    Code Status History    Date Active Date Inactive Code Status Order ID Comments User Context   06/08/2016  4:16 AM 06/16/2016  9:53 AM Full Code [id number]  Harrie Foreman, MD Inpatient   02/27/2016 11:30 AM 03/22/2016  6:51 PM Full Code [id number]  Dustin Flock, MD ED   10/07/2015 11:30 AM 10/12/2015  5:35 PM Full Code [id number]  Theodoro Grist, MD Inpatient      TOTAL TIME TAKING CARE OF THIS PATIENT: 40 minutes.    Theodoro Grist M.D on 06/21/2016 at 4:03 PM  Between 7am to 6pm - Pager - [phone]  After 6pm go to [email or web address] - password [email or web address]  [facility]  Office  [phone]  CC: Primary care physician; Renee Rival, NP

## 2016-06-21 NOTE — Progress Notes (Addendum)
Pharmacy Antibiotic Note  Ebony [name] is a 70 y.o. female admitted on 06/07/2016 with ESBL UTI and LLL cavitary lesion.  Pharmacy has been consulted for ceftazidime dosing. (ESBL and Klebsiella in urine may represent colonization-Klebsiella pan resistant.) 7/27- Bronch cx: + Pseudomonas aeruginosa- pan sensitive  Plan: Continue ceftazidime 2 g iv q 8 hours. (started 06/12/16)  Per Dr. Alva Garnet note 8/6: Cont Ceftaz - she will need prolonged antibiotics (planned through 07/07/16 to complete 4 weeks).  Duration to be definitively determined by radiographic and clinical response    Height: 5\' 2"  (157.5 cm) Weight: 194 lb 1.6 oz (88 kg) IBW/kg (Calculated) : 50.1  Temp (24hrs), Avg:98.3 F (36.8 C), Min:98.2 F (36.8 C), Max:98.3 F (36.8 C)   Recent Labs Lab 06/15/16 0436 06/19/16 0544 06/21/16 0655  WBC  --  3.7  --   CREATININE <0.30* <0.30* <0.30*    CrCl cannot be calculated (This lab value cannot be used to calculate CrCl because it is not a number: <0.30).    Allergies  Allergen Reactions  . Other Hives and Rash    Shellfish. Shellfish Pt reports no allergic reaction to iodine or betadine.  . Shellfish Allergy Hives and Rash    Vanc 07/26 >> 07/27 Pip-tazo 07/26 >> 07/29 Metronidazole 07/27 >> 07/28 Cipro 07/27 >> 07/28 Voriconazole 07/27 >> 08/01 Meropenem 07/29 >> 07/31 Ceftaz 07/31 >>    Microbiology results: 7/26 BCx: NG 7/26 UCx: ESBL positive E.coli. And Klebsiella pneumoniae pan-resistant 7/27 BAL: Pseudomonas aeruginosa pan-sensitive 7/27 MRSA PCR: negative  Thank you for allowing pharmacy to be a part of this patient's care.  Rocky Morel, PharmD Clinical Pharmacist 06/21/2016 1:41 PM

## 2016-06-21 NOTE — Progress Notes (Signed)
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 BAL lingula and LLL. Cytology negative for malignancy 07/27 CTAP: Findings worrisome for acute uncomplicated diverticulitis involving the descending colon within the left lower abdominal quadrant. No evidence of perforation or definable/drainable fluid collection on this noncontrast examination. Interval development of rather extensive consolidative wedge-shaped opacities within the left lower lobe with potential central cavitation worrisome for infection and/or aspiration 07/31 Extubated 08/02 Changed to SDU status. Goals of care discussion initiated with pt's husband. PT eval ordered 08/03 Transferred to telemetry. LUE edema noted. L [place] CVL removed and PICC ordered.  08/03 LUE venous US: negative for DVT 08/03 goals of care discussion: DNR in event of cardiac arrest. If re-intubated, short term only. No trach tube or LTACH 08/04 More awake and interactive. SLP eval ordered > full liquid diet ordered 08/05 TF rate decreased 8/7 increased rhonchi and some wheezing  INDWELLING DEVICES:: ETT 07/27 >> 07/31 L [place] CVL 07/27 >> 08/03 RUE PICC 08/03 >>   MICRO DATA: Urine 07/26 >> ESBL E coli, carbapenem resistant klebsiella Blood 07/26 >> NEG MRSA PCR 07/27 >> NEG Resp (BAL) 07/27 >> pseudomonas (pansensitive)  >> AFB smear neg  >> fungal smear neg Urine 08/21 >> 6-30 WBC/HPF  ANTIMICROBIALS:  Vanc 07/26 >> 07/27 Pip-tazo 07/26 >> 07/29 Metronidazole 07/27 >>  07/28 Cipro 07/27 >> 07/28 Voriconazole 07/27 >> 08/01 Meropenem 07/29 >> 07/31 Ceftaz 07/31 >>   SUBJECTIVE:  RASS 0. + F/C. No distress. No new complaints, wheezing resolved, feeling better on minimal oxygen   VITAL SIGNS: Temp:  [97.6 F (36.4 C)-98.2 F (36.8 C)] 98.2 F (36.8 C) (07/31 0800) Pulse Rate:  [58-147] 76 (07/31 1000) Resp:  [12-27] 27 (07/31 1000) BP: (94-185)/(31-167) 124/55 (07/31 1000) SpO2:  [93 %-100 %] 100 % (07/31 1000) FiO2 (%):  [25 %] 25 % (07/31 0809) Weight:  [193 lb 2 oz (87.6 kg)] 193 lb 2 oz (87.6 kg) (07/31 0232)  PHYSICAL EXAMINATION:  Gen: Obese, NAD Neuro: CNs intact, MAEs HEENT: NCAT Pulmonary: few scattered wheezes Cardiovascular: occasional extrasystoles, no M Abdomen: Obese, soft, NT, +BS Extremities: 1+ symmetric pretibial edema  BMP Latest Ref Rng & Units 06/21/2016 06/19/2016 06/15/2016  Glucose 65 - 99 mg/dL 324(H) 108(H) 131(H)  BUN 6 - 20 mg/dL 18 14 20   Creatinine 0.44 - 1.00 mg/dL <0.30(L) <0.30(L) <0.30(L)  Sodium 135 - 145 mmol/L 134(L) 139 139  Potassium 3.5 - 5.1 mmol/L 3.7 3.6 3.6  Chloride 101 - 111 mmol/L 96(L) 99(L) 103  CO2 22 - 32 mmol/L 32 36(H) 32  Calcium 8.9 - 10.3 mg/dL 8.8(L) 9.0 8.9   CBC Latest Ref Rng & Units 06/21/2016 06/19/2016 06/13/2016  WBC 3.6 - 11.0 K/uL - 3.7 6.3  Hemoglobin 12.0 - 16.0 g/dL 7.9(L) 8.5(L) 8.6(L)  Hematocrit 35.0 - 47.0 % - 25.2(L) 25.6(L)  Platelets 150 - 440 K/uL - 160 107(L)   CXR: NNF    PROFILE: 70 year old female history of breast cancer status post left lumpectomy andradiation therapy, left lung cavity, 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  --Continue bipap qhs.   COPD acute bronchitis-- better.  --On long term steroids-- currently on solumedrol 20 mg IV q12. Wean down/off as  this will make the pt's aspergillus harder to treat.  -oxygen as needed   LLL cavitary PNA due to pseudomonas --Cont Ceftaz - she will need prolonged antibiotics (planned through 07/07/16 to complete 4 weeks).  --H/O aspergillus - s/p 3 months voriconazoleChronic dysphagia G tube present  Severe chronic debilitation --Continue to advance activity as tolerated.  --SNF - proceed when available.   The Patient requires high complexity decision making for assessment and support, frequent evaluation and titration of therapies. No further recs at this point  [name], M.D.  [facility]  Medical Director [place] Director [facility] Cardio-Pulmonary Department

## 2016-06-21 NOTE — Progress Notes (Signed)
Checked to see if pre-authorization would be needed for non-emergent EMS transport. Per [employer] benefits obtained online through [employer], patient has a [employer] Medicare Advantage PPO policy.  Medicare PPO plans do not require pre-auth for non-emergent ground transports using service codes A0426 or A0428.   

## 2016-06-21 NOTE — Progress Notes (Signed)
Inpatient Diabetes Program Recommendations  AACE/ADA: New Consensus Statement on Inpatient Glycemic Control (2015)  Target Ranges:  Prepandial:   less than 140 mg/dL      Peak postprandial:   less than 180 mg/dL (1-2 hours)      Critically ill patients:  140 - 180 mg/dL  Results for Ebony [name], Ebony [name] (MRN [id number]) as of 06/21/2016 08:57  Ref. Range 06/20/2016 07:59 06/20/2016 11:03 06/20/2016 16:51 06/20/2016 20:18 06/20/2016 23:58 06/21/2016 03:55 06/21/2016 07:53  Glucose-Capillary Latest Ref Range: 65 - 99 mg/dL 250 (H) 304 (H) 265 (H) 300 (H) 246 (H) 309 (H) 317 (H)    Review of Glycemic Control  Diabetes history: DM2 Outpatient Diabetes medications: Glipizide 5mg  daily, Levemir 40 units QHS, Humalog 38-48 units TID with meals, Invokana 100 mg daily Current orders for Inpatient glycemic control: Lantus 22 units QHS, Novolog 4 units Q4H for tube feeding coverage, Novolog 0-15 units Q4H  Inpatient Diabetes Program Recommendations: Insulin - Basal: Over the past 24 hours glucose has ranged from 246-317 mg/dl and patient has received a total of Novlog 87 units (59 units for correction and 28 units for tube feeding coverage).   Please consider increasing Lantus to 35 units QHS (based on 87.9 kg x 0.4 units). Correction (SSI): Please consider increasing Novolog correction to Resistant scale. Insulin - Meal Coverage: Please consider increasing tube feeding coverage to Novolog 6 units Q4H.  Thanks, Barnie Alderman, RN, MSN, CDE Diabetes Coordinator Inpatient Diabetes Program [phone] (Team Pager from [place] to [place]) [phone] ([place] office) [phone] [facility] office) [phone] [facility] office)

## 2016-06-22 LAB — BASIC METABOLIC PANEL
Anion gap: 7 (ref 5–15)
BUN: 15 mg/dL (ref 6–20)
CHLORIDE: 97 mmol/L — AB (ref 101–111)
CO2: 32 mmol/L (ref 22–32)
Calcium: 9.2 mg/dL (ref 8.9–10.3)
Glucose, Bld: 308 mg/dL — ABNORMAL HIGH (ref 65–99)
POTASSIUM: 4.1 mmol/L (ref 3.5–5.1)
SODIUM: 136 mmol/L (ref 135–145)

## 2016-06-22 LAB — CBC
HCT: 23.9 % — ABNORMAL LOW (ref 35.0–47.0)
Hemoglobin: 8.2 g/dL — ABNORMAL LOW (ref 12.0–16.0)
MCH: 31.8 pg (ref 26.0–34.0)
MCHC: 34.1 g/dL (ref 32.0–36.0)
MCV: 93.2 fL (ref 80.0–100.0)
PLATELETS: 216 10*3/uL (ref 150–440)
RBC: 2.57 MIL/uL — ABNORMAL LOW (ref 3.80–5.20)
RDW: 16.2 % — AB (ref 11.5–14.5)
WBC: 3.7 10*3/uL (ref 3.6–11.0)

## 2016-06-22 LAB — GLUCOSE, CAPILLARY
Glucose-Capillary: 272 mg/dL — ABNORMAL HIGH (ref 65–99)
Glucose-Capillary: 295 mg/dL — ABNORMAL HIGH (ref 65–99)
Glucose-Capillary: 301 mg/dL — ABNORMAL HIGH (ref 65–99)
Glucose-Capillary: 305 mg/dL — ABNORMAL HIGH (ref 65–99)
Glucose-Capillary: 309 mg/dL — ABNORMAL HIGH (ref 65–99)
Glucose-Capillary: 319 mg/dL — ABNORMAL HIGH (ref 65–99)
Glucose-Capillary: 380 mg/dL — ABNORMAL HIGH (ref 65–99)

## 2016-06-22 MED ORDER — FUROSEMIDE 20 MG PO TABS: 20.0000 mg | ORAL_TABLET | Freq: Every day | ORAL | Status: DC

## 2016-06-22 MED ORDER — PREDNISONE 20 MG PO TABS
30.0000 mg | ORAL_TABLET | Freq: Every day | ORAL | Status: AC
  Administered 2016-06-24: 30 mg via ORAL
  Filled 2016-06-22: qty 1

## 2016-06-22 MED ORDER — FUROSEMIDE 20 MG PO TABS
20.0000 mg | ORAL_TABLET | Freq: Every day | ORAL | Status: DC
  Administered 2016-06-22 – 2016-06-26 (×5): 20 mg via ORAL
  Filled 2016-06-22 (×5): qty 1

## 2016-06-22 MED ORDER — CIPROFLOXACIN IN D5W 400 MG/200ML IV SOLN
400.0000 mg | Freq: Three times a day (TID) | INTRAVENOUS | Status: DC
  Administered 2016-06-22 – 2016-06-23 (×4): 400 mg via INTRAVENOUS
  Filled 2016-06-22 (×5): qty 200

## 2016-06-22 MED ORDER — PREDNISONE 20 MG PO TABS
40.0000 mg | ORAL_TABLET | Freq: Every day | ORAL | Status: AC
  Administered 2016-06-23: 40 mg via ORAL
  Filled 2016-06-22: qty 2

## 2016-06-22 MED ORDER — PREDNISONE 10 MG PO TABS
10.0000 mg | ORAL_TABLET | Freq: Every day | ORAL | Status: AC
  Administered 2016-06-26: 10 mg via ORAL
  Filled 2016-06-22: qty 1

## 2016-06-22 MED ORDER — POTASSIUM CHLORIDE 20 MEQ PO PACK
10.0000 meq | PACK | Freq: Every day | ORAL | Status: DC
  Administered 2016-06-22 – 2016-06-26 (×5): 10 meq via ORAL
  Filled 2016-06-22 (×5): qty 1

## 2016-06-22 MED ORDER — INSULIN GLARGINE 100 UNIT/ML ~~LOC~~ SOLN
40.0000 [IU] | Freq: Every day | SUBCUTANEOUS | Status: DC
  Administered 2016-06-22 – 2016-06-25 (×4): 40 [IU] via SUBCUTANEOUS
  Filled 2016-06-22 (×5): qty 0.4

## 2016-06-22 MED ORDER — METRONIDAZOLE 500 MG PO TABS
500.0000 mg | ORAL_TABLET | Freq: Three times a day (TID) | ORAL | Status: DC
  Administered 2016-06-22 – 2016-06-26 (×13): 500 mg via ORAL
  Filled 2016-06-22 (×13): qty 1

## 2016-06-22 MED ORDER — PREDNISONE 20 MG PO TABS
20.0000 mg | ORAL_TABLET | Freq: Every day | ORAL | Status: AC
  Administered 2016-06-25: 20 mg via ORAL
  Filled 2016-06-22: qty 1

## 2016-06-22 MED ORDER — INSULIN ASPART 100 UNIT/ML ~~LOC~~ SOLN
0.0000 [IU] | SUBCUTANEOUS | Status: DC
  Administered 2016-06-22: 11 [IU] via SUBCUTANEOUS
  Administered 2016-06-22: 15 [IU] via SUBCUTANEOUS
  Administered 2016-06-23: 4 [IU] via SUBCUTANEOUS
  Administered 2016-06-23: 20 [IU] via SUBCUTANEOUS
  Administered 2016-06-23 (×2): 11 [IU] via SUBCUTANEOUS
  Administered 2016-06-23 (×2): 7 [IU] via SUBCUTANEOUS
  Administered 2016-06-24: 3 [IU] via SUBCUTANEOUS
  Administered 2016-06-24: 4 [IU] via SUBCUTANEOUS
  Administered 2016-06-24: 3 [IU] via SUBCUTANEOUS
  Administered 2016-06-24: 11 [IU] via SUBCUTANEOUS
  Administered 2016-06-24 – 2016-06-25 (×4): 4 [IU] via SUBCUTANEOUS
  Administered 2016-06-25 (×2): 15 [IU] via SUBCUTANEOUS
  Administered 2016-06-25: 3 [IU] via SUBCUTANEOUS
  Administered 2016-06-25: 4 [IU] via SUBCUTANEOUS
  Administered 2016-06-26: 11 [IU] via SUBCUTANEOUS
  Administered 2016-06-26: 7 [IU] via SUBCUTANEOUS
  Administered 2016-06-26 (×2): 4 [IU] via SUBCUTANEOUS
  Filled 2016-06-22: qty 3
  Filled 2016-06-22: qty 20
  Filled 2016-06-22: qty 4
  Filled 2016-06-22: qty 11
  Filled 2016-06-22: qty 4
  Filled 2016-06-22: qty 15
  Filled 2016-06-22: qty 4
  Filled 2016-06-22: qty 11
  Filled 2016-06-22: qty 7
  Filled 2016-06-22: qty 3
  Filled 2016-06-22 (×3): qty 4
  Filled 2016-06-22 (×2): qty 11
  Filled 2016-06-22: qty 7
  Filled 2016-06-22: qty 3
  Filled 2016-06-22: qty 7
  Filled 2016-06-22 (×2): qty 4
  Filled 2016-06-22: qty 7
  Filled 2016-06-22 (×2): qty 15
  Filled 2016-06-22: qty 11

## 2016-06-22 NOTE — Progress Notes (Signed)
Inpatient Diabetes Program Recommendations  AACE/ADA: New Consensus Statement on Inpatient Glycemic Control (2015)  Target Ranges:  Prepandial:   less than 140 mg/dL      Peak postprandial:   less than 180 mg/dL (1-2 hours)      Critically ill patients:  140 - 180 mg/dL   Lab Results  Component Value Date   GLUCAP 301 (H) 06/22/2016   HGBA1C 10.5 (H) 06/05/2016    Review of Glycemic Control  Results for Ebony [name], Ebony [name] (MRN [id number]) as of 06/22/2016 10:50  Ref. Range 06/21/2016 16:56 06/21/2016 20:47 06/21/2016 23:41 06/22/2016 04:49 06/22/2016 07:55  Glucose-Capillary Latest Ref Range: 65 - 99 mg/dL 313 (H) 334 (H) 309 (H) 305 (H) 301 (H)    Diabetes history: DM2  Outpatient Diabetes medications: Glipizide 5mg  daily, Levemir 40units QHS, Humalog 38-48 units TID with meals, Invokana 100 mg daily  Current orders for Inpatient glycemic control: Lantus 35 units QHS, Novolog 4 units Q4H for tube feeding coverage, Novolog 0-15 units Q4H  Inpatient Diabetes Program Recommendations:   Please consider increasing Novolog correction to Resistant scale 0-20 units.   Please consider increasing tube feeding coverage to Novolog 6 units Q4H.  Please increase Lantus to 40 units qhs.   Gentry Fitz, RN, BA, MHA, CDE Diabetes Coordinator Inpatient Diabetes Program  [phone] (Team Pager) [phone] ([place]) 06/22/2016 10:53 AM

## 2016-06-22 NOTE — Progress Notes (Signed)
Pharmacy Antibiotic Note  Ebony [name] is a 70 y.o. female admitted on 06/07/2016 with ESBL UTI and LLL cavitary lesion.  Pharmacy has been consulted for cipro dosing.  7/27- Bronch cx: + Pseudomonas aeruginosa- pan sensitive  Plan: Changing ceftazidime to ciprofloxacin 400 IV q8h.  Planning LOT until at least 8/16 for now per ID.     Height: 5\' 2"  (157.5 cm) Weight: 193 lb 9.6 oz (87.8 kg) IBW/kg (Calculated) : 50.1  Temp (24hrs), Avg:98.2 F (36.8 C), Min:97.5 F (36.4 C), Max:98.8 F (37.1 C)   Recent Labs Lab 06/19/16 0544 06/21/16 0655 06/22/16 0500  WBC 3.7  --  3.7  CREATININE <0.30* <0.30* <0.30*    CrCl cannot be calculated (This lab value cannot be used to calculate CrCl because it is not a number: <0.30).    Allergies  Allergen Reactions  . Other Hives and Rash    Shellfish. Shellfish Pt reports no allergic reaction to iodine or betadine.  . Shellfish Allergy Hives and Rash    Vanc 07/26 >> 07/27 Pip-tazo 07/26 >> 07/29 Metronidazole 07/27 >> 07/28 Cipro 07/27 >> 07/28 Voriconazole 07/27 >> 08/01 Meropenem 07/29 >> 07/31 Ceftaz 07/31 >> 8/10 Cipro 8/10 >> Flagyl PO 8/10>>   Microbiology results: 7/26 BCx: NG 7/26 UCx: ESBL positive E.coli. And Klebsiella pneumoniae pan-resistant - per ID note 8/10 WBC on UA decreased and no UTI sxs now so would not tailor abx towards this.  7/27 BAL: Pseudomonas aeruginosa pan-sensitive 7/27 MRSA PCR: negative  Thank you for allowing pharmacy to be a part of this patient's care.  Rocky Morel, PharmD Clinical Pharmacist 06/22/2016 1:48 PM

## 2016-06-22 NOTE — Care Management (Signed)
Barrier to discharge: ESBL in urine, Pseudomonas PNA. ID consult. Flexiseal, PICC line and PEG with feedings. Plan is discharge to SNF when medically stable.

## 2016-06-22 NOTE — Progress Notes (Signed)
[facility] [place] NAME: Sarika Hargett    MR#:  [id number]  DATE OF BIRTH:  1945/11/19  SUBJECTIVE:  CHIEF COMPLAINT:   Chief Complaint  Patient presents with  . Constipation  . Other    decreased urine output  The patient is 70 year old Caucasian female with past medical history significant for history of breast cancer, COPD, depression, diabetes mellitus type 2, hypertension, hyperlipidemia, hypothyroidism, who presents to the hospital with complaints of urinary retention and constipation. She was found to be hypotensive and was admitted to the hospital for further evaluation. Due to abdominal discomfort. Patient had CT scan of abdomen and pelvis revealing acute diverticulitis, chest x-ray revealed a left lower lobe airspace disease, worrisome for pneumonia, BAL culture revealed Pseudomonas aeruginosa. In the hospital, she underwent bronchoscopic evaluation, cytology was negative for malignancy. Urine cultures revealed more than 100,000 colony-forming units of Escherichia coli and Klebsiella pneumonia resistant to multiple antibiotics, patient's antibiotics were changed from [place] to Cipro and Flagyl , it is recommended to continue antibiotics through August 16th. She feels comfortable today, denies any pain, continues to have diarrheal stool, and other pathogens and C. difficile was tested and it was negative. Dr. Ola Spurr recommended not to address urinary tract bacterial pathogens due to decrease in pyuria. Patient remained hyperglycemic, insulin Lantus is being advanced to 40 units subcutaneously . She remains on steroids, however, those are being tapered . The patient admits of dyspnea, especially whenever she lays down flat   Review of Systems  Unable to perform ROS: Language  Constitutional: Positive for malaise/fatigue.    VITAL SIGNS: Blood pressure (!) 157/74, pulse 87, temperature 98.8 F (37.1 C), temperature source  Axillary, resp. rate 18, height 5\' 2"  (1.575 m), weight 87.8 kg (193 lb 9.6 oz), SpO2 97 %.  PHYSICAL EXAMINATION:   GENERAL:  70 y.o.-year-old patient lying in the bed with no acute distress. Pale, chronically ill-looking. Better verbal interaction. Dry oral mucosa EYES: Pupils equal, round, reactive to light and accommodation. No scleral icterus. Extraocular muscles intact.  HEENT: Head atraumatic, normocephalic. Oropharynx and nasopharynx clear.  NECK:  Supple, no jugular venous distention. No thyroid enlargement, no tenderness.  LUNGS: Diminished breath sounds bilaterally, no wheezing, scattered rales,rhonchi and crepitations bilaterally anteriorly. Intermittent use of accessory muscles of respiration.  CARDIOVASCULAR: S1, S2 normal. No murmurs, rubs, or gallops.  ABDOMEN: Soft, nontender, nondistended. PEG tube is noted in upper abdomen . Bowel sounds present. No organomegaly or mass.  EXTREMITIES: 1 plus lower extremity and pedal edema, no cyanosis, or clubbing.  NEUROLOGIC: Cranial nerves II through XII are intact. Muscle strength 5/5 in all extremities. Sensation intact. Gait not checked.  PSYCHIATRIC: The patient is alert and oriented x 3. Has difficulty speaking SKIN: No obvious rash, lesion, or ulcer.   ORDERS/RESULTS REVIEWED:   CBC  Recent Labs Lab 06/19/16 0544 06/21/16 0655 06/22/16 0500  WBC 3.7  --  3.7  HGB 8.5* 7.9* 8.2*  HCT 25.2*  --  23.9*  PLT 160  --  216  MCV 92.0  --  93.2  MCH 31.0  --  31.8  MCHC 33.7  --  34.1  RDW 16.1*  --  16.2*   ------------------------------------------------------------------------------------------------------------------  Chemistries   Recent Labs Lab 06/19/16 0544 06/21/16 0655 06/22/16 0500  NA 139 134* 136  K 3.6 3.7 4.1  CL 99* 96* 97*  CO2 36* 32 32  GLUCOSE 108* 324* 308*  BUN 14 18 15  CREATININE <0.30* <0.30* <0.30*  CALCIUM 9.0 8.8* 9.2    ------------------------------------------------------------------------------------------------------------------ CrCl cannot be calculated (This lab value cannot be used to calculate CrCl because it is not a number: <0.30). ------------------------------------------------------------------------------------------------------------------ No results for input(s): TSH, T4TOTAL, T3FREE, THYROIDAB in the last 72 hours.  Invalid input(s): FREET3  Cardiac Enzymes No results for input(s): CKMB, TROPONINI, MYOGLOBIN in the last 168 hours.  Invalid input(s): CK ------------------------------------------------------------------------------------------------------------------ Invalid input(s): POCBNP ---------------------------------------------------------------------------------------------------------------  RADIOLOGY: No results found.  EKG:  Orders placed or performed during the hospital encounter of 06/07/16  . EKG 12-Lead  . EKG 12-Lead  . EKG 12-Lead  . EKG 12-Lead    ASSESSMENT AND PLAN:  Active Problems:   Hypotension #1. Sepsis due to left lower lobe cavitary pneumonia due to Pseudomonas,  blood cultures were negative, continue patient on Ciprofloxacin  per Dr. Blane Ohara recommendations, recommended to continue this antibiotics through August 16. Flagyl was added for colitis and possibly to aid with C. difficile prevention, however, Dr. Ola Spurr did not recommend to address ESBL Escherichia coli and carbapenem resistant Klebsiella UTI. Cipro/Flagyl course  Duration is going to be determined by radiographic and clinical response, follow-up with pulmonary  and ID as outpatient. PICC is placed and 1 port is not  Functioning.  May need to replace PICC line.  #2. Malnutrition, I myself, speech therapist as palliative care discussed with patient risks as well as benefits of oral intake, patient chose to eat, continue full liquid diet ,  unable to get modified barium swallow study at  this time, per speech therapist, continue tube feeds, dietary is consulted, patient was advised to be careful with oral intake, again today  #3. Hypotension, resolved, likely malnutrition and sepsis related #4. Acute on chronic respiratory failure with hypoxia and mild hypercapnia due to left lower lobe cavitary pneumonia due to Pseudomonas, continue oxygen therapy as needed, weaning off oxygen as tolerated, now on 2 L of oxygen through nasal cannula with good oxygenation, continue ciprofloxacin,  she will need prolonged antibiotic therapy, at least through 06/28/2016, per Dr. Ola Spurr,. Appreciate his  recommendations.  #5. Diarrhea, stool cultures, including PCR for C. difficile are negative, getting norovirus testing,initiated cholestyramine  twice a day, follow clinically #6 ESBL Escherichia coli and carbapenem resistant Klebsiella bacteriuria, not being covered with current antibiotic therapy,Dr. Ola Spurr recommended not to initiate therapy  Management plans discussed with the patient, family and they are in agreement.   DRUG ALLERGIES:  Allergies  Allergen Reactions  . Other Hives and Rash    Shellfish. Shellfish Pt reports no allergic reaction to iodine or betadine.  . Shellfish Allergy Hives and Rash    CODE STATUS:     Code Status Orders        Start     Ordered   06/16/16 0954  Limited resuscitation (code)  Continuous    Question Answer Comment  In the event of cardiac or respiratory ARREST: Initiate Code Blue, Call Rapid Response Yes   In the event of cardiac or respiratory ARREST: Perform CPR No   In the event of cardiac or respiratory ARREST: Perform Intubation/Mechanical Ventilation Yes   In the event of cardiac or respiratory ARREST: Use NIPPV/BiPAp only if indicated Yes   In the event of cardiac or respiratory ARREST: Administer ACLS medications if indicated No   In the event of cardiac or respiratory ARREST: Perform Defibrillation or Cardioversion if indicated No       06/16/16 0953    Code Status History  Date Active Date Inactive Code Status Order ID Comments User Context   06/08/2016  4:16 AM 06/16/2016  9:53 AM Full Code [id number]  Harrie Foreman, MD Inpatient   02/27/2016 11:30 AM 03/22/2016  6:51 PM Full Code [id number]  Dustin Flock, MD ED   10/07/2015 11:30 AM 10/12/2015  5:35 PM Full Code [id number]  Theodoro Grist, MD Inpatient      TOTAL TIME TAKING CARE OF THIS PATIENT: 30 minutes.    Theodoro Grist M.D on 06/22/2016 at 4:46 PM  Between 7am to 6pm - Pager - [phone]  After 6pm go to [email or web address] - password [email or web address] [facility]  Office  [phone]  CC: Primary care physician; Renee Rival, NP

## 2016-06-22 NOTE — Clinical Social Work Note (Signed)
MSW continuing to follow patient's progress patient would like to go to [facility] once she is medically ready for discharge and orders have been received.  Jones Broom. Kyrah Schiro, MSW [phone]  Mon-Fri 8a-4:30p 06/22/2016 11:56 AM

## 2016-06-22 NOTE — Consult Note (Signed)
[facility] Infectious Disease     Reason for Consult:CRE UTI   Referring Physician: Theodoro Grist Date of Admission:  06/07/2016   Active Problems:   Hypotension   HPI: Ebony [name] is a 70 y.o. female admitted 7/26 with diverticulitis. 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 On imaging also had a cavitary lesion at base of her lungs. On admit wbc was 7.8, and she was afebrile. She had ARF with cr up to 2.6 and UA showed TNTC WBC. UCX grew > 100 K E coli (ESBL) and CRE Klebsiella Pna.   She had a bronch 7/27 showing >100 K Pseudomonas (pan sensitive). She has been on ceftazidime from 7/31- present. Also received meropenem 7/29-7/31. She has a hx of Breast CA, severe COPD, morbid obesity  Past Medical History:  Diagnosis Date  . Asthma   . Breast cancer ([place])   . Carpal tunnel syndrome   . COPD (chronic obstructive pulmonary disease) ([place])   . Depression   . Diabetes mellitus type 2, uncomplicated ([place])   . Hyperlipidemia   . Hypertension   . Hypothyroidism   . Osteoporosis, post-menopausal   . Psoriasis   . Sleep apnea   . Vitamin D deficiency    Past Surgical History:  Procedure Laterality Date  . BREAST EXCISIONAL BIOPSY Left 10/06/2015   np for surgery lumpectomy  . BREAST LUMPECTOMY WITH NEEDLE LOCALIZATION Left 10/06/2015   Procedure: BREAST LUMPECTOMY WITH NEEDLE LOCALIZATION;  Surgeon: Hubbard Robinson, MD;  Location: [facility] ORS;  Service: General;  Laterality: Left;  . carpal tunnel Right 1995  . Right heel spur removed    . SENTINEL NODE BIOPSY Left 10/06/2015   Procedure: SENTINEL NODE BIOPSY;  Surgeon: Hubbard Robinson, MD;  Location: [facility] ORS;  Service: General;  Laterality: Left;  . THUMB ARTHROSCOPY Left 2000   release of a nerve to the thumb.  . TRACHEOSTOMY TUBE PLACEMENT N/A 03/14/2016   Procedure: TRACHEOSTOMY;  Surgeon: Carloyn Manner, MD;  Location: [facility] ORS;  Service: ENT;  Laterality: N/A;  . TUBAL LIGATION      Social History  Substance Use Topics  . Smoking status: Former Smoker    Packs/day: 0.50    Years: 30.00    Types: Cigarettes    Quit date: 10/05/2015  . Smokeless tobacco: Never Used  . Alcohol use No   Family History  Problem Relation Age of Onset  . Diabetes Mother   . Thyroid disease Mother   . COPD Father   . Emphysema Father   . Heart disease Brother   . Diabetes Brother   . Heart disease Brother   . Thyroid disease Daughter   . Thyroid disease Son     Allergies:  Allergies  Allergen Reactions  . Other Hives and Rash    Shellfish. Shellfish Pt reports no allergic reaction to iodine or betadine.  . Shellfish Allergy Hives and Rash    Current antibiotics: Antibiotics Given (last 72 hours)    Date/Time Action Medication Dose Rate   06/19/16 1518 Given   cefTAZidime (FORTAZ) 2 g in dextrose 5 % 50 mL IVPB 2 g 100 mL/hr   06/19/16 2318 Given   cefTAZidime (FORTAZ) 2 g in dextrose 5 % 50 mL IVPB 2 g 100 mL/hr   06/20/16 0503 Given   cefTAZidime (FORTAZ) 2 g in dextrose 5 % 50 mL IVPB 2 g 100 mL/hr   06/20/16 1352 Given   cefTAZidime (FORTAZ) 2  g in dextrose 5 % 50 mL IVPB 2 g 100 mL/hr   06/20/16 2223 Given   cefTAZidime (FORTAZ) 2 g in dextrose 5 % 50 mL IVPB 2 g 100 mL/hr   06/21/16 7681 Given   cefTAZidime (FORTAZ) 2 g in dextrose 5 % 50 mL IVPB 2 g 100 mL/hr   06/21/16 1353 Given   cefTAZidime (FORTAZ) 2 g in dextrose 5 % 50 mL IVPB 2 g 100 mL/hr   06/21/16 2104 Given   cefTAZidime (FORTAZ) 2 g in dextrose 5 % 50 mL IVPB 2 g 100 mL/hr   06/22/16 0501 Given   cefTAZidime (FORTAZ) 2 g in dextrose 5 % 50 mL IVPB 2 g 100 mL/hr      MEDICATIONS: . antiseptic oral rinse  7 mL Mouth Rinse BID  . budesonide (PULMICORT) nebulizer solution  0.25 mg Nebulization Q6H  . cefTAZidime (FORTAZ)  IV  2 g Intravenous Q8H  . cholestyramine light  4 g Oral BID  . diltiazem  30 mg Per Tube Q6H  . enoxaparin (LOVENOX) injection  40 mg Subcutaneous Q24H  . escitalopram   10 mg Oral Daily  . famotidine  20 mg Per Tube Daily  . feeding supplement (PRO-STAT SUGAR FREE 64)  30 mL Per Tube BID  . free water  200 mL Per Tube Q8H  . insulin aspart  0-15 Units Subcutaneous Q4H  . insulin aspart  4 Units Subcutaneous Q4H  . insulin glargine  35 Units Subcutaneous QHS  . ipratropium-albuterol  3 mL Nebulization Q6H  . levothyroxine  200 mcg Per Tube QAC breakfast  . levothyroxine  50 mcg Per Tube QAC breakfast  . methylPREDNISolone (SOLU-MEDROL) injection  20 mg Intravenous Q12H  . sodium chloride flush  10-40 mL Intracatheter Q12H  . sodium chloride flush  3 mL Intravenous Q12H    Review of Systems - 11 systems reviewed and negative per HPI   OBJECTIVE: Temp:  [97.5 F (36.4 C)-98.8 F (37.1 C)] 98.8 F (37.1 C) (08/10 1159) Pulse Rate:  [74-87] 87 (08/10 1159) Resp:  [16-18] 18 (08/10 1159) BP: (133-157)/(50-74) 157/74 (08/10 1159) SpO2:  [97 %-100 %] 97 % (08/10 1159) FiO2 (%):  [30 %] 30 % (08/10 0130) Weight:  [87.8 kg (193 lb 9.6 oz)] 87.8 kg (193 lb 9.6 oz) (08/10 0500) Physical Exam  Constitutional:  oriented to person, place, and time. Morbidly obese, chronically ill appearing, on 2L O2 Devices- Rectal tube, no foley, Picc RUE HENT: [place]/AT, PERRLA, no scleral icterus Trach site healed Mouth/Throat: Oropharynx is clear and dry. No oropharyngeal exudate.  Cardiovascular: Normal rate, regular rhythm and normal heart sounds. Pulmonary/Chest: bil rhonchi Neck = supple, no nuchal rigidity Abdominal: Soft. Bowel sounds are normal.  exhibits mild distension. There is no tenderness.  Lymphadenopathy: no cervical adenopathy. No axillary adenopathy Neurological: alert and oriented to person, place, and time  Skin: some ecchymosis, no rash  Psychiatric:flat affect LABS: Results for orders placed or performed during the hospital encounter of 06/07/16 (from the past 48 hour(s))  C difficile quick scan w PCR reflex     Status: None   Collection Time:  06/20/16  2:59 PM  Result Value Ref Range   C Diff antigen NEGATIVE NEGATIVE   C Diff toxin NEGATIVE NEGATIVE   C Diff interpretation No C. difficile detected.   Gastrointestinal Panel by PCR , Stool     Status: None   Collection Time: 06/20/16  2:59 PM  Result Value Ref Range  Campylobacter species NOT DETECTED NOT DETECTED   Plesimonas shigelloides NOT DETECTED NOT DETECTED   Salmonella species NOT DETECTED NOT DETECTED   Yersinia enterocolitica NOT DETECTED NOT DETECTED   Vibrio species NOT DETECTED NOT DETECTED   Vibrio cholerae NOT DETECTED NOT DETECTED   Enteroaggregative E coli (EAEC) NOT DETECTED NOT DETECTED   Enteropathogenic E coli (EPEC) NOT DETECTED NOT DETECTED   Enterotoxigenic E coli (ETEC) NOT DETECTED NOT DETECTED   Shiga like toxin producing E coli (STEC) NOT DETECTED NOT DETECTED   E. coli O157 NOT DETECTED NOT DETECTED   Shigella/Enteroinvasive E coli (EIEC) NOT DETECTED NOT DETECTED   Cryptosporidium NOT DETECTED NOT DETECTED   Cyclospora cayetanensis NOT DETECTED NOT DETECTED   Entamoeba histolytica NOT DETECTED NOT DETECTED   Giardia lamblia NOT DETECTED NOT DETECTED   Adenovirus F40/41 NOT DETECTED NOT DETECTED   Astrovirus NOT DETECTED NOT DETECTED   Norovirus GI/GII NOT DETECTED NOT DETECTED   Rotavirus A NOT DETECTED NOT DETECTED   Sapovirus (I, II, IV, and V) NOT DETECTED NOT DETECTED  Glucose, capillary     Status: Abnormal   Collection Time: 06/20/16  4:51 PM  Result Value Ref Range   Glucose-Capillary 265 (H) 65 - 99 mg/dL  Glucose, capillary     Status: Abnormal   Collection Time: 06/20/16  8:18 PM  Result Value Ref Range   Glucose-Capillary 300 (H) 65 - 99 mg/dL  Glucose, capillary     Status: Abnormal   Collection Time: 06/20/16 11:58 PM  Result Value Ref Range   Glucose-Capillary 246 (H) 65 - 99 mg/dL  Glucose, capillary     Status: Abnormal   Collection Time: 06/21/16  3:55 AM  Result Value Ref Range   Glucose-Capillary 309 (H) 65 - 99  mg/dL  Basic metabolic panel     Status: Abnormal   Collection Time: 06/21/16  6:55 AM  Result Value Ref Range   Sodium 134 (L) 135 - 145 mmol/L   Potassium 3.7 3.5 - 5.1 mmol/L   Chloride 96 (L) 101 - 111 mmol/L   CO2 32 22 - 32 mmol/L   Glucose, Bld 324 (H) 65 - 99 mg/dL   BUN 18 6 - 20 mg/dL   Creatinine, Ser <0.30 (L) 0.44 - 1.00 mg/dL   Calcium 8.8 (L) 8.9 - 10.3 mg/dL   GFR calc non Af Amer NOT CALCULATED >60 mL/min   GFR calc Af Amer NOT CALCULATED >60 mL/min    Comment: (NOTE) The eGFR has been calculated using the CKD EPI equation. This calculation has not been validated in all clinical situations. eGFR's persistently <60 mL/min signify possible Chronic Kidney Disease.    Anion gap 6 5 - 15  Hemoglobin     Status: Abnormal   Collection Time: 06/21/16  6:55 AM  Result Value Ref Range   Hemoglobin 7.9 (L) 12.0 - 16.0 g/dL  Glucose, capillary     Status: Abnormal   Collection Time: 06/21/16  7:53 AM  Result Value Ref Range   Glucose-Capillary 317 (H) 65 - 99 mg/dL  Glucose, capillary     Status: Abnormal   Collection Time: 06/21/16 11:55 AM  Result Value Ref Range   Glucose-Capillary 255 (H) 65 - 99 mg/dL  Glucose, capillary     Status: Abnormal   Collection Time: 06/21/16  4:56 PM  Result Value Ref Range   Glucose-Capillary 313 (H) 65 - 99 mg/dL  Glucose, capillary     Status: Abnormal   Collection Time: 06/21/16  8:47 PM  Result Value Ref Range   Glucose-Capillary 334 (H) 65 - 99 mg/dL  Glucose, capillary     Status: Abnormal   Collection Time: 06/21/16 11:41 PM  Result Value Ref Range   Glucose-Capillary 309 (H) 65 - 99 mg/dL  Glucose, capillary     Status: Abnormal   Collection Time: 06/22/16  4:49 AM  Result Value Ref Range   Glucose-Capillary 305 (H) 65 - 99 mg/dL  Basic metabolic panel     Status: Abnormal   Collection Time: 06/22/16  5:00 AM  Result Value Ref Range   Sodium 136 135 - 145 mmol/L   Potassium 4.1 3.5 - 5.1 mmol/L   Chloride 97 (L) 101  - 111 mmol/L   CO2 32 22 - 32 mmol/L   Glucose, Bld 308 (H) 65 - 99 mg/dL   BUN 15 6 - 20 mg/dL   Creatinine, Ser <0.30 (L) 0.44 - 1.00 mg/dL   Calcium 9.2 8.9 - 10.3 mg/dL   GFR calc non Af Amer NOT CALCULATED >60 mL/min   GFR calc Af Amer NOT CALCULATED >60 mL/min    Comment: (NOTE) The eGFR has been calculated using the CKD EPI equation. This calculation has not been validated in all clinical situations. eGFR's persistently <60 mL/min signify possible Chronic Kidney Disease.    Anion gap 7 5 - 15  CBC     Status: Abnormal   Collection Time: 06/22/16  5:00 AM  Result Value Ref Range   WBC 3.7 3.6 - 11.0 K/uL   RBC 2.57 (L) 3.80 - 5.20 MIL/uL   Hemoglobin 8.2 (L) 12.0 - 16.0 g/dL   HCT 23.9 (L) 35.0 - 47.0 %   MCV 93.2 80.0 - 100.0 fL   MCH 31.8 26.0 - 34.0 pg   MCHC 34.1 32.0 - 36.0 g/dL   RDW 16.2 (H) 11.5 - 14.5 %   Platelets 216 150 - 440 K/uL  Glucose, capillary     Status: Abnormal   Collection Time: 06/22/16  7:55 AM  Result Value Ref Range   Glucose-Capillary 301 (H) 65 - 99 mg/dL  Glucose, capillary     Status: Abnormal   Collection Time: 06/22/16 11:27 AM  Result Value Ref Range   Glucose-Capillary 380 (H) 65 - 99 mg/dL   No components found for: ESR, C REACTIVE PROTEIN MICRO: Recent Results (from the past 720 hour(s))  Culture, blood (Routine x 2)     Status: None   Collection Time: 06/07/16  9:09 PM  Result Value Ref Range Status   Specimen Description BLOOD RIGHT HAND  Final   Special Requests BOTTLES DRAWN AEROBIC AND ANAEROBIC 5ML  Final   Culture NO GROWTH 5 DAYS  Final   Report Status 06/13/2016 FINAL  Final  Culture, blood (Routine x 2)     Status: None   Collection Time: 06/07/16  9:10 PM  Result Value Ref Range Status   Specimen Description BLOOD RIGHT ARM  Final   Special Requests BOTTLES DRAWN AEROBIC AND ANAEROBIC 5ML  Final   Culture NO GROWTH 5 DAYS  Final   Report Status 06/13/2016 FINAL  Final  Urine culture     Status: Abnormal    Collection Time: 06/07/16  9:10 PM  Result Value Ref Range Status   Specimen Description URINE, RANDOM  Final   Special Requests NONE  Final   Culture (A)  Final    >=100,000 COLONIES/mL ESCHERICHIA COLI Confirmed Extended Spectrum Beta-Lactamase Producer (ESBL) >=100,000 COLONIES/mL KLEBSIELLA PNEUMONIAE  CARBAPENEMASE RESISTANT ENTEROBACTERIACAE NOTIFIED INFECTION PREVENTION RESULT CALLED TO, READ BACK BY AND VERIFIED WITH: RN S.MOORE [id number] @1600  MLM Performed at [facility]    Report Status 06/11/2016 FINAL  Final   Organism ID, Bacteria ESCHERICHIA COLI (A)  Final   Organism ID, Bacteria KLEBSIELLA PNEUMONIAE (A)  Final      Susceptibility   Escherichia coli - MIC*    AMPICILLIN >=32 RESISTANT Resistant     CEFAZOLIN >=64 RESISTANT Resistant     CEFTRIAXONE >=64 RESISTANT Resistant     CIPROFLOXACIN >=4 RESISTANT Resistant     GENTAMICIN <=1 SENSITIVE Sensitive     IMIPENEM <=0.25 SENSITIVE Sensitive     NITROFURANTOIN <=16 SENSITIVE Sensitive     TRIMETH/SULFA <=20 SENSITIVE Sensitive     AMPICILLIN/SULBACTAM 16 INTERMEDIATE Intermediate     PIP/TAZO 8 SENSITIVE Sensitive     Extended ESBL POSITIVE Resistant     * >=100,000 COLONIES/mL ESCHERICHIA COLI   Klebsiella pneumoniae - MIC*    AMPICILLIN >=32 RESISTANT Resistant     CEFAZOLIN >=64 RESISTANT Resistant     CEFTRIAXONE >=64 RESISTANT Resistant     CIPROFLOXACIN >=4 RESISTANT Resistant     GENTAMICIN >=16 RESISTANT Resistant     IMIPENEM 8 RESISTANT Resistant     NITROFURANTOIN 256 RESISTANT Resistant     TRIMETH/SULFA >=320 RESISTANT Resistant     AMPICILLIN/SULBACTAM >=32 RESISTANT Resistant     PIP/TAZO >=128 RESISTANT Resistant     Extended ESBL NEGATIVE Sensitive     * >=100,000 COLONIES/mL KLEBSIELLA PNEUMONIAE  MRSA PCR Screening     Status: None   Collection Time: 06/08/16  4:16 AM  Result Value Ref Range Status   MRSA by PCR NEGATIVE NEGATIVE Final    Comment:        The GeneXpert MRSA Assay  (FDA approved for NASAL specimens only), is one component of a comprehensive MRSA colonization surveillance program. It is not intended to diagnose MRSA infection nor to guide or monitor treatment for MRSA infections.   Culture, bal-quantitative     Status: Abnormal   Collection Time: 06/08/16  1:03 PM  Result Value Ref Range Status   Specimen Description BRONCHIAL WASHINGS  Final   Special Requests NONE  Final   Gram Stain   Final    FEW WBC PRESENT, PREDOMINANTLY PMN FEW GRAM NEGATIVE RODS RARE GRAM POSITIVE RODS RARE GRAM POSITIVE COCCI IN PAIRS Performed at [facility]    Culture >=100,000 COLONIES/mL PSEUDOMONAS AERUGINOSA (A)  Final   Report Status 06/10/2016 FINAL  Final   Organism ID, Bacteria PSEUDOMONAS AERUGINOSA (A)  Final      Susceptibility   Pseudomonas aeruginosa - MIC*    CEFTAZIDIME <=1 SENSITIVE Sensitive     CIPROFLOXACIN <=0.25 SENSITIVE Sensitive     GENTAMICIN <=1 SENSITIVE Sensitive     IMIPENEM 1 SENSITIVE Sensitive     PIP/TAZO <=4 SENSITIVE Sensitive     CEFEPIME <=1 SENSITIVE Sensitive     * >=100,000 COLONIES/mL PSEUDOMONAS AERUGINOSA  Acid Fast Smear (AFB)     Status: None   Collection Time: 06/08/16  1:03 PM  Result Value Ref Range Status   AFB Specimen Processing Concentration  Final   Acid Fast Smear Negative  Final    Comment: (NOTE) Performed At: [facility] [place], [place] [id number] Lindon Romp MD LX:[id number]    Source (AFB) BRONCHIAL WASHINGS  Final  Culture, fungus without smear ([facility]-Only)     Status: None (Preliminary result)  Collection Time: 06/08/16  1:03 PM  Result Value Ref Range Status   Specimen Description BRONCHIAL WASHINGS  Final   Special Requests NONE  Final   Culture   Final    NO FUNGUS ISOLATED AFTER 7 DAYS Performed at [facility]    Report Status PENDING  Incomplete  C difficile quick scan w PCR reflex     Status: None   Collection Time: 06/20/16  2:59 PM   Result Value Ref Range Status   C Diff antigen NEGATIVE NEGATIVE Final   C Diff toxin NEGATIVE NEGATIVE Final   C Diff interpretation No C. difficile detected.  Final  Gastrointestinal Panel by PCR , Stool     Status: None   Collection Time: 06/20/16  2:59 PM  Result Value Ref Range Status   Campylobacter species NOT DETECTED NOT DETECTED Final   Plesimonas shigelloides NOT DETECTED NOT DETECTED Final   Salmonella species NOT DETECTED NOT DETECTED Final   Yersinia enterocolitica NOT DETECTED NOT DETECTED Final   Vibrio species NOT DETECTED NOT DETECTED Final   Vibrio cholerae NOT DETECTED NOT DETECTED Final   Enteroaggregative E coli (EAEC) NOT DETECTED NOT DETECTED Final   Enteropathogenic E coli (EPEC) NOT DETECTED NOT DETECTED Final   Enterotoxigenic E coli (ETEC) NOT DETECTED NOT DETECTED Final   Shiga like toxin producing E coli (STEC) NOT DETECTED NOT DETECTED Final   E. coli O157 NOT DETECTED NOT DETECTED Final   Shigella/Enteroinvasive E coli (EIEC) NOT DETECTED NOT DETECTED Final   Cryptosporidium NOT DETECTED NOT DETECTED Final   Cyclospora cayetanensis NOT DETECTED NOT DETECTED Final   Entamoeba histolytica NOT DETECTED NOT DETECTED Final   Giardia lamblia NOT DETECTED NOT DETECTED Final   Adenovirus F40/41 NOT DETECTED NOT DETECTED Final   Astrovirus NOT DETECTED NOT DETECTED Final   Norovirus GI/GII NOT DETECTED NOT DETECTED Final   Rotavirus A NOT DETECTED NOT DETECTED Final   Sapovirus (I, II, IV, and V) NOT DETECTED NOT DETECTED Final    IMAGING: Ct Abdomen Pelvis Wo Contrast  Result Date: 06/08/2016 CLINICAL DATA:  Constipation and decreased urine output. EXAM: CT ABDOMEN AND PELVIS WITHOUT CONTRAST TECHNIQUE: Multidetector CT imaging of the abdomen and pelvis was performed following the standard protocol without IV contrast. COMPARISON:  02/15/2016; 06/02/2013 FINDINGS: The lack of intravenous contrast limits the ability to evaluate solid abdominal organs. Lower  chest: Limited visualization of the lower thorax demonstrates development of a consolidative wedge-shaped opacity within the imaged left lower lobe with suspected central cavitation measuring at least 5.5 x 5.1 cm (image 1, series 3). Additional less well-defined nodular consolidative opacities are seen within the more inferior left lower lobe (image 7, series 3 as well as within the contralateral right lower lobe (image 10, series 3). Normal heart size. Calcifications within the mitral valve annulus. No pericardial effusion. Hepatobiliary: Suspected nodularity hepatic contour. There is potential Fritz Pickerel attenuating debris within otherwise normal-appearing gallbladder. No definitive gallbladder wall thickening or pericholecystic fluid on this noncontrast examination. No ascites Pancreas: Normal noncontrast appearance of the pancreas Spleen: Normal noncontrast appearance of the spleen. In cell note is made of small splenules. Adrenals/Urinary Tract: Normal noncontrast appearance of the bilateral kidneys. No renal stones. No renal stones are seen along the expected course of either ureter or the urinary bladder. The urinary bladder is decompressed with a Foley catheter. No urinary obstruction or perinephric stranding. There is diffuse thickening of the left adrenal gland without discrete nodule. Normal noncontrast appearance of the right  adrenal gland. Stomach/Bowel: Disc retention gastrostomy tube appears appropriately positioned. Scattered colonic diverticulosis with ill-defined stranding about the descending colon within the left lower abdominal quadrant (representative axial images 62 through 69, series 2; coronal image 43, series 5), findings suggestive of acute uncomplicated diverticulitis. No evidence of perforation or definable/drainable fluid collection on this noncontrast examination. Moderate colonic stool burden without evidence of enteric obstruction. Normal noncontrast appearance of the terminal ileum and  appendix. No pneumoperitoneum, pneumatosis or portal venous gas. Vascular/Lymphatic: Scattered atherosclerotic plaque within a normal caliber abdominal aorta. No bulky retroperitoneal, mesenteric, pelvic or inguinal lymphadenopathy. Reproductive: Degenerating partially calcified fibroid within the anterior aspect of the uterine fundus measures approximately 1.5 cm in diameter. No discrete adnexal lesion on this noncontrast examination. No free fluid in the pelvic cul-de-sac. Other: Regional soft tissues appear normal. Musculoskeletal: No acute or aggressive osseous abnormalities. A bone island is incidentally noted within the right femoral head. IMPRESSION: 1. Findings worrisome for acute uncomplicated diverticulitis involving the descending colon within the left lower abdominal quadrant. No evidence of perforation or definable/drainable fluid collection on this noncontrast examination. 2. Interval development of rather extensive consolidative wedge-shaped opacities within the left lower lobe with potential central cavitation worrisome for infection and/or aspiration. 3. Appropriately positioned gastrostomy tube. Moderate colonic stool burden without evidence of enteric obstruction. 4. Mild nodularity of the hepatic contour could be indicative of early cirrhotic change. Correlation with LFTs is recommended. 5. Suspected layering gallstones/gallbladder sludge within an otherwise normal-appearing gallbladder. Clinical correlation is advised. Further evaluation with abdominal ultrasound could be performed as indicated. 6.  Aortic Atherosclerosis (ICD10-170.0) Electronically Signed   By: Sandi Mariscal M.D.   On: 06/08/2016 00:25  Dg Chest 1 View  Result Date: 06/12/2016 CLINICAL DATA:  70 year old female with dyspnea. Diabetes. Breast cancer. Sleep apnea. Subsequent encounter. EXAM: CHEST 1 VIEW COMPARISON:  06/11/2016 chest x-ray.  03/08/2016 chest CT. FINDINGS: Portable exam with rotation to the right. Left central  line tip unchanged appearing to project over the level of the proximal superior vena cava. Endotracheal tube tip 3.6 cm above the carina. No gross pneumothorax. Persistent consolidation left lung base possibly with cavitation. The CT detected multi lobar airspace disease is not appreciated on the present plain film exam. If further delineation of lung parenchyma is clinically desired chest CT may be considered. Follow-up until clearance by plain film examination will be necessary to exclude underlying mass. Mild prominence mediastinal and cardiac silhouette is stable. No acute osseous abnormality. IMPRESSION: Persistent consolidation left lung base possibly with cavitation. The 03/08/2016 CT detected multi lobar airspace disease is not appreciated on the present plain film exam. Electronically Signed   By: Genia Del M.D.   On: 06/12/2016 07:51  Dg Chest 1 View  Result Date: 06/11/2016 CLINICAL DATA:  Dyspnea.  Breast cancer. EXAM: CHEST 1 VIEW COMPARISON:  06/10/2016. FINDINGS: Support tubes and apparatus are stable. Rounded opacity at the LEFT base is unchanged. No pneumothorax. Stable heart size. IMPRESSION: Persistent LEFT base infiltrate.  No improvement since priors. Electronically Signed   By: Staci Righter M.D.   On: 06/11/2016 07:19  Dg Chest 1 View  Result Date: 06/10/2016 CLINICAL DATA:  Shortness of breath EXAM: CHEST 1 VIEW COMPARISON:  June 08, 2016 FINDINGS: The ETT is in good position as is the left central line. Stable focal opacity in the left lung base. No other interval changes or acute abnormalities. IMPRESSION: Stable support apparatus. Stable rounded left basilar focal infiltrate. Recommend follow-up to resolution. Electronically Signed  By: Dorise Bullion III M.D   On: 06/10/2016 07:16  Dg Chest 1 View  Result Date: 06/08/2016 CLINICAL DATA:  Central catheter placement EXAM: CHEST 1 VIEW COMPARISON:  Study obtained earlier in the day FINDINGS: Central catheter tip is in the  superior vena cava. Endotracheal tube tip is 2.7 cm above the carina. No pneumothorax. There is persistent left lower lobe airspace consolidation. Lungs elsewhere clear. Heart size and pulmonary vascularity are normal. No adenopathy. IMPRESSION: Tube and catheter positions as described without pneumothorax. Persistent airspace consolidation left lower lobe. No new opacity. No change in cardiac silhouette. Electronically Signed   By: Lowella Grip III M.D.   On: 06/08/2016 15:16  Dg Chest 1 View  Result Date: 06/08/2016 CLINICAL DATA:  Dyspnea. EXAM: CHEST 1 VIEW COMPARISON:  06/07/2016 and 03/08/2016 as well as CT 03/08/2016 and 06/08/2016 FINDINGS: Patient slightly rotated to the right. Lungs are well inflated demonstrate persistent consolidation over the left lower lobe. Possible early cavitation of this peripheral consolidation on recent CT. Possible slight interval worsening opacification along the inferior aspect of this airspace process. This process is new since 03/08/2016 suggesting an infectious etiology. Prominent infarct could have a similar appearance with aseptic liquefaction. No effusion or pneumothorax. Mild stable cardiomegaly. Remainder of the exam is unchanged. IMPRESSION: Persistent consolidation over the left lower lobe with possible worsening along the inferior aspect of this airspace process. Possible early cavitation on recent CT. Findings most concerning for an infectious process and less likely infarct. Electronically Signed   By: Marin Olp M.D.   On: 06/08/2016 10:28  Dg Chest 1 View  Result Date: 06/07/2016 CLINICAL DATA:  Constipation decreased urine output, possible sepsis EXAM: CHEST 1 VIEW COMPARISON:  03/22/2016 FINDINGS: Cardiac shadow is mildly enlarged but stable. Patient rotation to the right is noted. The lungs are well aerated. Patchy infiltrate is noted in the left lung base with a somewhat rounded appearance consistent with acute pneumonia. This is new from the  prior exam. No other focal abnormality is noted. IMPRESSION: Left basilar infiltrate. Electronically Signed   By: Inez Catalina M.D.   On: 06/07/2016 21:56  US Venous Img Upper Uni Left  Result Date: 06/15/2016 CLINICAL DATA:  Left upper extremity edema acutely for 4 days. History of central line and diabetes. EXAM: LEFT UPPER EXTREMITY VENOUS DOPPLER ULTRASOUND TECHNIQUE: Gray-scale sonography with graded compression, as well as color Doppler and duplex ultrasound were performed to evaluate the upper extremity deep venous system from the level of the subclavian vein and including the jugular, axillary, basilic, radial, ulnar and upper cephalic vein. Spectral Doppler was utilized to evaluate flow at rest and with distal augmentation maneuvers. COMPARISON:  None. FINDINGS: Contralateral Subclavian Vein: Respiratory phasicity is normal and symmetric with the symptomatic side. No evidence of thrombus. Normal compressibility. Internal Jugular Vein: No evidence of thrombus. Normal compressibility, respiratory phasicity and response to augmentation. Subclavian Vein: No evidence of thrombus. Normal compressibility, respiratory phasicity and response to augmentation. Axillary Vein: No evidence of thrombus. Normal compressibility, respiratory phasicity and response to augmentation. Cephalic Vein: No evidence of thrombus. Normal compressibility, respiratory phasicity and response to augmentation. Basilic Vein: No evidence of thrombus. Normal compressibility, respiratory phasicity and response to augmentation. Brachial Veins: No evidence of thrombus. Normal compressibility, respiratory phasicity and response to augmentation. Radial Veins: No evidence of thrombus. Normal compressibility, respiratory phasicity and response to augmentation. Ulnar Veins: No evidence of thrombus. Normal compressibility, respiratory phasicity and response to augmentation. Venous Reflux:  None visualized.  Other Findings:  None visualized.  IMPRESSION: No evidence of deep venous thrombosis. Electronically Signed   By: Jerilynn Mages.  Shick M.D.   On: 06/15/2016 16:05   Dg Chest Port 1 View  Result Date: 06/19/2016 CLINICAL DATA:  Respiratory failure. EXAM: PORTABLE CHEST 1 VIEW COMPARISON:  06/15/2016. FINDINGS: Interim removal of left subclavian line. Right PICC line in stable position. Heart size normal. Mild improvement of left base infiltrate. Small left pleural effusion IMPRESSION: 1. Interim removal of left subclavian line. Right PICC line in stable position. 2. Left base atelectasis and/or infiltrate with slight interim improvement. Electronically Signed   By: Marcello Moores  Register   On: 06/19/2016 08:03   Dg Chest Port 1 View  Result Date: 06/15/2016 CLINICAL DATA:  Encounter for central line placement.  PICC line. EXAM: PORTABLE CHEST 1 VIEW COMPARISON:  One-view chest x-ray 06/13/2016 FINDINGS: The heart is mildly enlarged. Mild pulmonary vascular congestion is stable. Atherosclerotic changes are noted at the aortic arch. Left basilar airspace disease has slightly increased. A left subclavian line is stable. A new right-sided PICC line is in place. The tip is at the cavoatrial junction. There is no pneumothorax. The visualized soft tissues and bony thorax are unremarkable. IMPRESSION: 1. Interval placement of right-sided PICC line without radiographic evidence for complication. 2. Stable left subclavian line. 3. Stable cardiomegaly and mild pulmonary vascular congestion. 4. Progressive left lower lobe airspace disease worrisome for pneumonia or aspiration. 5. Atherosclerosis of the thoracic aorta. Electronically Signed   By: San Morelle M.D.   On: 06/15/2016 12:40   Dg Chest Port 1 View  Result Date: 06/13/2016 CLINICAL DATA:  Respiratory failure EXAM: PORTABLE CHEST 1 VIEW COMPARISON:  June 12, 2016 FINDINGS: Endotracheal tube is been removed. Central catheter tip is in the superior vena cava just beyond the junction with the left  innominate vein. No pneumothorax. There has been partial clearing of opacity from the left base. The area of questionable cavitation is less apparent compared to 1 day prior. Lungs elsewhere are clear. Heart is upper normal in size with pulmonary vascularity within normal limits. No adenopathy. No bone lesions. IMPRESSION: Central catheter as described. No pneumothorax. Partial but incomplete clearing of infiltrate from the left lower lobe. The area of questionable cavitation in the left lower lobe is less apparent compared to 1 day prior but still suggestive. No new opacity. No change in cardiac silhouette. Electronically Signed   By: Lowella Grip III M.D.   On: 06/13/2016 07:29   Dg Chest Port 1 View  Result Date: 06/08/2016 CLINICAL DATA:  Endotracheal tube placement EXAM: PORTABLE CHEST 1 VIEW COMPARISON:  06/08/2016 FINDINGS: Endotracheal tube with the tip 2.7 cm above the carina. There is left lower lobe focal consolidation. There is no pleural effusion or pneumothorax. The heart and mediastinal contours are unremarkable. The osseous structures are unremarkable. IMPRESSION: Endotracheal tube with the tip 2.7 cm above the carina. Left lower lobe pneumonia. Electronically Signed   By: Kathreen Devoid   On: 06/08/2016 13:15   Assessment:   Ebony [name] is a 70 y.o. 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 She also has LLL cavitary PNA with recent cx growing a quinolone sensitive Pseudomonas but AFB and fungal cx negative. Is currently s/p  ceftazidime from 7/31- present. Also received meropenem 7/29-7/31. Interestingly the ceftazidime will not treat the ESBL E coli nor the KPC in her urine but  fu UA done 8/1 shows only 6-30 WBC (admit UA with TNTC wbc) and she does not have a foley cath in place.  She does have a rectal tube and is having diarrhea but C diff negative.   Recommendations Pseudomonal PNA in  a patient with underlying  COPD  - noted to have thick secretions on bronch - would rec a 21 day total abx course for this - the cipro, zosyn meropenem and ceftaz she received would be effective but can narrow to ciprofloxacin to which the PA was sensitive (start date 7/26) - Will need this through August 16th for now - can extend if still with significant Xray findings and purulent sputum   UTI - with KPC and ESBL E coli She has grown very resistant organisms which should should not have responded to the ceftazidime. Therefore since her WBC on UA has decreased and she has no UTI sxs now I think this is not significant and would not tailor abx towards this.   Diverticulitis - Would add flagyl to the cipro to treat this. May also prevent C diff.  Thank you very much for allowing me to participate in the care of this patient. Please call with questions.   Cheral Marker. Ola Spurr, MD

## 2016-06-22 NOTE — Progress Notes (Signed)
Spoke with MD regarding PICC line with occluded port.  Pt previously seen and assessed by [facility] on Tuesday regarding occluded ports.  Purple port remains unable to flush at this time.  Order given to have PICC line replaced today.

## 2016-06-22 NOTE — Progress Notes (Signed)
Physical Therapy Treatment Patient Details Name: Ebony [name] MRN: [id number] DOB: Aug 16, 1946 Today's Date: 06/22/2016    History of Present Illness presented to ER secondary to urinary retention, constipation; admitted with sepsis related to L LL PNA (pseudomonas) and acute diverticulitis.  Hospital course significant for intubation 7/27-7/31 (now on supplemental O2 via ), bronchoscopy 7/27.  Of note, patient with previous hospitalization with prolonged intubation, resulting in trach (now removed and healed) and PEG with discharge to [facility]; returned home mid-July and has been bed-bound since, as patient unable to safety assist with Kelly activities.    PT Comments    Pt in bed and agrees to session.  Reported feeling better today.  Participated in BUE/LE exercises in supine and was able to tolerate increased exercises today.  Limited to bed exercises with rectal tube.  Follow Up Recommendations  SNF     Equipment Recommendations       Recommendations for Other Services       Precautions / Restrictions Precautions Precautions: Fall Precaution Comments: Contact isolation, PEG, sacral ulcer, rectal tube Restrictions Weight Bearing Restrictions: No    Mobility  Bed Mobility                  Transfers                    Ambulation/Gait                 Stairs            Wheelchair Mobility    Modified Rankin (Stroke Patients Only)       Balance                                    Cognition Arousal/Alertness: Awake/alert Behavior During Therapy: WFL for tasks assessed/performed Overall Cognitive Status: Within Functional Limits for tasks assessed                      Exercises Other Exercises Other Exercises: BLE AAROM for ankle pumps, heel slides, AB/Adduction, SLR x 20 reps Other Exercises: BUE AROM for Hand, writs, elbow, shoulder movements    General Comments        Pertinent Vitals/Pain Pain  Assessment: 0-10 Pain Score: 6  Pain Location: all over Pain Descriptors / Indicators: Aching Pain Intervention(s): Limited activity within patient's tolerance    Home Living                      Prior Function            PT Goals (current goals can now be found in the care plan section) Progress towards PT goals: Progressing toward goals    Frequency  Min 2X/week    PT Plan Current plan remains appropriate    Co-evaluation             End of Session   Activity Tolerance: Patient limited by fatigue Patient left: in bed;with call bell/phone within reach;with bed alarm set     Time: [id number] PT Time Calculation (min) (ACUTE ONLY): 10 min  Charges:  $Therapeutic Exercise: 8-22 mins                    G Codes:      Chesley Noon, PTA 06/22/16, 10:40 AM

## 2016-06-22 NOTE — Progress Notes (Signed)
Central  picc line team was able flushed both port of picc line successfully , picc line remains in place and working properly.

## 2016-06-23 DIAGNOSIS — J441 Chronic obstructive pulmonary disease with (acute) exacerbation: Secondary | ICD-10-CM

## 2016-06-23 DIAGNOSIS — J9611 Chronic respiratory failure with hypoxia: Secondary | ICD-10-CM

## 2016-06-23 LAB — BASIC METABOLIC PANEL
Anion gap: 7 (ref 5–15)
BUN: 17 mg/dL (ref 6–20)
CALCIUM: 9.2 mg/dL (ref 8.9–10.3)
CO2: 31 mmol/L (ref 22–32)
Chloride: 97 mmol/L — ABNORMAL LOW (ref 101–111)
Glucose, Bld: 237 mg/dL — ABNORMAL HIGH (ref 65–99)
Potassium: 3.8 mmol/L (ref 3.5–5.1)
SODIUM: 135 mmol/L (ref 135–145)

## 2016-06-23 LAB — GLUCOSE, CAPILLARY
Glucose-Capillary: 217 mg/dL — ABNORMAL HIGH (ref 65–99)
Glucose-Capillary: 222 mg/dL — ABNORMAL HIGH (ref 65–99)
Glucose-Capillary: 259 mg/dL — ABNORMAL HIGH (ref 65–99)
Glucose-Capillary: 354 mg/dL — ABNORMAL HIGH (ref 65–99)
Glucose-Capillary: 180 mg/dL — ABNORMAL HIGH (ref 65–99)
Glucose-Capillary: 160 mg/dL — ABNORMAL HIGH (ref 65–99)

## 2016-06-23 LAB — NOROVIRUS GROUP 1 & 2 BY PCR, STOOL
Norovirus 1 by PCR: NEGATIVE
Norovirus 2  by PCR: NEGATIVE

## 2016-06-23 MED ORDER — CIPROFLOXACIN HCL 750 MG PO TABS
750.0000 mg | ORAL_TABLET | Freq: Two times a day (BID) | ORAL | Status: DC
Start: 2016-06-23 — End: 2016-06-26
  Administered 2016-06-23 – 2016-06-26 (×6): 750 mg via ORAL
  Filled 2016-06-23 (×8): qty 1

## 2016-06-23 MED ORDER — CHOLESTYRAMINE LIGHT 4 G PO PACK
4.0000 g | PACK | Freq: Three times a day (TID) | ORAL | Status: DC
  Administered 2016-06-23 – 2016-06-26 (×9): 4 g via ORAL
  Filled 2016-06-23 (×9): qty 1

## 2016-06-23 NOTE — Progress Notes (Signed)
Pharmacy Antibiotic Note  Ebony [name] is a 70 y.o. female admitted on 06/07/2016 with ESBL UTI and LLL cavitary lesion.  Pharmacy has been consulted for cipro dosing.  7/27- Bronch cx: + Pseudomonas aeruginosa- pan sensitive  Plan: Continue ciprofloxacin 400 IV q8h.  Planning LOT until at least 8/16 for now per ID.     Height: 5\' 2"  (157.5 cm) Weight: 194 lb 1.6 oz (88 kg) IBW/kg (Calculated) : 50.1  Temp (24hrs), Avg:97.9 F (36.6 C), Min:97.7 F (36.5 C), Max:98.2 F (36.8 C)   Recent Labs Lab 06/19/16 0544 06/21/16 0655 06/22/16 0500 06/23/16 0536  WBC 3.7  --  3.7  --   CREATININE <0.30* <0.30* <0.30* <0.30*    CrCl cannot be calculated (This lab value cannot be used to calculate CrCl because it is not a number: <0.30).    Allergies  Allergen Reactions  . Other Hives and Rash    Shellfish. Shellfish Pt reports no allergic reaction to iodine or betadine.  . Shellfish Allergy Hives and Rash    Vanc 07/26 >> 07/27 Pip-tazo 07/26 >> 07/29 Metronidazole 07/27 >> 07/28 Cipro 07/27 >> 07/28 Voriconazole 07/27 >> 08/01 Meropenem 07/29 >> 07/31 Ceftaz 07/31 >> 8/10 Cipro 8/10 >> Flagyl PO 8/10>>   Microbiology results: 7/26 BCx: NG 7/26 UCx: ESBL positive E.coli. And Klebsiella pneumoniae pan-resistant - per ID note 8/10 WBC on UA decreased and no UTI sxs now so would not tailor abx towards this.  7/27 BAL: Pseudomonas aeruginosa pan-sensitive 7/27 MRSA PCR: negative  Thank you for allowing pharmacy to be a part of this patient's care.  Cheri Guppy, PharmD Clinical Pharmacist 06/23/2016 1:11 PM

## 2016-06-23 NOTE — Care Management Important Message (Signed)
Important Message  Patient Details  Name: RAGAN BLOOMQUIST MRN: [id number] Date of Birth: Mar 21, 1946   Medicare Important Message Given:  Yes    Jolly Mango, RN 06/23/2016, 8:58 AM

## 2016-06-23 NOTE — Progress Notes (Signed)
Inpatient Diabetes Program Recommendations  AACE/ADA: New Consensus Statement on Inpatient Glycemic Control (2015)  Target Ranges:  Prepandial:   less than 140 mg/dL      Peak postprandial:   less than 180 mg/dL (1-2 hours)      Critically ill patients:  140 - 180 mg/dL   Lab Results  Component Value Date   GLUCAP 222 (H) 06/23/2016   HGBA1C 10.5 (H) 06/05/2016    Review of Glycemic Control  Results for Ebony [name], Ebony [name] (MRN [id number]) as of 06/23/2016 09:29  Ref. Range 06/22/2016 16:36 06/22/2016 20:23 06/22/2016 23:53 06/23/2016 04:37 06/23/2016 07:27  Glucose-Capillary Latest Ref Range: 65 - 99 mg/dL 319 (H) 272 (H) 295 (H) 217 (H) 222 (H)    Diabetes history:DM2  Outpatient Diabetes medications: Glipizide 5mg  daily, Levemir 40units QHS, Humalog 38-48 units TID with meals, Invokana 100 mg daily  Current orders for Inpatient glycemic control: Lantus 40 units QHS, Novolog 4 units Q4H for tube feeding coverage, Novolog 0-20 units Q4H  Inpatient Diabetes Program Recommendations:   Please consider increasing tube feeding coverage to Novolog 6 units Q4H.  Gentry Fitz, RN, BA, MHA, CDE Diabetes Coordinator Inpatient Diabetes Program  [phone] (Team Pager) [phone] ([place]) 06/23/2016 9:30 AM

## 2016-06-23 NOTE — Progress Notes (Signed)
Rectal tube removed.   PICC line occluded.  [facility] reconsulted and should be here around 1130-12.

## 2016-06-23 NOTE — Progress Notes (Signed)
Ebony [name] INFECTIOUS DISEASE PROGRESS NOTE Date of Admission:  06/07/2016     ID: Ebony [name] is a 70 y.o. female with PNA Active Problems:   Hypotension   Subjective: No fevers, less cough   ROS  Eleven systems are reviewed and negative except per hpi  Medications:  Antibiotics Given (last 72 hours)    Date/Time Action Medication Dose Rate   06/20/16 2223 Given   cefTAZidime (FORTAZ) 2 g in dextrose 5 % 50 mL IVPB 2 g 100 mL/hr   06/21/16 0611 Given   cefTAZidime (FORTAZ) 2 g in dextrose 5 % 50 mL IVPB 2 g 100 mL/hr   06/21/16 1353 Given   cefTAZidime (FORTAZ) 2 g in dextrose 5 % 50 mL IVPB 2 g 100 mL/hr   06/21/16 2104 Given   cefTAZidime (FORTAZ) 2 g in dextrose 5 % 50 mL IVPB 2 g 100 mL/hr   06/22/16 0501 Given   cefTAZidime (FORTAZ) 2 g in dextrose 5 % 50 mL IVPB 2 g 100 mL/hr   06/22/16 1407 Given   ciprofloxacin (CIPRO) IVPB 400 mg 400 mg 200 mL/hr   06/22/16 1516 Given   metroNIDAZOLE (FLAGYL) tablet 500 mg 500 mg    06/22/16 2109 Given   metroNIDAZOLE (FLAGYL) tablet 500 mg 500 mg    06/22/16 2109 Given   ciprofloxacin (CIPRO) IVPB 400 mg 400 mg 200 mL/hr   06/23/16 0451 Given   ciprofloxacin (CIPRO) IVPB 400 mg 400 mg 200 mL/hr   06/23/16 0452 Given   metroNIDAZOLE (FLAGYL) tablet 500 mg 500 mg    06/23/16 1430 Given   metroNIDAZOLE (FLAGYL) tablet 500 mg 500 mg    06/23/16 1430 Given   ciprofloxacin (CIPRO) IVPB 400 mg 400 mg 200 mL/hr     . antiseptic oral rinse  7 mL Mouth Rinse BID  . budesonide (PULMICORT) nebulizer solution  0.25 mg Nebulization Q6H  . cholestyramine light  4 g Oral TID  . ciprofloxacin  400 mg Intravenous Q8H  . diltiazem  30 mg Per Tube Q6H  . enoxaparin (LOVENOX) injection  40 mg Subcutaneous Q24H  . escitalopram  10 mg Oral Daily  . famotidine  20 mg Per Tube Daily  . feeding supplement (PRO-STAT SUGAR FREE 64)  30 mL Per Tube BID  . free water  200 mL Per Tube Q8H  . furosemide  20 mg Oral QAC breakfast  . insulin  aspart  0-20 Units Subcutaneous Q4H  . insulin aspart  4 Units Subcutaneous Q4H  . insulin glargine  40 Units Subcutaneous QHS  . ipratropium-albuterol  3 mL Nebulization Q6H  . levothyroxine  200 mcg Per Tube QAC breakfast  . levothyroxine  50 mcg Per Tube QAC breakfast  . metroNIDAZOLE  500 mg Oral Q8H  . potassium chloride  10 mEq Oral QAC breakfast  . [START ON 06/24/2016] predniSONE  30 mg Oral Q breakfast   Followed by  . [START ON 06/25/2016] predniSONE  20 mg Oral Q breakfast   Followed by  . [START ON 06/26/2016] predniSONE  10 mg Oral Q breakfast  . sodium chloride flush  10-40 mL Intracatheter Q12H  . sodium chloride flush  3 mL Intravenous Q12H    Objective: Vital signs in last 24 hours: Temp:  [97.7 F (36.5 C)-98.2 F (36.8 C)] 98.2 F (36.8 C) (08/11 1116) Pulse Rate:  [76-94] 94 (08/11 1116) Resp:  [18-20] 18 (08/11 1116) BP: (117-128)/(43-64) 119/43 (08/11 1116) SpO2:  [91 %-99 %] 91 % (  08/11 1116) FiO2 (%):  [30 %] 30 % (08/11 0220) Weight:  [87.8 kg (193 lb 8 oz)-88 kg (194 lb 1.6 oz)] 88 kg (194 lb 1.6 oz) (08/11 1100) Constitutional:  oriented to person, place, and time. Morbidly obese, chronically ill appearing, on 2L O2 Devices- Rectal tube, no foley, Picc RUE HENT: [place]/AT, PERRLA, no scleral icterus Trach site healed Mouth/Throat: Oropharynx is clear and dry. No oropharyngeal exudate.  Cardiovascular: Normal rate, regular rhythm and normal heart sounds. Pulmonary/Chest: bil rhonchi Neck = supple, no nuchal rigidity Abdominal: Soft. Bowel sounds are normal.  exhibits mild distension. There is no tenderness.  Lymphadenopathy: no cervical adenopathy. No axillary adenopathy Neurological: alert and oriented to person, place, and time  Skin: some ecchymosis, no rash  Psychiatric:flat affect  Lab Results  Recent Labs  06/21/16 0655 06/22/16 0500 06/23/16 0536  WBC  --  3.7  --   HGB 7.9* 8.2*  --   HCT  --  23.9*  --   NA 134* 136 135  K 3.7 4.1 3.8   CL 96* 97* 97*  CO2 32 32 31  BUN 18 15 17   CREATININE <0.30* <0.30* <0.30*    Microbiology: Results for orders placed or performed during the hospital encounter of 06/07/16  Culture, blood (Routine x 2)     Status: None   Collection Time: 06/07/16  9:09 PM  Result Value Ref Range Status   Specimen Description BLOOD RIGHT HAND  Final   Special Requests BOTTLES DRAWN AEROBIC AND ANAEROBIC 5ML  Final   Culture NO GROWTH 5 DAYS  Final   Report Status 06/13/2016 FINAL  Final  Culture, blood (Routine x 2)     Status: None   Collection Time: 06/07/16  9:10 PM  Result Value Ref Range Status   Specimen Description BLOOD RIGHT ARM  Final   Special Requests BOTTLES DRAWN AEROBIC AND ANAEROBIC 5ML  Final   Culture NO GROWTH 5 DAYS  Final   Report Status 06/13/2016 FINAL  Final  Urine culture     Status: Abnormal   Collection Time: 06/07/16  9:10 PM  Result Value Ref Range Status   Specimen Description URINE, RANDOM  Final   Special Requests NONE  Final   Culture (A)  Final    >=100,000 COLONIES/mL ESCHERICHIA COLI Confirmed Extended Spectrum Beta-Lactamase Producer (ESBL) >=100,000 COLONIES/mL KLEBSIELLA PNEUMONIAE CARBAPENEMASE RESISTANT ENTEROBACTERIACAE NOTIFIED INFECTION PREVENTION RESULT CALLED TO, READ BACK BY AND VERIFIED WITH: RN S.MOORE [id number] @1600  MLM Performed at [facility]    Report Status 06/11/2016 FINAL  Final   Organism ID, Bacteria ESCHERICHIA COLI (A)  Final   Organism ID, Bacteria KLEBSIELLA PNEUMONIAE (A)  Final      Susceptibility   Escherichia coli - MIC*    AMPICILLIN >=32 RESISTANT Resistant     CEFAZOLIN >=64 RESISTANT Resistant     CEFTRIAXONE >=64 RESISTANT Resistant     CIPROFLOXACIN >=4 RESISTANT Resistant     GENTAMICIN <=1 SENSITIVE Sensitive     IMIPENEM <=0.25 SENSITIVE Sensitive     NITROFURANTOIN <=16 SENSITIVE Sensitive     TRIMETH/SULFA <=20 SENSITIVE Sensitive     AMPICILLIN/SULBACTAM 16 INTERMEDIATE Intermediate     PIP/TAZO 8  SENSITIVE Sensitive     Extended ESBL POSITIVE Resistant     * >=100,000 COLONIES/mL ESCHERICHIA COLI   Klebsiella pneumoniae - MIC*    AMPICILLIN >=32 RESISTANT Resistant     CEFAZOLIN >=64 RESISTANT Resistant     CEFTRIAXONE >=64 RESISTANT Resistant  CIPROFLOXACIN >=4 RESISTANT Resistant     GENTAMICIN >=16 RESISTANT Resistant     IMIPENEM 8 RESISTANT Resistant     NITROFURANTOIN 256 RESISTANT Resistant     TRIMETH/SULFA >=320 RESISTANT Resistant     AMPICILLIN/SULBACTAM >=32 RESISTANT Resistant     PIP/TAZO >=128 RESISTANT Resistant     Extended ESBL NEGATIVE Sensitive     * >=100,000 COLONIES/mL KLEBSIELLA PNEUMONIAE  MRSA PCR Screening     Status: None   Collection Time: 06/08/16  4:16 AM  Result Value Ref Range Status   MRSA by PCR NEGATIVE NEGATIVE Final    Comment:        The GeneXpert MRSA Assay (FDA approved for NASAL specimens only), is one component of a comprehensive MRSA colonization surveillance program. It is not intended to diagnose MRSA infection nor to guide or monitor treatment for MRSA infections.   Culture, bal-quantitative     Status: Abnormal   Collection Time: 06/08/16  1:03 PM  Result Value Ref Range Status   Specimen Description BRONCHIAL WASHINGS  Final   Special Requests NONE  Final   Gram Stain   Final    FEW WBC PRESENT, PREDOMINANTLY PMN FEW GRAM NEGATIVE RODS RARE GRAM POSITIVE RODS RARE GRAM POSITIVE COCCI IN PAIRS Performed at [facility]    Culture >=100,000 COLONIES/mL PSEUDOMONAS AERUGINOSA (A)  Final   Report Status 06/10/2016 FINAL  Final   Organism ID, Bacteria PSEUDOMONAS AERUGINOSA (A)  Final      Susceptibility   Pseudomonas aeruginosa - MIC*    CEFTAZIDIME <=1 SENSITIVE Sensitive     CIPROFLOXACIN <=0.25 SENSITIVE Sensitive     GENTAMICIN <=1 SENSITIVE Sensitive     IMIPENEM 1 SENSITIVE Sensitive     PIP/TAZO <=4 SENSITIVE Sensitive     CEFEPIME <=1 SENSITIVE Sensitive     * >=100,000 COLONIES/mL PSEUDOMONAS  AERUGINOSA  Acid Fast Smear (AFB)     Status: None   Collection Time: 06/08/16  1:03 PM  Result Value Ref Range Status   AFB Specimen Processing Concentration  Final   Acid Fast Smear Negative  Final    Comment: (NOTE) Performed At: [facility] [address] [place], [place] [id number] Lindon Romp MD [id number]    Source (AFB) BRONCHIAL WASHINGS  Final  Culture, fungus without smear ([facility]-Only)     Status: None (Preliminary result)   Collection Time: 06/08/16  1:03 PM  Result Value Ref Range Status   Specimen Description BRONCHIAL WASHINGS  Final   Special Requests NONE  Final   Culture   Final    NO FUNGUS ISOLATED AFTER 14 DAYS Performed at [facility]    Report Status PENDING  Incomplete  C difficile quick scan w PCR reflex     Status: None   Collection Time: 06/20/16  2:59 PM  Result Value Ref Range Status   C Diff antigen NEGATIVE NEGATIVE Final   C Diff toxin NEGATIVE NEGATIVE Final   C Diff interpretation No C. difficile detected.  Final  Gastrointestinal Panel by PCR , Stool     Status: None   Collection Time: 06/20/16  2:59 PM  Result Value Ref Range Status   Campylobacter species NOT DETECTED NOT DETECTED Final   Plesimonas shigelloides NOT DETECTED NOT DETECTED Final   Salmonella species NOT DETECTED NOT DETECTED Final   Yersinia enterocolitica NOT DETECTED NOT DETECTED Final   Vibrio species NOT DETECTED NOT DETECTED Final   Vibrio cholerae NOT DETECTED NOT DETECTED Final  Enteroaggregative E coli (EAEC) NOT DETECTED NOT DETECTED Final   Enteropathogenic E coli (EPEC) NOT DETECTED NOT DETECTED Final   Enterotoxigenic E coli (ETEC) NOT DETECTED NOT DETECTED Final   Shiga like toxin producing E coli (STEC) NOT DETECTED NOT DETECTED Final   E. coli O157 NOT DETECTED NOT DETECTED Final   Shigella/Enteroinvasive E coli (EIEC) NOT DETECTED NOT DETECTED Final   Cryptosporidium NOT DETECTED NOT DETECTED Final   Cyclospora cayetanensis NOT  DETECTED NOT DETECTED Final   Entamoeba histolytica NOT DETECTED NOT DETECTED Final   Giardia lamblia NOT DETECTED NOT DETECTED Final   Adenovirus F40/41 NOT DETECTED NOT DETECTED Final   Astrovirus NOT DETECTED NOT DETECTED Final   Norovirus GI/GII NOT DETECTED NOT DETECTED Final   Rotavirus A NOT DETECTED NOT DETECTED Final   Sapovirus (I, II, IV, and V) NOT DETECTED NOT DETECTED Final    Studies/Results: No results found.  Assessment/Plan: Ebony [name] is a 70 y.o. 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 She also has LLL cavitary PNA with recent cx growing a quinolone sensitive Pseudomonas but AFB and fungal cx negative. Is currently s/p  ceftazidime from 7/31- present. Also received meropenem 7/29-7/31. Interestingly the ceftazidime will not treat the ESBL E coli nor the KPC in her urine but fu UA done 8/1 shows only 6-30 WBC (admit UA with TNTC wbc) and she does not have a foley cath in place.  She does have a rectal tube and is having diarrhea but C diff negative.   Recommendations Pseudomonal PNA in a patient with underlying  COPD  - noted to have thick secretions on bronch - rec a 21 day total abx course for this - the cipro, zosyn meropenem and ceftaz she received would be effective but can narrow to ciprofloxacin to which the PA was sensitive (start date 7/26) - Will need this through August 16th for now - can extend if still with significant Xray findings and purulent sputum   UTI - with KPC and ESBL E coli She has grown very resistant organisms which should should not have responded to the ceftazidime. Therefore since her WBC on UA has decreased and she has no UTI sxs now I think this is not significant and would not tailor abx towards this.   Diverticulitis - Would add flagyl to the cipro to treat this. May also prevent C diff. Thank you very much for the consult. Will follow  with you.  Mikaeel Petrow P   06/23/2016, 2:38 PM

## 2016-06-23 NOTE — Progress Notes (Signed)
Nutrition Follow-up  DOCUMENTATION CODES:   Obesity unspecified  INTERVENTION:  -Continue Osmolite 1.5 at rate of 50 ml/hr with Prostat supplementation  BID. Consider switching to night time feedings if pt continues to tolerate TF well, will continue to assess -Cater to pt preferences within dietary restrictions   NUTRITION DIAGNOSIS:   Inadequate oral intake related to acute illness as evidenced by NPO status.  Being addressed via TF, pleasure po  GOAL:   Provide needs based on ASPEN/SCCM guidelines  MONITOR:   TF tolerance, Weight trends, Vent status, Labs  REASON FOR ASSESSMENT:   Consult Enteral/tube feeding initiation and management  ASSESSMENT:   70 y/o female admitted with sepsis, respiratory failure and LLL PNA/cavitary lesions; CT adb with acute diverticulitis as well.  Patient has hx of breast CA s/p radiation therapy completed in March 2017.  PTA pt had a PEG in place but was eating some foods PO per her husband.  She is currently on enteral nutrition as sole source nutrition.  Patient asking about eating PO.  Pt alert, watching TF on visit today. Ate some grits at breakfast this AM.   Tolerating Osmolite 1.5 at rate of 50 ml/hr, Prostat BID, free water 200 mL q 8 hours  Diet Order:  Diet full liquid Room service appropriate? Yes with Assist; Fluid consistency: Thin  Skin:  Wound (see comment) (unstageable and stage III pressure ulcer on sacrum)  Last BM:  stool frequency has decreased and is now more formed, rectal tube removed this AM   Labs:   Glucose Profile:   Recent Labs  06/22/16 2353 06/23/16 0437 06/23/16 0727  GLUCAP 295* 217* 222*    Meds: ss novolog, lasix, lantus, ss novolog, prednisone taper  Height:   Ht Readings from Last 1 Encounters:  06/19/16 5\' 2"  (1.575 m)    Weight:   Wt Readings from Last 1 Encounters:  06/23/16 193 lb 8 oz (87.8 kg)    Ideal Body Weight:     BMI:  Body mass index is 35.39 kg/m.  Estimated  Nutritional Needs:   Kcal:  [id number] kcals   Protein:  >/= 90 g  Fluid:  >/= 1.7 L  EDUCATION NEEDS:   No education needs identified at this time  [place], [place], [place] [phone] Pager  [phone] Weekend/On-Call Pager

## 2016-06-23 NOTE — Progress Notes (Signed)
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 BAL lingula and LLL. Cytology negative for malignancy 07/27 CTAP: Findings worrisome for acute uncomplicated diverticulitis involving the descending colon within the left lower abdominal quadrant. No evidence of perforation or definable/drainable fluid collection on this noncontrast examination. Interval development of rather extensive consolidative wedge-shaped opacities within the left lower lobe with potential central cavitation worrisome for infection and/or aspiration 07/31 Extubated 08/02 Changed to SDU status. Goals of care discussion initiated with pt's husband. PT eval ordered 08/03 Transferred to telemetry. LUE edema noted. L [place] CVL removed and PICC ordered.  08/03 LUE venous US: negative for DVT 08/03 goals of care discussion: DNR in event of cardiac arrest. If re-intubated, short term only. No trach tube or LTACH 08/04 More awake and interactive. SLP eval ordered > full liquid diet ordered 08/05 TF rate decreased 8/7 increased rhonchi and some wheezing 8/11 wheezing improved with steroids and BD therapy  INDWELLING DEVICES:: ETT 07/27 >> 07/31 L [place] CVL 07/27 >> 08/03 RUE PICC 08/03 >>   MICRO DATA: Urine 07/26 >> ESBL E coli, carbapenem resistant klebsiella Blood 07/26 >> NEG MRSA PCR 07/27 >> NEG Resp (BAL) 07/27 >> pseudomonas (pansensitive)  >> AFB smear neg  >> fungal smear neg Urine 08/21 >> 6-30 WBC/HPF  ANTIMICROBIALS:  Vanc 07/26 >>  07/27 Pip-tazo 07/26 >> 07/29 Metronidazole 07/27 >> 07/28 Cipro 07/27 >> 07/28 Voriconazole 07/27 >> 08/01 Meropenem 07/29 >> 07/31 Ceftaz 07/31 >>   SUBJECTIVE:  RASS 0. + F/C. No distress. No new complaints, wheezing resolved, feeling better on minimal oxygen   VITAL SIGNS: Temp:  [97.6 F (36.4 C)-98.2 F (36.8 C)] 98.2 F (36.8 C) (07/31 0800) Pulse Rate:  [58-147] 76 (07/31 1000) Resp:  [12-27] 27 (07/31 1000) BP: (94-185)/(31-167) 124/55 (07/31 1000) SpO2:  [93 %-100 %] 100 % (07/31 1000) FiO2 (%):  [25 %] 25 % (07/31 0809) Weight:  [193 lb 2 oz (87.6 kg)] 193 lb 2 oz (87.6 kg) (07/31 0232)  PHYSICAL EXAMINATION:  Gen: Obese, NAD Neuro: CNs intact, MAEs HEENT: NCAT Pulmonary: few scattered wheezes Cardiovascular: occasional extrasystoles, no M Abdomen: Obese, soft, NT, +BS Extremities: 1+ symmetric pretibial edema  BMP Latest Ref Rng & Units 06/23/2016 06/22/2016 06/21/2016  Glucose 65 - 99 mg/dL 237(H) 308(H) 324(H)  BUN 6 - 20 mg/dL 17 15 18   Creatinine 0.44 - 1.00 mg/dL <0.30(L) <0.30(L) <0.30(L)  Sodium 135 - 145 mmol/L 135 136 134(L)  Potassium 3.5 - 5.1 mmol/L 3.8 4.1 3.7  Chloride 101 - 111 mmol/L 97(L) 97(L) 96(L)  CO2 22 - 32 mmol/L 31 32 32  Calcium 8.9 - 10.3 mg/dL 9.2 9.2 8.8(L)   CBC Latest Ref Rng & Units 06/22/2016 06/21/2016 06/19/2016  WBC 3.6 - 11.0 K/uL 3.7 - 3.7  Hemoglobin 12.0 - 16.0 g/dL 8.2(L) 7.9(L) 8.5(L)  Hematocrit 35.0 - 47.0 % 23.9(L) - 25.2(L)  Platelets 150 - 440 K/uL 216 - 160   CXR: NNF    PROFILE: 70 year old female history of breast cancer status post left lumpectomy andradiation therapy, left lung cavity, 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  --Continue bipap qhs.   COPD acute bronchitis-- better.  On Prednisone  taper -oxygen as needed   LLL cavitary PNA due to pseudomonas --Cont Ceftaz - she will need prolonged antibiotics (planned through 07/07/16 to complete 4 weeks).  --H/O aspergillus - s/p 3 months voriconazoleChronic dysphagia G tube present  Severe chronic debilitation --Continue to advance activity as tolerated.  --SNF - proceed when available.   The Patient requires high complexity decision making for assessment and support, frequent evaluation and titration of therapies. No further recs at this point  [name], M.D.  [facility]  Medical Director [place] Director [facility] Department

## 2016-06-23 NOTE — Progress Notes (Signed)
[place] [facility] [place] NAME: Ebony [name]    MR#:  [id number]  DATE OF BIRTH:  29-Aug-1946  SUBJECTIVE:  CHIEF COMPLAINT:   Chief Complaint  Patient presents with  . Constipation  . Other    decreased urine output  The patient is 70 year old Caucasian female with past medical history significant for history of breast cancer, COPD, depression, diabetes mellitus type 2, hypertension, hyperlipidemia, hypothyroidism, who presents to the hospital with complaints of urinary retention and constipation. She was found to be hypotensive and was admitted to the hospital for further evaluation. Due to abdominal discomfort. Patient had CT scan of abdomen and pelvis revealing acute diverticulitis, chest x-ray revealed a left lower lobe airspace disease, worrisome for pneumonia, BAL culture revealed Pseudomonas aeruginosa. In the hospital, she underwent bronchoscopic evaluation, cytology was negative for malignancy. Urine cultures revealed more than 100,000 colony-forming units of Escherichia coli and Klebsiella pneumonia resistant to multiple antibiotics, patient's antibiotics were changed from [place] to Cipro and Flagyl , it is recommended to continue antibiotics through August 16th. She feels comfortable today, denies any pain, continues to have diarrheal stool, and other pathogens and C. difficile was tested and it was negative. Dr. Ola Spurr recommended not to address urinary tract bacterial pathogens due to decrease in pyuria. Patient remained hyperglycemic, Now on 40 units of insulin Lantus  subcutaneously . Steroids are being tapered.  Patient was noted to have fluid overload, initiated on Lasix, unfortunately urinary output is not reported. Still she has dyspnea and orthopnea.  Review of Systems  Unable to perform ROS: Language  Constitutional: Positive for malaise/fatigue. Negative for chills, fever and weight loss.  HENT: Negative for congestion.    Eyes: Negative for blurred vision and double vision.  Respiratory: Positive for shortness of breath. Negative for cough, sputum production and wheezing.   Cardiovascular: Negative for chest pain, palpitations, orthopnea, leg swelling and PND.  Gastrointestinal: Negative for abdominal pain, blood in stool, constipation, diarrhea, nausea and vomiting.  Genitourinary: Negative for dysuria, frequency, hematuria and urgency.  Musculoskeletal: Negative for falls.  Neurological: Negative for dizziness, tremors, focal weakness and headaches.  Endo/Heme/Allergies: Does not bruise/bleed easily.  Psychiatric/Behavioral: Negative for depression. The patient does not have insomnia.     VITAL SIGNS: Blood pressure 128/64, pulse 79, temperature 97.7 F (36.5 C), temperature source Oral, resp. rate 20, height 5\' 2"  (1.575 m), weight 87.8 kg (193 lb 8 oz), SpO2 99 %.  PHYSICAL EXAMINATION:   GENERAL:  70 y.o.-year-old patient lying in the bed with no acute distress. Pale, chronically ill-looking. Better verbal interaction.  EYES: Pupils equal, round, reactive to light and accommodation. No scleral icterus. Extraocular muscles intact.  HEENT: Head atraumatic, normocephalic. Oropharynx and nasopharynx clear.  NECK:  Supple, no jugular venous distention. No thyroid enlargement, no tenderness.  LUNGS: Diminished breath sounds bilaterally, no wheezing, scattered rhonchi andless  crepitation bilaterally anteriorly.No  use of accessory muscles of respiration.  CARDIOVASCULAR: S1, S2 normal. No murmurs, rubs, or gallops.  ABDOMEN: Soft, nontender, nondistended. PEG tube is noted in upper abdomen . Bowel sounds present. No organomegaly or mass.  EXTREMITIES: . Trace  lower extremity and pedal edema, no cyanosis, or clubbing.  NEUROLOGIC: Cranial nerves II through XII are intact. Muscle strength 5/5 in all extremities. Sensation intact. Gait not checked.  PSYCHIATRIC: The patient is alert and oriented x 3. Has  difficulty speaking SKIN: No obvious rash, lesion, or ulcer.   ORDERS/RESULTS REVIEWED:   CBC  Recent Labs Lab 06/19/16 0544 06/21/16 0655 06/22/16 0500  WBC 3.7  --  3.7  HGB 8.5* 7.9* 8.2*  HCT 25.2*  --  23.9*  PLT 160  --  216  MCV 92.0  --  93.2  MCH 31.0  --  31.8  MCHC 33.7  --  34.1  RDW 16.1*  --  16.2*   ------------------------------------------------------------------------------------------------------------------  Chemistries   Recent Labs Lab 06/19/16 0544 06/21/16 0655 06/22/16 0500 06/23/16 0536  NA 139 134* 136 135  K 3.6 3.7 4.1 3.8  CL 99* 96* 97* 97*  CO2 36* 32 32 31  GLUCOSE 108* 324* 308* 237*  BUN 14 18 15 17   CREATININE <0.30* <0.30* <0.30* <0.30*  CALCIUM 9.0 8.8* 9.2 9.2   ------------------------------------------------------------------------------------------------------------------ CrCl cannot be calculated (This lab value cannot be used to calculate CrCl because it is not a number: <0.30). ------------------------------------------------------------------------------------------------------------------ No results for input(s): TSH, T4TOTAL, T3FREE, THYROIDAB in the last 72 hours.  Invalid input(s): FREET3  Cardiac Enzymes No results for input(s): CKMB, TROPONINI, MYOGLOBIN in the last 168 hours.  Invalid input(s): CK ------------------------------------------------------------------------------------------------------------------ Invalid input(s): POCBNP ---------------------------------------------------------------------------------------------------------------  RADIOLOGY: No results found.  EKG:  Orders placed or performed during the hospital encounter of 06/07/16  . EKG 12-Lead  . EKG 12-Lead  . EKG 12-Lead  . EKG 12-Lead    ASSESSMENT AND PLAN:  Active Problems:   Hypotension #1. Sepsis due to left lower lobe cavitary pneumonia due to Pseudomonas,  blood cultures were negative, continue patient on  Ciprofloxacin  per Dr. Blane Ohara recommendations, recommended to continue this antibiotics through August 16. Flagyl was added for colitis and possibly to aid with C. difficile prevention, however, Dr. Ola Spurr did not recommend to address ESBL Escherichia coli and carbapenem resistant Klebsiella UTI. Cipro/Flagyl course  duration is going to be determined by radiographic and clinical response, follow-up with pulmonary  and ID as outpatient. PICC was placed, but only1 port is  functioning at a time.  May need to replace PICC line We'll need to discuss with vascular.  #2. Malnutrition, I myself, speech therapist as palliative care discussed with patient risks as well as benefits of oral intake, patient chose to eat, continue full liquid diet ,  unable to get modified barium swallow study at this time, per speech therapist, continue tube feeds, dietary is consulted, patient was advised to be careful with oral intake, follow strict aspiration precautions. She is agreeable #3. Hypotension, resolved, likely malnutrition and sepsis related #4. Acute on chronic respiratory failure with hypoxia and mild hypercapnia due to left lower lobe cavitary pneumonia due to Pseudomonas,  and now fluid overload, continue oxygen therapy as needed, weaning off oxygen as tolerated, now on 2 L of oxygen through nasal cannula with good oxygenation, continue ciprofloxacin,  she will need prolonged antibiotic therapy, at least through 06/28/2016, per Dr. Ola Spurr,. Appreciate his  recommendations. Continue Lasix orally, follow patient's weight  #5. Diarrhea, stool cultures, including PCR for C. difficile are negative, getting norovirus testing, advance cholestyramine to 3 times a day, . Patient's stool is less watery, discontinue rectal tube. Follow clinically #6 ESBL Escherichia coli and carbapenem resistant Klebsiella bacteriuria, not being covered with current antibiotic therapy,Dr. Ola Spurr recommended not to initiate  therapy #7. Fluid overload, continue patient on Lasix, follow weights. Echocardiogram was done in April 2017, revealed normal ejection fraction.  Management plans discussed with the patient, family and they are in agreement.   DRUG ALLERGIES:  Allergies  Allergen Reactions  . Other Hives  and Rash    Shellfish. Shellfish Pt reports no allergic reaction to iodine or betadine.  . Shellfish Allergy Hives and Rash    CODE STATUS:     Code Status Orders        Start     Ordered   06/16/16 0954  Limited resuscitation (code)  Continuous    Question Answer Comment  In the event of cardiac or respiratory ARREST: Initiate Code Blue, Call Rapid Response Yes   In the event of cardiac or respiratory ARREST: Perform CPR No   In the event of cardiac or respiratory ARREST: Perform Intubation/Mechanical Ventilation Yes   In the event of cardiac or respiratory ARREST: Use NIPPV/BiPAp only if indicated Yes   In the event of cardiac or respiratory ARREST: Administer ACLS medications if indicated No   In the event of cardiac or respiratory ARREST: Perform Defibrillation or Cardioversion if indicated No      06/16/16 0953    Code Status History    Date Active Date Inactive Code Status Order ID Comments User Context   06/08/2016  4:16 AM 06/16/2016  9:53 AM Full Code [id number]  Harrie Foreman, MD Inpatient   02/27/2016 11:30 AM 03/22/2016  6:51 PM Full Code [id number]  Dustin Flock, MD ED   10/07/2015 11:30 AM 10/12/2015  5:35 PM Full Code [id number]  Theodoro Grist, MD Inpatient      TOTAL TIME TAKING CARE OF THIS PATIENT: 30 minutes.    Theodoro Grist M.D on 06/23/2016 at 10:53 AM  Between 7am to 6pm - Pager - [phone]  After 6pm go to [email or web address] - password [email or web address] [facility]  Office  [phone]  CC: Primary care physician; Renee Rival, NP

## 2016-06-24 LAB — GLUCOSE, CAPILLARY
Glucose-Capillary: 123 mg/dL — ABNORMAL HIGH (ref 65–99)
Glucose-Capillary: 132 mg/dL — ABNORMAL HIGH (ref 65–99)
Glucose-Capillary: 174 mg/dL — ABNORMAL HIGH (ref 65–99)
Glucose-Capillary: 181 mg/dL — ABNORMAL HIGH (ref 65–99)
Glucose-Capillary: 226 mg/dL — ABNORMAL HIGH (ref 65–99)
Glucose-Capillary: 256 mg/dL — ABNORMAL HIGH (ref 65–99)

## 2016-06-24 MED ORDER — BUDESONIDE 0.25 MG/2ML IN SUSP
0.2500 mg | Freq: Two times a day (BID) | RESPIRATORY_TRACT | Status: DC
  Administered 2016-06-24 – 2016-06-26 (×4): 0.25 mg via RESPIRATORY_TRACT
  Filled 2016-06-24 (×4): qty 2

## 2016-06-24 NOTE — Progress Notes (Signed)
[facility] at [place] NAME: Ebony [name]    MR#:  [id number]  DATE OF BIRTH:  [birth year]  SUBJECTIVE:   No acute events overnight. No diarrhea and no complaints presently.    REVIEW OF SYSTEMS:    Review of Systems  Constitutional: Negative for chills and fever.  HENT: Negative for congestion and tinnitus.   Eyes: Negative for blurred vision and double vision.  Respiratory: Negative for cough, shortness of breath and wheezing.   Cardiovascular: Negative for chest pain, orthopnea and PND.  Gastrointestinal: Negative for abdominal pain, diarrhea, nausea and vomiting.  Genitourinary: Negative for dysuria and hematuria.  Neurological: Negative for dizziness, sensory change and focal weakness.  All other systems reviewed and are negative.   Nutrition: Full liquid Tolerating Diet: Yes Tolerating PT: Eval noted.    DRUG ALLERGIES:   Allergies  Allergen Reactions  . Other Hives and Rash    Shellfish. Shellfish Pt reports no allergic reaction to iodine or betadine.  . Shellfish Allergy Hives and Rash    VITALS:  Blood pressure (!) 150/72, pulse 100, temperature 98.6 F (37 C), resp. rate (!) 22, height 5\' 2"  (1.575 m), weight 89.9 kg (198 lb 4.8 oz), SpO2 97 %.  PHYSICAL EXAMINATION:   Physical Exam  GENERAL:  70 y.o.-year-old obese patient lying in  bed in no acute distress.  EYES: Pupils equal, round, reactive to light and accommodation. No scleral icterus. Extraocular muscles intact.  HEENT: Head atraumatic, normocephalic. Oropharynx and nasopharynx clear.  NECK:  Supple, no jugular venous distention. No thyroid enlargement, no tenderness.  LUNGS: Normal breath sounds bilaterally, no wheezing, rales, rhonchi. No use of accessory muscles of respiration.  CARDIOVASCULAR: S1, S2 normal. No murmurs, rubs, or gallops.  ABDOMEN: Soft, nontender, nondistended. Bowel sounds present. No organomegaly or mass. + PEG tube in  place. EXTREMITIES: No cyanosis, clubbing or edema b/l.    NEUROLOGIC: Cranial nerves II through XII are intact. No focal Motor or sensory deficits b/l.  Globally weak.  PSYCHIATRIC: The patient is alert and oriented x 3.  SKIN: No obvious rash, lesion, or ulcer.    LABORATORY PANEL:   CBC  Recent Labs Lab 06/22/16 0500  WBC 3.7  HGB 8.2*  HCT 23.9*  PLT 216   ------------------------------------------------------------------------------------------------------------------  Chemistries   Recent Labs Lab 06/23/16 0536  NA 135  K 3.8  CL 97*  CO2 31  GLUCOSE 237*  BUN 17  CREATININE <0.30*  CALCIUM 9.2   ------------------------------------------------------------------------------------------------------------------  Cardiac Enzymes No results for input(s): TROPONINI in the last 168 hours. ------------------------------------------------------------------------------------------------------------------  RADIOLOGY:  No results found.   ASSESSMENT AND PLAN:   70 year old female with past history of breast cancer, COPD, depression, diabetes, hypertension, hyperlipidemia and hypothyroidism 70 year old female with past history to urinary retention and constipation and she was found to be hypotensive and septic.  1. Sepsis-secondary to a left lower lobe cavitary pneumonia secondary to pseudomonas. Blood cultures were negative but BAL was positive for Pseudomonas. -Patient was on IV Tressie Ellis, Zosyn but now has been switched over to just oral Cipro and will continue that until 06/28/2016. -Clinically afebrile and hemodynamically stable. -Patient's urine cultures grew out ESBL in Klebsiella but as per infectious disease no plan for treatment at this time as her urinalysis is improved and she has no GU symptoms presently.  2. Pneumonia-secondary to pseudomonas. Continue oral Cipro. -Continue treatment until 06/28/2016.  3. Malnutrition-patient has significant poor by mouth intake.  Currently on a  full liquid diet and eating only minimally. -Status post PEG tube placement and currently on tube feeds and tolerating it well.  - cont. Asp. Precautions.    4. Diarrhea-now resolved and improved. PCR for C. difficile and gastrointestinal PCR were negative. - cont. Cholestyramine.   5. Hypotension-secondary to sepsis and dehydration. Resolved with IV fluids and currently stable.  6. Diabetes-continue Lantus, NovoLog with meals and sliding scale insulin coverage. Blood sugar stable.  7. Hypothyroidism-continue Synthroid.  8. COPD-no acute exacerbation presently. Improving -Continue prednisone taper, DuoNeb' Pulmicort nebs.s  All the records are reviewed and case discussed with Care Management/Social Workerr. Management plans discussed with the patient, family and they are in agreement.  CODE STATUS: Partial  DVT Prophylaxis: Lovenox  TOTAL TIME TAKING CARE OF THIS PATIENT: 25 minutes.   POSSIBLE D/C IN 2-3 DAYS, DEPENDING ON CLINICAL CONDITION.   Henreitta Leber M.D on 06/24/2016 at 2:29 PM  Between 7am to 6pm - Pager - [phone]  After 6pm go to [email or web address] - password [email or web address] [facility]  Office  [phone]  CC: Primary care physician; Renee Rival, NP

## 2016-06-25 LAB — CBC
HEMATOCRIT: 26.7 % — AB (ref 35.0–47.0)
HEMOGLOBIN: 9 g/dL — AB (ref 12.0–16.0)
MCH: 31.5 pg (ref 26.0–34.0)
MCHC: 33.8 g/dL (ref 32.0–36.0)
MCV: 93.1 fL (ref 80.0–100.0)
Platelets: 272 10*3/uL (ref 150–440)
RBC: 2.86 MIL/uL — ABNORMAL LOW (ref 3.80–5.20)
RDW: 17.5 % — ABNORMAL HIGH (ref 11.5–14.5)
WBC: 6.5 10*3/uL (ref 3.6–11.0)

## 2016-06-25 LAB — GLUCOSE, CAPILLARY
Glucose-Capillary: 133 mg/dL — ABNORMAL HIGH (ref 65–99)
Glucose-Capillary: 153 mg/dL — ABNORMAL HIGH (ref 65–99)
Glucose-Capillary: 169 mg/dL — ABNORMAL HIGH (ref 65–99)
Glucose-Capillary: 172 mg/dL — ABNORMAL HIGH (ref 65–99)
Glucose-Capillary: 313 mg/dL — ABNORMAL HIGH (ref 65–99)
Glucose-Capillary: 326 mg/dL — ABNORMAL HIGH (ref 65–99)

## 2016-06-25 MED ORDER — HYDROCODONE-ACETAMINOPHEN 7.5-325 MG PO TABS
1.0000 | ORAL_TABLET | ORAL | Status: DC | PRN
  Administered 2016-06-25 – 2016-06-26 (×4): 1 via ORAL
  Filled 2016-06-25 (×5): qty 1

## 2016-06-25 NOTE — Progress Notes (Signed)
Pt Ebony [name] &o x 4. C/o lower extremity pain, medicated x 2 with morphine. G-tube intact/patent cont feeding at 52ml/hr. Tolerating well. Picc line R upper arm intact. Red line flushing well, purple occluded. Pt incontinent of urine. Turned and repositioned q 2hr. Foam pad to coccyx intact. Bipap used throughout the night. CBG q 4 hr required coverage per ss. vss afebrile. Pt had 1 approx 10 beat run paroximal SVT at 22:22. Pt care taking place at time of incident. Pt non-symptomatic.

## 2016-06-25 NOTE — Progress Notes (Signed)
[facility] at [place] NAME: Ebony [name]    MR#:  [id number]  DATE OF BIRTH:  1946/02/13  SUBJECTIVE:   No acute events overnight.    REVIEW OF SYSTEMS:    Review of Systems  Constitutional: Negative for chills and fever.  HENT: Negative for congestion and tinnitus.   Eyes: Negative for blurred vision and double vision.  Respiratory: Negative for cough, shortness of breath and wheezing.   Cardiovascular: Negative for chest pain, orthopnea and PND.  Gastrointestinal: Negative for abdominal pain, diarrhea, nausea and vomiting.  Genitourinary: Negative for dysuria and hematuria.  Neurological: Negative for dizziness, sensory change and focal weakness.  All other systems reviewed and are negative.   Nutrition: Full liquid Tolerating Diet: Yes Tolerating PT: Eval noted.    DRUG ALLERGIES:   Allergies  Allergen Reactions  . Other Hives and Rash    Shellfish. Shellfish Pt reports no allergic reaction to iodine or betadine.  . Shellfish Allergy Hives and Rash    VITALS:  Blood pressure (!) 111/34, pulse 87, temperature 97.2 F (36.2 C), temperature source Oral, resp. rate 18, height 5\' 2"  (1.575 m), weight 88.8 kg (195 lb 11.2 oz), SpO2 99 %.  PHYSICAL EXAMINATION:   Physical Exam  GENERAL:  70 y.o.-year-old obese patient lying in bed in no acute distress.  EYES: Pupils equal, round, reactive to light and accommodation. No scleral icterus. Extraocular muscles intact.  HEENT: Head atraumatic, normocephalic. Oropharynx and nasopharynx clear.  NECK:  Supple, no jugular venous distention. No thyroid enlargement, no tenderness.  LUNGS: Normal breath sounds bilaterally, no wheezing, rales, rhonchi. No use of accessory muscles of respiration.  CARDIOVASCULAR: S1, S2 normal. No murmurs, rubs, or gallops.  ABDOMEN: Soft, nontender, nondistended. Bowel sounds present. No organomegaly or mass. + PEG tube in place. EXTREMITIES: No cyanosis,  clubbing or edema b/l.    NEUROLOGIC: Cranial nerves II through XII are intact. No focal Motor or sensory deficits b/l.  Globally weak.  PSYCHIATRIC: The patient is alert and oriented x 3.  SKIN: No obvious rash, lesion, or ulcer.    LABORATORY PANEL:   CBC  Recent Labs Lab 06/25/16 0504  WBC 6.5  HGB 9.0*  HCT 26.7*  PLT 272   ------------------------------------------------------------------------------------------------------------------  Chemistries   Recent Labs Lab 06/23/16 0536  NA 135  K 3.8  CL 97*  CO2 31  GLUCOSE 237*  BUN 17  CREATININE <0.30*  CALCIUM 9.2   ------------------------------------------------------------------------------------------------------------------  Cardiac Enzymes No results for input(s): TROPONINI in the last 168 hours. ------------------------------------------------------------------------------------------------------------------  RADIOLOGY:  No results found.   ASSESSMENT AND PLAN:   70 year old female with past history of breast cancer, COPD, depression, diabetes, hypertension, hyperlipidemia and hypothyroidism presents to the hospital due to urinary retention and constipation and she was found to be hypotensive and septic.  1. Sepsis-secondary to a left lower lobe cavitary pneumonia secondary to pseudomonas. Blood cultures were negative but BAL was positive for Pseudomonas. -Patient was on IV Tressie Ellis, Zosyn but now has been switched over to just oral Cipro and will continue that until 06/28/2016. -Clinically afebrile and hemodynamically stable. -Patient's urine cultures grew out ESBL and Klebsiella but as per infectious disease no plan for treatment at this time as her urinalysis is improved and she has no GU symptoms presently.  2. Pneumonia-secondary to pseudomonas. Continue oral Cipro. -Continue treatment until 06/28/2016.  3. Malnutrition-patient has significant poor by mouth intake. Currently on a full liquid diet  and eating  only minimally. -Status post PEG tube placement and currently on tube feeds and tolerating it well.  - cont. Asp. Precautions.    4. Diarrhea-now resolved and improved. PCR for C. difficile and gastrointestinal PCR were negative. - cont. Cholestyramine. Flexseal removed.   5. Hypotension-secondary to sepsis and dehydration. Resolved with IV fluids and currently stable.  6. Diabetes-continue Lantus, NovoLog with meals and sliding scale insulin coverage. Blood sugar stable.  7. Hypothyroidism-continue Synthroid.  8. COPD-no acute exacerbation presently. Improving -Continue prednisone taper, DuoNeb' Pulmicort nebs  Likely d/c to SNF tomorrow.    All the records are reviewed and case discussed with Care Management/Social Workerr. Management plans discussed with the patient, family and they are in agreement.  CODE STATUS: Partial  DVT Prophylaxis: Lovenox  TOTAL TIME TAKING CARE OF THIS PATIENT: 25 minutes.   POSSIBLE D/C IN 1-2 DAYS, DEPENDING ON CLINICAL CONDITION.   Henreitta Leber M.D on 06/25/2016 at 1:23 PM  Between 7am to 6pm - Pager - [phone]  After 6pm go to [email or web address] - password [email or web address] [facility]  Office  [phone]  CC: Primary care physician; Renee Rival, NP

## 2016-06-26 ENCOUNTER — Encounter
Admission: RE | Admit: 2016-06-26 | Discharge: 2016-06-26 | Disposition: A | Discharge status: Home / Self Care | Payer: Medicare Other | Source: Ambulatory Visit | Attending: Internal Medicine | Admitting: Internal Medicine

## 2016-06-26 DIAGNOSIS — J449 Chronic obstructive pulmonary disease, unspecified: Secondary | ICD-10-CM | POA: Diagnosis not present

## 2016-06-26 DIAGNOSIS — R748 Abnormal levels of other serum enzymes: Secondary | ICD-10-CM | POA: Insufficient documentation

## 2016-06-26 LAB — GLUCOSE, CAPILLARY
Glucose-Capillary: 171 mg/dL — ABNORMAL HIGH (ref 65–99)
Glucose-Capillary: 192 mg/dL — ABNORMAL HIGH (ref 65–99)
Glucose-Capillary: 225 mg/dL — ABNORMAL HIGH (ref 65–99)
Glucose-Capillary: 288 mg/dL — ABNORMAL HIGH (ref 65–99)

## 2016-06-26 MED ORDER — CIPROFLOXACIN HCL 750 MG PO TABS: 750.0000 mg | ORAL_TABLET | Freq: Two times a day (BID) | ORAL | Status: AC

## 2016-06-26 MED ORDER — MORPHINE SULFATE (CONCENTRATE) 10 MG/0.5ML PO SOLN: 10.0000 mg | ORAL | 0 refills | Status: AC | PRN

## 2016-06-26 MED ORDER — OSMOLITE 1.5 CAL PO LIQD: 1000.0000 mL | ORAL | 0 refills | Status: AC

## 2016-06-26 MED ORDER — FAMOTIDINE 40 MG/5ML PO SUSR: 20.0000 mg | Freq: Every day | ORAL | 0 refills | Status: AC

## 2016-06-26 MED ORDER — INSULIN ASPART 100 UNIT/ML ~~LOC~~ SOLN: 4.0000 [IU] | SUBCUTANEOUS | 11 refills | Status: AC

## 2016-06-26 MED ORDER — METRONIDAZOLE 500 MG PO TABS: 500.0000 mg | ORAL_TABLET | Freq: Three times a day (TID) | ORAL | Status: AC

## 2016-06-26 MED ORDER — FREE WATER: 200.0000 mL | Freq: Three times a day (TID) | Status: AC

## 2016-06-26 NOTE — Progress Notes (Signed)
Patient transfer to SNF via EMS patient alert and oriented, V/S stable at the time of transport .

## 2016-06-26 NOTE — Progress Notes (Signed)
Checked to see if pre-authorization would be needed for non-emergent EMS transport. Per [employer] benefits obtained online through [employer], patient has a [employer] Medicare Advantage PPO policy.  Medicare PPO plans do not require pre-auth for non-emergent ground transports using service codes A0426 or A0428.   

## 2016-06-26 NOTE — Care Management Important Message (Signed)
Important Message  Patient Details  Name: Ebony [name] MRN: [id number] Date of Birth: 05/05/1946   Medicare Important Message Given:  Yes    Jolly Mango, RN 06/26/2016, 8:55 AM

## 2016-06-26 NOTE — Progress Notes (Signed)
Patient is being discharge today to SNF, report called to receiving nurse, picc line removed as per order, EMS called for transport.

## 2016-06-26 NOTE — Clinical Social Work Note (Signed)
Patient to be d/c'ed today to [facility].  Patient and family agreeable to plans will transport via ems RN to call report to [phone] Room 205.  MSW attempted to call patient's husband, left message on voice mail.  Evette Cristal, MSW Mon-Fri 8a-4:30p [phone]

## 2016-06-26 NOTE — Discharge Summary (Signed)
[facility] [place] NAME: Ebony [name]    MR#:  [id number]  DATE OF BIRTH:  08-03-1946  DATE OF ADMISSION:  06/07/2016 ADMITTING PHYSICIAN: Harrie Foreman, MD  DATE OF DISCHARGE: 06/26/2016  PRIMARY CARE PHYSICIAN: Renee Rival, NP    ADMISSION DIAGNOSIS:  Community acquired pneumonia [J18.9] Sepsis ([place]) [A41.9] Sepsis, due to unspecified organism ([place]) [A41.9]  DISCHARGE DIAGNOSIS:  Active Problems:   Hypotension   SECONDARY DIAGNOSIS:   Past Medical History:  Diagnosis Date  . Asthma   . Breast cancer ([place])   . Carpal tunnel syndrome   . COPD (chronic obstructive pulmonary disease) ([place])   . Depression   . Diabetes mellitus type 2, uncomplicated ([place])   . Hyperlipidemia   . Hypertension   . Hypothyroidism   . Osteoporosis, post-menopausal   . Psoriasis   . Sleep apnea   . Vitamin D deficiency     HOSPITAL COURSE:   70 year old female with past history of breast cancer, COPD, depression, diabetes, hypertension, hyperlipidemia and hypothyroidism presents to the hospital due to urinary retention and constipation and she was found to be hypotensive and septic.  1. Sepsis-secondary to a left lower lobe cavitary pneumonia secondary to pseudomonas. Blood cultures were negative but BAL was positive for Pseudomonas. -Patient was on IV Tressie Ellis, Zosyn but now has been switched over to just oral Cipro and will continue that until 06/28/2016. - she is Clinically afebrile and hemodynamically stable. -Patient's urine cultures grew out ESBL and Klebsiella but as per infectious disease no plan for treatment at this time as her urinalysis is improved and she has no GU symptoms presently.  2. Pneumonia-secondary to pseudomonas. Initially was on IV Fortaz, Zosyn.  She will Continue oral Cipro.  -Continue treatment until 06/28/2016.  3. Malnutrition-patient has significant poor by mouth intake. Currently on a full liquid diet  and eating only minimally. -Status post PEG tube placement and currently on tube feeds with Osmolite 1.5 cal at 50 cc/her via pump.  She is will cont. 200 ml of free water flushes every 8 hrs daily.  - she will cont. Strict aspiration precautions.   4. Diarrhea-now resolved and improved. PCR for C. difficile and gastrointestinal PCR were negative. - resolved. Flexiseal removed and will d/c Cholestyramine for now.   5. Hypotension-secondary to sepsis and dehydration. Resolved with IV fluids and currently hemodynamically stable.   6. Diabetes- pt's BS have remained stable and she will resume her Lantus, Novolog every 4 hrs.    7. Hypothyroidism- she will continue Synthroid.  8. COPD-she had an exacerbation due to pneumonia which has improved now.  - pt. Will resume her Breo, Symbicort, Albuterol nebulizer.    Pt. Is very deconditioned and high risk for rehospitalization.    DISCHARGE CONDITIONS:   Stable.   CONSULTS OBTAINED:  Treatment Team:  Leonel Ramsay, MD Erby Pian, MD  DRUG ALLERGIES:   Allergies  Allergen Reactions  . Other Hives and Rash    Shellfish. Shellfish Pt reports no allergic reaction to iodine or betadine.  . Shellfish Allergy Hives and Rash    DISCHARGE MEDICATIONS:     Medication List    STOP taking these medications   HUMALOG KWIKPEN 100 UNIT/ML KiwkPen Generic drug:  insulin lispro   INVOKANA 100 MG Tabs tablet Generic drug:  canagliflozin   voriconazole 200 MG tablet Commonly known as:  VFEND     TAKE these medications   aspirin EC  81 MG tablet Take 81 mg by mouth every morning.   BREO ELLIPTA 100-25 MCG/INH Aepb Generic drug:  fluticasone furoate-vilanterol Inhale 2 puffs into the lungs daily.   buPROPion 150 MG 24 hr tablet Commonly known as:  WELLBUTRIN XL Take 150 mg by mouth every morning.   ciprofloxacin 750 MG tablet Commonly known as:  CIPRO Take 1 tablet (750 mg total) by mouth 2 (two) times daily. Stop  date 06/28/16   clobetasol 0.05 % external solution Commonly known as:  TEMOVATE Apply 1 application topically daily as needed (for psoriasis.).   clonazePAM 0.25 MG disintegrating tablet Commonly known as:  KLONOPIN Take 1 tablet by mouth every 12 (twelve) hours.   dexamethasone 4 MG tablet Commonly known as:  DECADRON Take 4 mg by mouth daily.   escitalopram 20 MG tablet Commonly known as:  LEXAPRO Take 20 mg by mouth daily.   famotidine 40 MG/5ML suspension Commonly known as:  PEPCID Place 2.5 mLs (20 mg total) into feeding tube daily.   feeding supplement (OSMOLITE 1.5 CAL) Liqd Place 1,000 mLs into feeding tube continuous.   free water Soln Place 200 mLs into feeding tube every 8 (eight) hours.   furosemide 20 MG tablet Commonly known as:  LASIX Take 20 mg by mouth 2 (two) times daily.   gabapentin 100 MG capsule Commonly known as:  NEURONTIN Take 100 mg by mouth at bedtime.   glipiZIDE 5 MG tablet Commonly known as:  GLUCOTROL Take 1 tablet by mouth daily before lunch.   HUMIRA PEN-PSORIASIS STARTER 40 MG/0.8ML Pnkt Generic drug:  Adalimumab Inject 40 mg into the skin every 14 (fourteen) days.   insulin aspart 100 UNIT/ML injection Commonly known as:  novoLOG Inject 4 Units into the skin every 4 (four) hours.   insulin detemir 100 UNIT/ML injection Commonly known as:  LEVEMIR Inject 40 Units into the skin at bedtime.   letrozole 2.5 MG tablet Commonly known as:  FEMARA Take 2.5 mg by mouth daily.   levothyroxine 200 MCG tablet Commonly known as:  SYNTHROID, LEVOTHROID Take 200 mcg by mouth daily before breakfast. *Take along with 50 mcg to equal 250 mcg total dose*   levothyroxine 50 MCG tablet Commonly known as:  SYNTHROID, LEVOTHROID Take 50 mcg by mouth daily before breakfast. *Take along with 200 mcg tablet to equal 250 mcg total dose.*   losartan-hydrochlorothiazide 100-25 MG tablet Commonly known as:  HYZAAR Take 1 tablet by mouth daily. In  am.   metoprolol 50 MG tablet Commonly known as:  LOPRESSOR Take 50 mg by mouth 2 (two) times daily.   metroNIDAZOLE 500 MG tablet Commonly known as:  FLAGYL Take 1 tablet (500 mg total) by mouth every 8 (eight) hours.   morphine CONCENTRATE 10 MG/0.5ML Soln concentrated solution Take 0.5-1 mLs (10-20 mg total) by mouth every hour as needed for severe pain.   albuterol (2.5 MG/3ML) 0.083% nebulizer solution Commonly known as:  PROVENTIL Inhale 1 vial using nebulizer every six hours as needed for shortness of breath and/or wheezing.   PROAIR HFA 108 (90 Base) MCG/ACT inhaler Generic drug:  albuterol INHALE 2 PUFFS BY MOUTH EVERY 4 HOURS AS NEEDED FOR SHORTNESS OF BREATH AND/OR WHEEZING.   simvastatin 40 MG tablet Commonly known as:  ZOCOR Take 40 mg by mouth daily at 6 PM.   SYMBICORT 160-4.5 MCG/ACT inhaler Generic drug:  budesonide-formoterol INHALE 2 PUFFS BY MOUTH TWICE DAILY         DISCHARGE INSTRUCTIONS:   DIET:  Full liquid and also tube feedings.   DISCHARGE CONDITION:  Stable  ACTIVITY:  Activity as tolerated  OXYGEN:  Home Oxygen: Yes.     Oxygen Delivery: 2 liters/min via Patient connected to nasal cannula oxygen  DISCHARGE LOCATION:  nursing home   If you experience worsening of your admission symptoms, develop shortness of breath, life threatening emergency, suicidal or homicidal thoughts you must seek medical attention immediately by calling 911 or calling your MD immediately  if symptoms less severe.  You Must read complete instructions/literature along with all the possible adverse reactions/side effects for all the Medicines you take and that have been prescribed to you. Take any new Medicines after you have completely understood and accpet all the possible adverse reactions/side effects.   Please note  You were cared for by a hospitalist during your hospital stay. If you have any questions about your discharge medications or the care you  received while you were in the hospital after you are discharged, you can call the unit and asked to speak with the hospitalist on call if the hospitalist that took care of you is not available. Once you are discharged, your primary care physician will handle any further medical issues. Please note that NO REFILLS for any discharge medications will be authorized once you are discharged, as it is imperative that you return to your primary care physician (or establish a relationship with a primary care physician if you do not have one) for your aftercare needs so that they can reassess your need for medications and monitor your lab values.     Today   No acute complaints presently. NO events overnight.  Afebrile, hemodynamically stable.   VITAL SIGNS:  Blood pressure (!) 141/46, pulse (!) 101, temperature 98 F (36.7 C), resp. rate 20, height 5\' 2"  (1.575 m), weight 86.8 kg (191 lb 6.4 oz), SpO2 96 %.  I/O:   Intake/Output Summary (Last 24 hours) at 06/26/16 1211 Last data filed at 06/26/16 0700  Gross per 24 hour  Intake          3434.16 ml  Output                0 ml  Net          3434.16 ml    PHYSICAL EXAMINATION:   GENERAL:  70 y.o.-year-old obese patient lying in bed in no acute distress.  EYES: Pupils equal, round, reactive to light and accommodation. No scleral icterus. Extraocular muscles intact.  HEENT: Head atraumatic, normocephalic. Oropharynx and nasopharynx clear.  NECK:  Supple, no jugular venous distention. No thyroid enlargement, no tenderness.  LUNGS: Normal breath sounds bilaterally, no wheezing, rales, rhonchi. No use of accessory muscles of respiration.  CARDIOVASCULAR: S1, S2 normal. No murmurs, rubs, or gallops.  ABDOMEN: Soft, nontender, nondistended. Bowel sounds present. No organomegaly or mass. + PEG tube in place. EXTREMITIES: No cyanosis, clubbing, + 1 dependent edema b/l.   NEUROLOGIC: Cranial nerves II through XII are intact. No focal Motor or sensory  deficits b/l.  Globally weak and deconditioned.  PSYCHIATRIC: The patient is alert and oriented x 3.  SKIN: No obvious rash, lesion, or ulcer.    DATA REVIEW:   CBC  Recent Labs Lab 06/25/16 0504  WBC 6.5  HGB 9.0*  HCT 26.7*  PLT 272    Chemistries   Recent Labs Lab 06/23/16 0536  NA 135  K 3.8  CL 97*  CO2 31  GLUCOSE 237*  BUN 17  CREATININE <  0.30*  CALCIUM 9.2    Cardiac Enzymes No results for input(s): TROPONINI in the last 168 hours.  Microbiology Results  Results for orders placed or performed during the hospital encounter of 06/07/16  Culture, blood (Routine x 2)     Status: None   Collection Time: 06/07/16  9:09 PM  Result Value Ref Range Status   Specimen Description BLOOD RIGHT HAND  Final   Special Requests BOTTLES DRAWN AEROBIC AND ANAEROBIC 5ML  Final   Culture NO GROWTH 5 DAYS  Final   Report Status 06/13/2016 FINAL  Final  Culture, blood (Routine x 2)     Status: None   Collection Time: 06/07/16  9:10 PM  Result Value Ref Range Status   Specimen Description BLOOD RIGHT ARM  Final   Special Requests BOTTLES DRAWN AEROBIC AND ANAEROBIC 5ML  Final   Culture NO GROWTH 5 DAYS  Final   Report Status 06/13/2016 FINAL  Final  Urine culture     Status: Abnormal   Collection Time: 06/07/16  9:10 PM  Result Value Ref Range Status   Specimen Description URINE, RANDOM  Final   Special Requests NONE  Final   Culture (A)  Final    >=100,000 COLONIES/mL ESCHERICHIA COLI Confirmed Extended Spectrum Beta-Lactamase Producer (ESBL) >=100,000 COLONIES/mL KLEBSIELLA PNEUMONIAE CARBAPENEMASE RESISTANT ENTEROBACTERIACAE NOTIFIED INFECTION PREVENTION RESULT CALLED TO, READ BACK BY AND VERIFIED WITH: RN S.MOORE [id number] @1600  MLM Performed at [facility]    Report Status 06/11/2016 FINAL  Final   Organism ID, Bacteria ESCHERICHIA COLI (A)  Final   Organism ID, Bacteria KLEBSIELLA PNEUMONIAE (A)  Final      Susceptibility   Escherichia coli - MIC*     AMPICILLIN >=32 RESISTANT Resistant     CEFAZOLIN >=64 RESISTANT Resistant     CEFTRIAXONE >=64 RESISTANT Resistant     CIPROFLOXACIN >=4 RESISTANT Resistant     GENTAMICIN <=1 SENSITIVE Sensitive     IMIPENEM <=0.25 SENSITIVE Sensitive     NITROFURANTOIN <=16 SENSITIVE Sensitive     TRIMETH/SULFA <=20 SENSITIVE Sensitive     AMPICILLIN/SULBACTAM 16 INTERMEDIATE Intermediate     PIP/TAZO 8 SENSITIVE Sensitive     Extended ESBL POSITIVE Resistant     * >=100,000 COLONIES/mL ESCHERICHIA COLI   Klebsiella pneumoniae - MIC*    AMPICILLIN >=32 RESISTANT Resistant     CEFAZOLIN >=64 RESISTANT Resistant     CEFTRIAXONE >=64 RESISTANT Resistant     CIPROFLOXACIN >=4 RESISTANT Resistant     GENTAMICIN >=16 RESISTANT Resistant     IMIPENEM 8 RESISTANT Resistant     NITROFURANTOIN 256 RESISTANT Resistant     TRIMETH/SULFA >=320 RESISTANT Resistant     AMPICILLIN/SULBACTAM >=32 RESISTANT Resistant     PIP/TAZO >=128 RESISTANT Resistant     Extended ESBL NEGATIVE Sensitive     * >=100,000 COLONIES/mL KLEBSIELLA PNEUMONIAE  MRSA PCR Screening     Status: None   Collection Time: 06/08/16  4:16 AM  Result Value Ref Range Status   MRSA by PCR NEGATIVE NEGATIVE Final    Comment:        The GeneXpert MRSA Assay (FDA approved for NASAL specimens only), is one component of a comprehensive MRSA colonization surveillance program. It is not intended to diagnose MRSA infection nor to guide or monitor treatment for MRSA infections.   Culture, bal-quantitative     Status: Abnormal   Collection Time: 06/08/16  1:03 PM  Result Value Ref Range Status   Specimen Description BRONCHIAL WASHINGS  Final   Special Requests NONE  Final   Gram Stain   Final    FEW WBC PRESENT, PREDOMINANTLY PMN FEW GRAM NEGATIVE RODS RARE GRAM POSITIVE RODS RARE GRAM POSITIVE COCCI IN PAIRS Performed at [facility]    Culture >=100,000 COLONIES/mL PSEUDOMONAS AERUGINOSA (A)  Final   Report Status 06/10/2016  FINAL  Final   Organism ID, Bacteria PSEUDOMONAS AERUGINOSA (A)  Final      Susceptibility   Pseudomonas aeruginosa - MIC*    CEFTAZIDIME <=1 SENSITIVE Sensitive     CIPROFLOXACIN <=0.25 SENSITIVE Sensitive     GENTAMICIN <=1 SENSITIVE Sensitive     IMIPENEM 1 SENSITIVE Sensitive     PIP/TAZO <=4 SENSITIVE Sensitive     CEFEPIME <=1 SENSITIVE Sensitive     * >=100,000 COLONIES/mL PSEUDOMONAS AERUGINOSA  Acid Fast Smear (AFB)     Status: None   Collection Time: 06/08/16  1:03 PM  Result Value Ref Range Status   AFB Specimen Processing Concentration  Final   Acid Fast Smear Negative  Final    Comment: (NOTE) Performed At: [facility] [address] [place], [place] [id number] Lindon Romp MD [id number]    Source (AFB) BRONCHIAL WASHINGS  Final  Culture, fungus without smear ([facility]-Only)     Status: None (Preliminary result)   Collection Time: 06/08/16  1:03 PM  Result Value Ref Range Status   Specimen Description BRONCHIAL WASHINGS  Final   Special Requests NONE  Final   Culture   Final    NO FUNGUS ISOLATED AFTER 14 DAYS Performed at [facility]    Report Status PENDING  Incomplete  C difficile quick scan w PCR reflex     Status: None   Collection Time: 06/20/16  2:59 PM  Result Value Ref Range Status   C Diff antigen NEGATIVE NEGATIVE Final   C Diff toxin NEGATIVE NEGATIVE Final   C Diff interpretation No C. difficile detected.  Final  Gastrointestinal Panel by PCR , Stool     Status: None   Collection Time: 06/20/16  2:59 PM  Result Value Ref Range Status   Campylobacter species NOT DETECTED NOT DETECTED Final   Plesimonas shigelloides NOT DETECTED NOT DETECTED Final   Salmonella species NOT DETECTED NOT DETECTED Final   Yersinia enterocolitica NOT DETECTED NOT DETECTED Final   Vibrio species NOT DETECTED NOT DETECTED Final   Vibrio cholerae NOT DETECTED NOT DETECTED Final   Enteroaggregative E coli (EAEC) NOT DETECTED NOT DETECTED Final    Enteropathogenic E coli (EPEC) NOT DETECTED NOT DETECTED Final   Enterotoxigenic E coli (ETEC) NOT DETECTED NOT DETECTED Final   Shiga like toxin producing E coli (STEC) NOT DETECTED NOT DETECTED Final   E. coli O157 NOT DETECTED NOT DETECTED Final   Shigella/Enteroinvasive E coli (EIEC) NOT DETECTED NOT DETECTED Final   Cryptosporidium NOT DETECTED NOT DETECTED Final   Cyclospora cayetanensis NOT DETECTED NOT DETECTED Final   Entamoeba histolytica NOT DETECTED NOT DETECTED Final   Giardia lamblia NOT DETECTED NOT DETECTED Final   Adenovirus F40/41 NOT DETECTED NOT DETECTED Final   Astrovirus NOT DETECTED NOT DETECTED Final   Norovirus GI/GII NOT DETECTED NOT DETECTED Final   Rotavirus A NOT DETECTED NOT DETECTED Final   Sapovirus (I, II, IV, and V) NOT DETECTED NOT DETECTED Final    RADIOLOGY:  No results found.    Management plans discussed with the patient, family and they are in agreement.  CODE STATUS:  Code Status Orders        Start     Ordered   06/16/16 0954  Limited resuscitation (code)  Continuous    Question Answer Comment  In the event of cardiac or respiratory ARREST: Initiate Code Blue, Call Rapid Response Yes   In the event of cardiac or respiratory ARREST: Perform CPR No   In the event of cardiac or respiratory ARREST: Perform Intubation/Mechanical Ventilation Yes   In the event of cardiac or respiratory ARREST: Use NIPPV/BiPAp only if indicated Yes   In the event of cardiac or respiratory ARREST: Administer ACLS medications if indicated No   In the event of cardiac or respiratory ARREST: Perform Defibrillation or Cardioversion if indicated No      06/16/16 0953    Code Status History    Date Active Date Inactive Code Status Order ID Comments User Context   06/08/2016  4:16 AM 06/16/2016  9:53 AM Full Code [id number]  Harrie Foreman, MD Inpatient   02/27/2016 11:30 AM 03/22/2016  6:51 PM Full Code [id number]  Dustin Flock, MD ED   10/07/2015 11:30 AM  10/12/2015  5:35 PM Full Code [id number]  Theodoro Grist, MD Inpatient      TOTAL TIME TAKING CARE OF THIS PATIENT: 40 minutes.    Henreitta Leber M.D on 06/26/2016 at 12:11 PM  Between 7am to 6pm - Pager - [phone]  After 6pm go to [email or web address] - password [email or web address] [facility]  Office  [phone]  CC: Primary care physician; Renee Rival, NP

## 2016-06-29 ENCOUNTER — Emergency Department
Admission: EM | Admit: 2016-06-29 | Discharge: 2016-06-29 | Disposition: A | Discharge status: Home / Self Care | Payer: Medicare Other | Attending: Emergency Medicine | Admitting: Emergency Medicine

## 2016-06-29 DIAGNOSIS — J45909 Unspecified asthma, uncomplicated: Secondary | ICD-10-CM | POA: Insufficient documentation

## 2016-06-29 DIAGNOSIS — T85598A Other mechanical complication of other gastrointestinal prosthetic devices, implants and grafts, initial encounter: Secondary | ICD-10-CM

## 2016-06-29 DIAGNOSIS — I10 Essential (primary) hypertension: Secondary | ICD-10-CM | POA: Diagnosis not present

## 2016-06-29 DIAGNOSIS — Z87891 Personal history of nicotine dependence: Secondary | ICD-10-CM | POA: Diagnosis not present

## 2016-06-29 DIAGNOSIS — K9423 Gastrostomy malfunction: Secondary | ICD-10-CM | POA: Diagnosis not present

## 2016-06-29 DIAGNOSIS — E119 Type 2 diabetes mellitus without complications: Secondary | ICD-10-CM | POA: Insufficient documentation

## 2016-06-29 DIAGNOSIS — E039 Hypothyroidism, unspecified: Secondary | ICD-10-CM | POA: Insufficient documentation

## 2016-06-29 DIAGNOSIS — Z794 Long term (current) use of insulin: Secondary | ICD-10-CM | POA: Diagnosis not present

## 2016-06-29 DIAGNOSIS — Z853 Personal history of malignant neoplasm of breast: Secondary | ICD-10-CM | POA: Diagnosis not present

## 2016-06-29 DIAGNOSIS — J449 Chronic obstructive pulmonary disease, unspecified: Secondary | ICD-10-CM | POA: Diagnosis not present

## 2016-06-29 DIAGNOSIS — Z79899 Other long term (current) drug therapy: Secondary | ICD-10-CM | POA: Insufficient documentation

## 2016-06-29 DIAGNOSIS — Z7982 Long term (current) use of aspirin: Secondary | ICD-10-CM | POA: Insufficient documentation

## 2016-06-29 NOTE — ED Triage Notes (Signed)
Pt sent from [facility] for hole noted in g tube.

## 2016-06-29 NOTE — ED Notes (Signed)
G tube evaluated by md and repaired per MD

## 2016-06-29 NOTE — ED Notes (Signed)
Patient discharged to home per MD order. Patient in stable condition, and deemed medically cleared by ED provider for discharge. Discharge instructions reviewed with patient/family using "Teach Back"; verbalized understanding of medication education and administration, and information about follow-up care. Denies further concerns. ° °

## 2016-06-29 NOTE — Discharge Instructions (Signed)
Please contact the doctors that placed the feeding tube to see if they would like to replace it. Please seek medical attention for any high fevers, chest pain, shortness of breath, change in behavior, persistent vomiting, bloody stool or any other new or concerning symptoms.

## 2016-06-29 NOTE — ED Notes (Signed)
Pt here for hole noted in gtube per facility, pt has no complaints at this time.

## 2016-06-29 NOTE — ED Provider Notes (Signed)
[facility] Emergency Department Provider Note   ____________________________________________   I have reviewed the triage vital signs and the nursing notes.   HISTORY  Chief Complaint Other   History limited by: Not Limited   HPI Ebony [name] is a 70 y.o. female stents to the emergency department today via EMS because of concerns for G-tube malfunction. Patient is a nursing home and staff noticed a small hole in the external part of the G-tube. Unclear how this occurred. Patient states that she can take some food orally however does use the G-tube for some medications. Patient denies any abdominal pain. No recent fevers.    Past Medical History:  Diagnosis Date  . Asthma   . Breast cancer ([place])   . Carpal tunnel syndrome   . COPD (chronic obstructive pulmonary disease) ([place])   . Depression   . Diabetes mellitus type 2, uncomplicated ([place])   . Hyperlipidemia   . Hypertension   . Hypothyroidism   . Osteoporosis, post-menopausal   . Psoriasis   . Sleep apnea   . Vitamin D deficiency     Patient Active Problem List   Diagnosis Date Noted  . Hypotension 06/08/2016  . Pressure ulcer 03/18/2016  . Problems with swallowing and mastication   . COPD with exacerbation ([place])   . Mild malnutrition ([place])   . History of infection not responsive to antibiotic therapy   . Acute respiratory failure ([place]) 02/27/2016  . Aftercare following surgery   . Acute respiratory failure with hypoxia ([place]) 10/07/2015  . COPD exacerbation ([place]) 10/07/2015  . Community acquired pneumonia 10/07/2015  . Hyperkalemia 10/07/2015  . Breast cancer, stage 1 ([place])   . Cancer of left female breast ([place]) 09/09/2015  . Mild chronic obstructive pulmonary disease ([place]) 06/25/2014  . Inflamed nasal mucosa 06/25/2014  . Apnea, sleep 06/25/2014    Past Surgical History:  Procedure Laterality Date  . BREAST EXCISIONAL BIOPSY Left 10/06/2015   np for surgery lumpectomy  .  BREAST LUMPECTOMY WITH NEEDLE LOCALIZATION Left 10/06/2015   Procedure: BREAST LUMPECTOMY WITH NEEDLE LOCALIZATION;  Surgeon: Hubbard Robinson, MD;  Location: [facility] ORS;  Service: General;  Laterality: Left;  . carpal tunnel Right 1995  . Right heel spur removed    . SENTINEL NODE BIOPSY Left 10/06/2015   Procedure: SENTINEL NODE BIOPSY;  Surgeon: Hubbard Robinson, MD;  Location: [facility] ORS;  Service: General;  Laterality: Left;  . THUMB ARTHROSCOPY Left 2000   release of a nerve to the thumb.  . TRACHEOSTOMY TUBE PLACEMENT N/A 03/14/2016   Procedure: TRACHEOSTOMY;  Surgeon: Carloyn Manner, MD;  Location: [facility] ORS;  Service: ENT;  Laterality: N/A;  . TUBAL LIGATION      Prior to Admission medications   Medication Sig Start Date End Date Taking? Authorizing Provider  Adalimumab (HUMIRA PEN-PSORIASIS STARTER) 40 MG/0.8ML PNKT Inject 40 mg into the skin every 14 (fourteen) days.     Historical Provider, MD  albuterol (PROAIR HFA) 108 (90 BASE) MCG/ACT inhaler INHALE 2 PUFFS BY MOUTH EVERY 4 HOURS AS NEEDED FOR SHORTNESS OF BREATH AND/OR WHEEZING. 09/28/14   Historical Provider, MD  albuterol (PROVENTIL) (2.5 MG/3ML) 0.083% nebulizer solution Inhale 1 vial using nebulizer every six hours as needed for shortness of breath and/or wheezing.    Historical Provider, MD  aspirin EC 81 MG tablet Take 81 mg by mouth every morning.     Historical Provider, MD  BREO ELLIPTA 100-25 MCG/INH AEPB Inhale 2 puffs into the lungs  daily. 05/25/16   Historical Provider, MD  budesonide-formoterol (SYMBICORT) 160-4.5 MCG/ACT inhaler INHALE 2 PUFFS BY MOUTH TWICE DAILY 09/15/14   Historical Provider, MD  buPROPion (WELLBUTRIN XL) 150 MG 24 hr tablet Take 150 mg by mouth every morning.     Historical Provider, MD  clobetasol (TEMOVATE) 0.05 % external solution Apply 1 application topically daily as needed (for psoriasis.).  02/15/16   Historical Provider, MD  clonazePAM (KLONOPIN) 0.25 MG disintegrating tablet Take 1  tablet by mouth every 12 (twelve) hours.  05/25/16   Historical Provider, MD  dexamethasone (DECADRON) 4 MG tablet Take 4 mg by mouth daily. 05/25/16   Historical Provider, MD  escitalopram (LEXAPRO) 20 MG tablet Take 20 mg by mouth daily. 05/25/16   Historical Provider, MD  famotidine (PEPCID) 40 MG/5ML suspension Place 2.5 mLs (20 mg total) into feeding tube daily. 06/26/16   Henreitta Leber, MD  furosemide (LASIX) 20 MG tablet Take 20 mg by mouth 2 (two) times daily.    Historical Provider, MD  gabapentin (NEURONTIN) 100 MG capsule Take 100 mg by mouth at bedtime. 05/25/16   Historical Provider, MD  glipiZIDE (GLUCOTROL) 5 MG tablet Take 1 tablet by mouth daily before lunch.  08/14/15   Historical Provider, MD  insulin aspart (NOVOLOG) 100 UNIT/ML injection Inject 4 Units into the skin every 4 (four) hours. 06/26/16   Henreitta Leber, MD  insulin detemir (LEVEMIR) 100 UNIT/ML injection Inject 40 Units into the skin at bedtime.    Historical Provider, MD  letrozole (FEMARA) 2.5 MG tablet Take 2.5 mg by mouth daily.    Historical Provider, MD  levothyroxine (SYNTHROID, LEVOTHROID) 200 MCG tablet Take 200 mcg by mouth daily before breakfast. *Take along with 50 mcg to equal 250 mcg total dose*    Historical Provider, MD  levothyroxine (SYNTHROID, LEVOTHROID) 50 MCG tablet Take 50 mcg by mouth daily before breakfast. *Take along with 200 mcg tablet to equal 250 mcg total dose.*    Historical Provider, MD  losartan-hydrochlorothiazide (HYZAAR) 100-25 MG tablet Take 1 tablet by mouth daily. In am.    Historical Provider, MD  metoprolol (LOPRESSOR) 50 MG tablet Take 50 mg by mouth 2 (two) times daily. 05/25/16   Historical Provider, MD  Morphine Sulfate (MORPHINE CONCENTRATE) 10 MG/0.5ML SOLN concentrated solution Take 0.5-1 mLs (10-20 mg total) by mouth every hour as needed for severe pain. 06/26/16   Henreitta Leber, MD  Nutritional Supplements (FEEDING SUPPLEMENT, OSMOLITE 1.5 CAL,) LIQD Place 1,000 mLs into  feeding tube continuous. 06/26/16   Henreitta Leber, MD  simvastatin (ZOCOR) 40 MG tablet Take 40 mg by mouth daily at 6 PM.     Historical Provider, MD  Water For Irrigation, Sterile (FREE WATER) SOLN Place 200 mLs into feeding tube every 8 (eight) hours. 06/26/16   Henreitta Leber, MD    Allergies Other and Shellfish allergy  Family History  Problem Relation Age of Onset  . Diabetes Mother   . Thyroid disease Mother   . COPD Father   . Emphysema Father   . Heart disease Brother   . Diabetes Brother   . Heart disease Brother   . Thyroid disease Daughter   . Thyroid disease Son     Social History Social History  Substance Use Topics  . Smoking status: Former Smoker    Packs/day: 0.50    Years: 30.00    Types: Cigarettes    Quit date: 10/05/2015  . Smokeless tobacco: Never Used  .  Alcohol use No    Review of Systems  Constitutional: Negative for fever. Cardiovascular: Negative for chest pain. Respiratory: Negative for shortness of breath. Gastrointestinal: Negative for abdominal pain, vomiting and diarrhea. Neurological: Negative for headaches, focal weakness or numbness.  10-point ROS otherwise negative.  ____________________________________________   PHYSICAL EXAM:  VITAL SIGNS: ED Triage Vitals  Enc Vitals Group     BP 06/29/16 0440 (!) 136/57     Pulse Rate 06/29/16 0440 80     Resp 06/29/16 0440 18     Temp 06/29/16 0440 98.6 F (37 C)     Temp Source 06/29/16 0440 Oral     SpO2 06/29/16 0440 96 %     Weight 06/29/16 0440 190 lb (86.2 kg)     Height 06/29/16 0440 5\' 2"  (1.575 m)     Head Circumference --      Peak Flow --      Pain Score 06/29/16 0442 0   Constitutional: Alert and oriented. Well appearing and in no distress. Eyes: Conjunctivae are normal. PERRL. Normal extraocular movements. ENT   Head: Normocephalic and atraumatic.   Nose: No congestion/rhinnorhea.   Mouth/Throat: Mucous membranes are moist.   Neck: No  stridor. Cardiovascular: Normal rate, regular rhythm.  No murmurs, rubs, or gallops. Respiratory: Normal respiratory effort without tachypnea nor retractions. Breath sounds are clear and equal bilaterally. No wheezes/rales/rhonchi. Gastrointestinal: Soft and nontender. No distention. G tube in place. Small hole identified roughly 10 centimeters from end of g-tube. Genitourinary: Deferred Musculoskeletal: Normal range of motion in all extremities. No joint effusions.   Neurologic:  Normal speech and language. No gross focal neurologic deficits are appreciated.  Skin:  Skin is warm, dry and intact. No rash noted. Psychiatric: Mood and affect are normal. Speech and behavior are normal. Patient exhibits appropriate insight and judgment.  ____________________________________________    LABS (pertinent positives/negatives)  None  ____________________________________________   EKG  None  ____________________________________________    RADIOLOGY  None  ____________________________________________   PROCEDURES  Procedures  ____________________________________________   INITIAL IMPRESSION / ASSESSMENT AND PLAN / ED COURSE  Pertinent labs & imaging results that were available during my care of the patient were reviewed by me and considered in my medical decision making (see chart for details).  Patient presented to the emergency department today from nursing facility because a small hole and external part of G-tube. On examination small hole was identified roughly 10 cm from the end of the G-tube. This piece of G-tube was cut and plug was inserted back into new end of G-tube. Will discharge patient back to nursing facility. ____________________________________________   FINAL CLINICAL IMPRESSION(S) / ED DIAGNOSES  Final diagnoses:  Feeding tube dysfunction, initial encounter     Note: This dictation was prepared with Dragon dictation. Any transcriptional errors that  result from this process are unintentional    Nance Pear, MD 06/29/16 [phone]

## 2016-06-30 LAB — CULTURE, FUNGUS WITHOUT SMEAR

## 2016-07-03 ENCOUNTER — Non-Acute Institutional Stay (SKILLED_NURSING_FACILITY): Payer: Medicare Other | Admitting: Gerontology

## 2016-07-03 DIAGNOSIS — F411 Generalized anxiety disorder: Secondary | ICD-10-CM | POA: Diagnosis not present

## 2016-07-03 DIAGNOSIS — R1084 Generalized abdominal pain: Secondary | ICD-10-CM | POA: Diagnosis not present

## 2016-07-07 DIAGNOSIS — J449 Chronic obstructive pulmonary disease, unspecified: Secondary | ICD-10-CM | POA: Diagnosis present

## 2016-07-07 DIAGNOSIS — R748 Abnormal levels of other serum enzymes: Secondary | ICD-10-CM | POA: Diagnosis not present

## 2016-07-07 LAB — CBC WITH DIFFERENTIAL/PLATELET
Basophils Absolute: 0 10*3/uL (ref 0–0.1)
Basophils Relative: 0 %
EOS PCT: 0 %
Eosinophils Absolute: 0 10*3/uL (ref 0–0.7)
HCT: 31.1 % — ABNORMAL LOW (ref 35.0–47.0)
Hemoglobin: 10.4 g/dL — ABNORMAL LOW (ref 12.0–16.0)
LYMPHS ABS: 1 10*3/uL (ref 1.0–3.6)
LYMPHS PCT: 7 %
MCH: 31.9 pg (ref 26.0–34.0)
MCHC: 33.6 g/dL (ref 32.0–36.0)
MCV: 95 fL (ref 80.0–100.0)
MONO ABS: 0.9 10*3/uL (ref 0.2–0.9)
Monocytes Relative: 6 %
Neutro Abs: 13.5 10*3/uL — ABNORMAL HIGH (ref 1.4–6.5)
Neutrophils Relative %: 87 %
PLATELETS: 287 10*3/uL (ref 150–440)
RBC: 3.27 MIL/uL — ABNORMAL LOW (ref 3.80–5.20)
RDW: 19.6 % — AB (ref 11.5–14.5)
WBC: 15.4 10*3/uL — ABNORMAL HIGH (ref 3.6–11.0)

## 2016-07-07 LAB — COMPREHENSIVE METABOLIC PANEL
ALT: 133 U/L — ABNORMAL HIGH (ref 14–54)
ANION GAP: 13 (ref 5–15)
AST: 87 U/L — ABNORMAL HIGH (ref 15–41)
Albumin: 3 g/dL — ABNORMAL LOW (ref 3.5–5.0)
Alkaline Phosphatase: 155 U/L — ABNORMAL HIGH (ref 38–126)
BILIRUBIN TOTAL: 0.5 mg/dL (ref 0.3–1.2)
BUN: 39 mg/dL — ABNORMAL HIGH (ref 6–20)
CHLORIDE: 88 mmol/L — AB (ref 101–111)
CO2: 34 mmol/L — AB (ref 22–32)
Calcium: 9.3 mg/dL (ref 8.9–10.3)
Creatinine, Ser: 0.42 mg/dL — ABNORMAL LOW (ref 0.44–1.00)
GFR calc Af Amer: >60 mL/min (ref >60)
GFR calc non Af Amer: >60 mL/min (ref >60)
GLUCOSE: 263 mg/dL — AB (ref 65–99)
POTASSIUM: 3.7 mmol/L (ref 3.5–5.1)
Sodium: 135 mmol/L (ref 135–145)
TOTAL PROTEIN: 6.3 g/dL — AB (ref 6.5–8.1)

## 2016-07-07 LAB — TSH: TSH: 0.124 u[IU]/mL — ABNORMAL LOW (ref 0.350–4.500)

## 2016-07-07 LAB — MAGNESIUM: Magnesium: 1.7 mg/dL (ref 1.7–2.4)

## 2016-07-08 LAB — VITAMIN B12: VITAMIN B 12: 234 pg/mL (ref 180–914)

## 2016-07-10 ENCOUNTER — Non-Acute Institutional Stay (SKILLED_NURSING_FACILITY): Payer: Medicare Other | Admitting: Gerontology

## 2016-07-10 DIAGNOSIS — F411 Generalized anxiety disorder: Secondary | ICD-10-CM | POA: Diagnosis not present

## 2016-07-10 DIAGNOSIS — R1084 Generalized abdominal pain: Secondary | ICD-10-CM | POA: Diagnosis not present

## 2016-07-10 DIAGNOSIS — J449 Chronic obstructive pulmonary disease, unspecified: Secondary | ICD-10-CM | POA: Diagnosis not present

## 2016-07-10 LAB — VITAMIN D 25 HYDROXY (VIT D DEFICIENCY, FRACTURES): VIT D 25 HYDROXY: 22.3 ng/mL — AB (ref 30.0–100.0)

## 2016-07-10 LAB — LIPID PANEL
CHOLESTEROL: 173 mg/dL (ref 0–200)
HDL: 68 mg/dL (ref >40)
LDL Cholesterol: 60 mg/dL (ref 0–99)
TRIGLYCERIDES: 226 mg/dL — AB (ref <150)
Total CHOL/HDL Ratio: 2.5 RATIO
VLDL: 45 mg/dL — ABNORMAL HIGH (ref 0–40)

## 2016-07-10 LAB — AMYLASE: Amylase: 55 U/L (ref 28–100)

## 2016-07-10 LAB — LIPASE, BLOOD: Lipase: 25 U/L (ref 11–51)

## 2016-07-10 NOTE — Progress Notes (Signed)
Location:      Place of Service:  SNF (31) Provider:  Toni Arthurs, NP-C  Renee Rival, NP  Patient Care Team: Renee Rival, NP as PCP - General (Nurse Practitioner)  Extended Emergency Contact Information Primary Emergency Contact: [name] Address: [address]          [place], [place] [address] [name] of [place] Phone: [phone] Relation: Spouse Secondary Emergency Contact: [name] [place] of [place] Phone: [phone] Mobile Phone: [phone] Relation: Daughter  Code Status:  full Goals of care: Advanced Directive information Advanced Directives 06/07/2016  Does patient have an advance directive? No  Would patient like information on creating an advanced directive? No - patient declined information     Chief Complaint  Patient presents with  . Acute Visit    HPI:  Pt is a 70 y.o. female seen today for an acute visit for Abdominal pain and anxiety.  She reports just generally not feeling well but unable to say exactly what the problem is.  During the assessment, she was tachycardiac and tachypneic.  She denies chest pain and shortness of breath.  She reports she has a history of anxiety that "comes and goes." Her abdominal pain is nonspecific.  More tender around PEG insertion site.  Abdomen appears distended and taut but not fluctuant.  No indication of ascites.  Irregular bowel pattern.  Vital signs stable.   Past Medical History:  Diagnosis Date  . Asthma   . Breast cancer ([place])   . Carpal tunnel syndrome   . COPD (chronic obstructive pulmonary disease) ([place])   . Depression   . Diabetes mellitus type 2, uncomplicated ([place])   . Hyperlipidemia   . Hypertension   . Hypothyroidism   . Osteoporosis, post-menopausal   . Psoriasis   . Sleep apnea   . Vitamin D deficiency    Past Surgical History:  Procedure Laterality Date  . BREAST EXCISIONAL BIOPSY Left 10/06/2015   np for surgery lumpectomy  .  BREAST LUMPECTOMY WITH NEEDLE LOCALIZATION Left 10/06/2015   Procedure: BREAST LUMPECTOMY WITH NEEDLE LOCALIZATION;  Surgeon: Hubbard Robinson, MD;  Location: [facility] ORS;  Service: General;  Laterality: Left;  . carpal tunnel Right 1995  . Right heel spur removed    . SENTINEL NODE BIOPSY Left 10/06/2015   Procedure: SENTINEL NODE BIOPSY;  Surgeon: Hubbard Robinson, MD;  Location: [facility] ORS;  Service: General;  Laterality: Left;  . THUMB ARTHROSCOPY Left 2000   release of a nerve to the thumb.  . TRACHEOSTOMY TUBE PLACEMENT N/A 03/14/2016   Procedure: TRACHEOSTOMY;  Surgeon: Carloyn Manner, MD;  Location: [facility] ORS;  Service: ENT;  Laterality: N/A;  . TUBAL LIGATION      Allergies  Allergen Reactions  . Other Hives and Rash    Shellfish. Shellfish Pt reports no allergic reaction to iodine or betadine.  . Shellfish Allergy Hives and Rash      Medication List       Accurate as of 07/03/16 11:59 PM. Always use your most recent med list.          aspirin EC 81 MG tablet Take 81 mg by mouth every morning.   BREO ELLIPTA 100-25 MCG/INH Aepb Generic drug:  fluticasone furoate-vilanterol Inhale 2 puffs into the lungs daily.   buPROPion 150 MG 24 hr tablet Commonly known as:  WELLBUTRIN XL Take 150 mg by mouth every morning.   clobetasol 0.05 % external solution Commonly known as:  TEMOVATE  Apply 1 application topically daily as needed (for psoriasis.).   clonazePAM 0.25 MG disintegrating tablet Commonly known as:  KLONOPIN Take 1 tablet by mouth every 12 (twelve) hours.   dexamethasone 4 MG tablet Commonly known as:  DECADRON Take 4 mg by mouth daily.   escitalopram 20 MG tablet Commonly known as:  LEXAPRO Take 20 mg by mouth daily.   famotidine 40 MG/5ML suspension Commonly known as:  PEPCID Place 2.5 mLs (20 mg total) into feeding tube daily.   feeding supplement (OSMOLITE 1.5 CAL) Liqd Place 1,000 mLs into feeding tube continuous.   free water Soln Place 200  mLs into feeding tube every 8 (eight) hours.   furosemide 20 MG tablet Commonly known as:  LASIX Take 20 mg by mouth 2 (two) times daily.   gabapentin 100 MG capsule Commonly known as:  NEURONTIN Take 100 mg by mouth at bedtime.   glipiZIDE 5 MG tablet Commonly known as:  GLUCOTROL Take 1 tablet by mouth daily before lunch.   HUMALOG 100 UNIT/ML injection Generic drug:  insulin lispro   HUMIRA PEN-PSORIASIS STARTER 40 MG/0.8ML Pnkt Generic drug:  Adalimumab Inject 40 mg into the skin every 14 (fourteen) days.   insulin aspart 100 UNIT/ML injection Commonly known as:  novoLOG Inject 4 Units into the skin every 4 (four) hours.   insulin detemir 100 UNIT/ML injection Commonly known as:  LEVEMIR Inject 40 Units into the skin at bedtime.   INVOKANA 100 MG Tabs tablet Generic drug:  canagliflozin   letrozole 2.5 MG tablet Commonly known as:  FEMARA Take 2.5 mg by mouth daily.   levothyroxine 200 MCG tablet Commonly known as:  SYNTHROID, LEVOTHROID Take 200 mcg by mouth daily before breakfast. *Take along with 50 mcg to equal 250 mcg total dose*   levothyroxine 50 MCG tablet Commonly known as:  SYNTHROID, LEVOTHROID Take 50 mcg by mouth daily before breakfast. *Take along with 200 mcg tablet to equal 250 mcg total dose.*   losartan-hydrochlorothiazide 100-25 MG tablet Commonly known as:  HYZAAR Take 1 tablet by mouth daily. In am.   metoprolol 50 MG tablet Commonly known as:  LOPRESSOR Take 50 mg by mouth 2 (two) times daily.   morphine CONCENTRATE 10 MG/0.5ML Soln concentrated solution Take 0.5-1 mLs (10-20 mg total) by mouth every hour as needed for severe pain.   albuterol (2.5 MG/3ML) 0.083% nebulizer solution Commonly known as:  PROVENTIL Inhale 1 vial using nebulizer every six hours as needed for shortness of breath and/or wheezing.   PROAIR HFA 108 (90 Base) MCG/ACT inhaler Generic drug:  albuterol INHALE 2 PUFFS BY MOUTH EVERY 4 HOURS AS NEEDED FOR  SHORTNESS OF BREATH AND/OR WHEEZING.   simvastatin 40 MG tablet Commonly known as:  ZOCOR Take 40 mg by mouth daily at 6 PM.   SPIRIVA HANDIHALER 18 MCG inhalation capsule Generic drug:  tiotropium   SYMBICORT 160-4.5 MCG/ACT inhaler Generic drug:  budesonide-formoterol INHALE 2 PUFFS BY MOUTH TWICE DAILY       Review of Systems  Constitutional: Positive for activity change and appetite change. Negative for chills, diaphoresis and fever.  HENT: Negative for congestion, sneezing, sore throat, trouble swallowing and voice change.   Eyes: Negative for pain, redness and visual disturbance.  Respiratory: Negative for apnea, cough, choking, chest tightness, shortness of breath and wheezing.   Cardiovascular: Negative for chest pain, palpitations and leg swelling.  Gastrointestinal: Positive for abdominal distention and abdominal pain. Negative for constipation, diarrhea, nausea and vomiting.  Genitourinary:  Negative for difficulty urinating, dysuria, frequency and urgency.  Musculoskeletal: Negative for back pain, gait problem and myalgias. Arthralgias: typical arthritis.  Skin: Positive for pallor and wound. Negative for color change and rash.  Neurological: Negative for dizziness, tremors, syncope, speech difficulty, weakness, numbness and headaches.  Psychiatric/Behavioral: Negative for agitation and behavioral problems.  All other systems reviewed and are negative.   Immunization History  Administered Date(s) Administered  . Influenza,inj,Quad PF,36+ Mos 09/07/2015   Pertinent  Health Maintenance Due  Topic Date Due  . COLONOSCOPY  05/06/1998  . PNA vac Low Risk Adult (1 of 2 - PCV13) 05/06/2013  . INFLUENZA VACCINE  06/13/2016  . MAMMOGRAM  08/24/2017  . DEXA SCAN  Completed   Fall Risk  02/21/2016  Falls in the past year? No   Functional Status Survey:    Vitals:   07/03/16 0500  BP: (!) 152/51  Resp: 20  Temp: 97.5 F (36.4 C)   There is no height or weight on  file to calculate BMI. Physical Exam  Constitutional: She is oriented to person, place, and time. Vital signs are normal. She appears well-developed and well-nourished. She is active and cooperative. She does not appear ill. No distress.  HENT:  Head: Normocephalic and atraumatic.  Mouth/Throat: Uvula is midline, oropharynx is clear and moist and mucous membranes are normal. Mucous membranes are not pale, not dry and not cyanotic.  Eyes: Conjunctivae, EOM and lids are normal. Pupils are equal, round, and reactive to light.  Neck: Trachea normal, normal range of motion and full passive range of motion without pain. Neck supple. No JVD present. No tracheal deviation, no edema and no erythema present. No thyromegaly present.  Cardiovascular: Regular rhythm, normal heart sounds, intact distal pulses and normal pulses.  Tachycardia present.  Exam reveals no gallop, no distant heart sounds and no friction rub.   No murmur heard. Pulmonary/Chest: Effort normal. No accessory muscle usage. Tachypnea noted. No respiratory distress. She has no decreased breath sounds. She has no wheezes. She has no rhonchi. She has no rales. She exhibits no tenderness.  Abdominal: Normal appearance and bowel sounds are normal. She exhibits distension. She exhibits no pulsatile liver, no fluid wave, no ascites and no pulsatile midline mass. There is generalized tenderness.  Musculoskeletal: Normal range of motion. She exhibits no edema or tenderness.  Expected osteoarthritis, stiffness  Neurological: She is alert and oriented to person, place, and time. She has normal strength.  Skin: Skin is warm, dry and intact. She is not diaphoretic. No cyanosis. No pallor. Nails show no clubbing.  Decubitus ulcer to ischial.  Psychiatric: She has a normal mood and affect. Her speech is normal and behavior is normal. Judgment and thought content normal. Cognition and memory are normal.  Nursing note and vitals reviewed.   Labs  reviewed:  Recent Labs  06/10/16 0417  06/12/16 0442 06/12/16 1714  06/22/16 0500 06/23/16 0536 07/07/16 1850  NA 150*  < > 148* 147*  < > 136 135 135  K 2.8*  < > 2.8* 3.5  < > 4.1 3.8 3.7  CL 116*  < > 113* 111  < > 97* 97* 88*  CO2 26  < > 30 29  < > 32 31 34*  GLUCOSE 263*  < > 216* 186*  < > 308* 237* 263*  BUN 63*  < > 43* 34*  < > 15 17 39*  CREATININE 0.81  < > 0.38* 0.37*  < > <0.30* <0.30* 0.42*  CALCIUM 8.9  < > 9.2 9.1  < > 9.2 9.2 9.3  MG 1.8  --  1.4* 1.8  --   --   --  1.7  PHOS 2.2*  --  1.4* 2.9  --   --   --   --   < > = values in this interval not displayed.  Recent Labs  06/07/16 2231 06/09/16 0540 07/07/16 1850  AST 25 29 87*  ALT 33 43 133*  ALKPHOS 72 110 155*  BILITOT 0.6 0.7 0.5  PROT 4.5* 5.5* 6.3*  ALBUMIN 1.6* 2.0* 3.0*    Recent Labs  03/10/16 0521  06/07/16 2106  06/22/16 0500 06/25/16 0504 07/07/16 1850  WBC 12.9*  < > 7.8  < > 3.7 6.5 15.4*  NEUTROABS 10.3*  --  6.7*  --   --   --  13.5*  HGB 9.9*  < > 12.7  < > 8.2* 9.0* 10.4*  HCT 31.4*  < > 39.2  < > 23.9* 26.7* 31.1*  MCV 93.3  < > 93.3  < > 93.2 93.1 95.0  PLT 305  < > 182  < > 216 272 287  < > = values in this interval not displayed. Lab Results  Component Value Date   TSH 0.124 (L) 07/07/2016   Lab Results  Component Value Date   HGBA1C 10.5 (H) 06/05/2016   Lab Results  Component Value Date   TRIG 253 (H) 06/11/2016    Significant Diagnostic Results in last 30 days:  Dg Chest 1 View  Result Date: 06/12/2016 CLINICAL DATA:  70 year old female with dyspnea. Diabetes. Breast cancer. Sleep apnea. Subsequent encounter. EXAM: CHEST 1 VIEW COMPARISON:  06/11/2016 chest x-ray.  03/08/2016 chest CT. FINDINGS: Portable exam with rotation to the right. Left central line tip unchanged appearing to project over the level of the proximal superior vena cava. Endotracheal tube tip 3.6 cm above the carina. No gross pneumothorax. Persistent consolidation left lung base possibly  with cavitation. The CT detected multi lobar airspace disease is not appreciated on the present plain film exam. If further delineation of lung parenchyma is clinically desired chest CT may be considered. Follow-up until clearance by plain film examination will be necessary to exclude underlying mass. Mild prominence mediastinal and cardiac silhouette is stable. No acute osseous abnormality. IMPRESSION: Persistent consolidation left lung base possibly with cavitation. The 03/08/2016 CT detected multi lobar airspace disease is not appreciated on the present plain film exam. Electronically Signed   By: Genia Del M.D.   On: 06/12/2016 07:51  Dg Chest 1 View  Result Date: 06/11/2016 CLINICAL DATA:  Dyspnea.  Breast cancer. EXAM: CHEST 1 VIEW COMPARISON:  06/10/2016. FINDINGS: Support tubes and apparatus are stable. Rounded opacity at the LEFT base is unchanged. No pneumothorax. Stable heart size. IMPRESSION: Persistent LEFT base infiltrate.  No improvement since priors. Electronically Signed   By: Staci Righter M.D.   On: 06/11/2016 07:19  US Venous Img Upper Uni Left  Result Date: 06/15/2016 CLINICAL DATA:  Left upper extremity edema acutely for 4 days. History of central line and diabetes. EXAM: LEFT UPPER EXTREMITY VENOUS DOPPLER ULTRASOUND TECHNIQUE: Gray-scale sonography with graded compression, as well as color Doppler and duplex ultrasound were performed to evaluate the upper extremity deep venous system from the level of the subclavian vein and including the jugular, axillary, basilic, radial, ulnar and upper cephalic vein. Spectral Doppler was utilized to evaluate flow at rest and with distal augmentation maneuvers. COMPARISON:  None.  FINDINGS: Contralateral Subclavian Vein: Respiratory phasicity is normal and symmetric with the symptomatic side. No evidence of thrombus. Normal compressibility. Internal Jugular Vein: No evidence of thrombus. Normal compressibility, respiratory phasicity and response  to augmentation. Subclavian Vein: No evidence of thrombus. Normal compressibility, respiratory phasicity and response to augmentation. Axillary Vein: No evidence of thrombus. Normal compressibility, respiratory phasicity and response to augmentation. Cephalic Vein: No evidence of thrombus. Normal compressibility, respiratory phasicity and response to augmentation. Basilic Vein: No evidence of thrombus. Normal compressibility, respiratory phasicity and response to augmentation. Brachial Veins: No evidence of thrombus. Normal compressibility, respiratory phasicity and response to augmentation. Radial Veins: No evidence of thrombus. Normal compressibility, respiratory phasicity and response to augmentation. Ulnar Veins: No evidence of thrombus. Normal compressibility, respiratory phasicity and response to augmentation. Venous Reflux:  None visualized. Other Findings:  None visualized. IMPRESSION: No evidence of deep venous thrombosis. Electronically Signed   By: Jerilynn Mages.  Shick M.D.   On: 06/15/2016 16:05   Dg Chest Port 1 View  Result Date: 06/19/2016 CLINICAL DATA:  Respiratory failure. EXAM: PORTABLE CHEST 1 VIEW COMPARISON:  06/15/2016. FINDINGS: Interim removal of left subclavian line. Right PICC line in stable position. Heart size normal. Mild improvement of left base infiltrate. Small left pleural effusion IMPRESSION: 1. Interim removal of left subclavian line. Right PICC line in stable position. 2. Left base atelectasis and/or infiltrate with slight interim improvement. Electronically Signed   By: Marcello Moores  Register   On: 06/19/2016 08:03   Dg Chest Port 1 View  Result Date: 06/15/2016 CLINICAL DATA:  Encounter for central line placement.  PICC line. EXAM: PORTABLE CHEST 1 VIEW COMPARISON:  One-view chest x-ray 06/13/2016 FINDINGS: The heart is mildly enlarged. Mild pulmonary vascular congestion is stable. Atherosclerotic changes are noted at the aortic arch. Left basilar airspace disease has slightly increased.  A left subclavian line is stable. A new right-sided PICC line is in place. The tip is at the cavoatrial junction. There is no pneumothorax. The visualized soft tissues and bony thorax are unremarkable. IMPRESSION: 1. Interval placement of right-sided PICC line without radiographic evidence for complication. 2. Stable left subclavian line. 3. Stable cardiomegaly and mild pulmonary vascular congestion. 4. Progressive left lower lobe airspace disease worrisome for pneumonia or aspiration. 5. Atherosclerosis of the thoracic aorta. Electronically Signed   By: San Morelle M.D.   On: 06/15/2016 12:40   Dg Chest Port 1 View  Result Date: 06/13/2016 CLINICAL DATA:  Respiratory failure EXAM: PORTABLE CHEST 1 VIEW COMPARISON:  June 12, 2016 FINDINGS: Endotracheal tube is been removed. Central catheter tip is in the superior vena cava just beyond the junction with the left innominate vein. No pneumothorax. There has been partial clearing of opacity from the left base. The area of questionable cavitation is less apparent compared to 1 day prior. Lungs elsewhere are clear. Heart is upper normal in size with pulmonary vascularity within normal limits. No adenopathy. No bone lesions. IMPRESSION: Central catheter as described. No pneumothorax. Partial but incomplete clearing of infiltrate from the left lower lobe. The area of questionable cavitation in the left lower lobe is less apparent compared to 1 day prior but still suggestive. No new opacity. No change in cardiac silhouette. Electronically Signed   By: Lowella Grip III M.D.   On: 06/13/2016 07:29    Assessment/Plan 1. Generalized abdominal pain  Complete series abdominal and chest xrays  Consider abdominal ultrasound if xrays negative and pain continues.   Labs   2. Generalized anxiety disorder  Continue  scheduled Clonazepam 0.5 mg po BID  Continue PRN doses of Clonazepam 0.125 mg po BID prn  Continue Celexa 20 mg po Q Day  Referral for  [facility] to eval  Family/ staff Communication:   Total Time: 30 minutes  Documentation: 15 minutes  Face to Face: 15 minutes  Family/Phone:   Labs/tests ordered:  Complete abdominal series xrays; cbc, met c, B12, D, mag+  Medication list reviewed and assessed for continued appropriateness.  Vikki Ports, NP-C [facility]iatrics [facility] [phone], [place] [address] Cell Phone (Mon-Fri 8am-5pm):  [phone] On Call:  [phone] & follow prompts after 5pm & weekends Office Phone:  [phone] Office Fax:  [phone]

## 2016-07-11 LAB — HEPATITIS PANEL, ACUTE
HCV Ab: <0.1 s/co ratio (ref 0.0–0.9)
HEP A IGM: NEGATIVE
Hep B C IgM: NEGATIVE
Hepatitis B Surface Ag: NEGATIVE

## 2016-07-11 NOTE — Progress Notes (Signed)
Location:      Place of Service:  SNF (31) Provider:  Toni Arthurs, NP-C  Renee Rival, NP  Patient Care Team: Renee Rival, NP as PCP - General (Nurse Practitioner)  Extended Emergency Contact Information Primary Emergency Contact: [name] Address: [address]          [name], [place] [address] [name] of [place] Phone: [phone] Relation: Spouse Secondary Emergency Contact: [name] [place] of [place] Phone: [phone] Mobile Phone: [phone] Relation: Daughter  Code Status:  full Goals of care: Advanced Directive information Advanced Directives 06/07/2016  Does patient have an advance directive? No  Would patient like information on creating an advanced directive? No - patient declined information     Chief Complaint  Patient presents with  . Follow-up    HPI:  Pt is a 70 y.o. female seen today for follow up visit for Abdominal pain and anxiety.  She reported, previously, just generally not feeling well but unable to say exactly what the problem is.  During the assessment, she was relaxed and cooperative, pleasant.  She denies chest pain and shortness of breath.  She reports she has a history of anxiety that "comes and goes." Her abdominal pain is gone.  No longer tender around PEG insertion site.  Abdomen appears soft and not fluctuant.  No indication of ascites.  Irregular bowel pattern.  Vital signs stable. Pt reports feeling much better today   Past Medical History:  Diagnosis Date  . Asthma   . Breast cancer ([place])   . Carpal tunnel syndrome   . COPD (chronic obstructive pulmonary disease) ([place])   . Depression   . Diabetes mellitus type 2, uncomplicated ([place])   . Hyperlipidemia   . Hypertension   . Hypothyroidism   . Osteoporosis, post-menopausal   . Psoriasis   . Sleep apnea   . Vitamin D deficiency    Past Surgical History:  Procedure Laterality Date  . BREAST EXCISIONAL BIOPSY Left  10/06/2015   np for surgery lumpectomy  . BREAST LUMPECTOMY WITH NEEDLE LOCALIZATION Left 10/06/2015   Procedure: BREAST LUMPECTOMY WITH NEEDLE LOCALIZATION;  Surgeon: Hubbard Robinson, MD;  Location: [facility] ORS;  Service: General;  Laterality: Left;  . carpal tunnel Right 1995  . Right heel spur removed    . SENTINEL NODE BIOPSY Left 10/06/2015   Procedure: SENTINEL NODE BIOPSY;  Surgeon: Hubbard Robinson, MD;  Location: [facility] ORS;  Service: General;  Laterality: Left;  . THUMB ARTHROSCOPY Left 2000   release of a nerve to the thumb.  . TRACHEOSTOMY TUBE PLACEMENT N/A 03/14/2016   Procedure: TRACHEOSTOMY;  Surgeon: Carloyn Manner, MD;  Location: [facility] ORS;  Service: ENT;  Laterality: N/A;  . TUBAL LIGATION      Allergies  Allergen Reactions  . Other Hives and Rash    Shellfish. Shellfish Pt reports no allergic reaction to iodine or betadine.  . Shellfish Allergy Hives and Rash      Medication List       Accurate as of 07/10/16 11:59 PM. Always use your most recent med list.          aspirin EC 81 MG tablet Take 81 mg by mouth every morning.   BREO ELLIPTA 100-25 MCG/INH Aepb Generic drug:  fluticasone furoate-vilanterol Inhale 2 puffs into the lungs daily.   buPROPion 150 MG 24 hr tablet Commonly known as:  WELLBUTRIN XL Take 150 mg by mouth every morning.   clobetasol 0.05 % external  solution Commonly known as:  TEMOVATE Apply 1 application topically daily as needed (for psoriasis.).   clonazePAM 0.25 MG disintegrating tablet Commonly known as:  KLONOPIN Take 1 tablet by mouth every 12 (twelve) hours.   dexamethasone 4 MG tablet Commonly known as:  DECADRON Take 4 mg by mouth daily.   escitalopram 20 MG tablet Commonly known as:  LEXAPRO Take 20 mg by mouth daily.   famotidine 40 MG/5ML suspension Commonly known as:  PEPCID Place 2.5 mLs (20 mg total) into feeding tube daily.   feeding supplement (OSMOLITE 1.5 CAL) Liqd Place 1,000 mLs into feeding  tube continuous.   free water Soln Place 200 mLs into feeding tube every 8 (eight) hours.   furosemide 20 MG tablet Commonly known as:  LASIX Take 20 mg by mouth 2 (two) times daily.   gabapentin 100 MG capsule Commonly known as:  NEURONTIN Take 100 mg by mouth at bedtime.   glipiZIDE 5 MG tablet Commonly known as:  GLUCOTROL Take 1 tablet by mouth daily before lunch.   HUMALOG 100 UNIT/ML injection Generic drug:  insulin lispro   HUMIRA PEN-PSORIASIS STARTER 40 MG/0.8ML Pnkt Generic drug:  Adalimumab Inject 40 mg into the skin every 14 (fourteen) days.   insulin aspart 100 UNIT/ML injection Commonly known as:  novoLOG Inject 4 Units into the skin every 4 (four) hours.   insulin detemir 100 UNIT/ML injection Commonly known as:  LEVEMIR Inject 40 Units into the skin at bedtime.   INVOKANA 100 MG Tabs tablet Generic drug:  canagliflozin   letrozole 2.5 MG tablet Commonly known as:  FEMARA Take 2.5 mg by mouth daily.   levothyroxine 200 MCG tablet Commonly known as:  SYNTHROID, LEVOTHROID Take 200 mcg by mouth daily before breakfast. *Take along with 50 mcg to equal 250 mcg total dose*   levothyroxine 50 MCG tablet Commonly known as:  SYNTHROID, LEVOTHROID Take 50 mcg by mouth daily before breakfast. *Take along with 200 mcg tablet to equal 250 mcg total dose.*   losartan-hydrochlorothiazide 100-25 MG tablet Commonly known as:  HYZAAR Take 1 tablet by mouth daily. In am.   metoprolol 50 MG tablet Commonly known as:  LOPRESSOR Take 50 mg by mouth 2 (two) times daily.   morphine CONCENTRATE 10 MG/0.5ML Soln concentrated solution Take 0.5-1 mLs (10-20 mg total) by mouth every hour as needed for severe pain.   albuterol (2.5 MG/3ML) 0.083% nebulizer solution Commonly known as:  PROVENTIL Inhale 1 vial using nebulizer every six hours as needed for shortness of breath and/or wheezing.   PROAIR HFA 108 (90 Base) MCG/ACT inhaler Generic drug:  albuterol INHALE 2  PUFFS BY MOUTH EVERY 4 HOURS AS NEEDED FOR SHORTNESS OF BREATH AND/OR WHEEZING.   simvastatin 40 MG tablet Commonly known as:  ZOCOR Take 40 mg by mouth daily at 6 PM.   SPIRIVA HANDIHALER 18 MCG inhalation capsule Generic drug:  tiotropium   SYMBICORT 160-4.5 MCG/ACT inhaler Generic drug:  budesonide-formoterol INHALE 2 PUFFS BY MOUTH TWICE DAILY       Review of Systems  Constitutional: Positive for activity change and appetite change. Negative for chills, diaphoresis and fever.  HENT: Negative for congestion, sneezing, sore throat, trouble swallowing and voice change.   Eyes: Negative for pain, redness and visual disturbance.  Respiratory: Negative for apnea, cough, choking, chest tightness, shortness of breath and wheezing.   Cardiovascular: Negative for chest pain, palpitations and leg swelling.  Gastrointestinal: Negative for abdominal distention, abdominal pain, constipation, diarrhea, nausea and  vomiting.  Genitourinary: Negative for difficulty urinating, dysuria, frequency and urgency.  Musculoskeletal: Negative for back pain, gait problem and myalgias. Arthralgias: typical arthritis.  Skin: Positive for pallor and wound. Negative for color change and rash.  Neurological: Negative for dizziness, tremors, syncope, speech difficulty, weakness, numbness and headaches.  Psychiatric/Behavioral: Negative for agitation and behavioral problems.  All other systems reviewed and are negative.   Immunization History  Administered Date(s) Administered  . Influenza,inj,Quad PF,36+ Mos 09/07/2015   Pertinent  Health Maintenance Due  Topic Date Due  . COLONOSCOPY  05/06/1998  . PNA vac Low Risk Adult (1 of 2 - PCV13) 05/06/2013  . INFLUENZA VACCINE  06/13/2016  . MAMMOGRAM  08/24/2017  . DEXA SCAN  Completed   Fall Risk  02/21/2016  Falls in the past year? No   Functional Status Survey:    Vitals:   07/10/16 0500  BP: (!) 142/73  Pulse: 84  Resp: 18  Temp: 98 F (36.7 C)   SpO2: 95%  Weight: 184 lb 3.2 oz (83.6 kg)   Body mass index is 33.69 kg/m. Physical Exam  Constitutional: She is oriented to person, place, and time. Vital signs are normal. She appears well-developed and well-nourished. She is active and cooperative. She does not appear ill. No distress.  HENT:  Head: Normocephalic and atraumatic.  Mouth/Throat: Uvula is midline, oropharynx is clear and moist and mucous membranes are normal. Mucous membranes are not pale, not dry and not cyanotic.  Eyes: Conjunctivae, EOM and lids are normal. Pupils are equal, round, and reactive to light.  Neck: Trachea normal, normal range of motion and full passive range of motion without pain. Neck supple. No JVD present. No tracheal deviation, no edema and no erythema present. No thyromegaly present.  Cardiovascular: Regular rhythm, normal heart sounds, intact distal pulses and normal pulses.  Tachycardia present.  Exam reveals no gallop, no distant heart sounds and no friction rub.   No murmur heard. Pulmonary/Chest: Effort normal. No accessory muscle usage. Tachypnea noted. No respiratory distress. She has no decreased breath sounds. She has no wheezes. She has no rhonchi. She has no rales. She exhibits no tenderness.  Abdominal: Soft. Normal appearance and bowel sounds are normal. She exhibits no distension, no pulsatile liver, no fluid wave, no ascites and no pulsatile midline mass. There is no tenderness.  PEG tube in place  Musculoskeletal: Normal range of motion. She exhibits no edema or tenderness.  Expected osteoarthritis, stiffness  Neurological: She is alert and oriented to person, place, and time. She has normal strength.  Skin: Skin is warm, dry and intact. No rash noted. She is not diaphoretic. No cyanosis or erythema. No pallor. Nails show no clubbing.  Decubitus ulcer to ischial.  Psychiatric: She has a normal mood and affect. Her speech is normal and behavior is normal. Judgment and thought content  normal. Cognition and memory are normal.  Nursing note and vitals reviewed.   Labs reviewed:  Recent Labs  06/10/16 0417  06/12/16 0442 06/12/16 1714  06/22/16 0500 06/23/16 0536 07/07/16 1850  NA 150*  < > 148* 147*  < > 136 135 135  K 2.8*  < > 2.8* 3.5  < > 4.1 3.8 3.7  CL 116*  < > 113* 111  < > 97* 97* 88*  CO2 26  < > 30 29  < > 32 31 34*  GLUCOSE 263*  < > 216* 186*  < > 308* 237* 263*  BUN 63*  < >  43* 34*  < > 15 17 39*  CREATININE 0.81  < > 0.38* 0.37*  < > <0.30* <0.30* 0.42*  CALCIUM 8.9  < > 9.2 9.1  < > 9.2 9.2 9.3  MG 1.8  --  1.4* 1.8  --   --   --  1.7  PHOS 2.2*  --  1.4* 2.9  --   --   --   --   < > = values in this interval not displayed.  Recent Labs  06/07/16 2231 06/09/16 0540 07/07/16 1850  AST 25 29 87*  ALT 33 43 133*  ALKPHOS 72 110 155*  BILITOT 0.6 0.7 0.5  PROT 4.5* 5.5* 6.3*  ALBUMIN 1.6* 2.0* 3.0*    Recent Labs  03/10/16 0521  06/07/16 2106  06/22/16 0500 06/25/16 0504 07/07/16 1850  WBC 12.9*  < > 7.8  < > 3.7 6.5 15.4*  NEUTROABS 10.3*  --  6.7*  --   --   --  13.5*  HGB 9.9*  < > 12.7  < > 8.2* 9.0* 10.4*  HCT 31.4*  < > 39.2  < > 23.9* 26.7* 31.1*  MCV 93.3  < > 93.3  < > 93.2 93.1 95.0  PLT 305  < > 182  < > 216 272 287  < > = values in this interval not displayed. Lab Results  Component Value Date   TSH 0.124 (L) 07/07/2016   Lab Results  Component Value Date   HGBA1C 10.5 (H) 06/05/2016   Lab Results  Component Value Date   CHOL 173 07/10/2016   HDL 68 07/10/2016   LDLCALC 60 07/10/2016   TRIG 226 (H) 07/10/2016   CHOLHDL 2.5 07/10/2016    Significant Diagnostic Results in last 30 days:  Dg Chest 1 View  Result Date: 06/12/2016 CLINICAL DATA:  70 year old female with dyspnea. Diabetes. Breast cancer. Sleep apnea. Subsequent encounter. EXAM: CHEST 1 VIEW COMPARISON:  06/11/2016 chest x-ray.  03/08/2016 chest CT. FINDINGS: Portable exam with rotation to the right. Left central line tip unchanged appearing  to project over the level of the proximal superior vena cava. Endotracheal tube tip 3.6 cm above the carina. No gross pneumothorax. Persistent consolidation left lung base possibly with cavitation. The CT detected multi lobar airspace disease is not appreciated on the present plain film exam. If further delineation of lung parenchyma is clinically desired chest CT may be considered. Follow-up until clearance by plain film examination will be necessary to exclude underlying mass. Mild prominence mediastinal and cardiac silhouette is stable. No acute osseous abnormality. IMPRESSION: Persistent consolidation left lung base possibly with cavitation. The 03/08/2016 CT detected multi lobar airspace disease is not appreciated on the present plain film exam. Electronically Signed   By: Genia Del M.D.   On: 06/12/2016 07:51  US Venous Img Upper Uni Left  Result Date: 06/15/2016 CLINICAL DATA:  Left upper extremity edema acutely for 4 days. History of central line and diabetes. EXAM: LEFT UPPER EXTREMITY VENOUS DOPPLER ULTRASOUND TECHNIQUE: Gray-scale sonography with graded compression, as well as color Doppler and duplex ultrasound were performed to evaluate the upper extremity deep venous system from the level of the subclavian vein and including the jugular, axillary, basilic, radial, ulnar and upper cephalic vein. Spectral Doppler was utilized to evaluate flow at rest and with distal augmentation maneuvers. COMPARISON:  None. FINDINGS: Contralateral Subclavian Vein: Respiratory phasicity is normal and symmetric with the symptomatic side. No evidence of thrombus. Normal compressibility. Internal Jugular Vein: No  evidence of thrombus. Normal compressibility, respiratory phasicity and response to augmentation. Subclavian Vein: No evidence of thrombus. Normal compressibility, respiratory phasicity and response to augmentation. Axillary Vein: No evidence of thrombus. Normal compressibility, respiratory phasicity and  response to augmentation. Cephalic Vein: No evidence of thrombus. Normal compressibility, respiratory phasicity and response to augmentation. Basilic Vein: No evidence of thrombus. Normal compressibility, respiratory phasicity and response to augmentation. Brachial Veins: No evidence of thrombus. Normal compressibility, respiratory phasicity and response to augmentation. Radial Veins: No evidence of thrombus. Normal compressibility, respiratory phasicity and response to augmentation. Ulnar Veins: No evidence of thrombus. Normal compressibility, respiratory phasicity and response to augmentation. Venous Reflux:  None visualized. Other Findings:  None visualized. IMPRESSION: No evidence of deep venous thrombosis. Electronically Signed   By: Judie Petit.  Shick M.D.   On: 06/15/2016 16:05   Dg Chest Port 1 View  Result Date: 06/19/2016 CLINICAL DATA:  Respiratory failure. EXAM: PORTABLE CHEST 1 VIEW COMPARISON:  06/15/2016. FINDINGS: Interim removal of left subclavian line. Right PICC line in stable position. Heart size normal. Mild improvement of left base infiltrate. Small left pleural effusion IMPRESSION: 1. Interim removal of left subclavian line. Right PICC line in stable position. 2. Left base atelectasis and/or infiltrate with slight interim improvement. Electronically Signed   By: Maisie Fus  Register   On: 06/19/2016 08:03   Dg Chest Port 1 View  Result Date: 06/15/2016 CLINICAL DATA:  Encounter for central line placement.  PICC line. EXAM: PORTABLE CHEST 1 VIEW COMPARISON:  One-view chest x-ray 06/13/2016 FINDINGS: The heart is mildly enlarged. Mild pulmonary vascular congestion is stable. Atherosclerotic changes are noted at the aortic arch. Left basilar airspace disease has slightly increased. A left subclavian line is stable. A new right-sided PICC line is in place. The tip is at the cavoatrial junction. There is no pneumothorax. The visualized soft tissues and bony thorax are unremarkable. IMPRESSION: 1. Interval  placement of right-sided PICC line without radiographic evidence for complication. 2. Stable left subclavian line. 3. Stable cardiomegaly and mild pulmonary vascular congestion. 4. Progressive left lower lobe airspace disease worrisome for pneumonia or aspiration. 5. Atherosclerosis of the thoracic aorta. Electronically Signed   By: Marin Roberts M.D.   On: 06/15/2016 12:40   Dg Chest Port 1 View  Result Date: 06/13/2016 CLINICAL DATA:  Respiratory failure EXAM: PORTABLE CHEST 1 VIEW COMPARISON:  June 12, 2016 FINDINGS: Endotracheal tube is been removed. Central catheter tip is in the superior vena cava just beyond the junction with the left innominate vein. No pneumothorax. There has been partial clearing of opacity from the left base. The area of questionable cavitation is less apparent compared to 1 day prior. Lungs elsewhere are clear. Heart is upper normal in size with pulmonary vascularity within normal limits. No adenopathy. No bone lesions. IMPRESSION: Central catheter as described. No pneumothorax. Partial but incomplete clearing of infiltrate from the left lower lobe. The area of questionable cavitation in the left lower lobe is less apparent compared to 1 day prior but still suggestive. No new opacity. No change in cardiac silhouette. Electronically Signed   By: Bretta Bang III M.D.   On: 06/13/2016 07:29    Assessment/Plan 1. Generalized abdominal pain  Complete series abdominal and chest xrays were negative  Consider abdominal ultrasound if labs continue to be abnormal and pain continues.   Labs   2. Generalized anxiety disorder  Continue scheduled Clonazepam 0.5 mg po BID  Continue PRN doses of Clonazepam 0.125 mg po BID prn  Continue  Celexa 20 mg po Q Day  [facility] eval today  Severe vitamin B12 deficiency- take 2,ooo mcg po Q day x 2 weeks, then decrease to 1,000 mcg po Q day  Vitamin D deficiency -Cholecalciferol 2,000 units- 2 capsules PO Q Day  x 6 weeks, then decrease to 1 capsule daily  Follow up with PCP for deficiency follow up after discharge.  Family/ staff Communication:   Total Time: 30 minutes  Documentation: 15 minutes  Face to Face: 15 minutes  Family/Phone:   Labs/tests ordered:  cbc, met c,   Medication list reviewed and assessed for continued appropriateness.  Vikki Ports, NP-C Geriatrics [facility] [phone], [place] [address] Cell Phone (Mon-Fri 8am-5pm):  [phone] On Call:  [phone] & follow prompts after 5pm & weekends Office Phone:  [phone] Office Fax:  [phone]

## 2016-07-12 DIAGNOSIS — J449 Chronic obstructive pulmonary disease, unspecified: Secondary | ICD-10-CM | POA: Diagnosis not present

## 2016-07-12 LAB — COMPREHENSIVE METABOLIC PANEL
ALBUMIN: 3.3 g/dL — AB (ref 3.5–5.0)
ALT: 133 U/L — ABNORMAL HIGH (ref 14–54)
AST: 49 U/L — AB (ref 15–41)
Alkaline Phosphatase: 134 U/L — ABNORMAL HIGH (ref 38–126)
Anion gap: 9 (ref 5–15)
BUN: 41 mg/dL — AB (ref 6–20)
CHLORIDE: 92 mmol/L — AB (ref 101–111)
CO2: 37 mmol/L — ABNORMAL HIGH (ref 22–32)
Calcium: 10 mg/dL (ref 8.9–10.3)
Creatinine, Ser: <0.3 mg/dL — ABNORMAL LOW (ref 0.44–1.00)
GLUCOSE: 155 mg/dL — AB (ref 65–99)
POTASSIUM: 3.5 mmol/L (ref 3.5–5.1)
Sodium: 138 mmol/L (ref 135–145)
Total Bilirubin: 0.5 mg/dL (ref 0.3–1.2)
Total Protein: 6.4 g/dL — ABNORMAL LOW (ref 6.5–8.1)

## 2016-07-12 LAB — CBC WITH DIFFERENTIAL/PLATELET
BAND NEUTROPHILS: 7 %
Basophils Absolute: 0 10*3/uL (ref 0–0.1)
Basophils Relative: 0 %
Blasts: 0 %
EOS ABS: 0 10*3/uL (ref 0–0.7)
EOS PCT: 0 %
HCT: 35.2 % (ref 35.0–47.0)
Hemoglobin: 11.6 g/dL — ABNORMAL LOW (ref 12.0–16.0)
LYMPHS ABS: 2.5 10*3/uL (ref 1.0–3.6)
Lymphocytes Relative: 21 %
MCH: 31.9 pg (ref 26.0–34.0)
MCHC: 33 g/dL (ref 32.0–36.0)
MCV: 96.6 fL (ref 80.0–100.0)
METAMYELOCYTES PCT: 0 %
MONO ABS: 1.1 10*3/uL — AB (ref 0.2–0.9)
MONOS PCT: 9 %
Myelocytes: 0 %
NEUTROS ABS: 8.1 10*3/uL — AB (ref 1.4–6.5)
Neutrophils Relative %: 63 %
Other: 0 %
PLATELETS: 324 10*3/uL (ref 150–440)
Promyelocytes Absolute: 0 %
RBC: 3.64 MIL/uL — ABNORMAL LOW (ref 3.80–5.20)
RDW: 20.1 % — AB (ref 11.5–14.5)
WBC: 11.7 10*3/uL — ABNORMAL HIGH (ref 3.6–11.0)
nRBC: 2 /100 WBC — ABNORMAL HIGH

## 2016-07-14 ENCOUNTER — Non-Acute Institutional Stay (SKILLED_NURSING_FACILITY): Payer: Medicare Other | Admitting: Gerontology

## 2016-07-14 ENCOUNTER — Encounter
Admission: RE | Admit: 2016-07-14 | Discharge: 2016-07-14 | Disposition: A | Discharge status: Home / Self Care | Payer: Medicare Other | Source: Ambulatory Visit | Attending: Internal Medicine | Admitting: Internal Medicine

## 2016-07-14 DIAGNOSIS — L8990 Pressure ulcer of unspecified site, unspecified stage: Secondary | ICD-10-CM | POA: Diagnosis not present

## 2016-07-14 DIAGNOSIS — E11628 Type 2 diabetes mellitus with other skin complications: Secondary | ICD-10-CM

## 2016-07-14 DIAGNOSIS — E876 Hypokalemia: Secondary | ICD-10-CM | POA: Diagnosis not present

## 2016-07-14 DIAGNOSIS — R39198 Other difficulties with micturition: Secondary | ICD-10-CM | POA: Diagnosis not present

## 2016-07-14 DIAGNOSIS — J449 Chronic obstructive pulmonary disease, unspecified: Secondary | ICD-10-CM

## 2016-07-14 DIAGNOSIS — E441 Mild protein-calorie malnutrition: Secondary | ICD-10-CM

## 2016-07-14 LAB — URINALYSIS COMPLETE WITH MICROSCOPIC (ARMC ONLY)
Bilirubin Urine: NEGATIVE
Glucose, UA: NEGATIVE mg/dL
Hgb urine dipstick: NEGATIVE
Ketones, ur: NEGATIVE mg/dL
Nitrite: NEGATIVE
PH: 7 (ref 5.0–8.0)
PROTEIN: NEGATIVE mg/dL
Specific Gravity, Urine: 1.011 (ref 1.005–1.030)
Squamous Epithelial / LPF: NONE SEEN

## 2016-07-14 LAB — GLUCOSE, CAPILLARY
GLUCOSE-CAPILLARY: 90 mg/dL (ref 65–99)
GLUCOSE-CAPILLARY: 108 mg/dL — AB (ref 65–99)
GLUCOSE-CAPILLARY: 173 mg/dL — AB (ref 65–99)

## 2016-07-15 DIAGNOSIS — R39198 Other difficulties with micturition: Secondary | ICD-10-CM | POA: Diagnosis not present

## 2016-07-15 LAB — GLUCOSE, CAPILLARY
GLUCOSE-CAPILLARY: 87 mg/dL (ref 65–99)
GLUCOSE-CAPILLARY: 57 mg/dL — AB (ref 65–99)
Glucose-Capillary: 93 mg/dL (ref 65–99)
Glucose-Capillary: 158 mg/dL — ABNORMAL HIGH (ref 65–99)
Glucose-Capillary: 255 mg/dL — ABNORMAL HIGH (ref 65–99)

## 2016-07-16 DIAGNOSIS — R39198 Other difficulties with micturition: Secondary | ICD-10-CM | POA: Diagnosis not present

## 2016-07-16 LAB — GLUCOSE, CAPILLARY
GLUCOSE-CAPILLARY: 125 mg/dL — AB (ref 65–99)
GLUCOSE-CAPILLARY: 194 mg/dL — AB (ref 65–99)
GLUCOSE-CAPILLARY: 90 mg/dL (ref 65–99)
Glucose-Capillary: 185 mg/dL — ABNORMAL HIGH (ref 65–99)
Glucose-Capillary: 110 mg/dL — ABNORMAL HIGH (ref 65–99)
Glucose-Capillary: 88 mg/dL (ref 65–99)

## 2016-07-17 DIAGNOSIS — R39198 Other difficulties with micturition: Secondary | ICD-10-CM | POA: Diagnosis not present

## 2016-07-17 LAB — GLUCOSE, CAPILLARY
GLUCOSE-CAPILLARY: 166 mg/dL — AB (ref 65–99)
GLUCOSE-CAPILLARY: 170 mg/dL — AB (ref 65–99)
Glucose-Capillary: 141 mg/dL — ABNORMAL HIGH (ref 65–99)
Glucose-Capillary: 161 mg/dL — ABNORMAL HIGH (ref 65–99)
Glucose-Capillary: 294 mg/dL — ABNORMAL HIGH (ref 65–99)
Glucose-Capillary: 243 mg/dL — ABNORMAL HIGH (ref 65–99)

## 2016-07-17 LAB — URINE CULTURE

## 2016-07-18 DIAGNOSIS — R39198 Other difficulties with micturition: Secondary | ICD-10-CM | POA: Diagnosis not present

## 2016-07-18 LAB — GLUCOSE, CAPILLARY
GLUCOSE-CAPILLARY: 147 mg/dL — AB (ref 65–99)
GLUCOSE-CAPILLARY: 49 mg/dL — AB (ref 65–99)
GLUCOSE-CAPILLARY: 145 mg/dL — AB (ref 65–99)
GLUCOSE-CAPILLARY: 101 mg/dL — AB (ref 65–99)
GLUCOSE-CAPILLARY: 357 mg/dL — AB (ref 65–99)
GLUCOSE-CAPILLARY: 303 mg/dL — AB (ref 65–99)
Glucose-Capillary: 232 mg/dL — ABNORMAL HIGH (ref 65–99)

## 2016-07-19 DIAGNOSIS — E119 Type 2 diabetes mellitus without complications: Secondary | ICD-10-CM | POA: Insufficient documentation

## 2016-07-19 LAB — GLUCOSE, CAPILLARY
GLUCOSE-CAPILLARY: 127 mg/dL — AB (ref 65–99)
Glucose-Capillary: 220 mg/dL — ABNORMAL HIGH (ref 65–99)
Glucose-Capillary: 237 mg/dL — ABNORMAL HIGH (ref 65–99)
Glucose-Capillary: 143 mg/dL — ABNORMAL HIGH (ref 65–99)
Glucose-Capillary: 74 mg/dL (ref 65–99)
Glucose-Capillary: 83 mg/dL (ref 65–99)

## 2016-07-19 NOTE — Progress Notes (Signed)
Location:      Place of Service:  SNF (31) Provider:  Toni Arthurs, NP-C  Renee Rival, NP  Patient Care Team: Renee Rival, NP as PCP - General (Nurse Practitioner)  Extended Emergency Contact Information Primary Emergency Contact: [name] Address: [address]          [place], [place] [address] [name] of [place] Phone: [phone] Relation: Spouse Secondary Emergency Contact: [name] States of [place] Phone: [phone] Mobile Phone: [phone] Relation: Daughter  Code Status:  full Goals of care: Advanced Directive information Advanced Directives 06/07/2016  Does patient have an advance directive? No  Would patient like information on creating an advanced directive? No - patient declined information     Chief Complaint  Patient presents with  . Acute Visit  . Wound Check    HPI:  Pt is a 70 y.o. 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 She reports that she is feeling better, she is not coughing or having any breathing difficulties. Continues on the Breo ellipta inhaler with PRN albuterol nebulizers and Symbicort twice-daily. She is on oxygen at 2 liters via nasal cannula continuous. Her blood sugars have been elevated. Attempting to manage by adjusting Levemir every couple of days. She has a large stage 3 pressure wound to the sacrum with slough, necrotic tissue, mild drainage, and is Mal odorous. She is incontinent of urine, causing the wound to be contaminated with urine and some stool. She does also have an element of malnutrition despite  her obesity causing an increased delay in healing of the sacral wound. She is currently on supplemental tube feedings as well as bid prostat, she is also on an air mattress. Sacral wound is currently  being treated with santyl ointment and a damp to dry dressing once-daily   Past Medical History:  Diagnosis Date  . Asthma   . Breast cancer ([place])   . Carpal tunnel syndrome   . COPD (chronic obstructive pulmonary disease) ([place])   . Depression   . Diabetes mellitus type 2, uncomplicated ([place])   . Hyperlipidemia   . Hypertension   . Hypothyroidism   . Osteoporosis, post-menopausal   . Psoriasis   . Sleep apnea   . Vitamin D deficiency    Past Surgical History:  Procedure Laterality Date  . BREAST EXCISIONAL BIOPSY Left 10/06/2015   np for surgery lumpectomy  . BREAST LUMPECTOMY WITH NEEDLE LOCALIZATION Left 10/06/2015   Procedure: BREAST LUMPECTOMY WITH NEEDLE LOCALIZATION;  Surgeon: Hubbard Robinson, MD;  Location: [facility] ORS;  Service: General;  Laterality: Left;  . carpal tunnel Right 1995  . Right heel spur removed    . SENTINEL NODE BIOPSY Left 10/06/2015   Procedure: SENTINEL NODE BIOPSY;  Surgeon: Hubbard Robinson, MD;  Location: [facility] ORS;  Service: General;  Laterality: Left;  . THUMB ARTHROSCOPY Left 2000   release of a nerve to the thumb.  . TRACHEOSTOMY TUBE PLACEMENT N/A 03/14/2016   Procedure: TRACHEOSTOMY;  Surgeon: Carloyn Manner, MD;  Location: [facility] ORS;  Service: ENT;  Laterality: N/A;  . TUBAL LIGATION      Allergies  Allergen Reactions  . Other Hives and Rash    Shellfish. Shellfish Pt reports no allergic reaction to iodine or betadine.  . Shellfish Allergy Hives and Rash  Medication List       Accurate as of 07/14/16 11:59 PM. Always use your most recent med list.          aspirin EC 81 MG tablet Take 81 mg by mouth every morning.   BREO ELLIPTA 100-25 MCG/INH Aepb Generic drug:  fluticasone furoate-vilanterol Inhale 2 puffs into the lungs daily.   buPROPion 150 MG 24 hr tablet Commonly known as:  WELLBUTRIN XL Take 150 mg by mouth every morning.   clobetasol 0.05 % external solution Commonly known as:  TEMOVATE Apply 1  application topically daily as needed (for psoriasis.).   clonazePAM 0.25 MG disintegrating tablet Commonly known as:  KLONOPIN Take 1 tablet by mouth every 12 (twelve) hours.   dexamethasone 4 MG tablet Commonly known as:  DECADRON Take 4 mg by mouth daily.   escitalopram 20 MG tablet Commonly known as:  LEXAPRO Take 20 mg by mouth daily.   famotidine 40 MG/5ML suspension Commonly known as:  PEPCID Place 2.5 mLs (20 mg total) into feeding tube daily.   feeding supplement (OSMOLITE 1.5 CAL) Liqd Place 1,000 mLs into feeding tube continuous.   free water Soln Place 200 mLs into feeding tube every 8 (eight) hours.   furosemide 20 MG tablet Commonly known as:  LASIX Take 20 mg by mouth 2 (two) times daily.   gabapentin 100 MG capsule Commonly known as:  NEURONTIN Take 100 mg by mouth at bedtime.   glipiZIDE 5 MG tablet Commonly known as:  GLUCOTROL Take 1 tablet by mouth daily before lunch.   HUMALOG 100 UNIT/ML injection Generic drug:  insulin lispro   HUMIRA PEN-PSORIASIS STARTER 40 MG/0.8ML Pnkt Generic drug:  Adalimumab Inject 40 mg into the skin every 14 (fourteen) days.   insulin aspart 100 UNIT/ML injection Commonly known as:  novoLOG Inject 4 Units into the skin every 4 (four) hours.   insulin detemir 100 UNIT/ML injection Commonly known as:  LEVEMIR Inject 40 Units into the skin at bedtime.   INVOKANA 100 MG Tabs tablet Generic drug:  canagliflozin   letrozole 2.5 MG tablet Commonly known as:  FEMARA Take 2.5 mg by mouth daily.   levothyroxine 200 MCG tablet Commonly known as:  SYNTHROID, LEVOTHROID Take 200 mcg by mouth daily before breakfast. *Take along with 50 mcg to equal 250 mcg total dose*   levothyroxine 50 MCG tablet Commonly known as:  SYNTHROID, LEVOTHROID Take 50 mcg by mouth daily before breakfast. *Take along with 200 mcg tablet to equal 250 mcg total dose.*   losartan-hydrochlorothiazide 100-25 MG tablet Commonly known as:   HYZAAR Take 1 tablet by mouth daily. In am.   metoprolol 50 MG tablet Commonly known as:  LOPRESSOR Take 50 mg by mouth 2 (two) times daily.   morphine CONCENTRATE 10 MG/0.5ML Soln concentrated solution Take 0.5-1 mLs (10-20 mg total) by mouth every hour as needed for severe pain.   albuterol (2.5 MG/3ML) 0.083% nebulizer solution Commonly known as:  PROVENTIL Inhale 1 vial using nebulizer every six hours as needed for shortness of breath and/or wheezing.   PROAIR HFA 108 (90 Base) MCG/ACT inhaler Generic drug:  albuterol INHALE 2 PUFFS BY MOUTH EVERY 4 HOURS AS NEEDED FOR SHORTNESS OF BREATH AND/OR WHEEZING.   simvastatin 40 MG tablet Commonly known as:  ZOCOR Take 40 mg by mouth daily at 6 PM.   SPIRIVA HANDIHALER 18 MCG inhalation capsule Generic drug:  tiotropium   SYMBICORT 160-4.5 MCG/ACT inhaler Generic drug:  budesonide-formoterol INHALE 2 PUFFS  BY MOUTH TWICE DAILY       Review of Systems  Constitutional: Positive for activity change and appetite change. Negative for chills, diaphoresis and fever.  HENT: Negative for congestion, sneezing, sore throat, trouble swallowing and voice change.   Eyes: Negative for pain, redness and visual disturbance.  Respiratory: Negative for apnea, cough, choking, chest tightness, shortness of breath and wheezing.   Cardiovascular: Negative for chest pain, palpitations and leg swelling.  Gastrointestinal: Negative for abdominal distention, abdominal pain, constipation, diarrhea, nausea and vomiting.  Genitourinary: Negative for difficulty urinating, dysuria, frequency and urgency.  Musculoskeletal: Negative for back pain, gait problem and myalgias. Arthralgias: typical arthritis.  Skin: Positive for pallor and wound. Negative for color change and rash.  Neurological: Negative for dizziness, tremors, syncope, speech difficulty, weakness, numbness and headaches.  Psychiatric/Behavioral: Negative for agitation and behavioral problems.    All other systems reviewed and are negative.   Immunization History  Administered Date(s) Administered  . Influenza,inj,Quad PF,36+ Mos 09/07/2015   Pertinent  Health Maintenance Due  Topic Date Due  . FOOT EXAM  05/06/1958  . OPHTHALMOLOGY EXAM  05/06/1958  . COLONOSCOPY  05/06/1998  . PNA vac Low Risk Adult (1 of 2 - PCV13) 05/06/2013  . INFLUENZA VACCINE  06/13/2016  . HEMOGLOBIN A1C  12/06/2016  . MAMMOGRAM  08/24/2017  . DEXA SCAN  Completed   Fall Risk  02/21/2016  Falls in the past year? No   Functional Status Survey:    There were no vitals filed for this visit. There is no height or weight on file to calculate BMI. Physical Exam  Constitutional: She is oriented to person, place, and time. Vital signs are normal. She appears well-developed and well-nourished. She is active and cooperative. She does not appear ill. No distress.  HENT:  Head: Normocephalic and atraumatic.  Mouth/Throat: Uvula is midline, oropharynx is clear and moist and mucous membranes are normal. Mucous membranes are not pale, not dry and not cyanotic.  Eyes: Conjunctivae, EOM and lids are normal. Pupils are equal, round, and reactive to light.  Neck: Trachea normal, normal range of motion and full passive range of motion without pain. Neck supple. No JVD present. No tracheal deviation, no edema and no erythema present. No thyromegaly present.  Cardiovascular: Regular rhythm, normal heart sounds, intact distal pulses and normal pulses.  Tachycardia present.  Exam reveals no gallop, no distant heart sounds and no friction rub.   No murmur heard. Pulmonary/Chest: Effort normal. No accessory muscle usage. No tachypnea. No respiratory distress. She has no decreased breath sounds. She has no wheezes. She has no rhonchi. She has no rales. She exhibits no tenderness.  Abdominal: Soft. Normal appearance and bowel sounds are normal. She exhibits no distension, no pulsatile liver, no fluid wave, no ascites and no  pulsatile midline mass. There is no tenderness.  PEG tube in place  Musculoskeletal: Normal range of motion. She exhibits no edema or tenderness.  Expected osteoarthritis, stiffness  Neurological: She is alert and oriented to person, place, and time. She has normal strength.  Skin: Skin is warm, dry and intact. No rash noted. She is not diaphoretic. No cyanosis or erythema. No pallor. Nails show no clubbing.  Decubitus ulcer to ischial.  Psychiatric: She has a normal mood and affect. Her speech is normal and behavior is normal. Judgment and thought content normal. Cognition and memory are normal.  Nursing note and vitals reviewed.   Labs reviewed:  Recent Labs  06/10/16 0417  06/12/16  [id number] 06/12/16 1714  06/23/16 0536 07/07/16 1850 07/12/16 1109  NA 150*  < > 148* 147*  < > 135 135 138  K 2.8*  < > 2.8* 3.5  < > 3.8 3.7 3.5  CL 116*  < > 113* 111  < > 97* 88* 92*  CO2 26  < > 30 29  < > 31 34* 37*  GLUCOSE 263*  < > 216* 186*  < > 237* 263* 155*  BUN 63*  < > 43* 34*  < > 17 39* 41*  CREATININE 0.81  < > 0.38* 0.37*  < > <0.30* 0.42* <0.30*  CALCIUM 8.9  < > 9.2 9.1  < > 9.2 9.3 10.0  MG 1.8  --  1.4* 1.8  --   --  1.7  --   PHOS 2.2*  --  1.4* 2.9  --   --   --   --   < > = values in this interval not displayed.  Recent Labs  06/09/16 0540 07/07/16 1850 07/12/16 1109  AST 29 87* 49*  ALT 43 133* 133*  ALKPHOS 110 155* 134*  BILITOT 0.7 0.5 0.5  PROT 5.5* 6.3* 6.4*  ALBUMIN 2.0* 3.0* 3.3*    Recent Labs  06/07/16 2106  06/25/16 0504 07/07/16 1850 07/12/16 1109  WBC 7.8  < > 6.5 15.4* 11.7*  NEUTROABS 6.7*  --   --  13.5* 8.1*  HGB 12.7  < > 9.0* 10.4* 11.6*  HCT 39.2  < > 26.7* 31.1* 35.2  MCV 93.3  < > 93.1 95.0 96.6  PLT 182  < > 272 287 324  < > = values in this interval not displayed. Lab Results  Component Value Date   TSH 0.124 (L) 07/07/2016   Lab Results  Component Value Date   HGBA1C 10.5 (H) 06/05/2016   Lab Results  Component Value Date     CHOL 173 07/10/2016   HDL 68 07/10/2016   LDLCALC 60 07/10/2016   TRIG 226 (H) 07/10/2016   CHOLHDL 2.5 07/10/2016    Significant Diagnostic Results in last 30 days:  No results found.  Assessment/Plan 1. Pressure ulcer  Sharp instrument debridement of sacral wound. Debrided moderate amount of necrotic, malodorous tissue. Small amount of intermittent light bleeding when in contact with healthy tissue.   Patient gave verbal consent for the debridement. Cleaned afterwards with normal saline.   Wound bed pink and moist with healthy tissue.  Continue [place] ointment with moist to dry dressing daily.  Reevaluate wound next week. Will likely apply wound vac  2. Mild chronic obstructive pulmonary disease (HCC)  Continue scheduled breathing treatments and oxygen.   Encourage incentive spirometer  3. Type 2 diabetes mellitus with other skin complication, unspecified long term insulin use status ([place]) See malnutrition entries  4. Mild malnutrition ([place])  Continue nutritional supplements via tube.  Encourage Variety in her diet.  Also try to main a consistent calorie diet with no excess sugars to Aid in control of her fasting blood glucose in relation to her diabetes for wound healing.    Family/ staff Communication:   Total Time:  Documentation:  Face to Face:  Family/Phone:   Labs/tests ordered:  UA, C&S  Medication list reviewed and assessed for continued appropriateness.  Vikki Ports, NP-C Geriatrics [facility] [phone], [place] [address] Cell Phone (Mon-Fri 8am-5pm):  [phone] On Call:  [phone] & follow prompts after 5pm & weekends  Office Phone:  [phone] Office Fax:  [phone]

## 2016-07-20 ENCOUNTER — Non-Acute Institutional Stay (SKILLED_NURSING_FACILITY): Payer: Medicare Other | Admitting: Gerontology

## 2016-07-20 DIAGNOSIS — N390 Urinary tract infection, site not specified: Secondary | ICD-10-CM

## 2016-07-20 DIAGNOSIS — L8990 Pressure ulcer of unspecified site, unspecified stage: Secondary | ICD-10-CM | POA: Diagnosis not present

## 2016-07-20 DIAGNOSIS — J449 Chronic obstructive pulmonary disease, unspecified: Secondary | ICD-10-CM

## 2016-07-20 DIAGNOSIS — R39198 Other difficulties with micturition: Secondary | ICD-10-CM | POA: Diagnosis not present

## 2016-07-20 LAB — GLUCOSE, CAPILLARY
GLUCOSE-CAPILLARY: 55 mg/dL — AB (ref 65–99)
GLUCOSE-CAPILLARY: 44 mg/dL — AB (ref 65–99)
GLUCOSE-CAPILLARY: 99 mg/dL (ref 65–99)
Glucose-Capillary: 39 mg/dL — CL (ref 65–99)
Glucose-Capillary: 54 mg/dL — ABNORMAL LOW (ref 65–99)
Glucose-Capillary: 66 mg/dL (ref 65–99)

## 2016-07-21 DIAGNOSIS — R39198 Other difficulties with micturition: Secondary | ICD-10-CM | POA: Diagnosis not present

## 2016-07-21 LAB — GLUCOSE, CAPILLARY
GLUCOSE-CAPILLARY: 52 mg/dL — AB (ref 65–99)
GLUCOSE-CAPILLARY: 104 mg/dL — AB (ref 65–99)
GLUCOSE-CAPILLARY: 210 mg/dL — AB (ref 65–99)
Glucose-Capillary: 51 mg/dL — ABNORMAL LOW (ref 65–99)
Glucose-Capillary: 85 mg/dL (ref 65–99)
Glucose-Capillary: 51 mg/dL — ABNORMAL LOW (ref 65–99)
Glucose-Capillary: 100 mg/dL — ABNORMAL HIGH (ref 65–99)

## 2016-07-22 LAB — GLUCOSE, CAPILLARY
GLUCOSE-CAPILLARY: 256 mg/dL — AB (ref 65–99)
Glucose-Capillary: 391 mg/dL — ABNORMAL HIGH (ref 65–99)
Glucose-Capillary: 124 mg/dL — ABNORMAL HIGH (ref 65–99)
Glucose-Capillary: 210 mg/dL — ABNORMAL HIGH (ref 65–99)

## 2016-07-23 DIAGNOSIS — J440 Chronic obstructive pulmonary disease with acute lower respiratory infection: Secondary | ICD-10-CM | POA: Insufficient documentation

## 2016-07-23 DIAGNOSIS — R39198 Other difficulties with micturition: Secondary | ICD-10-CM | POA: Diagnosis not present

## 2016-07-23 LAB — GLUCOSE, CAPILLARY
GLUCOSE-CAPILLARY: 50 mg/dL — AB (ref 65–99)
GLUCOSE-CAPILLARY: 114 mg/dL — AB (ref 65–99)
Glucose-Capillary: 56 mg/dL — ABNORMAL LOW (ref 65–99)
Glucose-Capillary: 80 mg/dL (ref 65–99)
Glucose-Capillary: 64 mg/dL — ABNORMAL LOW (ref 65–99)

## 2016-07-23 NOTE — Progress Notes (Signed)
Location:      Place of Service:  SNF (31) Provider:  Toni Arthurs, NP-C  Renee Rival, NP  Patient Care Team: Renee Rival, NP as PCP - General (Nurse Practitioner)  Extended Emergency Contact Information Primary Emergency Contact: [name] Address: [address]          [place], [place] [address] [name] of [place] Phone: [phone] Relation: Spouse Secondary Emergency Contact: [name] States of [place] Phone: [phone] Mobile Phone: [phone] Relation: Daughter  Code Status:  dnr Goals of care:  Advanced Directive information Advanced Directives 06/07/2016  Does patient have an advance directive? No  Would patient like information on creating an advanced directive? No - patient declined information     Chief Complaint  Patient presents with  . Wound Check    HPI:  Pt is a 70 y.o. female seen today for an acute visit for Was seen today for a follow-up wound check. She underwent sharp debridement of sacral wound on Friday. She was getting santyl and damp to dry dressings changed daily over the weekend. Today wound bed is pink, moist, without drainage. Minimal odor. Measures 7 cm by 9 cm by 1 cm deep. Plan is to place and maintain wound vac. Also of note, urine was sent off to the lab after placement of Foley for urinary incontinence and wound contamination last week. Urine culture grew esbl e-coli in the urine. Patient was started on IM invanz x 2 days then changed to IV invanz for 5 more days. Urine will be rechecked at completion of antibiotics to assess for eradication of bacteria. Patient reports she also has had a cough for approximately 3 weeks. Productive at times with yellow thick sputum. Patient has been a febrile. No chest pain, no shortness of breath. Patient is already on oxygen 2L via nasal cannula. Vital signs stable. No other complaints.    Past Medical History:  Diagnosis Date  . Asthma   .  Breast cancer ([place])   . Carpal tunnel syndrome   . COPD (chronic obstructive pulmonary disease) ([place])   . Depression   . Diabetes mellitus type 2, uncomplicated ([place])   . Hyperlipidemia   . Hypertension   . Hypothyroidism   . Osteoporosis, post-menopausal   . Psoriasis   . Sleep apnea   . Vitamin D deficiency    Past Surgical History:  Procedure Laterality Date  . BREAST EXCISIONAL BIOPSY Left 10/06/2015   np for surgery lumpectomy  . BREAST LUMPECTOMY WITH NEEDLE LOCALIZATION Left 10/06/2015   Procedure: BREAST LUMPECTOMY WITH NEEDLE LOCALIZATION;  Surgeon: Hubbard Robinson, MD;  Location: [facility] ORS;  Service: General;  Laterality: Left;  . carpal tunnel Right 1995  . Right heel spur removed    . SENTINEL NODE BIOPSY Left 10/06/2015   Procedure: SENTINEL NODE BIOPSY;  Surgeon: Hubbard Robinson, MD;  Location: [facility] ORS;  Service: General;  Laterality: Left;  . THUMB ARTHROSCOPY Left 2000   release of a nerve to the thumb.  . TRACHEOSTOMY TUBE PLACEMENT N/A 03/14/2016   Procedure: TRACHEOSTOMY;  Surgeon: Carloyn Manner, MD;  Location: [facility] ORS;  Service: ENT;  Laterality: N/A;  . TUBAL LIGATION      Allergies  Allergen Reactions  . Other Hives and Rash    Shellfish. Shellfish Pt reports no allergic reaction to iodine or betadine.  . Shellfish Allergy Hives and Rash      Medication List       Accurate as of  07/20/16 11:59 PM. Always use your most recent med list.          aspirin EC 81 MG tablet Take 81 mg by mouth every morning.   BREO ELLIPTA 100-25 MCG/INH Aepb Generic drug:  fluticasone furoate-vilanterol Inhale 2 puffs into the lungs daily.   buPROPion 150 MG 24 hr tablet Commonly known as:  WELLBUTRIN XL Take 150 mg by mouth every morning.   clobetasol 0.05 % external solution Commonly known as:  TEMOVATE Apply 1 application topically daily as needed (for psoriasis.).   clonazePAM 0.25 MG disintegrating tablet Commonly known as:  KLONOPIN Take 1  tablet by mouth every 12 (twelve) hours.   dexamethasone 4 MG tablet Commonly known as:  DECADRON Take 4 mg by mouth daily.   escitalopram 20 MG tablet Commonly known as:  LEXAPRO Take 20 mg by mouth daily.   famotidine 40 MG/5ML suspension Commonly known as:  PEPCID Place 2.5 mLs (20 mg total) into feeding tube daily.   feeding supplement (OSMOLITE 1.5 CAL) Liqd Place 1,000 mLs into feeding tube continuous.   free water Soln Place 200 mLs into feeding tube every 8 (eight) hours.   furosemide 20 MG tablet Commonly known as:  LASIX Take 20 mg by mouth 2 (two) times daily.   gabapentin 100 MG capsule Commonly known as:  NEURONTIN Take 100 mg by mouth at bedtime.   glipiZIDE 5 MG tablet Commonly known as:  GLUCOTROL Take 1 tablet by mouth daily before lunch.   HUMALOG 100 UNIT/ML injection Generic drug:  insulin lispro   HUMIRA PEN-PSORIASIS STARTER 40 MG/0.8ML Pnkt Generic drug:  Adalimumab Inject 40 mg into the skin every 14 (fourteen) days.   insulin aspart 100 UNIT/ML injection Commonly known as:  novoLOG Inject 4 Units into the skin every 4 (four) hours.   insulin detemir 100 UNIT/ML injection Commonly known as:  LEVEMIR Inject 40 Units into the skin at bedtime.   INVOKANA 100 MG Tabs tablet Generic drug:  canagliflozin   letrozole 2.5 MG tablet Commonly known as:  FEMARA Take 2.5 mg by mouth daily.   levothyroxine 200 MCG tablet Commonly known as:  SYNTHROID, LEVOTHROID Take 200 mcg by mouth daily before breakfast. *Take along with 50 mcg to equal 250 mcg total dose*   levothyroxine 50 MCG tablet Commonly known as:  SYNTHROID, LEVOTHROID Take 50 mcg by mouth daily before breakfast. *Take along with 200 mcg tablet to equal 250 mcg total dose.*   losartan-hydrochlorothiazide 100-25 MG tablet Commonly known as:  HYZAAR Take 1 tablet by mouth daily. In am.   metoprolol 50 MG tablet Commonly known as:  LOPRESSOR Take 50 mg by mouth 2 (two) times  daily.   morphine CONCENTRATE 10 MG/0.5ML Soln concentrated solution Take 0.5-1 mLs (10-20 mg total) by mouth every hour as needed for severe pain.   albuterol (2.5 MG/3ML) 0.083% nebulizer solution Commonly known as:  PROVENTIL Inhale 1 vial using nebulizer every six hours as needed for shortness of breath and/or wheezing.   PROAIR HFA 108 (90 Base) MCG/ACT inhaler Generic drug:  albuterol INHALE 2 PUFFS BY MOUTH EVERY 4 HOURS AS NEEDED FOR SHORTNESS OF BREATH AND/OR WHEEZING.   simvastatin 40 MG tablet Commonly known as:  ZOCOR Take 40 mg by mouth daily at 6 PM.   SPIRIVA HANDIHALER 18 MCG inhalation capsule Generic drug:  tiotropium   SYMBICORT 160-4.5 MCG/ACT inhaler Generic drug:  budesonide-formoterol INHALE 2 PUFFS BY MOUTH TWICE DAILY       Review  of Systems  Immunization History  Administered Date(s) Administered  . Influenza,inj,Quad PF,36+ Mos 09/07/2015   Pertinent  Health Maintenance Due  Topic Date Due  . FOOT EXAM  05/06/1956  . OPHTHALMOLOGY EXAM  05[birth year]06/1958  . COLONOSCOPY  05/06/1998  . PNA vac Low Risk Adult (1 of 2 - PCV13) 05/06/2013  . INFLUENZA VACCINE  06/13/2016  . HEMOGLOBIN A1C  12/06/2016  . MAMMOGRAM  08/24/2017  . DEXA SCAN  Completed   Fall Risk  02/21/2016  Falls in the past year? No   Functional Status Survey:    Vitals:   07/20/16 0500  BP: (!) 93/56  Pulse: (!) 54  Resp: 18  Temp: 97.3 F (36.3 C)  SpO2: 95%   There is no height or weight on file to calculate BMI. Physical Exam  Labs reviewed:  Recent Labs  06/10/16 0417  06/12/16 0442 06/12/16 1714  06/23/16 0536 07/07/16 1850 07/12/16 1109  NA 150*  < > 148* 147*  < > 135 135 138  K 2.8*  < > 2.8* 3.5  < > 3.8 3.7 3.5  CL 116*  < > 113* 111  < > 97* 88* 92*  CO2 26  < > 30 29  < > 31 34* 37*  GLUCOSE 263*  < > 216* 186*  < > 237* 263* 155*  BUN 63*  < > 43* 34*  < > 17 39* 41*  CREATININE 0.81  < > 0.38* 0.37*  < > <0.30* 0.42* <0.30*  CALCIUM 8.9  < >  9.2 9.1  < > 9.2 9.3 10.0  MG 1.8  --  1.4* 1.8  --   --  1.7  --   PHOS 2.2*  --  1.4* 2.9  --   --   --   --   < > = values in this interval not displayed.  Recent Labs  06/09/16 0540 07/07/16 1850 07/12/16 1109  AST 29 87* 49*  ALT 43 133* 133*  ALKPHOS 110 155* 134*  BILITOT 0.7 0.5 0.5  PROT 5.5* 6.3* 6.4*  ALBUMIN 2.0* 3.0* 3.3*    Recent Labs  06/07/16 2106  06/25/16 0504 07/07/16 1850 07/12/16 1109  WBC 7.8  < > 6.5 15.4* 11.7*  NEUTROABS 6.7*  --   --  13.5* 8.1*  HGB 12.7  < > 9.0* 10.4* 11.6*  HCT 39.2  < > 26.7* 31.1* 35.2  MCV 93.3  < > 93.1 95.0 96.6  PLT 182  < > 272 287 324  < > = values in this interval not displayed. Lab Results  Component Value Date   TSH 0.124 (L) 07/07/2016   Lab Results  Component Value Date   HGBA1C 10.5 (H) 06/05/2016   Lab Results  Component Value Date   CHOL 173 07/10/2016   HDL 68 07/10/2016   LDLCALC 60 07/10/2016   TRIG 226 (H) 07/10/2016   CHOLHDL 2.5 07/10/2016    Significant Diagnostic Results in last 30 days:  No results found.  Assessment/Plan 1. Pressure ulcer  DC santyl dressing.   wound VAC to sacrum set at 125  mmhg continuous.   Change wound vac on Tuesdays, Thursdays, and Saturdays.   Assess area carefully, frequently due to potential for stool contamination due to location  Continue jevity bolus feeds, and prostat bid for nutritional support.  2. Urinary tract infection, site not specified  Continue invanz one gram IV Q day x 5 more days.  Maintain peripheral IV site.  Change as needed and Q4 days  3. Mild Chronic Obstructive Pulmonary Disease  Schedule guaifenesin 53ml po qid x 3 days,   then Q 4 hrs PRN for congestion  Family/ staff Communication:   Total Time:  Documentation:  Face to Face:  Family/Phone:   Labs/tests ordered:    Medication list reviewed and assessed for continued appropriateness.  Vikki Ports, NP-C Geriatrics [facility] [phone], [place] [address] Cell Phone (Mon-Fri 8am-5pm):  [phone] On Call:  [phone] & follow prompts after 5pm & weekends Office Phone:  [phone] Office Fax:  [phone]

## 2016-07-24 DIAGNOSIS — R39198 Other difficulties with micturition: Secondary | ICD-10-CM | POA: Diagnosis not present

## 2016-07-24 LAB — GLUCOSE, CAPILLARY: Glucose-Capillary: 145 mg/dL — ABNORMAL HIGH (ref 65–99)

## 2016-07-25 LAB — GLUCOSE, CAPILLARY
GLUCOSE-CAPILLARY: 212 mg/dL — AB (ref 65–99)
GLUCOSE-CAPILLARY: 108 mg/dL — AB (ref 65–99)
GLUCOSE-CAPILLARY: 200 mg/dL — AB (ref 65–99)
Glucose-Capillary: 179 mg/dL — ABNORMAL HIGH (ref 65–99)
Glucose-Capillary: 46 mg/dL — ABNORMAL LOW (ref 65–99)
Glucose-Capillary: 60 mg/dL — ABNORMAL LOW (ref 65–99)

## 2016-07-26 DIAGNOSIS — R39198 Other difficulties with micturition: Secondary | ICD-10-CM | POA: Diagnosis not present

## 2016-07-26 LAB — URINALYSIS COMPLETE WITH MICROSCOPIC (ARMC ONLY)
BACTERIA UA: NONE SEEN
Bilirubin Urine: NEGATIVE
GLUCOSE, UA: NEGATIVE mg/dL
HGB URINE DIPSTICK: NEGATIVE
Ketones, ur: NEGATIVE mg/dL
LEUKOCYTES UA: NEGATIVE
Nitrite: NEGATIVE
PH: 5 (ref 5.0–8.0)
Protein, ur: NEGATIVE mg/dL
SPECIFIC GRAVITY, URINE: 1.017 (ref 1.005–1.030)
SQUAMOUS EPITHELIAL / LPF: NONE SEEN

## 2016-07-26 LAB — GLUCOSE, CAPILLARY
GLUCOSE-CAPILLARY: 50 mg/dL — AB (ref 65–99)
GLUCOSE-CAPILLARY: 45 mg/dL — AB (ref 65–99)
GLUCOSE-CAPILLARY: 64 mg/dL — AB (ref 65–99)
GLUCOSE-CAPILLARY: 93 mg/dL (ref 65–99)
GLUCOSE-CAPILLARY: 74 mg/dL (ref 65–99)
Glucose-Capillary: 109 mg/dL — ABNORMAL HIGH (ref 65–99)
Glucose-Capillary: 38 mg/dL — CL (ref 65–99)
Glucose-Capillary: 40 mg/dL — CL (ref 65–99)
Glucose-Capillary: 79 mg/dL (ref 65–99)

## 2016-07-27 ENCOUNTER — Non-Acute Institutional Stay (SKILLED_NURSING_FACILITY): Payer: Medicare Other | Admitting: Gerontology

## 2016-07-27 DIAGNOSIS — F329 Major depressive disorder, single episode, unspecified: Secondary | ICD-10-CM | POA: Diagnosis not present

## 2016-07-27 DIAGNOSIS — E15 Nondiabetic hypoglycemic coma: Secondary | ICD-10-CM

## 2016-07-27 DIAGNOSIS — F32A Depression, unspecified: Secondary | ICD-10-CM

## 2016-07-27 LAB — URINE CULTURE: CULTURE: NO GROWTH

## 2016-07-31 ENCOUNTER — Non-Acute Institutional Stay (SKILLED_NURSING_FACILITY): Payer: Medicare Other | Admitting: Gerontology

## 2016-07-31 ENCOUNTER — Other Ambulatory Visit
Admission: RE | Admit: 2016-07-31 | Discharge: 2016-07-31 | Disposition: A | Discharge status: Home / Self Care | Source: Ambulatory Visit | Attending: Gerontology | Admitting: Gerontology

## 2016-07-31 DIAGNOSIS — R0989 Other specified symptoms and signs involving the circulatory and respiratory systems: Secondary | ICD-10-CM | POA: Diagnosis present

## 2016-07-31 DIAGNOSIS — R0600 Dyspnea, unspecified: Secondary | ICD-10-CM | POA: Insufficient documentation

## 2016-07-31 DIAGNOSIS — R4182 Altered mental status, unspecified: Secondary | ICD-10-CM | POA: Diagnosis not present

## 2016-07-31 DIAGNOSIS — F329 Major depressive disorder, single episode, unspecified: Secondary | ICD-10-CM | POA: Insufficient documentation

## 2016-07-31 DIAGNOSIS — J9600 Acute respiratory failure, unspecified whether with hypoxia or hypercapnia: Secondary | ICD-10-CM | POA: Diagnosis not present

## 2016-07-31 DIAGNOSIS — F32A Depression, unspecified: Secondary | ICD-10-CM | POA: Insufficient documentation

## 2016-07-31 DIAGNOSIS — E15 Nondiabetic hypoglycemic coma: Secondary | ICD-10-CM | POA: Insufficient documentation

## 2016-07-31 LAB — CBC WITH DIFFERENTIAL/PLATELET
Basophils Absolute: 0 10*3/uL (ref 0–0.1)
Basophils Relative: 0 %
Eosinophils Absolute: 0 10*3/uL (ref 0–0.7)
Eosinophils Relative: 0 %
HEMATOCRIT: 33.5 % — AB (ref 35.0–47.0)
Hemoglobin: 11.2 g/dL — ABNORMAL LOW (ref 12.0–16.0)
LYMPHS PCT: 10 %
Lymphs Abs: 1.1 10*3/uL (ref 1.0–3.6)
MCH: 32.2 pg (ref 26.0–34.0)
MCHC: 33.4 g/dL (ref 32.0–36.0)
MCV: 96.3 fL (ref 80.0–100.0)
MONO ABS: 0.5 10*3/uL (ref 0.2–0.9)
MONOS PCT: 5 %
NEUTROS ABS: 9.4 10*3/uL — AB (ref 1.4–6.5)
Neutrophils Relative %: 85 %
Platelets: 238 10*3/uL (ref 150–440)
RBC: 3.48 MIL/uL — ABNORMAL LOW (ref 3.80–5.20)
RDW: 18.1 % — AB (ref 11.5–14.5)
WBC: 11 10*3/uL (ref 3.6–11.0)

## 2016-07-31 LAB — C-REACTIVE PROTEIN: CRP: 0.7 mg/dL (ref <1.0)

## 2016-07-31 LAB — COMPREHENSIVE METABOLIC PANEL
ALBUMIN: 2.8 g/dL — AB (ref 3.5–5.0)
ALT: 83 U/L — ABNORMAL HIGH (ref 14–54)
ANION GAP: 13 (ref 5–15)
AST: 38 U/L (ref 15–41)
Alkaline Phosphatase: 114 U/L (ref 38–126)
BUN: 61 mg/dL — ABNORMAL HIGH (ref 6–20)
CO2: 34 mmol/L — AB (ref 22–32)
Calcium: 10.1 mg/dL (ref 8.9–10.3)
Chloride: 92 mmol/L — ABNORMAL LOW (ref 101–111)
Creatinine, Ser: 0.59 mg/dL (ref 0.44–1.00)
GFR calc Af Amer: >60 mL/min (ref >60)
GFR calc non Af Amer: >60 mL/min (ref >60)
GLUCOSE: 385 mg/dL — AB (ref 65–99)
POTASSIUM: 3.6 mmol/L (ref 3.5–5.1)
SODIUM: 139 mmol/L (ref 135–145)
Total Bilirubin: 0.4 mg/dL (ref 0.3–1.2)
Total Protein: 6.1 g/dL — ABNORMAL LOW (ref 6.5–8.1)

## 2016-07-31 LAB — SEDIMENTATION RATE: Sed Rate: 54 mm/hr — ABNORMAL HIGH (ref 0–30)

## 2016-07-31 NOTE — Progress Notes (Signed)
Location:      Place of Service:  SNF (31) Provider:  Toni Arthurs, NP-C  Renee Rival, NP  Patient Care Team: Renee Rival, NP as PCP - General (Nurse Practitioner)  Extended Emergency Contact Information Primary Emergency Contact: [name] Address: [address]          [place], [place] [address] [name] of [place] Phone: [phone] Relation: Spouse Secondary Emergency Contact: [name] [place] of [place] Phone: [phone] Mobile Phone: [phone] Relation: Daughter  Code Status:  full Goals of care: Advanced Directive information Advanced Directives 06/07/2016  Does patient have an advance directive? No  Would patient like information on creating an advanced directive? No - patient declined information     Chief Complaint  Patient presents with  . Acute Visit    HPI:  Pt is a 70 y.o. female seen today for an acute visit for Hypoglycemia. Was called urgently to room by nurse. Patient was found to have a blood glucose of 15. Patient was mostly unresponsive, clammy to touch. Would open her eyes to sternal rub. Glucagon was not available at the time since patient has PEG tube, over the course of an hour patient was given 3 tubes of glucose gel, 1 can of ensure, and 3 are juice containers. Patient's blood sugar slowly trended upwards. After approximately 1 hour, blood sugar was finally at 127. Patient was agreeable to eat a grilled cheese sandwich, one placed in front of her she refused to eat at. I had a long conversation with patient regarding her lack of by mouth intake. Patient endorsed she was just not interested in eating. When asked [place] if she has given up, patient says "I don't think so." Also, patient does endorse depression, and was open to medication change. Patient was also agreeable to palliative care consult. Aside from blood glucose, vital signs stable.   Past Medical History:  Diagnosis Date  .  Asthma   . Breast cancer ([place])   . Carpal tunnel syndrome   . COPD (chronic obstructive pulmonary disease) ([place])   . Depression   . Diabetes mellitus type 2, uncomplicated ([place])   . Hyperlipidemia   . Hypertension   . Hypothyroidism   . Osteoporosis, post-menopausal   . Psoriasis   . Sleep apnea   . Vitamin D deficiency    Past Surgical History:  Procedure Laterality Date  . BREAST EXCISIONAL BIOPSY Left 10/06/2015   np for surgery lumpectomy  . BREAST LUMPECTOMY WITH NEEDLE LOCALIZATION Left 10/06/2015   Procedure: BREAST LUMPECTOMY WITH NEEDLE LOCALIZATION;  Surgeon: Hubbard Robinson, MD;  Location: [facility] ORS;  Service: General;  Laterality: Left;  . carpal tunnel Right 1995  . Right heel spur removed    . SENTINEL NODE BIOPSY Left 10/06/2015   Procedure: SENTINEL NODE BIOPSY;  Surgeon: Hubbard Robinson, MD;  Location: [facility] ORS;  Service: General;  Laterality: Left;  . THUMB ARTHROSCOPY Left 2000   release of a nerve to the thumb.  . TRACHEOSTOMY TUBE PLACEMENT N/A 03/14/2016   Procedure: TRACHEOSTOMY;  Surgeon: Carloyn Manner, MD;  Location: [facility] ORS;  Service: ENT;  Laterality: N/A;  . TUBAL LIGATION      Allergies  Allergen Reactions  . Other Hives and Rash    Shellfish. Shellfish Pt reports no allergic reaction to iodine or betadine.  . Shellfish Allergy Hives and Rash      Medication List       Accurate as of 07/27/16 11:59 PM.  Always use your most recent med list.          aspirin EC 81 MG tablet Take 81 mg by mouth every morning.   BREO ELLIPTA 100-25 MCG/INH Aepb Generic drug:  fluticasone furoate-vilanterol Inhale 2 puffs into the lungs daily.   buPROPion 150 MG 24 hr tablet Commonly known as:  WELLBUTRIN XL Take 150 mg by mouth every morning.   clobetasol 0.05 % external solution Commonly known as:  TEMOVATE Apply 1 application topically daily as needed (for psoriasis.).   clonazePAM 0.25 MG disintegrating tablet Commonly known as:   KLONOPIN Take 1 tablet by mouth every 12 (twelve) hours.   dexamethasone 4 MG tablet Commonly known as:  DECADRON Take 4 mg by mouth daily.   escitalopram 20 MG tablet Commonly known as:  LEXAPRO Take 20 mg by mouth daily.   famotidine 40 MG/5ML suspension Commonly known as:  PEPCID Place 2.5 mLs (20 mg total) into feeding tube daily.   feeding supplement (OSMOLITE 1.5 CAL) Liqd Place 1,000 mLs into feeding tube continuous.   free water Soln Place 200 mLs into feeding tube every 8 (eight) hours.   furosemide 20 MG tablet Commonly known as:  LASIX Take 20 mg by mouth 2 (two) times daily.   gabapentin 100 MG capsule Commonly known as:  NEURONTIN Take 100 mg by mouth at bedtime.   glipiZIDE 5 MG tablet Commonly known as:  GLUCOTROL Take 1 tablet by mouth daily before lunch.   HUMALOG 100 UNIT/ML injection Generic drug:  insulin lispro   HUMIRA PEN-PSORIASIS STARTER 40 MG/0.8ML Pnkt Generic drug:  Adalimumab Inject 40 mg into the skin every 14 (fourteen) days.   insulin aspart 100 UNIT/ML injection Commonly known as:  novoLOG Inject 4 Units into the skin every 4 (four) hours.   insulin detemir 100 UNIT/ML injection Commonly known as:  LEVEMIR Inject 40 Units into the skin at bedtime.   INVOKANA 100 MG Tabs tablet Generic drug:  canagliflozin   letrozole 2.5 MG tablet Commonly known as:  FEMARA Take 2.5 mg by mouth daily.   levothyroxine 200 MCG tablet Commonly known as:  SYNTHROID, LEVOTHROID Take 200 mcg by mouth daily before breakfast. *Take along with 50 mcg to equal 250 mcg total dose*   levothyroxine 50 MCG tablet Commonly known as:  SYNTHROID, LEVOTHROID Take 50 mcg by mouth daily before breakfast. *Take along with 200 mcg tablet to equal 250 mcg total dose.*   losartan-hydrochlorothiazide 100-25 MG tablet Commonly known as:  HYZAAR Take 1 tablet by mouth daily. In am.   metoprolol 50 MG tablet Commonly known as:  LOPRESSOR Take 50 mg by mouth 2  (two) times daily.   morphine CONCENTRATE 10 MG/0.5ML Soln concentrated solution Take 0.5-1 mLs (10-20 mg total) by mouth every hour as needed for severe pain.   albuterol (2.5 MG/3ML) 0.083% nebulizer solution Commonly known as:  PROVENTIL Inhale 1 vial using nebulizer every six hours as needed for shortness of breath and/or wheezing.   PROAIR HFA 108 (90 Base) MCG/ACT inhaler Generic drug:  albuterol INHALE 2 PUFFS BY MOUTH EVERY 4 HOURS AS NEEDED FOR SHORTNESS OF BREATH AND/OR WHEEZING.   simvastatin 40 MG tablet Commonly known as:  ZOCOR Take 40 mg by mouth daily at 6 PM.   SPIRIVA HANDIHALER 18 MCG inhalation capsule Generic drug:  tiotropium   SYMBICORT 160-4.5 MCG/ACT inhaler Generic drug:  budesonide-formoterol INHALE 2 PUFFS BY MOUTH TWICE DAILY       Review of Systems  Unable to perform ROS: Mental status change  Constitutional: Positive for activity change, appetite change and diaphoresis. Negative for chills and fever.  HENT: Negative for congestion, sneezing, sore throat, trouble swallowing and voice change.   Respiratory: Negative for apnea, cough, choking, chest tightness, shortness of breath and wheezing.   Cardiovascular: Negative for chest pain, palpitations and leg swelling.  Gastrointestinal: Negative for abdominal distention, abdominal pain, constipation, diarrhea and nausea.  Genitourinary: Negative for difficulty urinating, dysuria, frequency and urgency.  Musculoskeletal: Positive for myalgias. Negative for back pain and gait problem. Arthralgias: typical arthritis.  Skin: Negative for color change, pallor, rash and wound.  Neurological: Positive for speech difficulty. Negative for dizziness, tremors, syncope, weakness, numbness and headaches.  Psychiatric/Behavioral: Negative for agitation and behavioral problems.  All other systems reviewed and are negative.   Immunization History  Administered Date(s) Administered  . Influenza,inj,Quad PF,36+ Mos  09/07/2015   Pertinent  Health Maintenance Due  Topic Date Due  . FOOT EXAM  05/06/1958  . OPHTHALMOLOGY EXAM  05/06/1958  . COLONOSCOPY  05/06/1998  . PNA vac Low Risk Adult (1 of 2 - PCV13) 05/06/2013  . INFLUENZA VACCINE  06/13/2016  . HEMOGLOBIN A1C  12/06/2016  . MAMMOGRAM  08/24/2017  . DEXA SCAN  Completed   Fall Risk  02/21/2016  Falls in the past year? No   Functional Status Survey:    Vitals:   07/27/16 1735  Pulse: 60  SpO2: 96%   There is no height or weight on file to calculate BMI. Physical Exam  Constitutional: She is oriented to person, place, and time. Vital signs are normal. She appears well-developed and well-nourished. She is active and cooperative. She does not appear ill. No distress.  HENT:  Head: Normocephalic and atraumatic.  Mouth/Throat: Uvula is midline, oropharynx is clear and moist and mucous membranes are normal. Mucous membranes are not pale, not dry and not cyanotic.  Eyes: Conjunctivae, EOM and lids are normal. Pupils are equal, round, and reactive to light.  Neck: Trachea normal, normal range of motion and full passive range of motion without pain. Neck supple. No JVD present. No tracheal deviation, no edema and no erythema present. No thyromegaly present.  Cardiovascular: Normal rate, regular rhythm, normal heart sounds, intact distal pulses and normal pulses.  Exam reveals no gallop, no distant heart sounds and no friction rub.   No murmur heard. Pulmonary/Chest: Effort normal. No accessory muscle usage. No respiratory distress. She has decreased breath sounds in the right lower field and the left upper field. She has no wheezes. She has rhonchi (slightly coarse) in the right upper field, the right middle field, the right lower field, the left upper field, the left middle field and the left lower field. She has no rales. She exhibits no tenderness.  Abdominal: Normal appearance and bowel sounds are normal. She exhibits no distension and no  ascites. There is no tenderness.  Musculoskeletal: Normal range of motion. She exhibits no edema or tenderness.  Expected osteoarthritis, stiffness  Neurological: She is oriented to person, place, and time. She has normal strength. She is unresponsive.  Skin: Skin is warm and intact. She is diaphoretic. No cyanosis. No pallor. Nails show no clubbing.  Psychiatric: Her speech is normal. Thought content normal. Her affect is blunt. She is withdrawn. Cognition and memory are normal. She expresses inappropriate judgment. She exhibits a depressed mood. She is inattentive.  Nursing note and vitals reviewed.   Labs reviewed:  Recent Labs  06/10/16 0417  06/12/16 0442 06/12/16 1714  07/07/16 1850 07/12/16 1109 07/31/16 1445  NA 150*  < > 148* 147*  < > 135 138 139  K 2.8*  < > 2.8* 3.5  < > 3.7 3.5 3.6  CL 116*  < > 113* 111  < > 88* 92* 92*  CO2 26  < > 30 29  < > 34* 37* 34*  GLUCOSE 263*  < > 216* 186*  < > 263* 155* 385*  BUN 63*  < > 43* 34*  < > 39* 41* 61*  CREATININE 0.81  < > 0.38* 0.37*  < > 0.42* <0.30* 0.59  CALCIUM 8.9  < > 9.2 9.1  < > 9.3 10.0 10.1  MG 1.8  --  1.4* 1.8  --  1.7  --   --   PHOS 2.2*  --  1.4* 2.9  --   --   --   --   < > = values in this interval not displayed.  Recent Labs  07/07/16 1850 07/12/16 1109 07/31/16 1445  AST 87* 49* 38  ALT 133* 133* 83*  ALKPHOS 155* 134* 114  BILITOT 0.5 0.5 0.4  PROT 6.3* 6.4* 6.1*  ALBUMIN 3.0* 3.3* 2.8*    Recent Labs  07/07/16 1850 07/12/16 1109 07/31/16 1445  WBC 15.4* 11.7* 11.0  NEUTROABS 13.5* 8.1* 9.4*  HGB 10.4* 11.6* 11.2*  HCT 31.1* 35.2 33.5*  MCV 95.0 96.6 96.3  PLT 287 324 238   Lab Results  Component Value Date   TSH 0.124 (L) 07/07/2016   Lab Results  Component Value Date   HGBA1C 10.5 (H) 06/05/2016   Lab Results  Component Value Date   CHOL 173 07/10/2016   HDL 68 07/10/2016   LDLCALC 60 07/10/2016   TRIG 226 (H) 07/10/2016   CHOLHDL 2.5 07/10/2016    Significant  Diagnostic Results in last 30 days:  No results found.  Assessment/Plan 1. Hypoglycemic shock ([place])  DC Levemir  Blood sugar checks every 4 hours and when necessary  Dietitian consulted on tube feeding adjustments  Palliative care consult  2. Depression  DC Lexapro  Effexor immediate release 75 mg twice a day, okay to put through the tube if unable to swallow  Continue Wellbutrin  [facility] consult  Family/ staff Communication:   Total Time:  Documentation:  Face to Face:  Family/Phone:   Labs/tests ordered:  None   Medication list reviewed and assessed for continued appropriateness.  Vikki Ports, NP-C Geriatrics [facility] [phone], [place] [address] Cell Phone (Mon-Fri 8am-5pm):  [phone] On Call:  [phone] & follow prompts after 5pm & weekends Office Phone:  [phone] Office Fax:  [phone]

## 2016-07-31 NOTE — Progress Notes (Signed)
Location:      Place of Service:  SNF (31) Provider:  Toni Arthurs, NP-C  Renee Rival, NP  Patient Care Team: Renee Rival, NP as PCP - General (Nurse Practitioner)  Extended Emergency Contact Information Primary Emergency Contact: [name] Address: [address]          [place], [place] [address] [name] of [place] Phone: [phone] Relation: Spouse Secondary Emergency Contact: [name] States of [place] Phone: [phone] Mobile Phone: [phone] Relation: Daughter  Code Status:  full Goals of care: Advanced Directive information Advanced Directives 06/07/2016  Does patient have an advance directive? No  Would patient like information on creating an advanced directive? No - patient declined information     Chief Complaint  Patient presents with  . Acute Visit    HPI:  Pt is a 70 y.o. female seen today for an acute visit for Acute respiratory failure. Patient was evaluated by palliative care NP today. Patient endorsed having some difficulty breathing. She appeared flushed and felt warm. She appeared more lethargic than usual. She reported coughing more than usual. Patient has an extensive history of pneumonia, both bacterial and fungal. Patient reports having a history of aspiration pneumonia. Blood sugars have been very labile. Patient eating minimal, bites only at times. Patient reports that she is "tired."   Past Medical History:  Diagnosis Date  . Asthma   . Breast cancer ([place])   . Carpal tunnel syndrome   . COPD (chronic obstructive pulmonary disease) ([place])   . Depression   . Diabetes mellitus type 2, uncomplicated ([place])   . Hyperlipidemia   . Hypertension   . Hypothyroidism   . Osteoporosis, post-menopausal   . Psoriasis   . Sleep apnea   . Vitamin D deficiency    Past Surgical History:  Procedure Laterality Date  . BREAST EXCISIONAL BIOPSY Left 10/06/2015   np for surgery lumpectomy  . BREAST  LUMPECTOMY WITH NEEDLE LOCALIZATION Left 10/06/2015   Procedure: BREAST LUMPECTOMY WITH NEEDLE LOCALIZATION;  Surgeon: Hubbard Robinson, MD;  Location: [facility] ORS;  Service: General;  Laterality: Left;  . carpal tunnel Right 1995  . Right heel spur removed    . SENTINEL NODE BIOPSY Left 10/06/2015   Procedure: SENTINEL NODE BIOPSY;  Surgeon: Hubbard Robinson, MD;  Location: [facility] ORS;  Service: General;  Laterality: Left;  . THUMB ARTHROSCOPY Left 2000   release of a nerve to the thumb.  . TRACHEOSTOMY TUBE PLACEMENT N/A 03/14/2016   Procedure: TRACHEOSTOMY;  Surgeon: Carloyn Manner, MD;  Location: [facility] ORS;  Service: ENT;  Laterality: N/A;  . TUBAL LIGATION      Allergies  Allergen Reactions  . Other Hives and Rash    Shellfish. Shellfish Pt reports no allergic reaction to iodine or betadine.  . Shellfish Allergy Hives and Rash      Medication List       Accurate as of 07/31/16  9:26 PM. Always use your most recent med list.          aspirin EC 81 MG tablet Take 81 mg by mouth every morning.   BREO ELLIPTA 100-25 MCG/INH Aepb Generic drug:  fluticasone furoate-vilanterol Inhale 2 puffs into the lungs daily.   buPROPion 150 MG 24 hr tablet Commonly known as:  WELLBUTRIN XL Take 150 mg by mouth every morning.   clobetasol 0.05 % external solution Commonly known as:  TEMOVATE Apply 1 application topically daily as needed (for psoriasis.).   clonazePAM  0.25 MG disintegrating tablet Commonly known as:  KLONOPIN Take 1 tablet by mouth every 12 (twelve) hours.   dexamethasone 4 MG tablet Commonly known as:  DECADRON Take 4 mg by mouth daily.   escitalopram 20 MG tablet Commonly known as:  LEXAPRO Take 20 mg by mouth daily.   famotidine 40 MG/5ML suspension Commonly known as:  PEPCID Place 2.5 mLs (20 mg total) into feeding tube daily.   feeding supplement (OSMOLITE 1.5 CAL) Liqd Place 1,000 mLs into feeding tube continuous.   free water Soln Place 200 mLs  into feeding tube every 8 (eight) hours.   furosemide 20 MG tablet Commonly known as:  LASIX Take 20 mg by mouth 2 (two) times daily.   gabapentin 100 MG capsule Commonly known as:  NEURONTIN Take 100 mg by mouth at bedtime.   glipiZIDE 5 MG tablet Commonly known as:  GLUCOTROL Take 1 tablet by mouth daily before lunch.   HUMALOG 100 UNIT/ML injection Generic drug:  insulin lispro   HUMIRA PEN-PSORIASIS STARTER 40 MG/0.8ML Pnkt Generic drug:  Adalimumab Inject 40 mg into the skin every 14 (fourteen) days.   insulin aspart 100 UNIT/ML injection Commonly known as:  novoLOG Inject 4 Units into the skin every 4 (four) hours.   insulin detemir 100 UNIT/ML injection Commonly known as:  LEVEMIR Inject 40 Units into the skin at bedtime.   INVOKANA 100 MG Tabs tablet Generic drug:  canagliflozin   letrozole 2.5 MG tablet Commonly known as:  FEMARA Take 2.5 mg by mouth daily.   levothyroxine 200 MCG tablet Commonly known as:  SYNTHROID, LEVOTHROID Take 200 mcg by mouth daily before breakfast. *Take along with 50 mcg to equal 250 mcg total dose*   levothyroxine 50 MCG tablet Commonly known as:  SYNTHROID, LEVOTHROID Take 50 mcg by mouth daily before breakfast. *Take along with 200 mcg tablet to equal 250 mcg total dose.*   losartan-hydrochlorothiazide 100-25 MG tablet Commonly known as:  HYZAAR Take 1 tablet by mouth daily. In am.   metoprolol 50 MG tablet Commonly known as:  LOPRESSOR Take 50 mg by mouth 2 (two) times daily.   morphine CONCENTRATE 10 MG/0.5ML Soln concentrated solution Take 0.5-1 mLs (10-20 mg total) by mouth every hour as needed for severe pain.   albuterol (2.5 MG/3ML) 0.083% nebulizer solution Commonly known as:  PROVENTIL Inhale 1 vial using nebulizer every six hours as needed for shortness of breath and/or wheezing.   PROAIR HFA 108 (90 Base) MCG/ACT inhaler Generic drug:  albuterol INHALE 2 PUFFS BY MOUTH EVERY 4 HOURS AS NEEDED FOR SHORTNESS  OF BREATH AND/OR WHEEZING.   simvastatin 40 MG tablet Commonly known as:  ZOCOR Take 40 mg by mouth daily at 6 PM.   SPIRIVA HANDIHALER 18 MCG inhalation capsule Generic drug:  tiotropium   SYMBICORT 160-4.5 MCG/ACT inhaler Generic drug:  budesonide-formoterol INHALE 2 PUFFS BY MOUTH TWICE DAILY       Review of Systems  Constitutional: Positive for activity change, appetite change, fever (appears flushed and skin warm) and unexpected weight change. Negative for chills and diaphoresis.  HENT: Positive for congestion. Negative for sneezing, sore throat, trouble swallowing and voice change.   Respiratory: Positive for cough and shortness of breath. Negative for apnea, choking, chest tightness and wheezing.   Cardiovascular: Negative for chest pain, palpitations and leg swelling.  Gastrointestinal: Negative for abdominal distention, abdominal pain, constipation, diarrhea and nausea.  Genitourinary: Negative for difficulty urinating, dysuria, frequency and urgency.  Musculoskeletal: Positive for  myalgias. Negative for back pain and gait problem. Arthralgias: typical arthritis.  Skin: Negative for color change, pallor, rash and wound.  Neurological: Positive for speech difficulty. Negative for dizziness, tremors, syncope, weakness, numbness and headaches.  Psychiatric/Behavioral: Negative for agitation and behavioral problems.  All other systems reviewed and are negative.   Immunization History  Administered Date(s) Administered  . Influenza,inj,Quad PF,36+ Mos 09/07/2015   Pertinent  Health Maintenance Due  Topic Date Due  . FOOT EXAM  05/06/1958  . OPHTHALMOLOGY EXAM  05/06/1958  . COLONOSCOPY  05/06/1998  . PNA vac Low Risk Adult (1 of 2 - PCV13) 05/06/2013  . INFLUENZA VACCINE  06/13/2016  . HEMOGLOBIN A1C  12/06/2016  . MAMMOGRAM  08/24/2017  . DEXA SCAN  Completed   Fall Risk  02/21/2016  Falls in the past year? No   Functional Status Survey:    There were no vitals  filed for this visit. There is no height or weight on file to calculate BMI. Physical Exam  Constitutional: She is oriented to person, place, and time. Vital signs are normal. She appears well-developed and well-nourished. She is active and cooperative. She does not appear ill. No distress.  HENT:  Head: Normocephalic and atraumatic.  Mouth/Throat: Uvula is midline, oropharynx is clear and moist and mucous membranes are normal. Mucous membranes are not pale, not dry and not cyanotic.  Eyes: Conjunctivae, EOM and lids are normal. Pupils are equal, round, and reactive to light.  Neck: Trachea normal, normal range of motion and full passive range of motion without pain. Neck supple. No JVD present. No tracheal deviation, no edema and no erythema present. No thyromegaly present.  Cardiovascular: Normal rate, regular rhythm, normal heart sounds, intact distal pulses and normal pulses.  Exam reveals no gallop, no distant heart sounds and no friction rub.   No murmur heard. Pulmonary/Chest: Effort normal. No accessory muscle usage. No respiratory distress. She has decreased breath sounds in the right lower field and the left upper field. She has no wheezes. She has rhonchi (coarse) in the right upper field, the right middle field, the right lower field, the left upper field, the left middle field and the left lower field. She has no rales. She exhibits no tenderness.  Abdominal: Normal appearance and bowel sounds are normal. She exhibits no distension and no ascites. There is no tenderness.  Musculoskeletal: Normal range of motion. She exhibits no edema or tenderness.  Expected osteoarthritis, stiffness  Neurological: She is oriented to person, place, and time. She has normal strength. She is unresponsive.  Skin: Skin is warm, dry and intact. She is not diaphoretic. No cyanosis. No pallor. Nails show no clubbing.  flushed  Psychiatric: Her speech is normal. Thought content normal. Her affect is blunt. She  is withdrawn. Cognition and memory are normal. She expresses inappropriate judgment. She exhibits a depressed mood. She is inattentive.  Nursing note and vitals reviewed.   Labs reviewed:  Recent Labs  06/10/16 0417  06/12/16 0442 06/12/16 1714  07/07/16 1850 07/12/16 1109 07/31/16 1445  NA 150*  < > 148* 147*  < > 135 138 139  K 2.8*  < > 2.8* 3.5  < > 3.7 3.5 3.6  CL 116*  < > 113* 111  < > 88* 92* 92*  CO2 26  < > 30 29  < > 34* 37* 34*  GLUCOSE 263*  < > 216* 186*  < > 263* 155* 385*  BUN 63*  < > 43* 34*  < >  39* 41* 61*  CREATININE 0.81  < > 0.38* 0.37*  < > 0.42* <0.30* 0.59  CALCIUM 8.9  < > 9.2 9.1  < > 9.3 10.0 10.1  MG 1.8  --  1.4* 1.8  --  1.7  --   --   PHOS 2.2*  --  1.4* 2.9  --   --   --   --   < > = values in this interval not displayed.  Recent Labs  07/07/16 1850 07/12/16 1109 07/31/16 1445  AST 87* 49* 38  ALT 133* 133* 83*  ALKPHOS 155* 134* 114  BILITOT 0.5 0.5 0.4  PROT 6.3* 6.4* 6.1*  ALBUMIN 3.0* 3.3* 2.8*    Recent Labs  07/07/16 1850 07/12/16 1109 07/31/16 1445  WBC 15.4* 11.7* 11.0  NEUTROABS 13.5* 8.1* 9.4*  HGB 10.4* 11.6* 11.2*  HCT 31.1* 35.2 33.5*  MCV 95.0 96.6 96.3  PLT 287 324 238   Lab Results  Component Value Date   TSH 0.124 (L) 07/07/2016   Lab Results  Component Value Date   HGBA1C 10.5 (H) 06/05/2016   Lab Results  Component Value Date   CHOL 173 07/10/2016   HDL 68 07/10/2016   LDLCALC 60 07/10/2016   TRIG 226 (H) 07/10/2016   CHOLHDL 2.5 07/10/2016    Significant Diagnostic Results in last 30 days:  No results found.  Assessment/Plan 1. Acute respiratory failure, unspecified whether with hypoxia or hypercapnia (HCC)  Rocephin 2 g IM 1 now  Levaquin 500 mg IV daily 7  Flagyl 500 mg IV every 6 hours 7 days  Family/ staff Communication:   Total Time:  Documentation:  Face to Face:  Family/Phone: Palliative care NP spent extensive amount of time on the phone and updating daughter and  husband.   Labs/tests ordered:  CBC, met C, CRP, ESR, 2 view chest x-ray  Medication list reviewed and assessed for continued appropriateness.  Vikki Ports, NP-C Geriatrics [facility] [phone], [place] [address] Cell Phone (Mon-Fri 8am-5pm):  [phone] On Call:  [phone] & follow prompts after 5pm & weekends Office Phone:  [phone] Office Fax:  [phone]

## 2016-08-04 ENCOUNTER — Non-Acute Institutional Stay (SKILLED_NURSING_FACILITY): Payer: Medicare Other | Admitting: Gerontology

## 2016-08-04 DIAGNOSIS — E15 Nondiabetic hypoglycemic coma: Secondary | ICD-10-CM | POA: Diagnosis not present

## 2016-08-04 DIAGNOSIS — E441 Mild protein-calorie malnutrition: Secondary | ICD-10-CM | POA: Diagnosis not present

## 2016-08-04 DIAGNOSIS — J9600 Acute respiratory failure, unspecified whether with hypoxia or hypercapnia: Secondary | ICD-10-CM | POA: Diagnosis not present

## 2016-08-04 NOTE — Progress Notes (Signed)
Location:      Place of Service:  SNF (31) Provider:  Toni Arthurs, NP-C  Renee Rival, NP  Patient Care Team: Renee Rival, NP as PCP - General (Nurse Practitioner)  Extended Emergency Contact Information Primary Emergency Contact: [name] Address: [address]          [place], [place] [address] [name] of [place] Phone: [phone] Relation: Spouse Secondary Emergency Contact: [name] States of [place] Phone: [phone] Mobile Phone: [phone] Relation: Daughter  Code Status:  DO NOT RESUSCITATE Goals of care: Advanced Directive information Advanced Directives 06/07/2016  Does patient have an advance directive? No  Would patient like information on creating an advanced directive? No - patient declined information     Chief Complaint  Patient presents with  . Follow-up    HPI:  Pt is a 70 y.o. female seen today for a  f/u s/p Treatment for acute respiratory failure, hypoglycemic shock, and mild malnutrition. Patient had acute episode of hypoglycemic shock last week. Patient was refusing supplements and refusing meals. Insulin has since been adjusted with slow titration of. Supplementing with sliding scale insulin until basal insulin is therapeutic. Also last week, patient was found to be in acute respiratory distress. Patient had low sats, low-grade temp, and adventitious breath sounds. Patient has 7 days worth of IV Levaquin and IV Flagyl. Today, patient endorses feeling much better. Minimal cough. Lung sounds improved. Patient is much more alert and interactive. Patient admits she is beginning to eat a little bit more. Daughter at bedside in agreement. Family is bringing her food from the outside that she enjoys. Overall, patient looks a lot better than previous assessment. Vital signs stable, patient denies pain or dyspnea.  Past Medical History:  Diagnosis Date  . Asthma   . Breast cancer ([place])   . Carpal  tunnel syndrome   . COPD (chronic obstructive pulmonary disease) ([place])   . Depression   . Diabetes mellitus type 2, uncomplicated ([place])   . Hyperlipidemia   . Hypertension   . Hypothyroidism   . Osteoporosis, post-menopausal   . Psoriasis   . Sleep apnea   . Vitamin D deficiency    Past Surgical History:  Procedure Laterality Date  . BREAST EXCISIONAL BIOPSY Left 10/06/2015   np for surgery lumpectomy  . BREAST LUMPECTOMY WITH NEEDLE LOCALIZATION Left 10/06/2015   Procedure: BREAST LUMPECTOMY WITH NEEDLE LOCALIZATION;  Surgeon: Hubbard Robinson, MD;  Location: [facility] ORS;  Service: General;  Laterality: Left;  . carpal tunnel Right 1995  . Right heel spur removed    . SENTINEL NODE BIOPSY Left 10/06/2015   Procedure: SENTINEL NODE BIOPSY;  Surgeon: Hubbard Robinson, MD;  Location: [facility] ORS;  Service: General;  Laterality: Left;  . THUMB ARTHROSCOPY Left 2000   release of a nerve to the thumb.  . TRACHEOSTOMY TUBE PLACEMENT N/A 03/14/2016   Procedure: TRACHEOSTOMY;  Surgeon: Carloyn Manner, MD;  Location: [facility] ORS;  Service: ENT;  Laterality: N/A;  . TUBAL LIGATION      Allergies  Allergen Reactions  . Other Hives and Rash    Shellfish. Shellfish Pt reports no allergic reaction to iodine or betadine.  . Shellfish Allergy Hives and Rash      Medication List       Accurate as of 08/04/16  5:54 PM. Always use your most recent med list.          aspirin EC 81 MG tablet Take 81 mg by  mouth every morning.   BREO ELLIPTA 100-25 MCG/INH Aepb Generic drug:  fluticasone furoate-vilanterol Inhale 2 puffs into the lungs daily.   buPROPion 150 MG 24 hr tablet Commonly known as:  WELLBUTRIN XL Take 150 mg by mouth every morning.   clobetasol 0.05 % external solution Commonly known as:  TEMOVATE Apply 1 application topically daily as needed (for psoriasis.).   clonazePAM 0.25 MG disintegrating tablet Commonly known as:  KLONOPIN Take 1 tablet by mouth every 12 (twelve)  hours.   dexamethasone 4 MG tablet Commonly known as:  DECADRON Take 4 mg by mouth daily.   escitalopram 20 MG tablet Commonly known as:  LEXAPRO Take 20 mg by mouth daily.   famotidine 40 MG/5ML suspension Commonly known as:  PEPCID Place 2.5 mLs (20 mg total) into feeding tube daily.   feeding supplement (OSMOLITE 1.5 CAL) Liqd Place 1,000 mLs into feeding tube continuous.   free water Soln Place 200 mLs into feeding tube every 8 (eight) hours.   furosemide 20 MG tablet Commonly known as:  LASIX Take 20 mg by mouth 2 (two) times daily.   gabapentin 100 MG capsule Commonly known as:  NEURONTIN Take 100 mg by mouth at bedtime.   glipiZIDE 5 MG tablet Commonly known as:  GLUCOTROL Take 1 tablet by mouth daily before lunch.   HUMALOG 100 UNIT/ML injection Generic drug:  insulin lispro   HUMIRA PEN-PSORIASIS STARTER 40 MG/0.8ML Pnkt Generic drug:  Adalimumab Inject 40 mg into the skin every 14 (fourteen) days.   insulin aspart 100 UNIT/ML injection Commonly known as:  novoLOG Inject 4 Units into the skin every 4 (four) hours.   insulin detemir 100 UNIT/ML injection Commonly known as:  LEVEMIR Inject 40 Units into the skin at bedtime.   INVOKANA 100 MG Tabs tablet Generic drug:  canagliflozin   letrozole 2.5 MG tablet Commonly known as:  FEMARA Take 2.5 mg by mouth daily.   levothyroxine 200 MCG tablet Commonly known as:  SYNTHROID, LEVOTHROID Take 200 mcg by mouth daily before breakfast. *Take along with 50 mcg to equal 250 mcg total dose*   levothyroxine 50 MCG tablet Commonly known as:  SYNTHROID, LEVOTHROID Take 50 mcg by mouth daily before breakfast. *Take along with 200 mcg tablet to equal 250 mcg total dose.*   losartan-hydrochlorothiazide 100-25 MG tablet Commonly known as:  HYZAAR Take 1 tablet by mouth daily. In am.   metoprolol 50 MG tablet Commonly known as:  LOPRESSOR Take 50 mg by mouth 2 (two) times daily.   morphine CONCENTRATE 10  MG/0.5ML Soln concentrated solution Take 0.5-1 mLs (10-20 mg total) by mouth every hour as needed for severe pain.   albuterol (2.5 MG/3ML) 0.083% nebulizer solution Commonly known as:  PROVENTIL Inhale 1 vial using nebulizer every six hours as needed for shortness of breath and/or wheezing.   PROAIR HFA 108 (90 Base) MCG/ACT inhaler Generic drug:  albuterol INHALE 2 PUFFS BY MOUTH EVERY 4 HOURS AS NEEDED FOR SHORTNESS OF BREATH AND/OR WHEEZING.   simvastatin 40 MG tablet Commonly known as:  ZOCOR Take 40 mg by mouth daily at 6 PM.   SPIRIVA HANDIHALER 18 MCG inhalation capsule Generic drug:  tiotropium   SYMBICORT 160-4.5 MCG/ACT inhaler Generic drug:  budesonide-formoterol INHALE 2 PUFFS BY MOUTH TWICE DAILY       Review of Systems  Constitutional: Negative for activity change, appetite change, chills, diaphoresis and fever.  HENT: Negative for congestion, sneezing, sore throat, trouble swallowing and voice change.  Eyes: Negative for pain, redness and visual disturbance.  Respiratory: Negative for apnea, cough, choking, chest tightness, shortness of breath and wheezing.   Cardiovascular: Negative for chest pain, palpitations and leg swelling.  Gastrointestinal: Negative for abdominal distention, abdominal pain, constipation, diarrhea and nausea.  Genitourinary: Negative for difficulty urinating, dysuria, frequency and urgency.  Musculoskeletal: Negative for back pain, gait problem and myalgias. Arthralgias: typical arthritis.  Skin: Negative for color change, pallor, rash and wound.  Neurological: Negative for dizziness, tremors, syncope, speech difficulty, weakness, numbness and headaches.  Psychiatric/Behavioral: Negative for agitation and behavioral problems.  All other systems reviewed and are negative.   Immunization History  Administered Date(s) Administered  . Influenza,inj,Quad PF,36+ Mos 09/07/2015   Pertinent  Health Maintenance Due  Topic Date Due  . FOOT  EXAM  05/06/1958  . OPHTHALMOLOGY EXAM  05/06/1958  . COLONOSCOPY  05/06/1998  . PNA vac Low Risk Adult (1 of 2 - PCV13) 05/06/2013  . INFLUENZA VACCINE  06/13/2016  . HEMOGLOBIN A1C  12/06/2016  . MAMMOGRAM  08/24/2017  . DEXA SCAN  Completed   Fall Risk  02/21/2016  Falls in the past year? No   Functional Status Survey:    Vitals:   08/02/16 0500  BP: (!) 152/92  Pulse: 80  Resp: (!) 24  Temp: 97.7 F (36.5 C)  SpO2: 94%   There is no height or weight on file to calculate BMI. Physical Exam  Constitutional: She is oriented to person, place, and time. Vital signs are normal. She appears well-developed and well-nourished. She is active and cooperative. She does not appear ill. No distress.  HENT:  Head: Normocephalic and atraumatic.  Mouth/Throat: Uvula is midline, oropharynx is clear and moist and mucous membranes are normal. Mucous membranes are not pale, not dry and not cyanotic.  Eyes: Conjunctivae, EOM and lids are normal. Pupils are equal, round, and reactive to light.  Neck: Trachea normal, normal range of motion and full passive range of motion without pain. Neck supple. No JVD present. No tracheal deviation, no edema and no erythema present. No thyromegaly present.  Cardiovascular: Normal rate, regular rhythm, normal heart sounds, intact distal pulses and normal pulses.  Exam reveals no gallop and no distant heart sounds.   Pulmonary/Chest: Effort normal. No accessory muscle usage. No respiratory distress. She has decreased breath sounds in the right lower field and the left lower field. She has no wheezes. She has rhonchi (faint) in the right upper field and the left upper field. She exhibits no tenderness.  Abdominal: Normal appearance and bowel sounds are normal. She exhibits no distension and no ascites. There is no tenderness.  Musculoskeletal: Normal range of motion. She exhibits no edema or tenderness.  Expected osteoarthritis, stiffness  Neurological: She is alert  and oriented to person, place, and time. She has normal strength.  Skin: Skin is warm, dry and intact. She is not diaphoretic. No cyanosis. No pallor. Nails show no clubbing.  Psychiatric: She has a normal mood and affect. Her speech is normal and behavior is normal. Judgment and thought content normal. Cognition and memory are normal.  Nursing note and vitals reviewed.   Labs reviewed:  Recent Labs  06/10/16 0417  06/12/16 0442 06/12/16 1714  07/07/16 1850 07/12/16 1109 07/31/16 1445  NA 150*  < > 148* 147*  < > 135 138 139  K 2.8*  < > 2.8* 3.5  < > 3.7 3.5 3.6  CL 116*  < > 113* 111  < >  88* 92* 92*  CO2 26  < > 30 29  < > 34* 37* 34*  GLUCOSE 263*  < > 216* 186*  < > 263* 155* 385*  BUN 63*  < > 43* 34*  < > 39* 41* 61*  CREATININE 0.81  < > 0.38* 0.37*  < > 0.42* <0.30* 0.59  CALCIUM 8.9  < > 9.2 9.1  < > 9.3 10.0 10.1  MG 1.8  --  1.4* 1.8  --  1.7  --   --   PHOS 2.2*  --  1.4* 2.9  --   --   --   --   < > = values in this interval not displayed.  Recent Labs  07/07/16 1850 07/12/16 1109 07/31/16 1445  AST 87* 49* 38  ALT 133* 133* 83*  ALKPHOS 155* 134* 114  BILITOT 0.5 0.5 0.4  PROT 6.3* 6.4* 6.1*  ALBUMIN 3.0* 3.3* 2.8*    Recent Labs  07/07/16 1850 07/12/16 1109 07/31/16 1445  WBC 15.4* 11.7* 11.0  NEUTROABS 13.5* 8.1* 9.4*  HGB 10.4* 11.6* 11.2*  HCT 31.1* 35.2 33.5*  MCV 95.0 96.6 96.3  PLT 287 324 238   Lab Results  Component Value Date   TSH 0.124 (L) 07/07/2016   Lab Results  Component Value Date   HGBA1C 10.5 (H) 06/05/2016   Lab Results  Component Value Date   CHOL 173 07/10/2016   HDL 68 07/10/2016   LDLCALC 60 07/10/2016   TRIG 226 (H) 07/10/2016   CHOLHDL 2.5 07/10/2016    Significant Diagnostic Results in last 30 days:  No results found.  Assessment/Plan 1. Acute respiratory failure, unspecified whether with hypoxia or hypercapnia (HCC)  Resolved  Continue O2 2 L via nasal cannula  Encourage incentive  spirometer  Patient sitting upright for meals  2. Hypoglycemic shock (HCC)  Resolved  Manage insulin  Encourage by mouth intake  3. Mild malnutrition ([place])  Continue supplements  Continue encouraging by mouth intake of any kind   Family/ staff Communication:   Total Time:  Documentation:  Face to Face:  Family/Phone: Daughter at bedside   Labs/tests ordered:  CBC, met C  Vikki Ports, NP-C Geriatrics [facility] [address], [place] [address] Cell Phone (Mon-Fri 8am-5pm):  [phone] On Call:  [phone] & follow prompts after 5pm & weekends Office Phone:  [phone] Office Fax:  [phone]

## 2016-08-08 DIAGNOSIS — R39198 Other difficulties with micturition: Secondary | ICD-10-CM | POA: Diagnosis not present

## 2016-08-08 LAB — CBC WITH DIFFERENTIAL/PLATELET
BASOS ABS: 0 10*3/uL (ref 0–0.1)
Basophils Relative: 0 %
EOS ABS: 0 10*3/uL (ref 0–0.7)
Eosinophils Relative: 0 %
HCT: 33.2 % — ABNORMAL LOW (ref 35.0–47.0)
HEMOGLOBIN: 11.3 g/dL — AB (ref 12.0–16.0)
Lymphocytes Relative: 20 %
Lymphs Abs: 1.3 10*3/uL (ref 1.0–3.6)
MCH: 32.8 pg (ref 26.0–34.0)
MCHC: 34 g/dL (ref 32.0–36.0)
MCV: 96.5 fL (ref 80.0–100.0)
MONO ABS: 0.4 10*3/uL (ref 0.2–0.9)
Monocytes Relative: 6 %
NEUTROS PCT: 74 %
Neutro Abs: 4.6 10*3/uL (ref 1.4–6.5)
Platelets: 191 10*3/uL (ref 150–440)
RBC: 3.44 MIL/uL — AB (ref 3.80–5.20)
RDW: 18.2 % — ABNORMAL HIGH (ref 11.5–14.5)
WBC: 6.3 10*3/uL (ref 3.6–11.0)

## 2016-08-08 LAB — COMPREHENSIVE METABOLIC PANEL
ALBUMIN: 2.6 g/dL — AB (ref 3.5–5.0)
ALK PHOS: 102 U/L (ref 38–126)
ALT: 30 U/L (ref 14–54)
AST: 24 U/L (ref 15–41)
Anion gap: 11 (ref 5–15)
BUN: 41 mg/dL — ABNORMAL HIGH (ref 6–20)
CALCIUM: 9.6 mg/dL (ref 8.9–10.3)
CO2: 34 mmol/L — AB (ref 22–32)
CREATININE: 0.56 mg/dL (ref 0.44–1.00)
Chloride: 98 mmol/L — ABNORMAL LOW (ref 101–111)
GFR calc Af Amer: >60 mL/min (ref >60)
GFR calc non Af Amer: >60 mL/min (ref >60)
GLUCOSE: 226 mg/dL — AB (ref 65–99)
Potassium: 3 mmol/L — ABNORMAL LOW (ref 3.5–5.1)
SODIUM: 143 mmol/L (ref 135–145)
Total Bilirubin: 0.6 mg/dL (ref 0.3–1.2)
Total Protein: 5.7 g/dL — ABNORMAL LOW (ref 6.5–8.1)

## 2016-08-09 DIAGNOSIS — R39198 Other difficulties with micturition: Secondary | ICD-10-CM | POA: Diagnosis not present

## 2016-08-09 LAB — BASIC METABOLIC PANEL
Anion gap: 9 (ref 5–15)
BUN: 43 mg/dL — AB (ref 6–20)
CHLORIDE: 98 mmol/L — AB (ref 101–111)
CO2: 36 mmol/L — ABNORMAL HIGH (ref 22–32)
Calcium: 9.6 mg/dL (ref 8.9–10.3)
Creatinine, Ser: 0.44 mg/dL (ref 0.44–1.00)
GFR calc Af Amer: >60 mL/min (ref >60)
GFR calc non Af Amer: >60 mL/min (ref >60)
GLUCOSE: 142 mg/dL — AB (ref 65–99)
POTASSIUM: 4 mmol/L (ref 3.5–5.1)
Sodium: 143 mmol/L (ref 135–145)

## 2016-08-10 ENCOUNTER — Non-Acute Institutional Stay (SKILLED_NURSING_FACILITY): Payer: Medicare Other | Admitting: Gerontology

## 2016-08-10 ENCOUNTER — Ambulatory Visit: Attending: Radiation Oncology | Admitting: Radiation Oncology

## 2016-08-10 DIAGNOSIS — L89153 Pressure ulcer of sacral region, stage 3: Secondary | ICD-10-CM

## 2016-08-11 ENCOUNTER — Non-Acute Institutional Stay (SKILLED_NURSING_FACILITY): Payer: Medicare Other | Admitting: Gerontology

## 2016-08-11 DIAGNOSIS — K5732 Diverticulitis of large intestine without perforation or abscess without bleeding: Secondary | ICD-10-CM

## 2016-08-11 DIAGNOSIS — J441 Chronic obstructive pulmonary disease with (acute) exacerbation: Secondary | ICD-10-CM

## 2016-08-13 ENCOUNTER — Encounter
Admission: RE | Admit: 2016-08-13 | Discharge: 2016-08-13 | Disposition: A | Discharge status: Home / Self Care | Source: Ambulatory Visit | Attending: Internal Medicine | Admitting: Internal Medicine

## 2016-08-13 DIAGNOSIS — E119 Type 2 diabetes mellitus without complications: Secondary | ICD-10-CM | POA: Insufficient documentation

## 2016-08-14 DIAGNOSIS — K5732 Diverticulitis of large intestine without perforation or abscess without bleeding: Secondary | ICD-10-CM | POA: Insufficient documentation

## 2016-08-14 NOTE — Progress Notes (Signed)
Location:      Place of Service:  SNF (31) Provider:  Toni Arthurs, NP-C  Renee Rival, NP  Patient Care Team: Renee Rival, NP as PCP - General (Nurse Practitioner)  Extended Emergency Contact Information Primary Emergency Contact: [name] Address: [address]          [place], [place] [address] [name] of [place] Phone: [phone] Relation: Spouse Secondary Emergency Contact: [name] States of [place] Phone: [phone] Mobile Phone: [phone] Relation: Daughter  Code Status: DO NOT RESUSCITATE Goals of care: Advanced Directive information Advanced Directives 06/07/2016  Does patient have an advance directive? No  Would patient like information on creating an advanced directive? No - patient declined information     Chief Complaint  Patient presents with  . Acute Visit    HPI:  Pt is a 70 y.o. female seen today for an acute visit for Pulmonary congestion/cough as well as abdominal pain. Nursing concerned about patient, due to increased productive, "wet" sounding cough. Increased fatigue and lethargy. Patient unable to consistently answer questions. Patient did express guarding and grimacing on abdominal palpation, especially in the left lower quadrant. Patient does report she has a history of diverticulosis/diverticulitis. Afebrile. Vital signs stable    Past Medical History:  Diagnosis Date  . Asthma   . Breast cancer ([place])   . Carpal tunnel syndrome   . COPD (chronic obstructive pulmonary disease) ([place])   . Depression   . Diabetes mellitus type 2, uncomplicated ([place])   . Hyperlipidemia   . Hypertension   . Hypothyroidism   . Osteoporosis, post-menopausal   . Psoriasis   . Sleep apnea   . Vitamin D deficiency    Past Surgical History:  Procedure Laterality Date  . BREAST EXCISIONAL BIOPSY Left 10/06/2015   np for surgery lumpectomy  . BREAST LUMPECTOMY WITH NEEDLE LOCALIZATION Left 10/06/2015   Procedure: BREAST LUMPECTOMY WITH NEEDLE LOCALIZATION;  Surgeon: Hubbard Robinson, MD;  Location: [facility] ORS;  Service: General;  Laterality: Left;  . carpal tunnel Right 1995  . Right heel spur removed    . SENTINEL NODE BIOPSY Left 10/06/2015   Procedure: SENTINEL NODE BIOPSY;  Surgeon: Hubbard Robinson, MD;  Location: [facility] ORS;  Service: General;  Laterality: Left;  . THUMB ARTHROSCOPY Left 2000   release of a nerve to the thumb.  . TRACHEOSTOMY TUBE PLACEMENT N/A 03/14/2016   Procedure: TRACHEOSTOMY;  Surgeon: Carloyn Manner, MD;  Location: [facility] ORS;  Service: ENT;  Laterality: N/A;  . TUBAL LIGATION      Allergies  Allergen Reactions  . Other Hives and Rash    Shellfish. Shellfish Pt reports no allergic reaction to iodine or betadine.  . Shellfish Allergy Hives and Rash      Medication List       Accurate as of 08/11/16 11:59 PM. Always use your most recent med list.          aspirin EC 81 MG tablet Take 81 mg by mouth every morning.   BREO ELLIPTA 100-25 MCG/INH Aepb Generic drug:  fluticasone furoate-vilanterol Inhale 2 puffs into the lungs daily.   buPROPion 150 MG 24 hr tablet Commonly known as:  WELLBUTRIN XL Take 150 mg by mouth every morning.   clobetasol 0.05 % external solution Commonly known as:  TEMOVATE Apply 1 application topically daily as needed (for psoriasis.).   clonazePAM 0.25 MG disintegrating tablet Commonly known as:  KLONOPIN Take 1 tablet by mouth every 12 (twelve)  hours.   dexamethasone 4 MG tablet Commonly known as:  DECADRON Take 4 mg by mouth daily.   escitalopram 20 MG tablet Commonly known as:  LEXAPRO Take 20 mg by mouth daily.   famotidine 40 MG/5ML suspension Commonly known as:  PEPCID Place 2.5 mLs (20 mg total) into feeding tube daily.   feeding supplement (OSMOLITE 1.5 CAL) Liqd Place 1,000 mLs into feeding tube continuous.   free water Soln Place 200 mLs into feeding tube every 8 (eight) hours.   furosemide  20 MG tablet Commonly known as:  LASIX Take 20 mg by mouth 2 (two) times daily.   gabapentin 100 MG capsule Commonly known as:  NEURONTIN Take 100 mg by mouth at bedtime.   glipiZIDE 5 MG tablet Commonly known as:  GLUCOTROL Take 1 tablet by mouth daily before lunch.   HUMALOG 100 UNIT/ML injection Generic drug:  insulin lispro   HUMIRA PEN-PSORIASIS STARTER 40 MG/0.8ML Pnkt Generic drug:  Adalimumab Inject 40 mg into the skin every 14 (fourteen) days.   insulin aspart 100 UNIT/ML injection Commonly known as:  novoLOG Inject 4 Units into the skin every 4 (four) hours.   insulin detemir 100 UNIT/ML injection Commonly known as:  LEVEMIR Inject 40 Units into the skin at bedtime.   INVOKANA 100 MG Tabs tablet Generic drug:  canagliflozin   letrozole 2.5 MG tablet Commonly known as:  FEMARA Take 2.5 mg by mouth daily.   levothyroxine 200 MCG tablet Commonly known as:  SYNTHROID, LEVOTHROID Take 200 mcg by mouth daily before breakfast. *Take along with 50 mcg to equal 250 mcg total dose*   levothyroxine 50 MCG tablet Commonly known as:  SYNTHROID, LEVOTHROID Take 50 mcg by mouth daily before breakfast. *Take along with 200 mcg tablet to equal 250 mcg total dose.*   losartan-hydrochlorothiazide 100-25 MG tablet Commonly known as:  HYZAAR Take 1 tablet by mouth daily. In am.   metoprolol 50 MG tablet Commonly known as:  LOPRESSOR Take 50 mg by mouth 2 (two) times daily.   morphine CONCENTRATE 10 MG/0.5ML Soln concentrated solution Take 0.5-1 mLs (10-20 mg total) by mouth every hour as needed for severe pain.   albuterol (2.5 MG/3ML) 0.083% nebulizer solution Commonly known as:  PROVENTIL Inhale 1 vial using nebulizer every six hours as needed for shortness of breath and/or wheezing.   PROAIR HFA 108 (90 Base) MCG/ACT inhaler Generic drug:  albuterol INHALE 2 PUFFS BY MOUTH EVERY 4 HOURS AS NEEDED FOR SHORTNESS OF BREATH AND/OR WHEEZING.   simvastatin 40 MG  tablet Commonly known as:  ZOCOR Take 40 mg by mouth daily at 6 PM.   SPIRIVA HANDIHALER 18 MCG inhalation capsule Generic drug:  tiotropium   SYMBICORT 160-4.5 MCG/ACT inhaler Generic drug:  budesonide-formoterol INHALE 2 PUFFS BY MOUTH TWICE DAILY       Review of Systems  Constitutional: Negative for activity change, appetite change, chills, diaphoresis and fever.  HENT: Negative for congestion, sneezing, sore throat, trouble swallowing and voice change.   Eyes: Negative for pain, redness and visual disturbance.  Respiratory: Negative for apnea, cough, choking, chest tightness, shortness of breath and wheezing.   Cardiovascular: Negative for chest pain, palpitations and leg swelling.  Gastrointestinal: Negative for abdominal distention, abdominal pain, constipation, diarrhea and nausea.  Genitourinary: Negative for difficulty urinating, dysuria, frequency and urgency.  Musculoskeletal: Negative for back pain, gait problem and myalgias. Arthralgias: typical arthritis.  Skin: Negative for color change, pallor, rash and wound.  Neurological: Negative for dizziness,  tremors, syncope, speech difficulty, weakness, numbness and headaches.  Psychiatric/Behavioral: Negative for agitation and behavioral problems.  All other systems reviewed and are negative.   Immunization History  Administered Date(s) Administered  . Influenza,inj,Quad PF,36+ Mos 09/07/2015   Pertinent  Health Maintenance Due  Topic Date Due  . FOOT EXAM  05/06/1958  . OPHTHALMOLOGY EXAM  05/06/1958  . COLONOSCOPY  05/06/1998  . PNA vac Low Risk Adult (1 of 2 - PCV13) 05/06/2013  . INFLUENZA VACCINE  06/13/2016  . HEMOGLOBIN A1C  12/06/2016  . MAMMOGRAM  08/24/2017  . DEXA SCAN  Completed   Fall Risk  02/21/2016  Falls in the past year? No   Functional Status Survey:    Vitals:   08/11/16 1030  BP: (!) 116/97  Pulse: 69  Resp: (!) 28  Temp: 98.3 F (36.8 C)  SpO2: 97%   There is no height or weight  on file to calculate BMI. Physical Exam  Constitutional: She is oriented to person, place, and time. Vital signs are normal. She appears well-developed and well-nourished. She appears lethargic. She is active and cooperative. She does not appear ill. No distress.  HENT:  Head: Normocephalic and atraumatic.  Mouth/Throat: Uvula is midline, oropharynx is clear and moist and mucous membranes are normal. Mucous membranes are not pale, not dry and not cyanotic.  Eyes: Conjunctivae, EOM and lids are normal. Pupils are equal, round, and reactive to light.  Neck: Trachea normal, normal range of motion and full passive range of motion without pain. Neck supple. No JVD present. No tracheal deviation, no edema and no erythema present. No thyromegaly present.  Cardiovascular: Normal rate, regular rhythm, normal heart sounds, intact distal pulses and normal pulses.  Exam reveals no gallop, no distant heart sounds and no friction rub.   No murmur heard. Pulmonary/Chest: Effort normal. No accessory muscle usage. No respiratory distress. She has decreased breath sounds in the right lower field and the left lower field. She has wheezes in the right upper field and the left upper field. She has rhonchi (faint) in the right upper field and the left upper field. She has no rales. She exhibits no tenderness.  Abdominal: Soft. Normal appearance and bowel sounds are normal. She exhibits no distension and no ascites. There is tenderness in the left lower quadrant. There is guarding.  Musculoskeletal: Normal range of motion. She exhibits no edema or tenderness.  Expected osteoarthritis, stiffness  Neurological: She is oriented to person, place, and time. She has normal strength. She appears lethargic.  Skin: Skin is warm, dry and intact. She is not diaphoretic. No cyanosis. No pallor. Nails show no clubbing.  Psychiatric: She has a normal mood and affect. Her speech is normal and behavior is normal. Judgment and thought  content normal. Cognition and memory are normal.  Nursing note and vitals reviewed.   Labs reviewed:  Recent Labs  06/10/16 0417  06/12/16 0442 06/12/16 1714  07/07/16 1850  07/31/16 1445 08/08/16 0811 08/09/16 0715  NA 150*  < > 148* 147*  < > 135  < > 139 143 143  K 2.8*  < > 2.8* 3.5  < > 3.7  < > 3.6 3.0* 4.0  CL 116*  < > 113* 111  < > 88*  < > 92* 98* 98*  CO2 26  < > 30 29  < > 34*  < > 34* 34* 36*  GLUCOSE 263*  < > 216* 186*  < > 263*  < > 385*  226* 142*  BUN 63*  < > 43* 34*  < > 39*  < > 61* 41* 43*  CREATININE 0.81  < > 0.38* 0.37*  < > 0.42*  < > 0.59 0.56 0.44  CALCIUM 8.9  < > 9.2 9.1  < > 9.3  < > 10.1 9.6 9.6  MG 1.8  --  1.4* 1.8  --  1.7  --   --   --   --   PHOS 2.2*  --  1.4* 2.9  --   --   --   --   --   --   < > = values in this interval not displayed.  Recent Labs  07/12/16 1109 07/31/16 1445 08/08/16 0811  AST 49* 38 24  ALT 133* 83* 30  ALKPHOS 134* 114 102  BILITOT 0.5 0.4 0.6  PROT 6.4* 6.1* 5.7*  ALBUMIN 3.3* 2.8* 2.6*    Recent Labs  07/12/16 1109 07/31/16 1445 08/08/16 0811  WBC 11.7* 11.0 6.3  NEUTROABS 8.1* 9.4* 4.6  HGB 11.6* 11.2* 11.3*  HCT 35.2 33.5* 33.2*  MCV 96.6 96.3 96.5  PLT 324 238 191   Lab Results  Component Value Date   TSH 0.124 (L) 07/07/2016   Lab Results  Component Value Date   HGBA1C 10.5 (H) 06/05/2016   Lab Results  Component Value Date   CHOL 173 07/10/2016   HDL 68 07/10/2016   LDLCALC 60 07/10/2016   TRIG 226 (H) 07/10/2016   CHOLHDL 2.5 07/10/2016    Significant Diagnostic Results in last 30 days:  No results found.  Assessment/Plan 1. Diverticulitis of colon  Cipro 500 mg by mouth every 12 hours 7 days  Metronidazole 500 mg by mouth 4 times a day 7 days for diverticulitis  60 mL half water/half-normal saline per PEG every 3 hours for hydration  2. COPD with exacerbation (HCC)  Solu-Medrol 125 mg IM 1 now  Prednisone taper starting tomorrow morning. 60 mg by mouth daily  5, then 40 mg by mouth daily 5, then 20 mg by mouth daily 5, then 10 mg by mouth daily 4  Increase scale of sliding-scale insulin  Family/ staff Communication:   Total Time:  Documentation:  Face to Face:  Family/Phone:   Labs/tests ordered:    Medication list reviewed and assessed for continued appropriateness.  Vikki Ports, NP-C Geriatrics [facility] [phone], [place] [address] Cell Phone (Mon-Fri 8am-5pm):  [phone] On Call:  [phone] & follow prompts after 5pm & weekends Office Phone:  [phone] Office Fax:  [phone]

## 2016-08-14 NOTE — Progress Notes (Signed)
Location:      Place of Service:  SNF (31) Provider:  Toni Arthurs, NP-C  Renee Rival, NP  Patient Care Team: Renee Rival, NP as PCP - General (Nurse Practitioner)  Extended Emergency Contact Information Primary Emergency Contact: [name] Address: [address]          [place], [place] [address] [name] of [place] Phone: [phone] Relation: Spouse Secondary Emergency Contact: [name] States of [place] Phone: [phone] Mobile Phone: [phone] Relation: Daughter  Code Status:  dnr Goals of care: Advanced Directive information Advanced Directives 06/07/2016  Does patient have an advance directive? No  Would patient like information on creating an advanced directive? No - patient declined information     Chief Complaint  Patient presents with  . Wound Check    HPI:  Pt is a 70 y.o. female seen today for medical management of chronic diseases. Sacral wound was re-assessed and wound vac was reapplied. Wound bed is pink and moist. Small amount of necrotic tissue. Mild odor. Wound measured 10x7x0.5 cm. Pt's PO intake has improved slightly. On supplements. On air mattress. Turned and repositioned frequently. Pt tolerated procedure well. VSS. No complaints. Says pain is well controlled.   Past Medical History:  Diagnosis Date  . Asthma   . Breast cancer ([place])   . Carpal tunnel syndrome   . COPD (chronic obstructive pulmonary disease) ([place])   . Depression   . Diabetes mellitus type 2, uncomplicated ([place])   . Hyperlipidemia   . Hypertension   . Hypothyroidism   . Osteoporosis, post-menopausal   . Psoriasis   . Sleep apnea   . Vitamin D deficiency    Past Surgical History:  Procedure Laterality Date  . BREAST EXCISIONAL BIOPSY Left 10/06/2015   np for surgery lumpectomy  . BREAST LUMPECTOMY WITH NEEDLE LOCALIZATION Left 10/06/2015   Procedure: BREAST LUMPECTOMY WITH NEEDLE LOCALIZATION;  Surgeon: Hubbard Robinson, MD;  Location: [facility] ORS;  Service: General;  Laterality: Left;  . carpal tunnel Right 1995  . Right heel spur removed    . SENTINEL NODE BIOPSY Left 10/06/2015   Procedure: SENTINEL NODE BIOPSY;  Surgeon: Hubbard Robinson, MD;  Location: [facility] ORS;  Service: General;  Laterality: Left;  . THUMB ARTHROSCOPY Left 2000   release of a nerve to the thumb.  . TRACHEOSTOMY TUBE PLACEMENT N/A 03/14/2016   Procedure: TRACHEOSTOMY;  Surgeon: Carloyn Manner, MD;  Location: [facility] ORS;  Service: ENT;  Laterality: N/A;  . TUBAL LIGATION      Allergies  Allergen Reactions  . Other Hives and Rash    Shellfish. Shellfish Pt reports no allergic reaction to iodine or betadine.  . Shellfish Allergy Hives and Rash      Medication List       Accurate as of 08/10/16 11:59 PM. Always use your most recent med list.          aspirin EC 81 MG tablet Take 81 mg by mouth every morning.   BREO ELLIPTA 100-25 MCG/INH Aepb Generic drug:  fluticasone furoate-vilanterol Inhale 2 puffs into the lungs daily.   buPROPion 150 MG 24 hr tablet Commonly known as:  WELLBUTRIN XL Take 150 mg by mouth every morning.   clobetasol 0.05 % external solution Commonly known as:  TEMOVATE Apply 1 application topically daily as needed (for psoriasis.).   clonazePAM 0.25 MG disintegrating tablet Commonly known as:  KLONOPIN Take 1 tablet by mouth every 12 (twelve) hours.  dexamethasone 4 MG tablet Commonly known as:  DECADRON Take 4 mg by mouth daily.   escitalopram 20 MG tablet Commonly known as:  LEXAPRO Take 20 mg by mouth daily.   famotidine 40 MG/5ML suspension Commonly known as:  PEPCID Place 2.5 mLs (20 mg total) into feeding tube daily.   feeding supplement (OSMOLITE 1.5 CAL) Liqd Place 1,000 mLs into feeding tube continuous.   free water Soln Place 200 mLs into feeding tube every 8 (eight) hours.   furosemide 20 MG tablet Commonly known as:  LASIX Take 20 mg by mouth 2 (two) times  daily.   gabapentin 100 MG capsule Commonly known as:  NEURONTIN Take 100 mg by mouth at bedtime.   glipiZIDE 5 MG tablet Commonly known as:  GLUCOTROL Take 1 tablet by mouth daily before lunch.   HUMALOG 100 UNIT/ML injection Generic drug:  insulin lispro   HUMIRA PEN-PSORIASIS STARTER 40 MG/0.8ML Pnkt Generic drug:  Adalimumab Inject 40 mg into the skin every 14 (fourteen) days.   insulin aspart 100 UNIT/ML injection Commonly known as:  novoLOG Inject 4 Units into the skin every 4 (four) hours.   insulin detemir 100 UNIT/ML injection Commonly known as:  LEVEMIR Inject 40 Units into the skin at bedtime.   INVOKANA 100 MG Tabs tablet Generic drug:  canagliflozin   letrozole 2.5 MG tablet Commonly known as:  FEMARA Take 2.5 mg by mouth daily.   levothyroxine 200 MCG tablet Commonly known as:  SYNTHROID, LEVOTHROID Take 200 mcg by mouth daily before breakfast. *Take along with 50 mcg to equal 250 mcg total dose*   levothyroxine 50 MCG tablet Commonly known as:  SYNTHROID, LEVOTHROID Take 50 mcg by mouth daily before breakfast. *Take along with 200 mcg tablet to equal 250 mcg total dose.*   losartan-hydrochlorothiazide 100-25 MG tablet Commonly known as:  HYZAAR Take 1 tablet by mouth daily. In am.   metoprolol 50 MG tablet Commonly known as:  LOPRESSOR Take 50 mg by mouth 2 (two) times daily.   morphine CONCENTRATE 10 MG/0.5ML Soln concentrated solution Take 0.5-1 mLs (10-20 mg total) by mouth every hour as needed for severe pain.   albuterol (2.5 MG/3ML) 0.083% nebulizer solution Commonly known as:  PROVENTIL Inhale 1 vial using nebulizer every six hours as needed for shortness of breath and/or wheezing.   PROAIR HFA 108 (90 Base) MCG/ACT inhaler Generic drug:  albuterol INHALE 2 PUFFS BY MOUTH EVERY 4 HOURS AS NEEDED FOR SHORTNESS OF BREATH AND/OR WHEEZING.   simvastatin 40 MG tablet Commonly known as:  ZOCOR Take 40 mg by mouth daily at 6 PM.   SPIRIVA  HANDIHALER 18 MCG inhalation capsule Generic drug:  tiotropium   SYMBICORT 160-4.5 MCG/ACT inhaler Generic drug:  budesonide-formoterol INHALE 2 PUFFS BY MOUTH TWICE DAILY       Review of Systems  Constitutional: Positive for activity change and appetite change. Negative for chills, diaphoresis and fever.  HENT: Negative for congestion, sneezing, sore throat, trouble swallowing and voice change.   Eyes: Negative for pain, redness and visual disturbance.  Respiratory: Negative for apnea, cough, choking, chest tightness, shortness of breath and wheezing.   Cardiovascular: Negative for chest pain, palpitations and leg swelling.  Gastrointestinal: Negative for abdominal distention, abdominal pain, constipation, diarrhea, nausea and vomiting.  Genitourinary: Negative for difficulty urinating, dysuria, frequency and urgency.  Musculoskeletal: Negative for back pain, gait problem and myalgias. Arthralgias: typical arthritis.  Skin: Positive for pallor and wound. Negative for color change and rash.  Neurological: Negative for dizziness, tremors, syncope, speech difficulty, weakness, numbness and headaches.  Psychiatric/Behavioral: Negative for agitation and behavioral problems.  All other systems reviewed and are negative.   Immunization History  Administered Date(s) Administered  . Influenza,inj,Quad PF,36+ Mos 09/07/2015   Pertinent  Health Maintenance Due  Topic Date Due  . FOOT EXAM  05/06/1956  . OPHTHALMOLOGY EXAM  05/06/1958  . COLONOSCOPY  05/06/1998  . PNA vac Low Risk Adult (1 of 2 - PCV13) 05/06/2013  . INFLUENZA VACCINE  06/13/2016  . HEMOGLOBIN A1C  12/06/2016  . MAMMOGRAM  08/24/2017  . DEXA SCAN  Completed   Fall Risk  02/21/2016  Falls in the past year? No   Functional Status Survey:    Vitals:   08/06/16 2100  BP: (!) 104/56  Pulse: 69  Resp: 16  Temp: 98.5 F (36.9 C)  SpO2: 97%  Weight: 177 lb 3.2 oz (80.4 kg)   Body mass index is 32.41  kg/m. Physical Exam  Constitutional: She is oriented to person, place, and time. Vital signs are normal. She appears well-developed and well-nourished. She is active and cooperative. She does not appear ill. No distress.  HENT:  Head: Normocephalic and atraumatic.  Mouth/Throat: Uvula is midline, oropharynx is clear and moist and mucous membranes are normal. Mucous membranes are not pale, not dry and not cyanotic.  Eyes: Conjunctivae, EOM and lids are normal. Pupils are equal, round, and reactive to light.  Neck: Trachea normal, normal range of motion and full passive range of motion without pain. Neck supple. No JVD present. No tracheal deviation, no edema and no erythema present. No thyromegaly present.  Cardiovascular: Regular rhythm, normal heart sounds, intact distal pulses and normal pulses.  Tachycardia present.  Exam reveals no gallop, no distant heart sounds and no friction rub.   No murmur heard. Pulmonary/Chest: Effort normal. No accessory muscle usage. No tachypnea. No respiratory distress. She has no decreased breath sounds. She has no wheezes. She has no rhonchi. She has no rales. She exhibits no tenderness.  Abdominal: Soft. Normal appearance and bowel sounds are normal. She exhibits no distension, no pulsatile liver, no fluid wave, no ascites and no pulsatile midline mass. There is no tenderness.  PEG tube in place  Musculoskeletal: Normal range of motion. She exhibits no edema or tenderness.  Expected osteoarthritis, stiffness  Neurological: She is alert and oriented to person, place, and time. She has normal strength.  Skin: Skin is warm, dry and intact. Lesion (stage III pressure wound to sacrum) noted. No rash noted. She is not diaphoretic. No cyanosis or erythema. No pallor. Nails show no clubbing.     Decubitus ulcer to ischial.  Psychiatric: She has a normal mood and affect. Her speech is normal and behavior is normal. Judgment and thought content normal. Cognition and  memory are normal.  Nursing note and vitals reviewed.   Labs reviewed:  Recent Labs  06/10/16 0417  06/12/16 0442 06/12/16 1714  07/07/16 1850  07/31/16 1445 08/08/16 0811 08/09/16 0715  NA 150*  < > 148* 147*  < > 135  < > 139 143 143  K 2.8*  < > 2.8* 3.5  < > 3.7  < > 3.6 3.0* 4.0  CL 116*  < > 113* 111  < > 88*  < > 92* 98* 98*  CO2 26  < > 30 29  < > 34*  < > 34* 34* 36*  GLUCOSE 263*  < > 216* 186*  < >  263*  < > 385* 226* 142*  BUN 63*  < > 43* 34*  < > 39*  < > 61* 41* 43*  CREATININE 0.81  < > 0.38* 0.37*  < > 0.42*  < > 0.59 0.56 0.44  CALCIUM 8.9  < > 9.2 9.1  < > 9.3  < > 10.1 9.6 9.6  MG 1.8  --  1.4* 1.8  --  1.7  --   --   --   --   PHOS 2.2*  --  1.4* 2.9  --   --   --   --   --   --   < > = values in this interval not displayed.  Recent Labs  07/12/16 1109 07/31/16 1445 08/08/16 0811  AST 49* 38 24  ALT 133* 83* 30  ALKPHOS 134* 114 102  BILITOT 0.5 0.4 0.6  PROT 6.4* 6.1* 5.7*  ALBUMIN 3.3* 2.8* 2.6*    Recent Labs  07/12/16 1109 07/31/16 1445 08/08/16 0811  WBC 11.7* 11.0 6.3  NEUTROABS 8.1* 9.4* 4.6  HGB 11.6* 11.2* 11.3*  HCT 35.2 33.5* 33.2*  MCV 96.6 96.3 96.5  PLT 324 238 191   Lab Results  Component Value Date   TSH 0.124 (L) 07/07/2016   Lab Results  Component Value Date   HGBA1C 10.5 (H) 06/05/2016   Lab Results  Component Value Date   CHOL 173 07/10/2016   HDL 68 07/10/2016   LDLCALC 60 07/10/2016   TRIG 226 (H) 07/10/2016   CHOLHDL 2.5 07/10/2016    Significant Diagnostic Results in last 30 days:  No results found.  Assessment/Plan 1. Decubitus ulcer of sacral region, stage 3 (HCC)  Changed wound vac  Change vac tues, thurs, and sat.   Family/ staff Communication:   Total Time:  Documentation:  Face to Face:  Family/Phone:   Labs/tests ordered:    Medication list reviewed and assessed for continued appropriateness. Monthly medication orders reviewed and signed.  Vikki Ports,  NP-C Geriatrics [facility] [phone], [place] [address] Cell Phone (Mon-Fri 8am-5pm):  [phone] On Call:  [phone] & follow prompts after 5pm & weekends Office Phone:  [phone] Office Fax:  [phone]

## 2016-08-15 LAB — ACID FAST CULTURE WITH REFLEXED SENSITIVITIES (MYCOBACTERIA): Acid Fast Culture: NEGATIVE

## 2016-08-15 LAB — ACID FAST CULTURE WITH REFLEXED SENSITIVITIES

## 2016-08-17 ENCOUNTER — Encounter: Payer: Self-pay | Admitting: Emergency Medicine

## 2016-08-17 ENCOUNTER — Non-Acute Institutional Stay (SKILLED_NURSING_FACILITY): Payer: Medicare Other | Admitting: Gerontology

## 2016-08-17 ENCOUNTER — Emergency Department

## 2016-08-17 ENCOUNTER — Inpatient Hospital Stay
Admission: EM | Admit: 2016-08-17 | Discharge: 2016-08-19 | DRG: 177 | Disposition: A | Discharge status: Skilled Nursing Facility | Attending: Internal Medicine | Admitting: Internal Medicine

## 2016-08-17 DIAGNOSIS — G4730 Sleep apnea, unspecified: Secondary | ICD-10-CM | POA: Diagnosis present

## 2016-08-17 DIAGNOSIS — Y95 Nosocomial condition: Secondary | ICD-10-CM | POA: Diagnosis present

## 2016-08-17 DIAGNOSIS — Z853 Personal history of malignant neoplasm of breast: Secondary | ICD-10-CM

## 2016-08-17 DIAGNOSIS — I248 Other forms of acute ischemic heart disease: Secondary | ICD-10-CM | POA: Diagnosis present

## 2016-08-17 DIAGNOSIS — L89890 Pressure ulcer of other site, unstageable: Secondary | ICD-10-CM | POA: Diagnosis present

## 2016-08-17 DIAGNOSIS — Z931 Gastrostomy status: Secondary | ICD-10-CM

## 2016-08-17 DIAGNOSIS — E119 Type 2 diabetes mellitus without complications: Secondary | ICD-10-CM | POA: Diagnosis present

## 2016-08-17 DIAGNOSIS — E785 Hyperlipidemia, unspecified: Secondary | ICD-10-CM | POA: Diagnosis present

## 2016-08-17 DIAGNOSIS — Z7952 Long term (current) use of systemic steroids: Secondary | ICD-10-CM

## 2016-08-17 DIAGNOSIS — Z7982 Long term (current) use of aspirin: Secondary | ICD-10-CM | POA: Diagnosis not present

## 2016-08-17 DIAGNOSIS — J449 Chronic obstructive pulmonary disease, unspecified: Secondary | ICD-10-CM | POA: Diagnosis present

## 2016-08-17 DIAGNOSIS — L8990 Pressure ulcer of unspecified site, unspecified stage: Secondary | ICD-10-CM | POA: Insufficient documentation

## 2016-08-17 DIAGNOSIS — A419 Sepsis, unspecified organism: Secondary | ICD-10-CM

## 2016-08-17 DIAGNOSIS — J984 Other disorders of lung: Secondary | ICD-10-CM | POA: Diagnosis not present

## 2016-08-17 DIAGNOSIS — Z87891 Personal history of nicotine dependence: Secondary | ICD-10-CM | POA: Diagnosis not present

## 2016-08-17 DIAGNOSIS — M810 Age-related osteoporosis without current pathological fracture: Secondary | ICD-10-CM | POA: Diagnosis present

## 2016-08-17 DIAGNOSIS — J189 Pneumonia, unspecified organism: Secondary | ICD-10-CM | POA: Diagnosis present

## 2016-08-17 DIAGNOSIS — K5792 Diverticulitis of intestine, part unspecified, without perforation or abscess without bleeding: Secondary | ICD-10-CM | POA: Diagnosis present

## 2016-08-17 DIAGNOSIS — G9341 Metabolic encephalopathy: Secondary | ICD-10-CM | POA: Diagnosis present

## 2016-08-17 DIAGNOSIS — E039 Hypothyroidism, unspecified: Secondary | ICD-10-CM | POA: Diagnosis present

## 2016-08-17 DIAGNOSIS — Z91013 Allergy to seafood: Secondary | ICD-10-CM

## 2016-08-17 DIAGNOSIS — I2510 Atherosclerotic heart disease of native coronary artery without angina pectoris: Secondary | ICD-10-CM | POA: Diagnosis present

## 2016-08-17 DIAGNOSIS — Z6833 Body mass index (BMI) 33.0-33.9, adult: Secondary | ICD-10-CM

## 2016-08-17 DIAGNOSIS — I10 Essential (primary) hypertension: Secondary | ICD-10-CM | POA: Diagnosis present

## 2016-08-17 DIAGNOSIS — J690 Pneumonitis due to inhalation of food and vomit: Principal | ICD-10-CM | POA: Diagnosis present

## 2016-08-17 DIAGNOSIS — R32 Unspecified urinary incontinence: Secondary | ICD-10-CM | POA: Diagnosis present

## 2016-08-17 DIAGNOSIS — E43 Unspecified severe protein-calorie malnutrition: Secondary | ICD-10-CM | POA: Insufficient documentation

## 2016-08-17 DIAGNOSIS — R0603 Acute respiratory distress: Secondary | ICD-10-CM | POA: Diagnosis present

## 2016-08-17 DIAGNOSIS — Z794 Long term (current) use of insulin: Secondary | ICD-10-CM | POA: Diagnosis not present

## 2016-08-17 DIAGNOSIS — B59 Pneumocystosis: Secondary | ICD-10-CM | POA: Diagnosis not present

## 2016-08-17 DIAGNOSIS — Z23 Encounter for immunization: Secondary | ICD-10-CM | POA: Diagnosis not present

## 2016-08-17 DIAGNOSIS — I700 Atherosclerosis of aorta: Secondary | ICD-10-CM | POA: Diagnosis present

## 2016-08-17 DIAGNOSIS — L89150 Pressure ulcer of sacral region, unstageable: Secondary | ICD-10-CM | POA: Diagnosis present

## 2016-08-17 DIAGNOSIS — Z7401 Bed confinement status: Secondary | ICD-10-CM

## 2016-08-17 DIAGNOSIS — F329 Major depressive disorder, single episode, unspecified: Secondary | ICD-10-CM | POA: Diagnosis present

## 2016-08-17 DIAGNOSIS — E876 Hypokalemia: Secondary | ICD-10-CM | POA: Diagnosis present

## 2016-08-17 DIAGNOSIS — R159 Full incontinence of feces: Secondary | ICD-10-CM | POA: Diagnosis present

## 2016-08-17 LAB — COMPREHENSIVE METABOLIC PANEL
ALT: 35 U/L (ref 14–54)
ANION GAP: 10 (ref 5–15)
AST: 31 U/L (ref 15–41)
Albumin: 2.5 g/dL — ABNORMAL LOW (ref 3.5–5.0)
Alkaline Phosphatase: 187 U/L — ABNORMAL HIGH (ref 38–126)
BILIRUBIN TOTAL: 0.6 mg/dL (ref 0.3–1.2)
BUN: 55 mg/dL — AB (ref 6–20)
CO2: 33 mmol/L — ABNORMAL HIGH (ref 22–32)
Calcium: 9.7 mg/dL (ref 8.9–10.3)
Chloride: 96 mmol/L — ABNORMAL LOW (ref 101–111)
Creatinine, Ser: 0.71 mg/dL (ref 0.44–1.00)
Glucose, Bld: 408 mg/dL — ABNORMAL HIGH (ref 65–99)
POTASSIUM: 2.7 mmol/L — AB (ref 3.5–5.1)
Sodium: 139 mmol/L (ref 135–145)
TOTAL PROTEIN: 5.7 g/dL — AB (ref 6.5–8.1)

## 2016-08-17 LAB — CBC WITH DIFFERENTIAL/PLATELET
Basophils Absolute: 0 10*3/uL (ref 0–0.1)
Basophils Relative: 0 %
EOS PCT: 0 %
Eosinophils Absolute: 0 10*3/uL (ref 0–0.7)
HEMATOCRIT: 34.3 % — AB (ref 35.0–47.0)
HEMOGLOBIN: 11.1 g/dL — AB (ref 12.0–16.0)
LYMPHS ABS: 0.6 10*3/uL — AB (ref 1.0–3.6)
Lymphocytes Relative: 8 %
MCH: 32.1 pg (ref 26.0–34.0)
MCHC: 32.4 g/dL (ref 32.0–36.0)
MCV: 99.1 fL (ref 80.0–100.0)
MONOS PCT: 3 %
Monocytes Absolute: 0.2 10*3/uL (ref 0.2–0.9)
NEUTROS ABS: 7 10*3/uL — AB (ref 1.4–6.5)
Neutrophils Relative %: 89 %
PLATELETS: 215 10*3/uL (ref 150–440)
RBC: 3.46 MIL/uL — AB (ref 3.80–5.20)
RDW: 17.9 % — AB (ref 11.5–14.5)
WBC: 7.8 10*3/uL (ref 3.6–11.0)

## 2016-08-17 LAB — BLOOD GAS, ARTERIAL
Acid-Base Excess: 4.9 mmol/L — ABNORMAL HIGH (ref 0.0–2.0)
BICARBONATE: 29.2 mmol/L — AB (ref 20.0–28.0)
FIO2: 28
O2 Saturation: 96.7 %
PATIENT TEMPERATURE: 37
PH ART: 7.46 — AB (ref 7.350–7.450)
pCO2 arterial: 41 mmHg (ref 32.0–48.0)
pO2, Arterial: 83 mmHg (ref 83.0–108.0)

## 2016-08-17 LAB — URINALYSIS COMPLETE WITH MICROSCOPIC (ARMC ONLY)
BILIRUBIN URINE: NEGATIVE
Glucose, UA: >500 mg/dL — AB
Hgb urine dipstick: NEGATIVE
NITRITE: POSITIVE — AB
PH: 5 (ref 5.0–8.0)
Protein, ur: NEGATIVE mg/dL
SPECIFIC GRAVITY, URINE: 1.032 — AB (ref 1.005–1.030)
Squamous Epithelial / LPF: NONE SEEN

## 2016-08-17 LAB — CBC
HEMATOCRIT: 34.7 % — AB (ref 35.0–47.0)
HEMOGLOBIN: 11.1 g/dL — AB (ref 12.0–16.0)
MCH: 31.9 pg (ref 26.0–34.0)
MCHC: 32 g/dL (ref 32.0–36.0)
MCV: 99.7 fL (ref 80.0–100.0)
Platelets: 208 10*3/uL (ref 150–440)
RBC: 3.48 MIL/uL — AB (ref 3.80–5.20)
RDW: 18.1 % — ABNORMAL HIGH (ref 11.5–14.5)
WBC: 8 10*3/uL (ref 3.6–11.0)

## 2016-08-17 LAB — LACTIC ACID, PLASMA
Lactic Acid, Venous: 1.9 mmol/L (ref 0.5–1.9)
Lactic Acid, Venous: 1.3 mmol/L (ref 0.5–1.9)

## 2016-08-17 LAB — TROPONIN I: Troponin I: 0.06 ng/mL (ref <0.03)

## 2016-08-17 LAB — GLUCOSE, CAPILLARY
GLUCOSE-CAPILLARY: 478 mg/dL — AB (ref 65–99)
GLUCOSE-CAPILLARY: 466 mg/dL — AB (ref 65–99)
GLUCOSE-CAPILLARY: 423 mg/dL — AB (ref 65–99)

## 2016-08-17 LAB — C DIFFICILE QUICK SCREEN W PCR REFLEX
C DIFFICILE (CDIFF) TOXIN: NEGATIVE
C DIFFICLE (CDIFF) ANTIGEN: POSITIVE — AB

## 2016-08-17 LAB — CLOSTRIDIUM DIFFICILE BY PCR: Toxigenic C. Difficile by PCR: NEGATIVE

## 2016-08-17 LAB — POTASSIUM: POTASSIUM: 2.9 mmol/L — AB (ref 3.5–5.1)

## 2016-08-17 LAB — MAGNESIUM: MAGNESIUM: 1.6 mg/dL — AB (ref 1.7–2.4)

## 2016-08-17 LAB — BRAIN NATRIURETIC PEPTIDE: B Natriuretic Peptide: 162 pg/mL — ABNORMAL HIGH (ref 0.0–100.0)

## 2016-08-17 LAB — MRSA PCR SCREENING: MRSA BY PCR: NEGATIVE

## 2016-08-17 MED ORDER — PIPERACILLIN-TAZOBACTAM 4.5 G IVPB
4.5000 g | Freq: Three times a day (TID) | INTRAVENOUS | Status: DC
  Administered 2016-08-17 – 2016-08-18 (×3): 4.5 g via INTRAVENOUS
  Filled 2016-08-17 (×6): qty 100

## 2016-08-17 MED ORDER — MORPHINE SULFATE (CONCENTRATE) 10 MG/0.5ML PO SOLN: 10.0000 mg | ORAL | Status: DC | PRN

## 2016-08-17 MED ORDER — SIMVASTATIN 40 MG PO TABS
40.0000 mg | ORAL_TABLET | Freq: Every day | ORAL | Status: DC
  Administered 2016-08-18: 40 mg via ORAL
  Filled 2016-08-17: qty 1

## 2016-08-17 MED ORDER — ALBUTEROL SULFATE (2.5 MG/3ML) 0.083% IN NEBU
2.5000 mg | INHALATION_SOLUTION | RESPIRATORY_TRACT | Status: DC
Start: 2016-08-17 — End: 2016-08-18
  Administered 2016-08-17 – 2016-08-18 (×4): 2.5 mg via RESPIRATORY_TRACT
  Filled 2016-08-17 (×4): qty 3

## 2016-08-17 MED ORDER — VANCOMYCIN HCL IN DEXTROSE 1-5 GM/200ML-% IV SOLN
1000.0000 mg | INTRAVENOUS | Status: DC
  Administered 2016-08-18: 1000 mg via INTRAVENOUS
  Filled 2016-08-17 (×2): qty 200

## 2016-08-17 MED ORDER — SODIUM CHLORIDE 0.9 % IV BOLUS (SEPSIS)
1000.0000 mL | Freq: Once | INTRAVENOUS | Status: AC
  Administered 2016-08-17: 1000 mL via INTRAVENOUS

## 2016-08-17 MED ORDER — SODIUM CHLORIDE 0.9 % IV SOLN
INTRAVENOUS | Status: DC
  Administered 2016-08-17: 20:00:00 via INTRAVENOUS

## 2016-08-17 MED ORDER — INSULIN ASPART 100 UNIT/ML ~~LOC~~ SOLN: 0.0000 [IU] | Freq: Three times a day (TID) | SUBCUTANEOUS | Status: DC

## 2016-08-17 MED ORDER — INSULIN ASPART 100 UNIT/ML ~~LOC~~ SOLN: 3.0000 [IU] | Freq: Three times a day (TID) | SUBCUTANEOUS | Status: DC

## 2016-08-17 MED ORDER — LEVOTHYROXINE SODIUM 100 MCG PO TABS: 200.0000 ug | ORAL_TABLET | Freq: Every day | ORAL | Status: DC

## 2016-08-17 MED ORDER — LEVOTHYROXINE SODIUM 100 MCG PO TABS
200.0000 ug | ORAL_TABLET | Freq: Every day | ORAL | Status: DC
  Administered 2016-08-18 – 2016-08-19 (×2): 200 ug via ORAL
  Filled 2016-08-17 (×2): qty 2

## 2016-08-17 MED ORDER — GUAIFENESIN ER 600 MG PO TB12
600.0000 mg | ORAL_TABLET | Freq: Two times a day (BID) | ORAL | Status: DC
  Administered 2016-08-17 – 2016-08-19 (×4): 600 mg via ORAL
  Filled 2016-08-17 (×4): qty 1

## 2016-08-17 MED ORDER — INSULIN ASPART 100 UNIT/ML ~~LOC~~ SOLN: 0.0000 [IU] | Freq: Every day | SUBCUTANEOUS | Status: DC

## 2016-08-17 MED ORDER — PIPERACILLIN-TAZOBACTAM 3.375 G IVPB 30 MIN
3.3750 g | Freq: Once | INTRAVENOUS | Status: AC
  Administered 2016-08-17: 3.375 g via INTRAVENOUS
  Filled 2016-08-17: qty 50

## 2016-08-17 MED ORDER — FREE WATER
200.0000 mL | Freq: Three times a day (TID) | Status: DC
  Administered 2016-08-17 – 2016-08-18 (×2): 200 mL

## 2016-08-17 MED ORDER — ALBUTEROL SULFATE (2.5 MG/3ML) 0.083% IN NEBU: 2.5000 mg | INHALATION_SOLUTION | RESPIRATORY_TRACT | Status: DC | PRN

## 2016-08-17 MED ORDER — INSULIN ASPART 100 UNIT/ML ~~LOC~~ SOLN
4.0000 [IU] | SUBCUTANEOUS | Status: DC
  Administered 2016-08-17 – 2016-08-18 (×2): 4 [IU] via SUBCUTANEOUS
  Filled 2016-08-17 (×2): qty 4

## 2016-08-17 MED ORDER — BUDESONIDE 0.25 MG/2ML IN SUSP: 0.2500 mg | Freq: Two times a day (BID) | RESPIRATORY_TRACT | Status: DC

## 2016-08-17 MED ORDER — INSULIN DETEMIR 100 UNIT/ML ~~LOC~~ SOLN
40.0000 [IU] | Freq: Every day | SUBCUTANEOUS | Status: DC
  Administered 2016-08-17: 40 [IU] via SUBCUTANEOUS
  Filled 2016-08-17 (×2): qty 0.4

## 2016-08-17 MED ORDER — BUDESONIDE 0.25 MG/2ML IN SUSP
0.2500 mg | Freq: Two times a day (BID) | RESPIRATORY_TRACT | Status: DC
  Administered 2016-08-17 – 2016-08-18 (×2): 0.25 mg via RESPIRATORY_TRACT
  Filled 2016-08-17 (×2): qty 2

## 2016-08-17 MED ORDER — CLONAZEPAM 0.125 MG PO TBDP
0.2500 mg | ORAL_TABLET | Freq: Two times a day (BID) | ORAL | Status: DC
  Administered 2016-08-18 – 2016-08-19 (×4): 0.25 mg via ORAL
  Filled 2016-08-17 (×4): qty 2

## 2016-08-17 MED ORDER — VANCOMYCIN HCL IN DEXTROSE 1-5 GM/200ML-% IV SOLN
1000.0000 mg | Freq: Once | INTRAVENOUS | Status: AC
  Administered 2016-08-17: 1000 mg via INTRAVENOUS
  Filled 2016-08-17: qty 200

## 2016-08-17 MED ORDER — OSMOLITE 1.5 CAL PO LIQD
1000.0000 mL | ORAL | Status: DC
  Administered 2016-08-17: 711 mL

## 2016-08-17 MED ORDER — ONDANSETRON HCL 4 MG PO TABS: 4.0000 mg | ORAL_TABLET | Freq: Four times a day (QID) | ORAL | Status: DC | PRN

## 2016-08-17 MED ORDER — POTASSIUM CHLORIDE 10 MEQ/100ML IV SOLN
10.0000 meq | Freq: Once | INTRAVENOUS | Status: AC
  Administered 2016-08-17: 10 meq via INTRAVENOUS
  Filled 2016-08-17: qty 100

## 2016-08-17 MED ORDER — ENOXAPARIN SODIUM 40 MG/0.4ML ~~LOC~~ SOLN
40.0000 mg | SUBCUTANEOUS | Status: DC
  Administered 2016-08-17 – 2016-08-18 (×2): 40 mg via SUBCUTANEOUS
  Filled 2016-08-17 (×2): qty 0.4

## 2016-08-17 MED ORDER — POTASSIUM CHLORIDE 10 MEQ/100ML IV SOLN
10.0000 meq | INTRAVENOUS | Status: AC
  Administered 2016-08-17 – 2016-08-18 (×4): 10 meq via INTRAVENOUS
  Filled 2016-08-17 (×4): qty 100

## 2016-08-17 MED ORDER — INSULIN ASPART 100 UNIT/ML ~~LOC~~ SOLN
0.0000 [IU] | SUBCUTANEOUS | Status: DC
Start: 2016-08-17 — End: 2016-08-18
  Administered 2016-08-17 – 2016-08-18 (×2): 9 [IU] via SUBCUTANEOUS
  Administered 2016-08-18: 7 [IU] via SUBCUTANEOUS
  Filled 2016-08-17: qty 9
  Filled 2016-08-17: qty 7
  Filled 2016-08-17: qty 9

## 2016-08-17 MED ORDER — LEVOTHYROXINE SODIUM 50 MCG PO TABS: 50.0000 ug | ORAL_TABLET | Freq: Every day | ORAL | Status: DC

## 2016-08-17 MED ORDER — ASPIRIN EC 81 MG PO TBEC
81.0000 mg | DELAYED_RELEASE_TABLET | ORAL | Status: DC
  Administered 2016-08-18 – 2016-08-19 (×2): 81 mg via ORAL
  Filled 2016-08-17 (×2): qty 1

## 2016-08-17 MED ORDER — ONDANSETRON HCL 4 MG/2ML IJ SOLN: 4.0000 mg | Freq: Four times a day (QID) | INTRAMUSCULAR | Status: DC | PRN

## 2016-08-17 MED ORDER — TIOTROPIUM BROMIDE MONOHYDRATE 18 MCG IN CAPS
18.0000 ug | ORAL_CAPSULE | Freq: Every day | RESPIRATORY_TRACT | Status: DC
  Administered 2016-08-17 – 2016-08-19 (×2): 18 ug via RESPIRATORY_TRACT
  Filled 2016-08-17: qty 5

## 2016-08-17 MED ORDER — FAMOTIDINE 40 MG/5ML PO SUSR
20.0000 mg | Freq: Every day | ORAL | Status: DC
  Administered 2016-08-17 – 2016-08-19 (×3): 20 mg
  Filled 2016-08-17 (×3): qty 2.5

## 2016-08-17 MED ORDER — GABAPENTIN 100 MG PO CAPS
100.0000 mg | ORAL_CAPSULE | Freq: Every day | ORAL | Status: DC
  Administered 2016-08-17 – 2016-08-18 (×2): 100 mg via ORAL
  Filled 2016-08-17 (×2): qty 1

## 2016-08-17 MED ORDER — BUPROPION HCL ER (XL) 150 MG PO TB24
150.0000 mg | ORAL_TABLET | ORAL | Status: DC
  Administered 2016-08-18 – 2016-08-19 (×2): 150 mg via ORAL
  Filled 2016-08-17 (×2): qty 1

## 2016-08-17 MED ORDER — IOPAMIDOL (ISOVUE-300) INJECTION 61%
75.0000 mL | Freq: Once | INTRAVENOUS | Status: AC | PRN
  Administered 2016-08-17: 75 mL via INTRAVENOUS

## 2016-08-17 MED ORDER — LETROZOLE 2.5 MG PO TABS
2.5000 mg | ORAL_TABLET | Freq: Every day | ORAL | Status: DC
  Administered 2016-08-18 – 2016-08-19 (×2): 2.5 mg via ORAL
  Filled 2016-08-17 (×2): qty 1

## 2016-08-17 MED ORDER — ESCITALOPRAM OXALATE 10 MG PO TABS
20.0000 mg | ORAL_TABLET | Freq: Every day | ORAL | Status: DC
Start: 2016-08-17 — End: 2016-08-19
  Administered 2016-08-17 – 2016-08-19 (×3): 20 mg via ORAL
  Filled 2016-08-17 (×3): qty 2

## 2016-08-17 MED ORDER — SODIUM CHLORIDE 0.9% FLUSH
3.0000 mL | Freq: Two times a day (BID) | INTRAVENOUS | Status: DC
  Administered 2016-08-18 (×2): 3 mL via INTRAVENOUS

## 2016-08-17 MED ORDER — LEVOTHYROXINE SODIUM 50 MCG PO TABS
50.0000 ug | ORAL_TABLET | Freq: Every day | ORAL | Status: DC
  Administered 2016-08-18 – 2016-08-19 (×2): 50 ug via ORAL
  Filled 2016-08-17 (×2): qty 1

## 2016-08-17 MED ORDER — INSULIN ASPART 100 UNIT/ML ~~LOC~~ SOLN
10.0000 [IU] | Freq: Once | SUBCUTANEOUS | Status: AC
  Administered 2016-08-17: 20:00:00 10 [IU] via SUBCUTANEOUS
  Filled 2016-08-17: qty 10

## 2016-08-17 NOTE — H&P (Addendum)
[facility] [place] NAME: Ebony [name]    MR#:  [id number]  DATE OF BIRTH:  1946-07-03  DATE OF ADMISSION:  08/17/2016  PRIMARY CARE PHYSICIAN: Renee Rival, NP   REQUESTING/REFERRING PHYSICIAN:   CHIEF COMPLAINT:   Chief Complaint  Patient presents with  . Hypotension    HISTORY OF PRESENT ILLNESS: Ebony [name]  is a 70 y.o. female with a known history of Multiple medical problems including admission for cavitating pneumonia in August 2017 due to Pseudomonas infection, malnutrition, status post PEG tube placement, COPD, diabetes, breast cancer, hypertension, hyperlipidemia, hypothyroidism, diarrhea, who presents to the hospital with complaints of cough, yellow sputum. Sputum production for the past 2 weeks, worsening shortness of breath. Patient admits of feeling feverish and chilly, very weak over the past few weeks, she's been coughing and wheezing and is very short of breath. Prior to arrival to the hospital, patient was noted to be hypotensive with systolic blood pressure of 68 of a palpable, she was administered 1 L of IV fluids, her blood pressure has improved. On arrival to emergency room, the  patient had CT scan of her chest, revealing interval development of cavitary lesion and left lower lobe, persistent faint patchy densities in upper lobes bilaterally, coronary artery disease, atherosclerosis, gastrostomy tube. Hospitalist services were contacted for admission  PAST MEDICAL HISTORY:   Past Medical History:  Diagnosis Date  . Asthma   . Breast cancer ([place])   . Carpal tunnel syndrome   . COPD (chronic obstructive pulmonary disease) ([place])   . Depression   . Diabetes mellitus type 2, uncomplicated ([place])   . Hyperlipidemia   . Hypertension   . Hypothyroidism   . Osteoporosis, post-menopausal   . Psoriasis   . Sleep apnea   . Vitamin D deficiency     PAST SURGICAL HISTORY: Past Surgical History:  Procedure  Laterality Date  . BREAST EXCISIONAL BIOPSY Left 10/06/2015   np for surgery lumpectomy  . BREAST LUMPECTOMY WITH NEEDLE LOCALIZATION Left 10/06/2015   Procedure: BREAST LUMPECTOMY WITH NEEDLE LOCALIZATION;  Surgeon: Hubbard Robinson, MD;  Location: [facility] ORS;  Service: General;  Laterality: Left;  . carpal tunnel Right 1995  . Right heel spur removed    . SENTINEL NODE BIOPSY Left 10/06/2015   Procedure: SENTINEL NODE BIOPSY;  Surgeon: Hubbard Robinson, MD;  Location: [facility] ORS;  Service: General;  Laterality: Left;  . THUMB ARTHROSCOPY Left 2000   release of a nerve to the thumb.  . TRACHEOSTOMY TUBE PLACEMENT N/A 03/14/2016   Procedure: TRACHEOSTOMY;  Surgeon: Carloyn Manner, MD;  Location: [facility] ORS;  Service: ENT;  Laterality: N/A;  . TUBAL LIGATION      SOCIAL HISTORY:  Social History  Substance Use Topics  . Smoking status: Former Smoker    Packs/day: 0.50    Years: 30.00    Types: Cigarettes    Quit date: 10/05/2015  . Smokeless tobacco: Never Used  . Alcohol use No    FAMILY HISTORY:  Family History  Problem Relation Age of Onset  . Diabetes Mother   . Thyroid disease Mother   . COPD Father   . Emphysema Father   . Heart disease Brother   . Diabetes Brother   . Heart disease Brother   . Thyroid disease Daughter   . Thyroid disease Son     DRUG ALLERGIES:  Allergies  Allergen Reactions  . Other Hives and Rash    Shellfish.  Shellfish Pt reports no allergic reaction to iodine or betadine.  . Shellfish Allergy Hives and Rash    Review of Systems  Constitutional: Positive for chills, fever and malaise/fatigue. Negative for weight loss.  HENT: Negative for congestion.   Eyes: Negative for blurred vision and double vision.  Respiratory: Positive for cough, sputum production, shortness of breath and wheezing. Negative for hemoptysis.   Cardiovascular: Negative for chest pain, palpitations, orthopnea, leg swelling and PND.  Gastrointestinal: Negative for  abdominal pain, blood in stool, constipation, diarrhea, nausea and vomiting.  Genitourinary: Negative for dysuria, frequency, hematuria and urgency.  Musculoskeletal: Negative for falls.  Neurological: Negative for dizziness, tremors, focal weakness and headaches.  Endo/Heme/Allergies: Does not bruise/bleed easily.  Psychiatric/Behavioral: Negative for depression. The patient does not have insomnia.     MEDICATIONS AT HOME:  Prior to Admission medications   Medication Sig Start Date End Date Taking? Authorizing Provider  Adalimumab (HUMIRA PEN-PSORIASIS STARTER) 40 MG/0.8ML PNKT Inject 40 mg into the skin every 14 (fourteen) days.     Historical Provider, MD  albuterol (PROAIR HFA) 108 (90 BASE) MCG/ACT inhaler INHALE 2 PUFFS BY MOUTH EVERY 4 HOURS AS NEEDED FOR SHORTNESS OF BREATH AND/OR WHEEZING. 09/28/14   Historical Provider, MD  albuterol (PROVENTIL) (2.5 MG/3ML) 0.083% nebulizer solution Inhale 1 vial using nebulizer every six hours as needed for shortness of breath and/or wheezing.    Historical Provider, MD  aspirin EC 81 MG tablet Take 81 mg by mouth every morning.     Historical Provider, MD  BREO ELLIPTA 100-25 MCG/INH AEPB Inhale 2 puffs into the lungs daily. 05/25/16   Historical Provider, MD  budesonide-formoterol (SYMBICORT) 160-4.5 MCG/ACT inhaler INHALE 2 PUFFS BY MOUTH TWICE DAILY 09/15/14   Historical Provider, MD  buPROPion (WELLBUTRIN XL) 150 MG 24 hr tablet Take 150 mg by mouth every morning.     Historical Provider, MD  clobetasol (TEMOVATE) 0.05 % external solution Apply 1 application topically daily as needed (for psoriasis.).  02/15/16   Historical Provider, MD  clonazePAM (KLONOPIN) 0.25 MG disintegrating tablet Take 1 tablet by mouth every 12 (twelve) hours.  05/25/16   Historical Provider, MD  dexamethasone (DECADRON) 4 MG tablet Take 4 mg by mouth daily. 05/25/16   Historical Provider, MD  escitalopram (LEXAPRO) 20 MG tablet Take 20 mg by mouth daily. 05/25/16   Historical  Provider, MD  famotidine (PEPCID) 40 MG/5ML suspension Place 2.5 mLs (20 mg total) into feeding tube daily. 06/26/16   Henreitta Leber, MD  furosemide (LASIX) 20 MG tablet Take 20 mg by mouth 2 (two) times daily.    Historical Provider, MD  gabapentin (NEURONTIN) 100 MG capsule Take 100 mg by mouth at bedtime. 05/25/16   Historical Provider, MD  glipiZIDE (GLUCOTROL) 5 MG tablet Take 1 tablet by mouth daily before lunch.  08/14/15   Historical Provider, MD  HUMALOG 100 UNIT/ML injection  05/25/16   Historical Provider, MD  insulin aspart (NOVOLOG) 100 UNIT/ML injection Inject 4 Units into the skin every 4 (four) hours. 06/26/16   Henreitta Leber, MD  insulin detemir (LEVEMIR) 100 UNIT/ML injection Inject 40 Units into the skin at bedtime.    Historical Provider, MD  INVOKANA 100 MG TABS tablet Take 100 mg by mouth daily.  05/25/16   Historical Provider, MD  letrozole (FEMARA) 2.5 MG tablet Take 2.5 mg by mouth daily.    Historical Provider, MD  levothyroxine (SYNTHROID, LEVOTHROID) 200 MCG tablet Take 200 mcg by mouth daily before breakfast. *  Take along with 50 mcg to equal 250 mcg total dose*    Historical Provider, MD  levothyroxine (SYNTHROID, LEVOTHROID) 50 MCG tablet Take 50 mcg by mouth daily before breakfast. *Take along with 200 mcg tablet to equal 250 mcg total dose.*    Historical Provider, MD  losartan-hydrochlorothiazide (HYZAAR) 100-25 MG tablet Take 1 tablet by mouth daily. In am.    Historical Provider, MD  metoprolol (LOPRESSOR) 50 MG tablet Take 50 mg by mouth 2 (two) times daily. 05/25/16   Historical Provider, MD  Morphine Sulfate (MORPHINE CONCENTRATE) 10 MG/0.5ML SOLN concentrated solution Take 0.5-1 mLs (10-20 mg total) by mouth every hour as needed for severe pain. 06/26/16   Henreitta Leber, MD  Nutritional Supplements (FEEDING SUPPLEMENT, OSMOLITE 1.5 CAL,) LIQD Place 1,000 mLs into feeding tube continuous. 06/26/16   Henreitta Leber, MD  simvastatin (ZOCOR) 40 MG tablet Take 40 mg by  mouth daily at 6 PM.     Historical Provider, MD  SPIRIVA HANDIHALER 18 MCG inhalation capsule Place 18 mcg into inhaler and inhale daily.  05/25/16   Historical Provider, MD  Water For Irrigation, Sterile (FREE WATER) SOLN Place 200 mLs into feeding tube every 8 (eight) hours. 06/26/16   Henreitta Leber, MD      PHYSICAL EXAMINATION:   VITAL SIGNS: Blood pressure (!) 116/42, pulse (!) 104, temperature 97.4 F (36.3 C), temperature source Oral, resp. rate 19, weight 80.3 kg (177 lb), SpO2 100 %.  GENERAL:  70 y.o.-year-old patient lying in the bed In mild respiratory distress distress, coughing intermittently, however. Cough is insufficient, unable to lift up any phlegm, wheezing from the distance and choking on her own phlegm, uncomfortable, dyspneic, somewhat somnolent.  EYES: Pupils equal, round, reactive to light and accommodation. No scleral icterus. Extraocular muscles intact.  HEENT: Head atraumatic, normocephalic. Oropharynx and nasopharynx clear.  NECK:  Supple, no jugular venous distention. No thyroid enlargement, no tenderness.  LUNGS: Normal breath sounds bilaterally,  diffuse and wheezing, rales,rhonchi or crepitations all lung fields anteriorly. Using accessory muscles of respiration, coughing intermittently and choking on her own phlegm.  CARDIOVASCULAR: S1, S2 normal. No murmurs, rubs, or gallops.  ABDOMEN: Soft, tender diffusely, however, it appears to be due to superficial subcutaneous hematomas all over her abdominal wall, nondistended. Bowel sounds present, diminished. No organomegaly or mass. G-tube was noted in upper abdomen EXTREMITIES: No pedal edema, cyanosis, or clubbing.  NEUROLOGIC: Cranial nerves II through XII are intact. Muscle strength 5/5 in all extremities. Sensation intact. Gait not checked.  PSYCHIATRIC: The patient is somnolent, but able to open her eyes and conversed briefly, answers questions appropriately, follows commands, oriented x 3.  SKIN: No obvious  rash, lesion, or ulcer, except for superficial bruising in abdominal wall subcutaneous tissues.   LABORATORY PANEL:   CBC  Recent Labs Lab 08/17/16 1359  WBC 7.8  HGB 11.1*  HCT 34.3*  PLT 215  MCV 99.1  MCH 32.1  MCHC 32.4  RDW 17.9*  LYMPHSABS 0.6*  MONOABS 0.2  EOSABS 0.0  BASOSABS 0.0   ------------------------------------------------------------------------------------------------------------------  Chemistries   Recent Labs Lab 08/17/16 1359  NA 139  K 2.7*  CL 96*  CO2 33*  GLUCOSE 408*  BUN 55*  CREATININE 0.71  CALCIUM 9.7  AST 31  ALT 35  ALKPHOS 187*  BILITOT 0.6   ------------------------------------------------------------------------------------------------------------------  Cardiac Enzymes  Recent Labs Lab 08/17/16 1359  TROPONINI 0.06*   ------------------------------------------------------------------------------------------------------------------  RADIOLOGY: Dg Chest 2 View  Result Date: 08/17/2016  CLINICAL DATA:  Low blood pressure, uncooperative somewhat unresponsive patient, former smoking history EXAM: CHEST  2 VIEW COMPARISON:  Chest x-ray of 06/19/2016 FINDINGS: There is parenchymal opacity within the right mid upper lung most consistent with pneumonia. There is a small cavitary lesion in the left lower lobe with air-fluid level possibly and infected bullus. No pleural effusion is seen. The film is somewhat rotated. The heart is mildly enlarged and stable. IMPRESSION: 1. Patchy opacity in the right mid upper lung most consistent with pneumonia. 2. Cavitary lesion with thin walls in the left lower lobe with air-fluid level most consistent with an infected bullus. CT of the chest would be helpful if further assessment is warranted . Electronically Signed   By: Ivar Drape M.D.   On: 08/17/2016 14:36   Ct Chest W Contrast  Result Date: 08/17/2016 CLINICAL DATA:  Low blood pressure today. Pneumonia. Coughing for 2 weeks. EXAM: CT  CHEST WITH CONTRAST TECHNIQUE: Multidetector CT imaging of the chest was performed during intravenous contrast administration. CONTRAST:  5mL ISOVUE-300 IOPAMIDOL (ISOVUE-300) INJECTION 61% COMPARISON:  03/08/2016 FINDINGS: Cardiovascular: Heart size is upper normal. Coronary artery calcifications are present. Thoracic aorta is atherosclerotic. No aneurysm. Bovine arch anatomy noted. The pulmonary arteries are grossly well opacified. Mediastinum/Nodes: No mediastinal, hilar, or axillary adenopathy. Esophagus is normal. Lungs/Pleura: There has been significant improvement in aeration of the lungs since 03/08/2016. Within the left lower lobe there is a small cavitary lesion measuring 2.2 x 3.4 cm. This is a new finding since prior study. Multiple pulmonary infiltrates have resolved. Faint infiltrates persist in the upper lobes. Scattered calcified granulomata are present. A nodule in the right lower lobe is 5 x 6 mm on image 96 of series 3. More posteriorly in the right lower lobe, the density is 10 x 17 mm. A nodule in the lingula is 5 x 4 mm. Upper Abdomen: Patient has a gastrostomy tube. Gallbladder is present. Musculoskeletal: Visualized osseous structures have a normal appearance. IMPRESSION: 1. Interval development of cavitary lesion in the left lower lobe. 2. Persistent faint patchy densities in the upper lobes bilaterally. Findings favor infectious process. 3. Coronary artery disease. 4. Atherosclerosis of the thoracic aorta. 5. Gastrostomy tube. Electronically Signed   By: Nolon Nations M.D.   On: 08/17/2016 16:08    EKG: Orders placed or performed during the hospital encounter of 08/17/16  . ED EKG  . ED EKG  . EKG 12-Lead  . EKG 12-Lead  EKG in the emergency room revealed sinus tachycardia at 10 8 bpm, multiple premature complexes, ventricular and suprapubic supraventricular, S2 depressions in multiple leads, questionable global ischemia  IMPRESSION AND PLAN:  Active Problems:   Sepsis  ([place])   Healthcare-associated pneumonia   Pneumonia  #1. Sepsis, admit the patient to the medical floor, initiated on broad-spectrum antibiotic therapy, get blood cultures, urine cultures, sputum cultures, adjust antibiotics depending on culture result.  #2. Healthcare associated pneumonia, initiate vancomycin and Zosyn, get sputum cultures, percussion vest to help with expectorations, Humibid #3. Hypokalemia, get magnesium level, supplement potassium intravenously, follow potassium level closely #4. Metabolic encephalopathy, likely infection, hypotension related, get ABGs to rule out CO2 retention, patient was agreeable for BiPAP CPAP administration, if needed, but not intubation. #5 elevated troponin, likely demand ischemia, follow cardiac enzymes 3, no beta blockers, nitroglycerin due to hypotension, continue aspirin therapy, Lovenox #6. Patient is Limited code per her own request, discussed with her extensively in the presence of hospice nurse Santiago Glad    All  the records are reviewed and case discussed with ED provider. Management plans discussed with the patient, family and they are in agreement.  CODE STATUS:    Code Status Orders        Start     Ordered   08/17/16 1705  Limited resuscitation (code)  Continuous    Comments:  Discussed this patient in the emergency room today, hospice nurse Santiago Glad was present during interview  Question Answer Comment  In the event of cardiac or respiratory ARREST: Initiate Code Blue, Call Rapid Response Yes   In the event of cardiac or respiratory ARREST: Perform CPR No   In the event of cardiac or respiratory ARREST: Perform Intubation/Mechanical Ventilation No   In the event of cardiac or respiratory ARREST: Use NIPPV/BiPAp only if indicated Yes   In the event of cardiac or respiratory ARREST: Administer ACLS medications if indicated Yes   In the event of cardiac or respiratory ARREST: Perform Defibrillation or Cardioversion if indicated No       08/17/16 1705    Code Status History    Date Active Date Inactive Code Status Order ID Comments User Context   06/16/2016  9:54 AM 06/26/2016  1:21 PM Partial Code [id number]  Wilhelmina Mcardle, MD Inpatient   06/08/2016  4:16 AM 06/16/2016  9:53 AM Full Code [id number]  Harrie Foreman, MD Inpatient   02/27/2016 11:30 AM 03/22/2016  6:51 PM Full Code [id number]  Dustin Flock, MD ED   10/07/2015 11:30 AM 10/12/2015  5:35 PM Full Code [id number]  Theodoro Grist, MD Inpatient       TOTAL Critical care TIME TAKING CARE OF THIS PATIENT: 60 minutes.    Theodoro Grist M.D on 08/17/2016 at 5:11 PM  Between 7am to 6pm - Pager - [phone] After 6pm go to [email or web address] - password [email or web address] [place] [facility]  Office  [phone]  CC: Primary care physician; Renee Rival, NP

## 2016-08-17 NOTE — Progress Notes (Signed)
ED visit made. Ebony [name] is currently followed at [facility] by [facility] and [facility] with a hospice diagnosis of COPD, oxygen dependent. She is a Full Code. She was sent to the ED by request of her husband for assessment of low blood pressure and increased heart rate. Patient seen lying in bed, eyes closed, easily awakened, but very drowsy, able to answer questions with a very weak voice. Patient denied pain. Oxygen at 2 liters, sats 100%, heart rate 101, BP 112/50. Loose cough noted during visit. Chest xray and CT show a cavitary lesion in her left lung. Labs reviewed, potassium 2.7, blood glucose 408, troponin elevated at 0.6. Attending physician Dr. Ether Griffins at bedside, plan is for admission. Patient has received IV fluids and IV antibiotics as well as IV potassium. No family at bedside. Hospital care team made aware that patient is a current hospice patient. Will continue to follow and update hospice team. Thank you. Flo Shanks RN, BSN, [place] [facility] [place], [facility] [phone]

## 2016-08-17 NOTE — Progress Notes (Signed)
Pt on chronic foley due to pressure ulcers per family. Night RN notified to f/u about necessity of foley with MD.

## 2016-08-17 NOTE — Progress Notes (Signed)
Pharmacy Antibiotic Note  Ebony [name] is a 70 y.o. female admitted on 08/17/2016 with pneumonia/?HCAP.  Pharmacy has been consulted for Zosyn/Vancomycin dosing.  Patient from SNF - hospice following. Chest CT= cavitary lesion. Patient with Bronchial washings 06/10/16 that were + Pseudomonas aeurginosa.  Plan: Vancomycin 1 gram x 1 and Zosyn 3.375gm x 1 given in ER. Will continue with stacked dosing of Vancomycin 1 gram IV q18h. Ke= 0.049  T1/2 14  Vd 43.8.   TRough scheduled prior to 5th dose 10/8.  Will order Zosyn EI 4.5 gm IV q8h for hx of Pseudomonas- watch to de-escalate dose.    Height: 5\' 2"  (157.5 cm) Weight: 179 lb 3.2 oz (81.3 kg) IBW/kg (Calculated) : 50.1  Adj BW= 62.6 kg  Temp (24hrs), Avg:97.9 F (36.6 C), Min:97.4 F (36.3 C), Max:98.3 F (36.8 C)   Recent Labs Lab 08/17/16 1359  WBC 7.8  CREATININE 0.71  LATICACIDVEN 1.9    Estimated Creatinine Clearance: 66.5 mL/min (by C-G formula based on SCr of 0.71 mg/dL).    Allergies  Allergen Reactions  . Other Hives and Rash    Shellfish. Shellfish Pt reports no allergic reaction to iodine or betadine.  . Shellfish Allergy Hives and Rash    Antimicrobials this admission: Vanc 10/5>>   Zosyn 10/5 >>    Dose adjustments this admission:    Microbiology results: 10/5 BCx: P 10/5 UCx: P    Sputum:    10/5 MRSA PCR: P  10/5 Cdiff P  Thank you for allowing pharmacy to be a part of this patient's care.  Tomesha Sargent A 08/17/2016 7:13 PM

## 2016-08-17 NOTE — ED Provider Notes (Addendum)
[facility] Emergency Department Provider Note  ____________________________________________   I have reviewed the triage vital signs and the nursing notes.   HISTORY  Chief Complaint Hypotension    HPI Ebony [name] is a 70 y.o. female presents today with a reported low blood pressure here her blood pressure is 90/66. Patient has a history ofG-tube, breast cancer and was apparently trached recently for a straight failure. She states she's had URI symptoms including cough which is productive and subjective fevers at the nursing home. According to patient and EMS she was started on ciprofloxacin for this. However, the symptoms persisted and they noted her blood pressure was low today.      Past Medical History:  Diagnosis Date  . Asthma   . Breast cancer ([place])   . Carpal tunnel syndrome   . COPD (chronic obstructive pulmonary disease) ([place])   . Depression   . Diabetes mellitus type 2, uncomplicated ([place])   . Hyperlipidemia   . Hypertension   . Hypothyroidism   . Osteoporosis, post-menopausal   . Psoriasis   . Sleep apnea   . Vitamin D deficiency     Patient Active Problem List   Diagnosis Date Noted  . Diverticulitis of colon 08/14/2016  . Hypoglycemic shock ([place]) 07/31/2016  . Depression 07/31/2016  . Chronic obstructive pulmonary disease with acute lower resp infection ([place]) 07/23/2016  . Diabetes mellitus ([place]) 07/19/2016  . Generalized anxiety disorder 07/10/2016  . Hypotension 06/08/2016  . Pressure ulcer 03/18/2016  . Problems with swallowing and mastication   . COPD with exacerbation ([place])   . Mild malnutrition ([place])   . History of infection not responsive to antibiotic therapy   . Acute respiratory failure ([place]) 02/27/2016  . Aftercare following surgery   . Acute respiratory failure with hypoxia ([place]) 10/07/2015  . COPD exacerbation ([place]) 10/07/2015  . Community acquired pneumonia 10/07/2015  . Hyperkalemia 10/07/2015  .  Breast cancer, stage 1 ([place])   . Cancer of left female breast  ([place]) 09/09/2015  . Mild chronic obstructive pulmonary disease ([place]) 06/25/2014  . Inflamed nasal mucosa 06/25/2014  . Apnea, sleep 06/25/2014    Past Surgical History:  Procedure Laterality Date  . BREAST EXCISIONAL BIOPSY Left 10/06/2015   np for surgery lumpectomy  . BREAST LUMPECTOMY WITH NEEDLE LOCALIZATION Left 10/06/2015   Procedure: BREAST LUMPECTOMY WITH NEEDLE LOCALIZATION;  Surgeon: Hubbard Robinson, MD;  Location: [facility] ORS;  Service: General;  Laterality: Left;  . carpal tunnel Right 1995  . Right heel spur removed    . SENTINEL NODE BIOPSY Left 10/06/2015   Procedure: SENTINEL NODE BIOPSY;  Surgeon: Hubbard Robinson, MD;  Location: [facility] ORS;  Service: General;  Laterality: Left;  . THUMB ARTHROSCOPY Left 2000   release of a nerve to the thumb.  . TRACHEOSTOMY TUBE PLACEMENT N/A 03/14/2016   Procedure: TRACHEOSTOMY;  Surgeon: Carloyn Manner, MD;  Location: [facility] ORS;  Service: ENT;  Laterality: N/A;  . TUBAL LIGATION      Prior to Admission medications   Medication Sig Start Date End Date Taking? Authorizing Provider  Adalimumab (HUMIRA PEN-PSORIASIS STARTER) 40 MG/0.8ML PNKT Inject 40 mg into the skin every 14 (fourteen) days.     Historical Provider, MD  albuterol (PROAIR HFA) 108 (90 BASE) MCG/ACT inhaler INHALE 2 PUFFS BY MOUTH EVERY 4 HOURS AS NEEDED FOR SHORTNESS OF BREATH AND/OR WHEEZING. 09/28/14   Historical Provider, MD  albuterol (PROVENTIL) (2.5 MG/3ML) 0.083% nebulizer solution Inhale 1 vial using nebulizer  every six hours as needed for shortness of breath and/or wheezing.    Historical Provider, MD  aspirin EC 81 MG tablet Take 81 mg by mouth every morning.     Historical Provider, MD  BREO ELLIPTA 100-25 MCG/INH AEPB Inhale 2 puffs into the lungs daily. 05/25/16   Historical Provider, MD  budesonide-formoterol (SYMBICORT) 160-4.5 MCG/ACT inhaler INHALE 2 PUFFS BY MOUTH TWICE DAILY 09/15/14    Historical Provider, MD  buPROPion (WELLBUTRIN XL) 150 MG 24 hr tablet Take 150 mg by mouth every morning.     Historical Provider, MD  clobetasol (TEMOVATE) 0.05 % external solution Apply 1 application topically daily as needed (for psoriasis.).  02/15/16   Historical Provider, MD  clonazePAM (KLONOPIN) 0.25 MG disintegrating tablet Take 1 tablet by mouth every 12 (twelve) hours.  05/25/16   Historical Provider, MD  dexamethasone (DECADRON) 4 MG tablet Take 4 mg by mouth daily. 05/25/16   Historical Provider, MD  escitalopram (LEXAPRO) 20 MG tablet Take 20 mg by mouth daily. 05/25/16   Historical Provider, MD  famotidine (PEPCID) 40 MG/5ML suspension Place 2.5 mLs (20 mg total) into feeding tube daily. 06/26/16   Henreitta Leber, MD  furosemide (LASIX) 20 MG tablet Take 20 mg by mouth 2 (two) times daily.    Historical Provider, MD  gabapentin (NEURONTIN) 100 MG capsule Take 100 mg by mouth at bedtime. 05/25/16   Historical Provider, MD  glipiZIDE (GLUCOTROL) 5 MG tablet Take 1 tablet by mouth daily before lunch.  08/14/15   Historical Provider, MD  HUMALOG 100 UNIT/ML injection  05/25/16   Historical Provider, MD  insulin aspart (NOVOLOG) 100 UNIT/ML injection Inject 4 Units into the skin every 4 (four) hours. 06/26/16   Henreitta Leber, MD  insulin detemir (LEVEMIR) 100 UNIT/ML injection Inject 40 Units into the skin at bedtime.    Historical Provider, MD  INVOKANA 100 MG TABS tablet  05/25/16   Historical Provider, MD  letrozole [facility]) 2.5 MG tablet Take 2.5 mg by mouth daily.    Historical Provider, MD  levothyroxine (SYNTHROID, LEVOTHROID) 200 MCG tablet Take 200 mcg by mouth daily before breakfast. *Take along with 50 mcg to equal 250 mcg total dose*    Historical Provider, MD  levothyroxine (SYNTHROID, LEVOTHROID) 50 MCG tablet Take 50 mcg by mouth daily before breakfast. *Take along with 200 mcg tablet to equal 250 mcg total dose.*    Historical Provider, MD  losartan-hydrochlorothiazide (HYZAAR)  100-25 MG tablet Take 1 tablet by mouth daily. In am.    Historical Provider, MD  metoprolol (LOPRESSOR) 50 MG tablet Take 50 mg by mouth 2 (two) times daily. 05/25/16   Historical Provider, MD  Morphine Sulfate (MORPHINE CONCENTRATE) 10 MG/0.5ML SOLN concentrated solution Take 0.5-1 mLs (10-20 mg total) by mouth every hour as needed for severe pain. 06/26/16   Henreitta Leber, MD  Nutritional Supplements (FEEDING SUPPLEMENT, OSMOLITE 1.5 CAL,) LIQD Place 1,000 mLs into feeding tube continuous. 06/26/16   Henreitta Leber, MD  simvastatin (ZOCOR) 40 MG tablet Take 40 mg by mouth daily at 6 PM.     Historical Provider, MD  [facility] HANDIHALER 18 MCG inhalation capsule  05/25/16   Historical Provider, MD  Water For Irrigation, Sterile (FREE WATER) SOLN Place 200 mLs into feeding tube every 8 (eight) hours. 06/26/16   Henreitta Leber, MD    Allergies Other and Shellfish allergy  Family History  Problem Relation Age of Onset  . Diabetes Mother   .  Thyroid disease Mother   . COPD Father   . Emphysema Father   . Heart disease Brother   . Diabetes Brother   . Heart disease Brother   . Thyroid disease Daughter   . Thyroid disease Son     Social History Social History  Substance Use Topics  . Smoking status: Former Smoker    Packs/day: 0.50    Years: 30.00    Types: Cigarettes    Quit date: 10/05/2015  . Smokeless tobacco: Never Used  . Alcohol use No    Review of Systems }Constitutional: Low-grade fever/chills Eyes: No visual changes. ENT: No sore throat. No stiff neck no neck pain Cardiovascular: Denies chest pain. Respiratory: Denies shortness of breath. Gastrointestinal:   no vomiting.  No diarrhea.  No constipation. Genitourinary: Negative for dysuria. Musculoskeletal: Negative lower extremity swelling Skin: Negative for rash. Neurological: Negative for severe headaches, focal weakness or numbness. 10-point ROS otherwise  negative.  ____________________________________________   PHYSICAL EXAM:  VITAL SIGNS: ED Triage Vitals  Enc Vitals Group     BP 08/17/16 1347 98/66     Pulse Rate 08/17/16 1347 (!) 110     Resp 08/17/16 1347 18     Temp 08/17/16 1352 97.4 F (36.3 C)     Temp Source 08/17/16 1352 Oral     SpO2 08/17/16 1347 99 %     Weight 08/17/16 1348 177 lb (80.3 kg)     Height --      Head Circumference --      Peak Flow --      Pain Score --      Pain Loc --      Pain Edu? --      Excl. in [place]? --     Constitutional: Patient appears chronically ill she is not in acute distress at this moment  Eyes: Conjunctivae are normal. PERRL. EOMI. Head: Atraumatic. Nose: No congestion/rhinnorhea. Mouth/Throat: Mucous membranes are moist.  Oropharynx non-erythematous. Neck: No stridor.   Nontender with no meningismus Cardiovascular: Normal rate, regular rhythm. Grossly normal heart sounds.  Good peripheral circulation. Respiratory: Occasional Rales and rhonchi appreciated more on the left than the right Abdominal: Soft and nontender. No distention. No guarding no rebound Back:  There is no focal tenderness or step off.  there is no midline tenderness there are no lesions noted. there is no CVA tenderness Musculoskeletal: No tenderness noted sores in appearance Neurologic:  Normal speech and language. No gross focal neurologic deficits are appreciated.  Skin:  Patient with a history of decubitus did not roll her at this time given acuity of presentation   ____________________________________________   LABS (all labs ordered are listed, but only abnormal results are displayed)  Labs Reviewed  CBC WITH DIFFERENTIAL/PLATELET - Abnormal; Notable for the following:       Result Value   RBC 3.46 (*)    Hemoglobin 11.1 (*)    HCT 34.3 (*)    RDW 17.9 (*)    Neutro Abs 7.0 (*)    Lymphs Abs 0.6 (*)    All other components within normal limits  COMPREHENSIVE METABOLIC PANEL - Abnormal; Notable  for the following:    Potassium 2.7 (*)    Chloride 96 (*)    CO2 33 (*)    Glucose, Bld 408 (*)    BUN 55 (*)    Total Protein 5.7 (*)    Albumin 2.5 (*)    Alkaline Phosphatase 187 (*)    All other components  within normal limits  TROPONIN I - Abnormal; Notable for the following:    Troponin I 0.06 (*)    All other components within normal limits  URINE CULTURE  CULTURE, BLOOD (ROUTINE X 2)  CULTURE, BLOOD (ROUTINE X 2)  LACTIC ACID, PLASMA  LACTIC ACID, PLASMA  URINALYSIS COMPLETEWITH MICROSCOPIC ([facility] ONLY)  BRAIN NATRIURETIC PEPTIDE   ____________________________________________  EKG  I personally interpreted any EKGs ordered by me or triage Sinus tachycardia rate 108 and acute ST elevation, diffuse ST depression noted in multiple different areas, normal axis. ____________________________________________  [id number]  I reviewed any imaging ordered by me or triage that were performed during my shift and, if possible, patient and/or family made aware of any abnormal findings. ____________________________________________   PROCEDURES  Procedure(s) performed: None  Procedures  Critical Care performed: CRITICAL CARE Performed by: Schuyler Amor   Total critical care time: 40 minutes  Critical care time was exclusive of separately billable procedures and treating other patients.  Critical care was necessary to treat or prevent imminent or life-threatening deterioration.  Critical care was time spent personally by me on the following activities: development of treatment plan with patient and/or surrogate as well as nursing, discussions with consultants, evaluation of patient's response to treatment, examination of patient, obtaining history from patient or surrogate, ordering and performing treatments and interventions, ordering and review of laboratory studies, ordering and review of radiographic studies, pulse oximetry and re-evaluation of patient's  condition.   ____________________________________________   INITIAL IMPRESSION / ASSESSMENT AND PLAN / ED COURSE  Pertinent labs & imaging results that were available during my care of the patient were reviewed by me and considered in my medical decision making (see chart for details).  Patient with hypotension, lactic is negative temperature is not febrile mild tachycardia noted, patient however does have what appears to be cavitary lesion on CT we have given her respiratory antibiotics, IV fluids, gingerly given history of CHF and the family at this time is debating whether they wish her to be intubated or not. Blood pressures followed very well to the first bolus she is not hypotensive here. Does not meet criteria for severe sepsis and therefore we will not give her multiple liters of emergent fluid as I think that at this time might present with some complications.  ----------------------------------------- 4:37 PM on 08/17/2016 ----------------------------------------- \Recent bronchial wash shows that she had pseudomonas susceptible to Cipro which is maybe why they started her on Cipro. In any event, patient for now as a full code, I discussed with the hospitalist service agree with management at this time and will admit.  ----------------------------------------- 5:03 PM on 08/17/2016 -----------------------------------------  Family now reveals the patient is hospice. The patient CODE STATUS has been discussed with me as well as the hospitalist and they're deciding what they would like to do.  Clinical Course   ____________________________________________   FINAL CLINICAL IMPRESSION(S) / ED DIAGNOSES  Final diagnoses:  None      This chart was dictated using voice recognition software.  Despite best efforts to proofread,  errors can occur which can change meaning.      Schuyler Amor, MD 08/17/16 1633    Schuyler Amor, MD 08/17/16 [place],  MD 08/17/16 [phone]

## 2016-08-17 NOTE — ED Notes (Signed)
Patient transported to X-ray 

## 2016-08-17 NOTE — Progress Notes (Signed)
Notified Dr Ether Griffins that pt's CBG 478, after rechecked 466. Also notified MD of pt having loose stools and pressure ulcers. Per MD to order 10units novolog once, check CBG after 2hrs and call MD with results. Also to order enteric precautions, check for c diff and wound consult.

## 2016-08-17 NOTE — ED Triage Notes (Signed)
Sent in from [facility] with low b/p today  Per ems b/p was 68/p   Was given 147ml bolus   PTA

## 2016-08-17 NOTE — ED Notes (Signed)
Informed RN bed ready 

## 2016-08-18 DIAGNOSIS — B59 Pneumocystosis: Secondary | ICD-10-CM

## 2016-08-18 DIAGNOSIS — E43 Unspecified severe protein-calorie malnutrition: Secondary | ICD-10-CM | POA: Insufficient documentation

## 2016-08-18 DIAGNOSIS — A419 Sepsis, unspecified organism: Secondary | ICD-10-CM

## 2016-08-18 DIAGNOSIS — J189 Pneumonia, unspecified organism: Secondary | ICD-10-CM

## 2016-08-18 DIAGNOSIS — L8990 Pressure ulcer of unspecified site, unspecified stage: Secondary | ICD-10-CM | POA: Insufficient documentation

## 2016-08-18 LAB — GLUCOSE, CAPILLARY
GLUCOSE-CAPILLARY: 164 mg/dL — AB (ref 65–99)
GLUCOSE-CAPILLARY: 347 mg/dL — AB (ref 65–99)
GLUCOSE-CAPILLARY: 386 mg/dL — AB (ref 65–99)
Glucose-Capillary: 371 mg/dL — ABNORMAL HIGH (ref 65–99)
Glucose-Capillary: 272 mg/dL — ABNORMAL HIGH (ref 65–99)

## 2016-08-18 LAB — BASIC METABOLIC PANEL
ANION GAP: 11 (ref 5–15)
BUN: 37 mg/dL — ABNORMAL HIGH (ref 6–20)
CALCIUM: 9.4 mg/dL (ref 8.9–10.3)
CHLORIDE: 104 mmol/L (ref 101–111)
CO2: 27 mmol/L (ref 22–32)
Creatinine, Ser: 0.53 mg/dL (ref 0.44–1.00)
GFR calc Af Amer: >60 mL/min (ref >60)
GFR calc non Af Amer: >60 mL/min (ref >60)
GLUCOSE: 343 mg/dL — AB (ref 65–99)
POTASSIUM: 2.9 mmol/L — AB (ref 3.5–5.1)
Sodium: 142 mmol/L (ref 135–145)

## 2016-08-18 LAB — CBC
HEMATOCRIT: 30.3 % — AB (ref 35.0–47.0)
HEMOGLOBIN: 10.3 g/dL — AB (ref 12.0–16.0)
MCH: 33.5 pg (ref 26.0–34.0)
MCHC: 34 g/dL (ref 32.0–36.0)
MCV: 98.4 fL (ref 80.0–100.0)
Platelets: 190 10*3/uL (ref 150–440)
RBC: 3.08 MIL/uL — ABNORMAL LOW (ref 3.80–5.20)
RDW: 17.5 % — ABNORMAL HIGH (ref 11.5–14.5)
WBC: 7.2 10*3/uL (ref 3.6–11.0)

## 2016-08-18 LAB — TROPONIN I
Troponin I: 0.04 ng/mL (ref <0.03)
Troponin I: 0.05 ng/mL (ref <0.03)

## 2016-08-18 LAB — PROCALCITONIN: PROCALCITONIN: 0.16 ng/mL

## 2016-08-18 MED ORDER — DEXAMETHASONE 4 MG PO TABS
4.0000 mg | ORAL_TABLET | Freq: Every day | ORAL | Status: DC
  Administered 2016-08-18 – 2016-08-19 (×2): 4 mg via ORAL
  Filled 2016-08-18 (×3): qty 1

## 2016-08-18 MED ORDER — ACETAMINOPHEN 325 MG PO TABS
650.0000 mg | ORAL_TABLET | Freq: Four times a day (QID) | ORAL | Status: DC | PRN
Start: 2016-08-18 — End: 2016-08-19

## 2016-08-18 MED ORDER — INSULIN ASPART 100 UNIT/ML ~~LOC~~ SOLN
0.0000 [IU] | Freq: Three times a day (TID) | SUBCUTANEOUS | Status: DC
  Administered 2016-08-18: 20 [IU] via SUBCUTANEOUS
  Administered 2016-08-18: 11 [IU] via SUBCUTANEOUS
  Administered 2016-08-19: 3 [IU] via SUBCUTANEOUS
  Administered 2016-08-19: 20 [IU] via SUBCUTANEOUS
  Filled 2016-08-18: qty 20
  Filled 2016-08-18: qty 3
  Filled 2016-08-18: qty 11
  Filled 2016-08-18: qty 20

## 2016-08-18 MED ORDER — INSULIN DETEMIR 100 UNIT/ML ~~LOC~~ SOLN
50.0000 [IU] | Freq: Every day | SUBCUTANEOUS | Status: DC
  Administered 2016-08-18: 50 [IU] via SUBCUTANEOUS
  Filled 2016-08-18 (×2): qty 0.5

## 2016-08-18 MED ORDER — DIGOXIN 0.25 MG/ML IJ SOLN
0.5000 mg | Freq: Once | INTRAMUSCULAR | Status: AC
Start: 2016-08-18 — End: 2016-08-18
  Administered 2016-08-18: 19:00:00 0.5 mg via INTRAVENOUS
  Filled 2016-08-18: qty 2

## 2016-08-18 MED ORDER — MAGNESIUM SULFATE 4 GM/100ML IV SOLN
4.0000 g | Freq: Once | INTRAVENOUS | Status: AC
  Administered 2016-08-18: 16:00:00 4 g via INTRAVENOUS
  Filled 2016-08-18: qty 100

## 2016-08-18 MED ORDER — DAKINS (1/4 STRENGTH) 0.125 % EX SOLN
Freq: Every day | CUTANEOUS | Status: DC
  Administered 2016-08-18 – 2016-08-19 (×2)
  Filled 2016-08-18: qty 473

## 2016-08-18 MED ORDER — INFLUENZA VAC SPLIT QUAD 0.5 ML IM SUSY
0.5000 mL | PREFILLED_SYRINGE | INTRAMUSCULAR | Status: AC
  Administered 2016-08-19: 08:00:00 0.5 mL via INTRAMUSCULAR
  Filled 2016-08-18: qty 0.5

## 2016-08-18 MED ORDER — PRO-STAT SUGAR FREE PO LIQD: 30.0000 mL | Freq: Three times a day (TID) | ORAL | Status: DC

## 2016-08-18 MED ORDER — OSMOLITE 1.5 CAL PO LIQD
237.0000 mL | Freq: Four times a day (QID) | ORAL | Status: DC
  Administered 2016-08-18 – 2016-08-19 (×4): 237 mL

## 2016-08-18 MED ORDER — MORPHINE SULFATE (CONCENTRATE) 10 MG/0.5ML PO SOLN
10.0000 mg | ORAL | Status: DC | PRN
  Administered 2016-08-18: 16:00:00 20 mg via ORAL
  Filled 2016-08-18: qty 1

## 2016-08-18 MED ORDER — INSULIN ASPART 100 UNIT/ML ~~LOC~~ SOLN: 0.0000 [IU] | SUBCUTANEOUS | Status: DC

## 2016-08-18 MED ORDER — METRONIDAZOLE 500 MG PO TABS
500.0000 mg | ORAL_TABLET | Freq: Three times a day (TID) | ORAL | Status: DC
  Administered 2016-08-18 – 2016-08-19 (×4): 500 mg via ORAL
  Filled 2016-08-18 (×4): qty 1

## 2016-08-18 MED ORDER — INSULIN ASPART 100 UNIT/ML ~~LOC~~ SOLN
4.0000 [IU] | SUBCUTANEOUS | Status: DC
  Administered 2016-08-18 – 2016-08-19 (×7): 4 [IU] via SUBCUTANEOUS
  Filled 2016-08-18 (×7): qty 4

## 2016-08-18 MED ORDER — IPRATROPIUM-ALBUTEROL 0.5-2.5 (3) MG/3ML IN SOLN
3.0000 mL | RESPIRATORY_TRACT | Status: DC
  Administered 2016-08-18 – 2016-08-19 (×7): 3 mL via RESPIRATORY_TRACT
  Filled 2016-08-18 (×7): qty 3

## 2016-08-18 MED ORDER — POTASSIUM CHLORIDE 20 MEQ PO PACK
40.0000 meq | PACK | Freq: Two times a day (BID) | ORAL | Status: DC
  Administered 2016-08-18 – 2016-08-19 (×3): 40 meq via ORAL
  Filled 2016-08-18 (×3): qty 2

## 2016-08-18 MED ORDER — CIPROFLOXACIN HCL 500 MG PO TABS
500.0000 mg | ORAL_TABLET | Freq: Two times a day (BID) | ORAL | Status: DC
  Administered 2016-08-18 – 2016-08-19 (×2): 500 mg via ORAL
  Filled 2016-08-18 (×2): qty 1

## 2016-08-18 MED ORDER — PRO-STAT SUGAR FREE PO LIQD
30.0000 mL | Freq: Three times a day (TID) | ORAL | Status: DC
  Administered 2016-08-18 – 2016-08-19 (×4): 30 mL

## 2016-08-18 MED ORDER — BUDESONIDE 0.5 MG/2ML IN SUSP: 0.5000 mg | Freq: Two times a day (BID) | RESPIRATORY_TRACT | Status: DC

## 2016-08-18 MED ORDER — MOMETASONE FURO-FORMOTEROL FUM 200-5 MCG/ACT IN AERO
2.0000 | INHALATION_SPRAY | Freq: Two times a day (BID) | RESPIRATORY_TRACT | Status: DC
  Administered 2016-08-18 – 2016-08-19 (×3): 2 via RESPIRATORY_TRACT
  Filled 2016-08-18: qty 8.8

## 2016-08-18 NOTE — Consult Note (Signed)
[place] Nurse wound consult note Reason for Consult:UNatageable pressure injury to sacrum and perirectal area (incontinence associated dermatitis) (IAD). Wound type:Ful thickness injury Pressure Ulcer POA: Yes Measurement:Sacrum 6 cm x 5 cm x 3 cm with wound bed 100% thin adherent slough.  Will use Dakin's for debridement.  ALbumin 2.5  2 cm x 2 cm x 0.2 cm denuded skin to proximal skin near rectum.  Incontinent of bowel, has foley in place.  Heels are offloaded and Low air loss mattress has been ordered. Wound EZ:[id number] tissue.  Drainage (amount, consistency, odor) Moderate serosanguinous.   Periwound:Blanchable erythema Dressing procedure/placement/frequency:Cleanse sacral wound with NS and pat gently dry.  Gently fill wound depth with Dakin's moistened gauze.  Cover with ABD pad and tape.  Change daily.   Barrier cream and prompt incontinence to perirectal breakdown.  Will not follow at this time.  Please re-consult if needed.  Domenic Moras RN BSN [place] Pager [phone]

## 2016-08-18 NOTE — Progress Notes (Signed)
Pt with several episodes of SVT with HR in high 180's.  D/w [facility] and stat EKG obtained earlier.  Trop 0.04.  Pt asymptomatic.  Currently sleeping quietly.  Text paged [facility] with new run of SVT. Dorna Bloom RN

## 2016-08-18 NOTE — Progress Notes (Signed)
Location:      Place of Service:  SNF (31) Provider:  Toni Arthurs, NP-C  Renee Rival, NP  Patient Care Team: Renee Rival, NP as PCP - General (Nurse Practitioner)  Extended Emergency Contact Information Primary Emergency Contact: [name] Address: [address]          [place], [place] [address] [name] of [place] Phone: [phone] Relation: Spouse Secondary Emergency Contact: [name] [place] of [place] Phone: [phone] Mobile Phone: [phone] Relation: Daughter  Code Status:  full Goals of care: Advanced Directive information Advanced Directives 08/17/2016  Does patient have an advance directive? No  Would patient like information on creating an advanced directive? No - patient declined information     Chief Complaint  Patient presents with  . Acute Visit    HPI:  Pt is a 70 y.o. female seen today for an urgent visit. I    was called to the room by hospice RN for evaluation. This morning, patient is significantly different than yesterday. Patient is lethargic, difficult to arouse. Slow to respond to questions. She is hypotensive, and tachycardic. Respirations andd O2 SATs normal. Afebrile. Patient denies chest pain. Patient denies dyspnea. Patient denies N / V / D / f / C / CP/ sob/ha / ABD pain/ dizziness. She says she doesn't know what's wrong, can't put her finger on it, but just doesn't feel good. At this time, I discussed with her the need to send her to the hospital. She was very hesitant at first, saying she didn't really want to go. I then began to discuss with her her code status. I informed her that she is a very sick lady, and that if she did not want to go to the hospital, we needed to reconsider her code status of a full code, and need to change to DNR. Meanwhile, hospice RN was attempting to contact her family without success. Within just a few minutes, patients husband arrived for his visit. We updated  him on her status. He discussed Mrs Yowell' situation and her wishes with her, and together they decided for her to go to the hospital. EMS was then contacted for transportation to the ED.  Past Medical History:  Diagnosis Date  . Asthma   . Breast cancer ([place])   . Carpal tunnel syndrome   . COPD (chronic obstructive pulmonary disease) ([place])   . Depression   . Diabetes mellitus type 2, uncomplicated ([place])   . Hyperlipidemia   . Hypertension   . Hypothyroidism   . Osteoporosis, post-menopausal   . Psoriasis   . Sleep apnea   . Vitamin D deficiency    Past Surgical History:  Procedure Laterality Date  . BREAST EXCISIONAL BIOPSY Left 10/06/2015   np for surgery lumpectomy  . BREAST LUMPECTOMY WITH NEEDLE LOCALIZATION Left 10/06/2015   Procedure: BREAST LUMPECTOMY WITH NEEDLE LOCALIZATION;  Surgeon: Hubbard Robinson, MD;  Location: [facility] ORS;  Service: General;  Laterality: Left;  . carpal tunnel Right 1995  . Right heel spur removed    . SENTINEL NODE BIOPSY Left 10/06/2015   Procedure: SENTINEL NODE BIOPSY;  Surgeon: Hubbard Robinson, MD;  Location: [facility] ORS;  Service: General;  Laterality: Left;  . THUMB ARTHROSCOPY Left 2000   release of a nerve to the thumb.  . TRACHEOSTOMY TUBE PLACEMENT N/A 03/14/2016   Procedure: TRACHEOSTOMY;  Surgeon: Carloyn Manner, MD;  Location: [facility] ORS;  Service: ENT;  Laterality: N/A;  . TUBAL LIGATION  Allergies  Allergen Reactions  . Other Hives and Rash    Shellfish. Shellfish Pt reports no allergic reaction to iodine or betadine.  . Shellfish Allergy Hives and Rash      Medication List    Notice   This visit is during an admission. Changes to the med list made in this visit will be reflected in the After Visit Summary of the admission.     Review of Systems  Constitutional: Positive for chills and fatigue. Negative for activity change, appetite change, diaphoresis and fever.  HENT: Negative for congestion, sneezing, sore  throat, trouble swallowing and voice change.   Respiratory: Negative for apnea, cough, choking, chest tightness, shortness of breath and wheezing.   Cardiovascular: Negative for chest pain, palpitations and leg swelling.  Gastrointestinal: Negative for abdominal distention, abdominal pain, constipation, diarrhea and nausea.  Genitourinary: Negative for difficulty urinating, dysuria, frequency and urgency.  Musculoskeletal: Negative for back pain, gait problem and myalgias. Arthralgias: typical arthritis.  Skin: Positive for wound. Negative for color change, pallor and rash.  Neurological: Negative for dizziness, tremors, syncope, speech difficulty, weakness, numbness and headaches.  Psychiatric/Behavioral: Negative for agitation and behavioral problems.  All other systems reviewed and are negative.   Immunization History  Administered Date(s) Administered  . Influenza,inj,Quad PF,36+ Mos 09/07/2015   Pertinent  Health Maintenance Due  Topic Date Due  . FOOT EXAM  05/06/1958  . OPHTHALMOLOGY EXAM  05/06/1958  . URINE MICROALBUMIN  05/06/1958  . COLONOSCOPY  05/06/1998  . PNA vac Low Risk Adult (1 of 2 - PCV13) 05/06/2013  . INFLUENZA VACCINE  06/13/2016  . HEMOGLOBIN A1C  12/06/2016  . MAMMOGRAM  08/24/2017  . DEXA SCAN  Completed   Fall Risk  02/21/2016  Falls in the past year? No   Functional Status Survey:    Vitals:   08/17/16 0940  BP: (!) 67/43  Pulse: (!) 114  Resp: (!) 24  Temp: 98.6 F (37 C)  SpO2: 99%  Weight: 172 lb 6.4 oz (78.2 kg)   Body mass index is 31.53 kg/m. Physical Exam  Constitutional: She is oriented to person, place, and time. She appears well-developed and well-nourished. She appears lethargic. She is active and cooperative. She does not appear ill. No distress. Nasal cannula in place.  HENT:  Head: Normocephalic and atraumatic.  Mouth/Throat: Uvula is midline, oropharynx is clear and moist and mucous membranes are normal. Mucous membranes are  not pale, not dry and not cyanotic.  Eyes: Conjunctivae, EOM and lids are normal. Pupils are equal, round, and reactive to light.  Neck: Trachea normal, normal range of motion and full passive range of motion without pain. Neck supple. No JVD present. No tracheal deviation, no edema and no erythema present. No thyromegaly present.  Cardiovascular: Regular rhythm, normal heart sounds, intact distal pulses and normal pulses.  Tachycardia present.  Exam reveals no gallop, no distant heart sounds and no friction rub.   No murmur heard. Pulmonary/Chest: Effort normal. No accessory muscle usage. No respiratory distress. She has decreased breath sounds in the right lower field and the left lower field. She has no wheezes. She has rhonchi (faint) in the right upper field and the left upper field. She has no rales. She exhibits no tenderness.  Abdominal: Soft. Normal appearance and bowel sounds are normal. She exhibits no distension and no ascites. There is no tenderness.    Musculoskeletal: Normal range of motion. She exhibits no edema or tenderness.  Expected osteoarthritis, stiffness  Neurological:  She is oriented to person, place, and time. She has normal strength. She appears lethargic.  Skin: Skin is warm, dry and intact. She is not diaphoretic. No cyanosis. No pallor. Nails show no clubbing.  Psychiatric: She has a normal mood and affect. Her speech is normal and behavior is normal. Judgment and thought content normal. Cognition and memory are normal.  Nursing note and vitals reviewed.   Labs reviewed:  Recent Labs  06/10/16 0417  06/12/16 0442 06/12/16 1714  07/07/16 1850  08/09/16 0715 08/17/16 1359 08/17/16 1953 08/18/16 0644  NA 150*  < > 148* 147*  < > 135  < > 143 139  --  142  K 2.8*  < > 2.8* 3.5  < > 3.7  < > 4.0 2.7* 2.9* 2.9*  CL 116*  < > 113* 111  < > 88*  < > 98* 96*  --  104  CO2 26  < > 30 29  < > 34*  < > 36* 33*  --  27  GLUCOSE 263*  < > 216* 186*  < > 263*  < > 142*  408*  --  343*  BUN 63*  < > 43* 34*  < > 39*  < > 43* 55*  --  37*  CREATININE 0.81  < > 0.38* 0.37*  < > 0.42*  < > 0.44 0.71  --  0.53  CALCIUM 8.9  < > 9.2 9.1  < > 9.3  < > 9.6 9.7  --  9.4  MG 1.8  --  1.4* 1.8  --  1.7  --   --   --  1.6*  --   PHOS 2.2*  --  1.4* 2.9  --   --   --   --   --   --   --   < > = values in this interval not displayed.  Recent Labs  07/31/16 1445 08/08/16 0811 08/17/16 1359  AST 38 24 31  ALT 83* 30 35  ALKPHOS 114 102 187*  BILITOT 0.4 0.6 0.6  PROT 6.1* 5.7* 5.7*  ALBUMIN 2.8* 2.6* 2.5*    Recent Labs  07/31/16 1445 08/08/16 0811 08/17/16 1359 08/17/16 1953 08/18/16 0644  WBC 11.0 6.3 7.8 8.0 7.2  NEUTROABS 9.4* 4.6 7.0*  --   --   HGB 11.2* 11.3* 11.1* 11.1* 10.3*  HCT 33.5* 33.2* 34.3* 34.7* 30.3*  MCV 96.3 96.5 99.1 99.7 98.4  PLT 238 191 215 208 190   Lab Results  Component Value Date   TSH 0.124 (L) 07/07/2016   Lab Results  Component Value Date   HGBA1C 10.5 (H) 06/05/2016   Lab Results  Component Value Date   CHOL 173 07/10/2016   HDL 68 07/10/2016   LDLCALC 60 07/10/2016   TRIG 226 (H) 07/10/2016   CHOLHDL 2.5 07/10/2016    Significant Diagnostic Results in last 30 days:  Dg Chest 2 View  Result Date: 08/17/2016 CLINICAL DATA:  Low blood pressure, uncooperative somewhat unresponsive patient, former smoking history EXAM: CHEST  2 VIEW COMPARISON:  Chest x-ray of 06/19/2016 FINDINGS: There is parenchymal opacity within the right mid upper lung most consistent with pneumonia. There is a small cavitary lesion in the left lower lobe with air-fluid level possibly and infected bullus. No pleural effusion is seen. The film is somewhat rotated. The heart is mildly enlarged and stable. IMPRESSION: 1. Patchy opacity in the right mid upper lung most consistent with pneumonia. 2. Cavitary lesion  with thin walls in the left lower lobe with air-fluid level most consistent with an infected bullus. CT of the chest would be helpful if  further assessment is warranted . Electronically Signed   By: Ivar Drape M.D.   On: 08/17/2016 14:36   Ct Chest W Contrast  Addendum Date: 08/18/2016   ADDENDUM REPORT: 08/18/2016 10:36 ADDENDUM: Comparison is made with CT of the abdomen and pelvis performed on 06/08/2016: Since the exam of the abdomen, the cavitary lesion at the left lung base has improved significantly, most consistent with resolving infectious or inflammatory process. Non-contrast chest CT at 3-6 months is recommended. Electronically Signed   By: Nolon Nations M.D.   On: 08/18/2016 10:36   Result Date: 08/18/2016 CLINICAL DATA:  Low blood pressure today. Pneumonia. Coughing for 2 weeks. EXAM: CT CHEST WITH CONTRAST TECHNIQUE: Multidetector CT imaging of the chest was performed during intravenous contrast administration. CONTRAST:  2mL ISOVUE-300 IOPAMIDOL (ISOVUE-300) INJECTION 61% COMPARISON:  03/08/2016 FINDINGS: Cardiovascular: Heart size is upper normal. Coronary artery calcifications are present. Thoracic aorta is atherosclerotic. No aneurysm. Bovine arch anatomy noted. The pulmonary arteries are grossly well opacified. Mediastinum/Nodes: No mediastinal, hilar, or axillary adenopathy. Esophagus is normal. Lungs/Pleura: There has been significant improvement in aeration of the lungs since 03/08/2016. Within the left lower lobe there is a small cavitary lesion measuring 2.2 x 3.4 cm. This is a new finding since prior study. Multiple pulmonary infiltrates have resolved. Faint infiltrates persist in the upper lobes. Scattered calcified granulomata are present. A nodule in the right lower lobe is 5 x 6 mm on image 96 of series 3. More posteriorly in the right lower lobe, the density is 10 x 17 mm. A nodule in the lingula is 5 x 4 mm. Upper Abdomen: Patient has a gastrostomy tube. Gallbladder is present. Musculoskeletal: Visualized osseous structures have a normal appearance. IMPRESSION: 1. Interval development of cavitary lesion in the  left lower lobe. 2. Persistent faint patchy densities in the upper lobes bilaterally. Findings favor infectious process. 3. Coronary artery disease. 4. Atherosclerosis of the thoracic aorta. 5. Gastrostomy tube. Electronically Signed: By: Nolon Nations M.D. On: 08/17/2016 16:08    Assessment/Plan 1. Sepsis, due to unspecified organism [facility])  send to the emergency department for evaluation    Family/ staff Communication:   Total Time:  Documentation:  Face to Face:  Family/Phone:   Labs/tests ordered:    Medication list reviewed and assessed for continued appropriateness.  Vikki Ports, NP-C Geriatrics [facility] [phone], [place] [address] Cell Phone (Mon-Fri 8am-5pm):  [phone] On Call:  [phone] & follow prompts after 5pm & weekends Office Phone:  [phone] Office Fax:  [phone]

## 2016-08-18 NOTE — Progress Notes (Signed)
Pharmacy Antibiotic Note  Ebony [name] is a 70 y.o. female admitted on 08/17/2016 with intra-abdominal infection.  Pharmacy has been consulted for ciprofloxacin dosing.  Plan: Ciprofloxacin 500mg  PO Q12h. Pt is also receiving metronidazole 500mg  PO Q8h. Per Dr. Ola Spurr, stop date for abx 10/9.   Height: 5\' 2"  (157.5 cm) Weight: 184 lb 11.2 oz (83.8 kg) IBW/kg (Calculated) : 50.1  Temp (24hrs), Avg:97.9 F (36.6 C), Min:97.5 F (36.4 C), Max:98.3 F (36.8 C)   Recent Labs Lab 08/17/16 1359 08/17/16 1953 08/18/16 0644  WBC 7.8 8.0 7.2  CREATININE 0.71  --  0.53  LATICACIDVEN 1.9 1.3  --     Estimated Creatinine Clearance: 67.6 mL/min (by C-G formula based on SCr of 0.53 mg/dL).    Allergies  Allergen Reactions  . Other Hives and Rash    Shellfish. Shellfish Pt reports no allergic reaction to iodine or betadine.  . Shellfish Allergy Hives and Rash    Antimicrobials this admission: Vanc 10/5>>  10/6 Zosyn 10/5 >>  10/6 Cipro 10/6 >>  Metronidazole 10/6 >>   Microbiology results: 10/5 BCx: P 10/5 UCx: P  10/5 MRSA PCR: negative  10/5 C. Diff: C. Diff antigen pos, C. Diff toxin negative  Thank you for allowing pharmacy to be a part of this patient's care.  Loree Fee, PharmD 08/18/2016 4:04 PM

## 2016-08-18 NOTE — Progress Notes (Signed)
Initial Nutrition Assessment  DOCUMENTATION CODES:   Severe malnutrition in context of chronic illness  INTERVENTION:  -Spoke with MD Ebony [name] regarding pt nutrition and nutritional concerns at [facility].  Recommend restarting PEG nutrition during acute admission and po diet with supplements to maximize nutrition during acute illness especially with wt loss prior to admission, poor po intake, and wounds.  MD agreeable as long as husband was agreeable to plan. Spoke with husband, Ebony [name] via phone and discussed importance of nutrition during acute illness, wounds and wt loss prior to admission. Husband agreeable to restarting nutrition via PEG, oral diet and supplements. Recommend osmolite 1.5, 4 cans per day at this time.  Provides 1422 kcals, 59 g of protein, and 711ml free water. Recommend adding additional prostat TID for additional 300 kcals and 45 g of protein to better meet protein needs.  Per RD pt was taking a mechanical soft, carb modified diet and will order dysphagia 3 diet at this time. Recommend Ensure Enlive po BID, each supplement provides 350 kcal and 20 grams of protein -If concerns regarding aspiration with po diet would recommend SLP evaluation to better determine safe consistency.  -Spoke with RN, Ebony [name] regarding above plan of care.     NUTRITION DIAGNOSIS:   Malnutrition related to chronic illness as evidenced by percent weight loss, energy intake < or equal to 75% for > or equal to 1 month.    GOAL:   Patient will meet greater than or equal to 90% of their needs    MONITOR:   PO intake, Supplement acceptance, TF tolerance, Weight trends, Labs  REASON FOR ASSESSMENT:    (noted tube feeding order)    ASSESSMENT:      70 y.o. female admitted with pneumonia, sepsis.  Past medical history of pneumonia,PEG tube placement, COPD, DM, breast cancer, HTN, HLD, hypothyroidism.  Spoke with RD, Ebony [name] at [facility] and over the last month pt has not been using PEG tube  for nutrition but has been taking mechanical soft, carb modified diet.  RD reports that pt has not been eating well via po and has lost 7% wt in the last 2 months.  RD has been wanting to restart nutrition via PEG but pt has not wanted to restart feeding.    Spoke with husband, Ebony [name] via phone and husband reports pt has not been eating well. Reports he usually assists pt with eating supper meal but agrees that pt is not really eating much. Prefers finger foods and has been drinking some ensure.    Medications reviewed: determir, aspart, KCL  Labs reviewed: K 2.9, glucose 343, hgb 10.3  Nutrition-Focused physical exam completed. Findings are no fat depletion, no muscle depletion, and unable to assess edema.    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  Estimated Nutritional Needs:   Kcal:  1800-2100 kcals/d  Protein:  100-126 g/d  Fluid:  >/= 1.8 L/d  EDUCATION NEEDS:   Education needs addressed  Ebony [name], [place], [place] (pager) Weekend/On-Call pager [phone])

## 2016-08-18 NOTE — Consult Note (Signed)
[facility] Infectious Disease     Reason for Consult:CRE UTI   Referring Physician: Theodoro Grist Date of Admission:  08/17/2016   Active Problems:   Sepsis ([place])   Healthcare-associated pneumonia   Pneumonia   Pressure injury of skin   HPI: Ebony [name] is a 70 y.o. female admitted with hypotension, weakness and feverish feeling. She had admission in August with pseudomonal PNA and  she has a hx of aspergillus PNA.  On admission wbc was 7 and she was afebrile. She was started on vanco and zosyn. CT showed improvement in  cavitary lesion at Lung base noted previously.   During her previous admission when she had the diverticulitis and cavitary PNA she was dced on 21 days of cipro for the pseudomonas in her sputum and flagyl for the diverticulits. Stop date was to be Aug 16th but apparently ws still on oral abx at [facility] started recently for presumed diverticulitis  She does have a decubitus ulcer as well and has seen wound care RN here.  She has incontinence and is largely bedbound.   Past Medical History:  Diagnosis Date  . Asthma   . Breast cancer ([place])   . Carpal tunnel syndrome   . COPD (chronic obstructive pulmonary disease) ([place])   . Depression   . Diabetes mellitus type 2, uncomplicated ([place])   . Hyperlipidemia   . Hypertension   . Hypothyroidism   . Osteoporosis, post-menopausal   . Psoriasis   . Sleep apnea   . Vitamin D deficiency    Past Surgical History:  Procedure Laterality Date  . BREAST EXCISIONAL BIOPSY Left 10/06/2015   np for surgery lumpectomy  . BREAST LUMPECTOMY WITH NEEDLE LOCALIZATION Left 10/06/2015   Procedure: BREAST LUMPECTOMY WITH NEEDLE LOCALIZATION;  Surgeon: Hubbard Robinson, MD;  Location: [facility] ORS;  Service: General;  Laterality: Left;  . carpal tunnel Right 1995  . Right heel spur removed    . SENTINEL NODE BIOPSY Left 10/06/2015   Procedure: SENTINEL NODE BIOPSY;  Surgeon: Hubbard Robinson, MD;  Location: [facility] ORS;  Service:  General;  Laterality: Left;  . THUMB ARTHROSCOPY Left 2000   release of a nerve to the thumb.  . TRACHEOSTOMY TUBE PLACEMENT N/A 03/14/2016   Procedure: TRACHEOSTOMY;  Surgeon: Carloyn Manner, MD;  Location: [facility] ORS;  Service: ENT;  Laterality: N/A;  . TUBAL LIGATION     Social History  Substance Use Topics  . Smoking status: Former Smoker    Packs/day: 0.50    Years: 30.00    Types: Cigarettes    Quit date: 10/05/2015  . Smokeless tobacco: Never Used  . Alcohol use No   Family History  Problem Relation Age of Onset  . Diabetes Mother   . Thyroid disease Mother   . COPD Father   . Emphysema Father   . Heart disease Brother   . Diabetes Brother   . Heart disease Brother   . Thyroid disease Daughter   . Thyroid disease Son     Allergies:  Allergies  Allergen Reactions  . Other Hives and Rash    Shellfish. Shellfish Pt reports no allergic reaction to iodine or betadine.  . Shellfish Allergy Hives and Rash    Current antibiotics: Antibiotics Given (last 72 hours)    Date/Time Action Medication Dose Rate   08/17/16 2238 Given   piperacillin-tazobactam (ZOSYN) IVPB 4.5 g 4.5 g 25 mL/hr   08/18/16 0227 Given   vancomycin (VANCOCIN) IVPB 1000 mg/200 mL premix  1,000 mg 200 mL/hr   08/18/16 0510 Given   piperacillin-tazobactam (ZOSYN) IVPB 4.5 g 4.5 g 25 mL/hr      MEDICATIONS: . aspirin EC  81 mg Oral BH-q7a  . budesonide (PULMICORT) nebulizer solution  0.5 mg Nebulization BID  . buPROPion  150 mg Oral BH-q7a  . clonazepam  0.25 mg Oral Q12H  . enoxaparin (LOVENOX) injection  40 mg Subcutaneous Q24H  . escitalopram  20 mg Oral Daily  . famotidine  20 mg Per Tube Daily  . free water  200 mL Per Tube Q8H  . gabapentin  100 mg Oral QHS  . guaiFENesin  600 mg Oral BID  . [START ON 08/19/2016] Influenza vac split quadrivalent PF  0.5 mL Intramuscular Tomorrow-1000  . insulin detemir  50 Units Subcutaneous QHS  . ipratropium-albuterol  3 mL Nebulization Q4H  .  letrozole  2.5 mg Oral Daily  . levothyroxine  200 mcg Oral QAC breakfast   And  . levothyroxine  50 mcg Oral QAC breakfast  . piperacillin-tazobactam (ZOSYN)  IV  4.5 g Intravenous Q8H  . potassium chloride  40 mEq Oral BID  . simvastatin  40 mg Oral q1800  . sodium chloride flush  3 mL Intravenous Q12H  . tiotropium  18 mcg Inhalation Daily    Review of Systems - 11 systems reviewed and negative per HPI   OBJECTIVE: Temp:  [97.4 F (36.3 C)-98.3 F (36.8 C)] 98.2 F (36.8 C) (10/06 0814) Pulse Rate:  [93-112] 105 (10/06 0814) Resp:  [15-20] 20 (10/06 0814) BP: (96-151)/(42-91) 96/49 (10/06 0814) SpO2:  [97 %-100 %] 100 % (10/06 0814) Weight:  [80.3 kg (177 lb)-83.8 kg (184 lb 11.2 oz)] 83.8 kg (184 lb 11.2 oz) (10/06 0500) Physical Exam  Constitutional:  oriented to person, place, and time. Morbidly obese, chronically ill appearing, on 2L O2  HENT: [place]/AT, PERRLA, no scleral icterus Trach site healed Mouth/Throat: Oropharynx is clear and dry. No oropharyngeal exudate.  Cardiovascular: Normal rate, regular rhythm and normal heart sounds. Pulmonary/Chest: bil rhonchi Neck = supple, no nuchal rigidity Abdominal: Soft. Bowel sounds are normal.  exhibits mild distension. There is no tenderness.  Lymphadenopathy: no cervical adenopathy. No axillary adenopathy Neurological: alert and oriented to person, place, and time  Skin: some ecchymosis, sacral decub per RN wound nurse note 6 x 5 cm x 3 cm unstagable wound with 100% adherent slough  Psychiatric:flat affect LABS: Results for orders placed or performed during the hospital encounter of 08/17/16 (from the past 48 hour(s))  C difficile quick scan w PCR reflex     Status: Abnormal   Collection Time: 08/17/16  1:44 PM  Result Value Ref Range   C Diff antigen POSITIVE (A) NEGATIVE   C Diff toxin NEGATIVE NEGATIVE   C Diff interpretation Results are indeterminate. See PCR results.   Clostridium Difficile by PCR     Status: None    Collection Time: 08/17/16  1:44 PM  Result Value Ref Range   Toxigenic C Difficile by pcr NEGATIVE NEGATIVE    Comment: Patient is colonized with non toxigenic C. difficile. May not need treatment unless significant symptoms are present.  Lactic acid, plasma     Status: None   Collection Time: 08/17/16  1:59 PM  Result Value Ref Range   Lactic Acid, Venous 1.9 0.5 - 1.9 mmol/L  CBC with Differential     Status: Abnormal   Collection Time: 08/17/16  1:59 PM  Result Value Ref Range  WBC 7.8 3.6 - 11.0 K/uL   RBC 3.46 (L) 3.80 - 5.20 MIL/uL   Hemoglobin 11.1 (L) 12.0 - 16.0 g/dL   HCT 34.3 (L) 35.0 - 47.0 %   MCV 99.1 80.0 - 100.0 fL   MCH 32.1 26.0 - 34.0 pg   MCHC 32.4 32.0 - 36.0 g/dL   RDW 17.9 (H) 11.5 - 14.5 %   Platelets 215 150 - 440 K/uL   Neutrophils Relative % 89 %   Lymphocytes Relative 8 %   Monocytes Relative 3 %   Eosinophils Relative 0 %   Basophils Relative 0 %   Neutro Abs 7.0 (H) 1.4 - 6.5 K/uL   Lymphs Abs 0.6 (L) 1.0 - 3.6 K/uL   Monocytes Absolute 0.2 0.2 - 0.9 K/uL   Eosinophils Absolute 0.0 0 - 0.7 K/uL   Basophils Absolute 0.0 0 - 0.1 K/uL   Smear Review MORPHOLOGY UNREMARKABLE   Comprehensive metabolic panel     Status: Abnormal   Collection Time: 08/17/16  1:59 PM  Result Value Ref Range   Sodium 139 135 - 145 mmol/L   Potassium 2.7 (LL) 3.5 - 5.1 mmol/L    Comment: CRITICAL RESULT CALLED TO, READ BACK BY AND VERIFIED WITH STEVEN JONES 08/17/16 1451 KLW    Chloride 96 (L) 101 - 111 mmol/L   CO2 33 (H) 22 - 32 mmol/L   Glucose, Bld 408 (H) 65 - 99 mg/dL   BUN 55 (H) 6 - 20 mg/dL   Creatinine, Ser 0.71 0.44 - 1.00 mg/dL   Calcium 9.7 8.9 - 10.3 mg/dL   Total Protein 5.7 (L) 6.5 - 8.1 g/dL   Albumin 2.5 (L) 3.5 - 5.0 g/dL   AST 31 15 - 41 U/L   ALT 35 14 - 54 U/L   Alkaline Phosphatase 187 (H) 38 - 126 U/L   Total Bilirubin 0.6 0.3 - 1.2 mg/dL   GFR calc non Af Amer >60 >60 mL/min   GFR calc Af Amer >60 >60 mL/min    Comment: (NOTE) The eGFR  has been calculated using the CKD EPI equation. This calculation has not been validated in all clinical situations. eGFR's persistently <60 mL/min signify possible Chronic Kidney Disease.    Anion gap 10 5 - 15  Brain natriuretic peptide     Status: Abnormal   Collection Time: 08/17/16  1:59 PM  Result Value Ref Range   B Natriuretic Peptide 162.0 (H) 0.0 - 100.0 pg/mL  Troponin I     Status: Abnormal   Collection Time: 08/17/16  1:59 PM  Result Value Ref Range   Troponin I 0.06 (HH) <0.03 ng/mL    Comment: CRITICAL RESULT CALLED TO, READ BACK BY AND VERIFIED WITH STEVEN JONES 08/17/16 1451 KLW   Culture, blood (routine x 2)     Status: None (Preliminary result)   Collection Time: 08/17/16  2:01 PM  Result Value Ref Range   Specimen Description BLOOD RIGHT AC    Special Requests BOTTLES DRAWN AEROBIC AND ANAEROBIC AER8CC ANA5CC    Culture NO GROWTH < 24 HOURS    Report Status PENDING   Culture, blood (routine x 2)     Status: None (Preliminary result)   Collection Time: 08/17/16  2:01 PM  Result Value Ref Range   Specimen Description BLOOD RIGHT AC    Special Requests      BOTTLES DRAWN AEROBIC AND ANAEROBIC AER 8CC ANA 5CC   Culture NO GROWTH < 24 HOURS  Report Status PENDING   Blood gas, arterial     Status: Abnormal   Collection Time: 08/17/16  5:26 PM  Result Value Ref Range   FIO2 28.00    Delivery systems NASAL CANNULA    pH, Arterial 7.46 (H) 7.350 - 7.450   pCO2 arterial 41 32.0 - 48.0 mmHg   pO2, Arterial 83 83.0 - 108.0 mmHg   Bicarbonate 29.2 (H) 20.0 - 28.0 mmol/L   Acid-Base Excess 4.9 (H) 0.0 - 2.0 mmol/L   O2 Saturation 96.7 %   Patient temperature 37.0    Collection site LEFT RADIAL    Sample type ARTERIAL DRAW    Allens test (pass/fail) PASS PASS  Urinalysis complete, with microscopic     Status: Abnormal   Collection Time: 08/17/16  5:39 PM  Result Value Ref Range   Color, Urine YELLOW (A) YELLOW   APPearance CLEAR (A) CLEAR   Glucose, UA >500  (A) NEGATIVE mg/dL   Bilirubin Urine NEGATIVE NEGATIVE   Ketones, ur TRACE (A) NEGATIVE mg/dL   Specific Gravity, Urine 1.032 (H) 1.005 - 1.030   Hgb urine dipstick NEGATIVE NEGATIVE   pH 5.0 5.0 - 8.0   Protein, ur NEGATIVE NEGATIVE mg/dL   Nitrite POSITIVE (A) NEGATIVE   Leukocytes, UA TRACE (A) NEGATIVE   RBC / HPF 0-5 0 - 5 RBC/hpf   WBC, UA 0-5 0 - 5 WBC/hpf   Bacteria, UA FEW (A) NONE SEEN   Squamous Epithelial / LPF NONE SEEN NONE SEEN   Mucous PRESENT   Glucose, capillary     Status: Abnormal   Collection Time: 08/17/16  6:27 PM  Result Value Ref Range   Glucose-Capillary 478 (H) 65 - 99 mg/dL  Glucose, capillary     Status: Abnormal   Collection Time: 08/17/16  6:36 PM  Result Value Ref Range   Glucose-Capillary 466 (H) 65 - 99 mg/dL  Lactic acid, plasma     Status: None   Collection Time: 08/17/16  7:53 PM  Result Value Ref Range   Lactic Acid, Venous 1.3 0.5 - 1.9 mmol/L  Magnesium     Status: Abnormal   Collection Time: 08/17/16  7:53 PM  Result Value Ref Range   Magnesium 1.6 (L) 1.7 - 2.4 mg/dL  Potassium     Status: Abnormal   Collection Time: 08/17/16  7:53 PM  Result Value Ref Range   Potassium 2.9 (L) 3.5 - 5.1 mmol/L  CBC     Status: Abnormal   Collection Time: 08/17/16  7:53 PM  Result Value Ref Range   WBC 8.0 3.6 - 11.0 K/uL   RBC 3.48 (L) 3.80 - 5.20 MIL/uL   Hemoglobin 11.1 (L) 12.0 - 16.0 g/dL   HCT 34.7 (L) 35.0 - 47.0 %   MCV 99.7 80.0 - 100.0 fL   MCH 31.9 26.0 - 34.0 pg   MCHC 32.0 32.0 - 36.0 g/dL   RDW 18.1 (H) 11.5 - 14.5 %   Platelets 208 150 - 440 K/uL  MRSA PCR Screening     Status: None   Collection Time: 08/17/16  8:06 PM  Result Value Ref Range   MRSA by PCR NEGATIVE NEGATIVE    Comment:        The GeneXpert MRSA Assay (FDA approved for NASAL specimens only), is one component of a comprehensive MRSA colonization surveillance program. It is not intended to diagnose MRSA infection nor to guide or monitor treatment for MRSA  infections.   Glucose, capillary  Status: Abnormal   Collection Time: 08/17/16 10:07 PM  Result Value Ref Range   Glucose-Capillary 423 (H) 65 - 99 mg/dL  Troponin I     Status: Abnormal   Collection Time: 08/18/16 12:11 AM  Result Value Ref Range   Troponin I 0.04 (HH) <0.03 ng/mL    Comment: CRITICAL VALUE NOTED. VALUE IS CONSISTENT WITH PREVIOUSLY REPORTED/CALLED VALUE TLB   Glucose, capillary     Status: Abnormal   Collection Time: 08/18/16 12:26 AM  Result Value Ref Range   Glucose-Capillary 386 (H) 65 - 99 mg/dL  Glucose, capillary     Status: Abnormal   Collection Time: 08/18/16  3:58 AM  Result Value Ref Range   Glucose-Capillary 347 (H) 65 - 99 mg/dL  Troponin I     Status: Abnormal   Collection Time: 08/18/16  6:44 AM  Result Value Ref Range   Troponin I 0.05 (HH) <0.03 ng/mL    Comment: CRITICAL VALUE NOTED. VALUE IS CONSISTENT WITH PREVIOUSLY REPORTED/CALLED VALUE  SDR   Basic metabolic panel     Status: Abnormal   Collection Time: 08/18/16  6:44 AM  Result Value Ref Range   Sodium 142 135 - 145 mmol/L   Potassium 2.9 (L) 3.5 - 5.1 mmol/L   Chloride 104 101 - 111 mmol/L   CO2 27 22 - 32 mmol/L   Glucose, Bld 343 (H) 65 - 99 mg/dL   BUN 37 (H) 6 - 20 mg/dL   Creatinine, Ser 0.53 0.44 - 1.00 mg/dL   Calcium 9.4 8.9 - 10.3 mg/dL   GFR calc non Af Amer >60 >60 mL/min   GFR calc Af Amer >60 >60 mL/min    Comment: (NOTE) The eGFR has been calculated using the CKD EPI equation. This calculation has not been validated in all clinical situations. eGFR's persistently <60 mL/min signify possible Chronic Kidney Disease.    Anion gap 11 5 - 15  CBC     Status: Abnormal   Collection Time: 08/18/16  6:44 AM  Result Value Ref Range   WBC 7.2 3.6 - 11.0 K/uL   RBC 3.08 (L) 3.80 - 5.20 MIL/uL   Hemoglobin 10.3 (L) 12.0 - 16.0 g/dL   HCT 30.3 (L) 35.0 - 47.0 %   MCV 98.4 80.0 - 100.0 fL   MCH 33.5 26.0 - 34.0 pg   MCHC 34.0 32.0 - 36.0 g/dL   RDW 17.5 (H) 11.5 -  14.5 %   Platelets 190 150 - 440 K/uL   No components found for: ESR, C REACTIVE PROTEIN MICRO: Recent Results (from the past 720 hour(s))  Urine culture     Status: None   Collection Time: 07/26/16 11:45 AM  Result Value Ref Range Status   Specimen Description URINE, RANDOM  Final   Special Requests NONE  Final   Culture NO GROWTH Performed at [facility]   Final   Report Status 07/27/2016 FINAL  Final  C difficile quick scan w PCR reflex     Status: Abnormal   Collection Time: 08/17/16  1:44 PM  Result Value Ref Range Status   C Diff antigen POSITIVE (A) NEGATIVE Final   C Diff toxin NEGATIVE NEGATIVE Final   C Diff interpretation Results are indeterminate. See PCR results.  Final  Clostridium Difficile by PCR     Status: None   Collection Time: 08/17/16  1:44 PM  Result Value Ref Range Status   Toxigenic C Difficile by pcr NEGATIVE NEGATIVE Final  Comment: Patient is colonized with non toxigenic C. difficile. May not need treatment unless significant symptoms are present.  Culture, blood (routine x 2)     Status: None (Preliminary result)   Collection Time: 08/17/16  2:01 PM  Result Value Ref Range Status   Specimen Description BLOOD RIGHT AC  Final   Special Requests BOTTLES DRAWN AEROBIC AND ANAEROBIC AER8CC ANA5CC  Final   Culture NO GROWTH < 24 HOURS  Final   Report Status PENDING  Incomplete  Culture, blood (routine x 2)     Status: None (Preliminary result)   Collection Time: 08/17/16  2:01 PM  Result Value Ref Range Status   Specimen Description BLOOD RIGHT AC  Final   Special Requests   Final    BOTTLES DRAWN AEROBIC AND ANAEROBIC AER 8CC ANA 5CC   Culture NO GROWTH < 24 HOURS  Final   Report Status PENDING  Incomplete  MRSA PCR Screening     Status: None   Collection Time: 08/17/16  8:06 PM  Result Value Ref Range Status   MRSA by PCR NEGATIVE NEGATIVE Final    Comment:        The GeneXpert MRSA Assay (FDA approved for NASAL specimens only), is  one component of a comprehensive MRSA colonization surveillance program. It is not intended to diagnose MRSA infection nor to guide or monitor treatment for MRSA infections.     IMAGING: Dg Chest 2 View  Result Date: 08/17/2016 CLINICAL DATA:  Low blood pressure, uncooperative somewhat unresponsive patient, former smoking history EXAM: CHEST  2 VIEW COMPARISON:  Chest x-ray of 06/19/2016 FINDINGS: There is parenchymal opacity within the right mid upper lung most consistent with pneumonia. There is a small cavitary lesion in the left lower lobe with air-fluid level possibly and infected bullus. No pleural effusion is seen. The film is somewhat rotated. The heart is mildly enlarged and stable. IMPRESSION: 1. Patchy opacity in the right mid upper lung most consistent with pneumonia. 2. Cavitary lesion with thin walls in the left lower lobe with air-fluid level most consistent with an infected bullus. CT of the chest would be helpful if further assessment is warranted . Electronically Signed   By: Ivar Drape M.D.   On: 08/17/2016 14:36   Ct Chest W Contrast  Addendum Date: 08/18/2016   ADDENDUM REPORT: 08/18/2016 10:36 ADDENDUM: Comparison is made with CT of the abdomen and pelvis performed on 06/08/2016: Since the exam of the abdomen, the cavitary lesion at the left lung base has improved significantly, most consistent with resolving infectious or inflammatory process. Non-contrast chest CT at 3-6 months is recommended. Electronically Signed   By: Nolon Nations M.D.   On: 08/18/2016 10:36   Result Date: 08/18/2016 CLINICAL DATA:  Low blood pressure today. Pneumonia. Coughing for 2 weeks. EXAM: CT CHEST WITH CONTRAST TECHNIQUE: Multidetector CT imaging of the chest was performed during intravenous contrast administration. CONTRAST:  31m ISOVUE-300 IOPAMIDOL (ISOVUE-300) INJECTION 61% COMPARISON:  03/08/2016 FINDINGS: Cardiovascular: Heart size is upper normal. Coronary artery calcifications are  present. Thoracic aorta is atherosclerotic. No aneurysm. Bovine arch anatomy noted. The pulmonary arteries are grossly well opacified. Mediastinum/Nodes: No mediastinal, hilar, or axillary adenopathy. Esophagus is normal. Lungs/Pleura: There has been significant improvement in aeration of the lungs since 03/08/2016. Within the left lower lobe there is a small cavitary lesion measuring 2.2 x 3.4 cm. This is a new finding since prior study. Multiple pulmonary infiltrates have resolved. Faint infiltrates persist in the upper lobes. Scattered  calcified granulomata are present. A nodule in the right lower lobe is 5 x 6 mm on image 96 of series 3. More posteriorly in the right lower lobe, the density is 10 x 17 mm. A nodule in the lingula is 5 x 4 mm. Upper Abdomen: Patient has a gastrostomy tube. Gallbladder is present. Musculoskeletal: Visualized osseous structures have a normal appearance. IMPRESSION: 1. Interval development of cavitary lesion in the left lower lobe. 2. Persistent faint patchy densities in the upper lobes bilaterally. Findings favor infectious process. 3. Coronary artery disease. 4. Atherosclerosis of the thoracic aorta. 5. Gastrostomy tube. Electronically Signed: By: Nolon Nations M.D. On: 08/17/2016 16:08    Assessment:   SHAWNDA [name] is a 70 y.o. 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 Her wbc is nml, no fevers, ct chest shows improvement in prior cavitary lesion, she is not producing sputum, having no abd pain (had hx of divertiultiis and recently started as otpt on oral cipro and flagyl for abd pain and presumed diverticulitis).  UA negative.  I think her pulm process is improving and does not  need abx for PNA.    Recommendations Sacral wound- unstageable - bedbound, has incontinence, has low albumin. Poor prognosis Would follow  wound care RN recs for management. Would refer to wound care center at [place] Not sure if abx will help at this point so would treat locally with wound care and if worsens can culture and start abx under guidance of wound care center as otpt  H/o Diverticulitis - recently started on cipro flagyl so would finish a 10 day course- stop date 10/9 since started 9/29 as otpt   Thank you very much for allowing me to participate in the care of this patient. Please call with questions.   Cheral Marker. Ola Spurr, MD

## 2016-08-18 NOTE — Progress Notes (Signed)
Pt waqs given digoxin iv as ordered and hr  Down to 112 -114 now. Report given to [employer]

## 2016-08-18 NOTE — Progress Notes (Signed)
[facility] [place] NAME: Ebony [name]    MR#:  [id number]  DATE OF BIRTH:  01/06/1946  SUBJECTIVE:  Came in from nursing home with increasing weakness and some low-grade fever. Has chronic cough. Unable to produce any phlegm. Patient is bedbound. According to the staff at nursing home patient is sleepy most of the day.   REVIEW OF SYSTEMS:   Review of Systems  Constitutional: Negative for chills, fever and weight loss.  HENT: Negative for ear discharge, ear pain and nosebleeds.   Eyes: Negative for blurred vision, pain and discharge.  Respiratory: Positive for cough and shortness of breath. Negative for sputum production, wheezing and stridor.   Cardiovascular: Negative for chest pain, palpitations, orthopnea and PND.  Gastrointestinal: Negative for abdominal pain, diarrhea, nausea and vomiting.  Genitourinary: Negative for frequency and urgency.  Musculoskeletal: Negative for back pain and joint pain.  Neurological: Positive for weakness. Negative for sensory change, speech change and focal weakness.  Psychiatric/Behavioral: Negative for depression and hallucinations. The patient is not nervous/anxious.    Tolerating Diet:yes Tolerating PT: bed bound  DRUG ALLERGIES:   Allergies  Allergen Reactions  . Other Hives and Rash    Shellfish. Shellfish Pt reports no allergic reaction to iodine or betadine.  . Shellfish Allergy Hives and Rash    VITALS:  Blood pressure 136/62, pulse (!) 121, temperature 98 F (36.7 C), temperature source Oral, resp. rate 20, height 5\' 2"  (1.575 m), weight 83.8 kg (184 lb 11.2 oz), SpO2 99 %.  PHYSICAL EXAMINATION:   Physical Exam  GENERAL:  70 y.o.-year-old patient lying in the bed with no acute distress. obese EYES: Pupils equal, round, reactive to light and accommodation. No scleral icterus. Extraocular muscles intact.  HEENT: Head atraumatic, normocephalic. Oropharynx and nasopharynx  clear.  NECK:  Supple, no jugular venous distention. No thyroid enlargement, no tenderness.  LUNGS:decreased breath sounds bilaterally, no wheezing, rales, rhonchi. No use of accessory muscles of respiration.  CARDIOVASCULAR: S1, S2 normal. No murmurs, rubs, or gallops. Mild tachy ABDOMEN: Soft, nontender, nondistended. Bowel sounds present. No organomegaly or mass.  EXTREMITIES: No cyanosis, clubbing  ++chronic edema b/l.   Boots+ NEUROLOGIC: Cranial nerves II through XII are intact. No focal Motor or sensory deficits b/l.   PSYCHIATRIC:  patient is alert  SKIN: No obvious rash, lesion, chronic decubitus ulcer.   LABORATORY PANEL:  CBC  Recent Labs Lab 08/18/16 0644  WBC 7.2  HGB 10.3*  HCT 30.3*  PLT 190    Chemistries   Recent Labs Lab 08/17/16 1359 08/17/16 1953 08/18/16 0644  NA 139  --  142  K 2.7* 2.9* 2.9*  CL 96*  --  104  CO2 33*  --  27  GLUCOSE 408*  --  343*  BUN 55*  --  37*  CREATININE 0.71  --  0.53  CALCIUM 9.7  --  9.4  MG  --  1.6*  --   AST 31  --   --   ALT 35  --   --   ALKPHOS 187*  --   --   BILITOT 0.6  --   --    Cardiac Enzymes  Recent Labs Lab 08/18/16 0644  TROPONINI 0.05*   RADIOLOGY:  Dg Chest 2 View  Result Date: 08/17/2016 CLINICAL DATA:  Low blood pressure, uncooperative somewhat unresponsive patient, former smoking history EXAM: CHEST  2 VIEW COMPARISON:  Chest x-ray of 06/19/2016 FINDINGS: There is parenchymal  opacity within the right mid upper lung most consistent with pneumonia. There is a small cavitary lesion in the left lower lobe with air-fluid level possibly and infected bullus. No pleural effusion is seen. The film is somewhat rotated. The heart is mildly enlarged and stable. IMPRESSION: 1. Patchy opacity in the right mid upper lung most consistent with pneumonia. 2. Cavitary lesion with thin walls in the left lower lobe with air-fluid level most consistent with an infected bullus. CT of the chest would be helpful if  further assessment is warranted . Electronically Signed   By: Ivar Drape M.D.   On: 08/17/2016 14:36   Ct Chest W Contrast  Addendum Date: 08/18/2016   ADDENDUM REPORT: 08/18/2016 10:36 ADDENDUM: Comparison is made with CT of the abdomen and pelvis performed on 06/08/2016: Since the exam of the abdomen, the cavitary lesion at the left lung base has improved significantly, most consistent with resolving infectious or inflammatory process. Non-contrast chest CT at 3-6 months is recommended. Electronically Signed   By: Nolon Nations M.D.   On: 08/18/2016 10:36   Result Date: 08/18/2016 CLINICAL DATA:  Low blood pressure today. Pneumonia. Coughing for 2 weeks. EXAM: CT CHEST WITH CONTRAST TECHNIQUE: Multidetector CT imaging of the chest was performed during intravenous contrast administration. CONTRAST:  2mL ISOVUE-300 IOPAMIDOL (ISOVUE-300) INJECTION 61% COMPARISON:  03/08/2016 FINDINGS: Cardiovascular: Heart size is upper normal. Coronary artery calcifications are present. Thoracic aorta is atherosclerotic. No aneurysm. Bovine arch anatomy noted. The pulmonary arteries are grossly well opacified. Mediastinum/Nodes: No mediastinal, hilar, or axillary adenopathy. Esophagus is normal. Lungs/Pleura: There has been significant improvement in aeration of the lungs since 03/08/2016. Within the left lower lobe there is a small cavitary lesion measuring 2.2 x 3.4 cm. This is a new finding since prior study. Multiple pulmonary infiltrates have resolved. Faint infiltrates persist in the upper lobes. Scattered calcified granulomata are present. A nodule in the right lower lobe is 5 x 6 mm on image 96 of series 3. More posteriorly in the right lower lobe, the density is 10 x 17 mm. A nodule in the lingula is 5 x 4 mm. Upper Abdomen: Patient has a gastrostomy tube. Gallbladder is present. Musculoskeletal: Visualized osseous structures have a normal appearance. IMPRESSION: 1. Interval development of cavitary lesion in the  left lower lobe. 2. Persistent faint patchy densities in the upper lobes bilaterally. Findings favor infectious process. 3. Coronary artery disease. 4. Atherosclerosis of the thoracic aorta. 5. Gastrostomy tube. Electronically Signed: By: Nolon Nations M.D. On: 08/17/2016 16:08   ASSESSMENT AND PLAN:  Ebony [name]  is a 70 y.o. female with a known history of Multiple medical problems including admission for cavitating pneumonia in August 2017 due to Pseudomonas infection, malnutrition, status post PEG tube placement, COPD, diabetes, breast cancer, hypertension, hyperlipidemia, hypothyroidism, diarrhea, who presents to the hospital with complaints of cough, yellow sputum. Sputum production for the past 2 weeks, worsening shortness of breath. Patient admits of feeling feverish and chilly, very weak over the past few weeks,   #1. Known history of cavitary lesion in the left lung. Patient just finished course of antibiotic in August 2017  -DC vancomycin since MRSA PCR 08/18/2016 is negative  -Patient clinically does not look septic. Her white count is normal. CT chest reviewed again with the radiologist and hardly cavitary lung lesions seem to be improved.  -Empirically on IV Zosyn -Spoke with Dr. Ola Spurr who knows patient well to see if patient needs further antibiotic   #2. Hypokalemia,  get magnesium level, supplement potassium intravenously, follow potassium level closely  #3 elevated troponin, likely demand ischemia, follow cardiac enzymes 3, no beta blockers, nitroglycerin due to hypotension, continue aspirin therapy -Patient denies any chest pain. We'll hold off on any further workup at present  #4 protein calorie malnutrition.  -Patient has been taking diet orally. However dietitian feels she is still severely protein calorie malnutrition hence will start some PEG feeding bolus after discussing with husband  #5 severely morbid obesity bedbound with decubitus ulcer  -patient just  finished her course with Cipro and Flagyl yesterday which was given at [facility]  Left message for husband to call me back.  Case discussed with Care Management/Social Worker. Management plans discussed with the patient, family and they are in agreement.  CODE STATUS: Full   DVT Prophylaxis: Lovenox   TOTAL TIME TAKING CARE OF THIS PATIENT: 30es.  >50% time spent on counselling and coordination of care  POSSIBLE D/C IN1YS, DEPENDING ON CLINICAL CONDITION.  Note: This dictation was prepared with Dragon dictation along with smaller phrase technology. Any transcriptional errors that result from this process are unintentional.  Devan Babino M.D on 08/18/2016 at 2:42 PM  Between 7am to 6pm - Pager - [phone]  After 6pm go to [email or web address] - password [email or web address] [facility]  Office  [phone]  CC: Primary care physician; Renee Rival, NP

## 2016-08-18 NOTE — Progress Notes (Signed)
Pharmacy Antibiotic Note  Ebony [name] is a 70 y.o. female admitted on 08/17/2016 with pneumonia/?HCAP.  Pharmacy has been consulted for Zosyn dosing. (Vancomycin discontinued 10/6, MRSA PCR negative) Patient from SNF - hospice following. Chest CT= cavitary lesion. Patient with Bronchial washings 06/10/16 that were + Pseudomonas aeurginosa.  Plan: Will continue Zosyn EI 4.5 gm IV q8h for hx of Pseudomonas- watch to de-escalate dose.    Height: 5\' 2"  (157.5 cm) Weight: 184 lb 11.2 oz (83.8 kg) IBW/kg (Calculated) : 50.1  Adj BW= 62.6 kg  Temp (24hrs), Avg:97.9 F (36.6 C), Min:97.5 F (36.4 C), Max:98.3 F (36.8 C)   Recent Labs Lab 08/17/16 1359 08/17/16 1953 08/18/16 0644  WBC 7.8 8.0 7.2  CREATININE 0.71  --  0.53  LATICACIDVEN 1.9 1.3  --     Estimated Creatinine Clearance: 67.6 mL/min (by C-G formula based on SCr of 0.53 mg/dL).    Allergies  Allergen Reactions  . Other Hives and Rash    Shellfish. Shellfish Pt reports no allergic reaction to iodine or betadine.  . Shellfish Allergy Hives and Rash    Antimicrobials this admission: Vanc 10/5>> 10/6  Zosyn 10/5 >>    Dose adjustments this admission:    Microbiology results: 10/5 BCx: P 10/5 UCx: P    Sputum:    10/5 MRSA PCR: neg  10/5 Cdiff neg  Thank you for allowing pharmacy to be a part of this patient's care.  Greely Atiyeh A 08/18/2016 2:29 PM

## 2016-08-18 NOTE — Consult Note (Signed)
PATIENT NAME: Ebony [name]    MR#:  [id number]  DATE OF BIRTH:  [birth year]  DATE OF ADMISSION:  08/17/2016  PRIMARY CARE PHYSICIAN: Renee Rival, NP   REQUESTING/REFERRING PHYSICIAN: Dr. Ether Griffins  CHIEF COMPLAINT:  SOB      HISTORY OF PRESENT ILLNESS: Ebony [name]  is a 70 y.o. female with a known history of Multiple medical problems including admission for cavitating pneumonia in August 2017 due to Pseudomonas infection, malnutrition, status post PEG tube placement, COPD, diabetes, breast cancer, hypertension, hyperlipidemia, hypothyroidism, diarrhea,  -who presents to the hospital with complaints of cough, yellow sputum.  -Sputum production for the past 2 weeks, worsening shortness of breath.  -Patient admits of feeling feverish and chilly, very weak over the past few weeks, she's been coughing and wheezing and is very short of breath.  -Prior to arrival to the hospital, patient was noted to be hypotensive with systolic blood pressure of 68 of a palpable, she was administered 1 L of IV fluids, her blood pressure has improved.  -On arrival to emergency room, the  patient had CT scan of her chest, revealing interval development of cavitary lesion and left lower lobe which could be related top previous LLL opacity -persistent faint patchy densities in upper lobes bilaterally, coronary artery disease, atherosclerosis, gastrostomy tube.     PAST MEDICAL HISTORY:       Past Medical History:  Diagnosis Date  . Asthma   . Breast cancer ([place])   . Carpal tunnel syndrome   . COPD (chronic obstructive pulmonary disease) ([place])   . Depression   . Diabetes mellitus type 2, uncomplicated ([place])   . Hyperlipidemia   . Hypertension   . Hypothyroidism   . Osteoporosis, post-menopausal   . Psoriasis   . Sleep apnea   . Vitamin D deficiency     PAST SURGICAL HISTORY: Past Surgical History:  Procedure Laterality Date  . BREAST EXCISIONAL BIOPSY Left 10/06/2015    np for surgery lumpectomy  . BREAST LUMPECTOMY WITH NEEDLE LOCALIZATION Left 10/06/2015   Procedure: BREAST LUMPECTOMY WITH NEEDLE LOCALIZATION;  Surgeon: Hubbard Robinson, MD;  Location: [facility] ORS;  Service: General;  Laterality: Left;  . carpal tunnel Right 1995  . Right heel spur removed    . SENTINEL NODE BIOPSY Left 10/06/2015   Procedure: SENTINEL NODE BIOPSY;  Surgeon: Hubbard Robinson, MD;  Location: [facility] ORS;  Service: General;  Laterality: Left;  . THUMB ARTHROSCOPY Left 2000   release of a nerve to the thumb.  . TRACHEOSTOMY TUBE PLACEMENT N/A 03/14/2016   Procedure: TRACHEOSTOMY;  Surgeon: Carloyn Manner, MD;  Location: [facility] ORS;  Service: ENT;  Laterality: N/A;  . TUBAL LIGATION      SOCIAL HISTORY:       Social History  Substance Use Topics  . Smoking status: Former Smoker    Packs/day: 0.50    Years: 30.00    Types: Cigarettes    Quit date: 10/05/2015  . Smokeless tobacco: Never Used  . Alcohol use No    FAMILY HISTORY:       Family History  Problem Relation Age of Onset  . Diabetes Mother   . Thyroid disease Mother   . COPD Father   . Emphysema Father   . Heart disease Brother   . Diabetes Brother   . Heart disease Brother   . Thyroid disease Daughter   . Thyroid disease Son     DRUG ALLERGIES:       Allergies  Allergen Reactions  . Other Hives and Rash    Shellfish. Shellfish Pt reports no allergic reaction to iodine or betadine.  . Shellfish Allergy Hives and Rash    Review of Systems  Constitutional: Positive for chills, fever and malaise/fatigue. Negative for weight loss.  HENT: Negative for congestion.   Eyes: Negative for blurred vision and double vision.  Respiratory: Positive for cough, sputum production, shortness of breath and wheezing. Negative for hemoptysis.   Cardiovascular: Negative for chest pain, palpitations, orthopnea, leg swelling and PND.  Gastrointestinal: Negative for abdominal  pain, blood in stool, constipation, diarrhea, nausea and vomiting.  Genitourinary: Negative for dysuria, frequency, hematuria and urgency.  Musculoskeletal: Negative for falls.  Neurological: Negative for dizziness, tremors, focal weakness and headaches.  Endo/Heme/Allergies: Does not bruise/bleed easily.  Psychiatric/Behavioral: Negative for depression. The patient does not have insomnia.     MEDICATIONS AT HOME:         Prior to Admission medications   Medication Sig Start Date End Date Taking? Authorizing Provider  Adalimumab (HUMIRA PEN-PSORIASIS STARTER) 40 MG/0.8ML PNKT Inject 40 mg into the skin every 14 (fourteen) days.     Historical Provider, MD  albuterol (PROAIR HFA) 108 (90 BASE) MCG/ACT inhaler INHALE 2 PUFFS BY MOUTH EVERY 4 HOURS AS NEEDED FOR SHORTNESS OF BREATH AND/OR WHEEZING. 09/28/14   Historical Provider, MD  albuterol (PROVENTIL) (2.5 MG/3ML) 0.083% nebulizer solution Inhale 1 vial using nebulizer every six hours as needed for shortness of breath and/or wheezing.    Historical Provider, MD  aspirin EC 81 MG tablet Take 81 mg by mouth every morning.     Historical Provider, MD  BREO ELLIPTA 100-25 MCG/INH AEPB Inhale 2 puffs into the lungs daily. 05/25/16   Historical Provider, MD  budesonide-formoterol (SYMBICORT) 160-4.5 MCG/ACT inhaler INHALE 2 PUFFS BY MOUTH TWICE DAILY 09/15/14   Historical Provider, MD  buPROPion (WELLBUTRIN XL) 150 MG 24 hr tablet Take 150 mg by mouth every morning.     Historical Provider, MD  clobetasol (TEMOVATE) 0.05 % external solution Apply 1 application topically daily as needed (for psoriasis.).  02/15/16   Historical Provider, MD  clonazePAM (KLONOPIN) 0.25 MG disintegrating tablet Take 1 tablet by mouth every 12 (twelve) hours.  05/25/16   Historical Provider, MD  dexamethasone (DECADRON) 4 MG tablet Take 4 mg by mouth daily. 05/25/16   Historical Provider, MD  escitalopram (LEXAPRO) 20 MG tablet Take 20 mg by mouth  daily. 05/25/16   Historical Provider, MD  famotidine (PEPCID) 40 MG/5ML suspension Place 2.5 mLs (20 mg total) into feeding tube daily. 06/26/16   Henreitta Leber, MD  furosemide (LASIX) 20 MG tablet Take 20 mg by mouth 2 (two) times daily.    Historical Provider, MD  gabapentin (NEURONTIN) 100 MG capsule Take 100 mg by mouth at bedtime. 05/25/16   Historical Provider, MD  glipiZIDE (GLUCOTROL) 5 MG tablet Take 1 tablet by mouth daily before lunch.  08/14/15   Historical Provider, MD  HUMALOG 100 UNIT/ML injection  05/25/16   Historical Provider, MD  insulin aspart (NOVOLOG) 100 UNIT/ML injection Inject 4 Units into the skin every 4 (four) hours. 06/26/16   Henreitta Leber, MD  insulin detemir (LEVEMIR) 100 UNIT/ML injection Inject 40 Units into the skin at bedtime.    Historical Provider, MD  INVOKANA 100 MG TABS tablet Take 100 mg by mouth daily.  05/25/16   Historical Provider, MD  letrozole (FEMARA) 2.5 MG tablet Take 2.5 mg by mouth daily.  Historical Provider, MD  levothyroxine (SYNTHROID, LEVOTHROID) 200 MCG tablet Take 200 mcg by mouth daily before breakfast. *Take along with 50 mcg to equal 250 mcg total dose*    Historical Provider, MD  levothyroxine (SYNTHROID, LEVOTHROID) 50 MCG tablet Take 50 mcg by mouth daily before breakfast. *Take along with 200 mcg tablet to equal 250 mcg total dose.*    Historical Provider, MD  losartan-hydrochlorothiazide (HYZAAR) 100-25 MG tablet Take 1 tablet by mouth daily. In am.    Historical Provider, MD  metoprolol (LOPRESSOR) 50 MG tablet Take 50 mg by mouth 2 (two) times daily. 05/25/16   Historical Provider, MD  Morphine Sulfate (MORPHINE CONCENTRATE) 10 MG/0.5ML SOLN concentrated solution Take 0.5-1 mLs (10-20 mg total) by mouth every hour as needed for severe pain. 06/26/16   Henreitta Leber, MD  Nutritional Supplements (FEEDING SUPPLEMENT, OSMOLITE 1.5 CAL,) LIQD Place 1,000 mLs into feeding tube continuous. 06/26/16    Henreitta Leber, MD  simvastatin (ZOCOR) 40 MG tablet Take 40 mg by mouth daily at 6 PM.     Historical Provider, MD  SPIRIVA HANDIHALER 18 MCG inhalation capsule Place 18 mcg into inhaler and inhale daily.  05/25/16   Historical Provider, MD  Water For Irrigation, Sterile (FREE WATER) SOLN Place 200 mLs into feeding tube every 8 (eight) hours. 06/26/16   Henreitta Leber, MD      PHYSICAL EXAMINATION:   VITAL SIGNS: Blood pressure (!) 116/42, pulse (!) 104, temperature 97.4 F (36.3 C), temperature source Oral, resp. rate 19, weight 80.3 kg (177 lb), SpO2 100 %.  GENERAL:  70 y.o.-year-old patient lying in the bed In mild respiratory distress distress, coughing intermittently, however. Cough is insufficient, unable to lift up any phlegm, wheezing from the distance and choking on her own phlegm,  EYES: Pupils equal, round, reactive to light and accommodation. No scleral icterus. Extraocular muscles intact.  HEENT: Head atraumatic, normocephalic. Oropharynx and nasopharynx clear.  NECK:  Supple, no jugular venous distention. No thyroid enlargement, no tenderness.  LUNGS: Normal breath sounds bilaterally,  diffuse and wheezing, rales,rhonchi or crepitations all lung fields anteriorly.  coughing intermittently and choking on her own phlegm.  CARDIOVASCULAR: S1, S2 normal. No murmurs, rubs, or gallops.  ABDOMEN: Soft, tender diffusely, however, it appears to be due to superficial subcutaneous hematomas all over her abdominal wall, nondistended. Bowel sounds present, diminished. No organomegaly or mass. G-tube was noted in upper abdomen EXTREMITIES: No pedal edema, cyanosis, or clubbing.  NEUROLOGIC: Cranial nerves II through XII are intact. Muscle strength 5/5 in all extremities. Sensation intact. Gait not checked.  PSYCHIATRIC: The patient is somnolent, but able to open her eyes and conversed briefly, answers questions appropriately, follows commands, oriented x 3.  SKIN: No obvious rash,  lesion, or ulcer, except for superficial bruising in abdominal wall subcutaneous tissues.   LABORATORY PANEL:   CBC  Last Labs    Recent Labs Lab 08/17/16 1359  WBC 7.8  HGB 11.1*  HCT 34.3*  PLT 215  MCV 99.1  MCH 32.1  MCHC 32.4  RDW 17.9*  LYMPHSABS 0.6*  MONOABS 0.2  EOSABS 0.0  BASOSABS 0.0     ------------------------------------------------------------------------------------------------------------------  Chemistries   Last Labs    Recent Labs Lab 08/17/16 1359  NA 139  K 2.7*  CL 96*  CO2 33*  GLUCOSE 408*  BUN 55*  CREATININE 0.71  CALCIUM 9.7  AST 31  ALT 35  ALKPHOS 187*  BILITOT 0.6     ------------------------------------------------------------------------------------------------------------------  Cardiac Enzymes  Last Labs    Recent Labs Lab 08/17/16 1359  TROPONINI 0.06*     ------------------------------------------------------------------------------------------------------------------  RADIOLOGY:  Imaging Results (Last 48 hours)  Dg Chest 2 View  Result Date: 08/17/2016 CLINICAL DATA:  Low blood pressure, uncooperative somewhat unresponsive patient, former smoking history EXAM: CHEST  2 VIEW COMPARISON:  Chest x-ray of 06/19/2016 FINDINGS: There is parenchymal opacity within the right mid upper lung most consistent with pneumonia. There is a small cavitary lesion in the left lower lobe with air-fluid level possibly and infected bullus. No pleural effusion is seen. The film is somewhat rotated. The heart is mildly enlarged and stable. IMPRESSION: 1. Patchy opacity in the right mid upper lung most consistent with pneumonia. 2. Cavitary lesion with thin walls in the left lower lobe with air-fluid level most consistent with an infected bullus. CT of the chest would be helpful if further assessment is warranted . Electronically Signed   By: Ivar Drape M.D.   On: 08/17/2016 14:36   Ct Chest W Contrast  Result Date:  08/17/2016 CLINICAL DATA:  Low blood pressure today. Pneumonia. Coughing for 2 weeks. EXAM: CT CHEST WITH CONTRAST TECHNIQUE: Multidetector CT imaging of the chest was performed during intravenous contrast administration. CONTRAST:  9mL ISOVUE-300 IOPAMIDOL (ISOVUE-300) INJECTION 61% COMPARISON:  03/08/2016 FINDINGS: Cardiovascular: Heart size is upper normal. Coronary artery calcifications are present. Thoracic aorta is atherosclerotic. No aneurysm. Bovine arch anatomy noted. The pulmonary arteries are grossly well opacified. Mediastinum/Nodes: No mediastinal, hilar, or axillary adenopathy. Esophagus is normal. Lungs/Pleura: There has been significant improvement in aeration of the lungs since 03/08/2016. Within the left lower lobe there is a small cavitary lesion measuring 2.2 x 3.4 cm. This is a new finding since prior study. Multiple pulmonary infiltrates have resolved. Faint infiltrates persist in the upper lobes. Scattered calcified granulomata are present. A nodule in the right lower lobe is 5 x 6 mm on image 96 of series 3. More posteriorly in the right lower lobe, the density is 10 x 17 mm. A nodule in the lingula is 5 x 4 mm. Upper Abdomen: Patient has a gastrostomy tube. Gallbladder is present. Musculoskeletal: Visualized osseous structures have a normal appearance. IMPRESSION: 1. Interval development of cavitary lesion in the left lower lobe. 2. Persistent faint patchy densities in the upper lobes bilaterally. Findings favor infectious process. 3. Coronary artery disease. 4. Atherosclerosis of the thoracic aorta. 5. Gastrostomy tube. Electronically Signed   By: Nolon Nations M.D.   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 Sepsis, initiated on broad-spectrum antibiotic therapy, get blood cultures, urine cultures, sputum cultures, adjust antibiotics depending on culture result.  #2. Healthcare associated pneumonia, initiate vancomycin and Zosyn, get sputum cultures, percussion vest to help with expectorations, #3. Metabolic encephalopathy, likely infection, hypotension related, get ABGs to rule out CO2 retention, patient was agreeable for BiPAP CPAP administration, if needed, but not intubation. #4 elevated troponin, likely demand ischemia, follow cardiac enzymes 3, no beta blockers, nitroglycerin due to hypotension, continue aspirin therapy, Lovenox #5. Patient is Limited code per her own request, discussed with her extensively in the presence of hospice nurse Santiago Glad    I have personally obtained a history, examined the patient, evaluated  Pertinent laboratory and RadioGraphic/imaging results, and  formulated the assessment and plan   The Patient requires high complexity decision making for assessment and support, frequent evaluation and titration of therapies.   [name], M.D.  [facility]  Medical Director [place] Director [facility] Cardio-Pulmonary Department

## 2016-08-18 NOTE — Progress Notes (Signed)
pts heartrate  Increased  And maintained.  120s 130s  Then it was all over the place showing 170s , 190s.  150s dr Gus Height patel notified  And ekg performed. Hr  Was 125 to 134.  Pt asympotmatic.   Will call md with results.

## 2016-08-18 NOTE — Progress Notes (Signed)
Inpatient Diabetes Program Recommendations  AACE/ADA: New Consensus Statement on Inpatient Glycemic Control (2015)  Target Ranges:  Prepandial:   less than 140 mg/dL      Peak postprandial:   less than 180 mg/dL (1-2 hours)      Critically ill patients:  140 - 180 mg/dL   Lab Results  Component Value Date   GLUCAP 347 (H) 08/18/2016   HGBA1C 10.5 (H) 06/05/2016    Review of Glycemic Control  Results for Ebony [name], Ebony [name] (MRN [id number]) as of 08/18/2016 09:04  Ref. Range 08/17/2016 18:27 08/17/2016 18:36 08/17/2016 22:07 08/18/2016 00:26 08/18/2016 03:58  Glucose-Capillary Latest Ref Range: 65 - 99 mg/dL 478 (H) 466 (H) 423 (H) 386 (H) 347 (H)    Diabetes history:DM2  Outpatient Diabetes medications: Glipizide 5mg  daily, Levemir 40units QHS, Humalog 20 units TID with meals, Invokana 100 mg daily, Humalog 4 units q4h (from patient home med list)  Current orders for Inpatient glycemic control: Levemir 40units QHS, Novolog 4 units Q4H for tube feeding coverage, Novolog 0-9 units Q4H  Inpatient Diabetes Program Recommendations:  Please consider increasing Novolog correction insulin to resistant correction Novolog 0-20 units q4h.  May need to increase Levemir since fasting blood sugar is elevated- consider Levemir 50 units qhs.   Gentry Fitz, RN, BA, MHA, CDE Diabetes Coordinator Inpatient Diabetes Program  [phone] (Team Pager) [phone] ([place]) 08/18/2016 9:09 AM

## 2016-08-18 NOTE — Progress Notes (Signed)
Visit made. Patient seen lying on her side asleep, son TJ at bedside. Tube feeding noted to be running, per TJ patient has not been receiving continuous feeds for quite some time, this was verified with her hospice nurse. Also of note patient had been receiving cipro via her peg with last dose being due today, she also has been receiving 60 ml of free water bolus q 3 hrs. She also reports that patient is total care and sleeps most of the time, and has a poor appetite. Writer spoke with attending physician Dr. Posey Pronto to advise that patient does not receive continuous feeds. Dietician to contact [place] for current diet order. Staff RN Freddie Breech made aware.  Infectious disease consult pending. Possible discharge tomorrow back to [facility] per attending. Will continue to follow and update hospice team. Flo Shanks, RN, BSN, [place] [facility] [place], [facility] [phone] c

## 2016-08-19 LAB — BLOOD CULTURE ID PANEL (REFLEXED)
ACINETOBACTER BAUMANNII: NOT DETECTED
CANDIDA GLABRATA: NOT DETECTED
CANDIDA KRUSEI: NOT DETECTED
CANDIDA PARAPSILOSIS: NOT DETECTED
CARBAPENEM RESISTANCE: NOT DETECTED
Candida albicans: NOT DETECTED
Candida tropicalis: NOT DETECTED
ENTEROBACTERIACEAE SPECIES: NOT DETECTED
ENTEROCOCCUS SPECIES: NOT DETECTED
Enterobacter cloacae complex: NOT DETECTED
Escherichia coli: NOT DETECTED
Haemophilus influenzae: NOT DETECTED
KLEBSIELLA PNEUMONIAE: NOT DETECTED
Klebsiella oxytoca: NOT DETECTED
Listeria monocytogenes: NOT DETECTED
Methicillin resistance: NOT DETECTED
NEISSERIA MENINGITIDIS: NOT DETECTED
PSEUDOMONAS AERUGINOSA: NOT DETECTED
Proteus species: NOT DETECTED
STAPHYLOCOCCUS SPECIES: NOT DETECTED
STREPTOCOCCUS AGALACTIAE: NOT DETECTED
STREPTOCOCCUS PNEUMONIAE: NOT DETECTED
STREPTOCOCCUS SPECIES: NOT DETECTED
Serratia marcescens: NOT DETECTED
Staphylococcus aureus (BCID): NOT DETECTED
Streptococcus pyogenes: NOT DETECTED
Vancomycin resistance: NOT DETECTED

## 2016-08-19 LAB — GLUCOSE, CAPILLARY
GLUCOSE-CAPILLARY: 149 mg/dL — AB (ref 65–99)
GLUCOSE-CAPILLARY: 353 mg/dL — AB (ref 65–99)
Glucose-Capillary: 214 mg/dL — ABNORMAL HIGH (ref 65–99)
Glucose-Capillary: 231 mg/dL — ABNORMAL HIGH (ref 65–99)

## 2016-08-19 LAB — BASIC METABOLIC PANEL
Anion gap: 5 (ref 5–15)
BUN: 25 mg/dL — AB (ref 6–20)
CHLORIDE: 109 mmol/L (ref 101–111)
CO2: 31 mmol/L (ref 22–32)
Calcium: 9.5 mg/dL (ref 8.9–10.3)
Creatinine, Ser: 0.37 mg/dL — ABNORMAL LOW (ref 0.44–1.00)
GFR calc Af Amer: >60 mL/min (ref >60)
GFR calc non Af Amer: >60 mL/min (ref >60)
GLUCOSE: 218 mg/dL — AB (ref 65–99)
POTASSIUM: 3.8 mmol/L (ref 3.5–5.1)
Sodium: 145 mmol/L (ref 135–145)

## 2016-08-19 LAB — HEMOGLOBIN A1C
Hgb A1c MFr Bld: 9.2 % — ABNORMAL HIGH (ref 4.8–5.6)
MEAN PLASMA GLUCOSE: 217 mg/dL

## 2016-08-19 MED ORDER — DAKINS (1/4 STRENGTH) 0.125 % EX SOLN: Freq: Every day | CUTANEOUS | 0 refills | Status: AC

## 2016-08-19 MED ORDER — METRONIDAZOLE 500 MG PO TABS: 500.0000 mg | ORAL_TABLET | Freq: Three times a day (TID) | ORAL | 0 refills | Status: AC

## 2016-08-19 MED ORDER — CIPROFLOXACIN HCL 500 MG PO TABS: 500.0000 mg | ORAL_TABLET | Freq: Two times a day (BID) | ORAL | 0 refills | Status: AC

## 2016-08-19 MED ORDER — PRO-STAT SUGAR FREE PO LIQD: 30.0000 mL | Freq: Three times a day (TID) | ORAL | 0 refills | Status: AC

## 2016-08-19 MED ORDER — GUAIFENESIN ER 600 MG PO TB12: 600.0000 mg | ORAL_TABLET | Freq: Two times a day (BID) | ORAL | 0 refills | Status: AC

## 2016-08-19 NOTE — NC FL2 (Signed)
[place] LEVEL OF CARE SCREENING TOOL     IDENTIFICATION  Patient Name: Ebony [name] Birthdate: April 26, 1946 Sex: female Admission Date (Current Location): 08/17/2016  [place] and [place] Number:  Engineering geologist and Address:  [facility], [address] [place], [place] [address]      Provider Number: [id number]  Attending Physician Name and Address:  Fritzi Mandes, MD  Relative Name and Phone Number:       Current Level of Care: Hospital Recommended Level of Care: [place] Prior Approval Number:    Date Approved/Denied: 04/07/16 PASRR Number: [id number]  Discharge Plan: SNF    Current Diagnoses: Patient Active Problem List   Diagnosis Date Noted  . Pressure injury of skin 08/18/2016  . Protein-calorie malnutrition, severe 08/18/2016  . Sepsis ([place]) 08/17/2016  . Healthcare-associated pneumonia 08/17/2016  . Pneumonia 08/17/2016  . Diverticulitis of colon 08/14/2016  . Hypoglycemic shock ([place]) 07/31/2016  . Depression 07/31/2016  . Chronic obstructive pulmonary disease with acute lower resp infection ([place]) 07/23/2016  . Diabetes mellitus ([place]) 07/19/2016  . Generalized anxiety disorder 07/10/2016  . Hypotension 06/08/2016  . Pressure ulcer 03/18/2016  . Problems with swallowing and mastication   . COPD with exacerbation ([place])   . Mild malnutrition ([place])   . History of infection not responsive to antibiotic therapy   . Acute respiratory failure ([place]) 02/27/2016  . Aftercare following surgery   . Acute respiratory failure with hypoxia ([place]) 10/07/2015  . COPD exacerbation ([place]) 10/07/2015  . Community acquired pneumonia 10/07/2015  . Hyperkalemia 10/07/2015  . Breast cancer, stage 1 ([place])   . Cancer of left female breast  ([place]) 09/09/2015  . Mild chronic obstructive pulmonary disease ([place]) 06/25/2014  . Inflamed nasal mucosa 06/25/2014  . Apnea, sleep 06/25/2014    Orientation RESPIRATION  BLADDER Height & Weight     Self, Time, Situation  O2 (O2 2L) Incontinent Weight: 185 lb 8 oz (84.1 kg) Height:  5\' 2"  (157.5 cm)  BEHAVIORAL SYMPTOMS/MOOD NEUROLOGICAL BOWEL NUTRITION STATUS      Incontinent    AMBULATORY STATUS COMMUNICATION OF NEEDS Skin   Total Care Verbally Normal                       Personal Care Assistance Level of Assistance  Bathing, Dressing, Total care Bathing Assistance: Maximum assistance   Dressing Assistance: Maximum assistance Total Care Assistance: Maximum assistance   Functional Limitations Info             SPECIAL CARE FACTORS FREQUENCY        PT Frequency: Hospice is following.              Contractures Contractures Info: Present    Additional Factors Info  Allergies   Allergies Info: Shellfish           Current Medications (08/19/2016):  This is the current hospital active medication list Current Facility-Administered Medications  Medication Dose Route Frequency Provider Last Rate Last Dose  . acetaminophen (TYLENOL) tablet 650 mg  650 mg Oral Q6H PRN Fritzi Mandes, MD      . albuterol (PROVENTIL) (2.5 MG/3ML) 0.083% nebulizer solution 2.5 mg  2.5 mg Nebulization Q2H PRN Theodoro Grist, MD      . aspirin EC tablet 81 mg  81 mg Oral Hulen Skains, MD   81 mg at 08/19/16 0535  . buPROPion (WELLBUTRIN XL) 24 hr tablet 150 mg  150 mg Oral BH-q7a  Theodoro Grist, MD   150 mg at 08/19/16 0534  . ciprofloxacin (CIPRO) tablet 500 mg  500 mg Oral BID Loree Fee, RPH   500 mg at 08/19/16 0836  . clonazePAM (KLONOPIN) disintegrating tablet 0.25 mg  0.25 mg Oral Q12H Theodoro Grist, MD   0.25 mg at 08/19/16 0837  . dexamethasone (DECADRON) tablet 4 mg  4 mg Oral Daily Fritzi Mandes, MD   4 mg at 08/19/16 0836  . enoxaparin (LOVENOX) injection 40 mg  40 mg Subcutaneous Q24H Theodoro Grist, MD   40 mg at 08/18/16 2209  . escitalopram (LEXAPRO) tablet 20 mg  20 mg Oral Daily Theodoro Grist, MD   20 mg at 08/19/16 0837  .  famotidine (PEPCID) 40 MG/5ML suspension 20 mg  20 mg Per Tube Daily Theodoro Grist, MD   20 mg at 08/19/16 0838  . feeding supplement (OSMOLITE 1.5 CAL) liquid 237 mL  237 mL Per Tube QID Fritzi Mandes, MD   237 mL at 08/19/16 0842  . feeding supplement (PRO-STAT SUGAR FREE 64) liquid 30 mL  30 mL Per Tube TID Fritzi Mandes, MD   30 mL at 08/19/16 0842  . gabapentin (NEURONTIN) capsule 100 mg  100 mg Oral QHS Theodoro Grist, MD   100 mg at 08/18/16 2209  . guaiFENesin (MUCINEX) 12 hr tablet 600 mg  600 mg Oral BID Theodoro Grist, MD   600 mg at 08/19/16 0835  . insulin aspart (novoLOG) injection 0-20 Units  0-20 Units Subcutaneous TID WC Fritzi Mandes, MD   3 Units at 08/19/16 [id number]  . insulin aspart (novoLOG) injection 4 Units  4 Units Subcutaneous Q4H Fritzi Mandes, MD   4 Units at 08/19/16 0827  . insulin detemir (LEVEMIR) injection 50 Units  50 Units Subcutaneous QHS Fritzi Mandes, MD   50 Units at 08/18/16 2206  . ipratropium-albuterol (DUONEB) 0.5-2.5 (3) MG/3ML nebulizer solution 3 mL  3 mL Nebulization Q4H Flora Lipps, MD   3 mL at 08/19/16 0804  . letrozole [facility]) tablet 2.5 mg  2.5 mg Oral Daily Theodoro Grist, MD   2.5 mg at 08/19/16 0836  . levothyroxine (SYNTHROID, LEVOTHROID) tablet 200 mcg  200 mcg Oral QAC breakfast Theodoro Grist, MD   200 mcg at 08/19/16 0534   And  . levothyroxine (SYNTHROID, LEVOTHROID) tablet 50 mcg  50 mcg Oral QAC breakfast Theodoro Grist, MD   50 mcg at 08/19/16 0536  . metroNIDAZOLE (FLAGYL) tablet 500 mg  500 mg Oral Q8H Leonel Ramsay, MD   500 mg at 08/19/16 0403  . mometasone-formoterol (DULERA) 200-5 MCG/ACT inhaler 2 puff  2 puff Inhalation BID Fritzi Mandes, MD   2 puff at 08/19/16 [id number]  . morphine CONCENTRATE 10 MG/0.5ML oral solution 10-20 mg  10-20 mg Oral Q1H PRN Fritzi Mandes, MD   20 mg at 08/18/16 1613  . ondansetron (ZOFRAN) tablet 4 mg  4 mg Oral Q6H PRN Theodoro Grist, MD       Or  . ondansetron (ZOFRAN) injection 4 mg  4 mg Intravenous Q6H PRN Theodoro Grist, MD       . potassium chloride (KLOR-CON) packet 40 mEq  40 mEq Oral BID Fritzi Mandes, MD   40 mEq at 08/19/16 0841  . simvastatin (ZOCOR) tablet 40 mg  40 mg Oral q1800 Theodoro Grist, MD   40 mg at 08/18/16 2211  . sodium chloride flush (NS) 0.9 % injection 3 mL  3 mL Intravenous Q12H Theodoro Grist, MD   3  mL at 08/18/16 2213  . sodium hypochlorite (DAKIN'S 1/4 STRENGTH) topical solution   Irrigation Q1500 Fritzi Mandes, MD      . tiotropium (SPIRIVA) inhalation capsule 18 mcg  18 mcg Inhalation Daily Theodoro Grist, MD   18 mcg at 08/19/16 [id number]     Discharge Medications: Please see discharge summary for a list of discharge medications.  Relevant Imaging Results:  Relevant Lab Results:   Additional Information  SS [id number]  Zettie Pho, LCSW

## 2016-08-19 NOTE — Discharge Instructions (Signed)
Foley Care per Protocol.  Pt has Foley due to her non healing decubitus.Marland KitchenShe will f/u at the wound center for it. Once her decubitus improves then consider D/c foley. Will defer thi.s to NH MD

## 2016-08-19 NOTE — Discharge Summary (Signed)
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 Asthma   . Breast cancer ([place])   . Carpal tunnel syndrome   . COPD (chronic obstructive pulmonary disease) ([place])   . Depression   . Diabetes mellitus type 2, uncomplicated ([place])   . Hyperlipidemia   . Hypertension   . Hypothyroidism   . Osteoporosis, post-menopausal   . Psoriasis   . Sleep apnea   . Vitamin D deficiency     HOSPITAL COURSE:  [name] a 70 y.o.femalewith a known history of Multiple medical problems including admission for cavitating pneumonia in August 2017 due to Pseudomonas infection, malnutrition, status post PEG tube placement, COPD, diabetes, breast cancer, hypertension, hyperlipidemia, hypothyroidism, diarrhea, who presents to the hospital with complaints of cough, yellow sputum. Sputum production for the past 2 weeks, worsening shortness of breath. Patient admits of feeling feverish and chilly, very weak over the past few weeks,   #1. Known history of cavitary lesion in the left lung. Patient just finished course of antibiotic in August 2017  -DC vancomycin since MRSA PCR 08/18/2016 is negative  -Patient clinically does not look septic. Her white count is normal. CT  chest reviewed again with the radiologist and hardly cavitary lung lesions seem to be improved.  -Empirically on IV Zosyn---d/ced -Spoke with Dr. Ola Spurr who knows patient well-dose not recommend any furhter rx  #2. Hypokalemia -repleted  #3 elevated troponin, likely demand ischemia, follow cardiac enzymes 3, no beta blockers, nitroglycerin due to hypotension, continue aspirin therapy -Patient denies any chest pain. We'll hold off on any further workup at present  #4 protein calorie malnutrition.  -Patient has been taking diet orally. However dietitian feels she is still severely protein calorie malnutrition hence will start some PEG feeding bolus after discussing with husband -started of bolus peg feeding  #5 severely morbid obesity bedbound with decubitus ulcer  -now has foley to help with healing. Will leave it for now -pt to f/u WC and cont dressing changes as instructed  #6h/o diverticulitis -patient on Cipro and Flagyl which was started at [facility]. Last dose on 08/21/16  Overall at baseline  [facility]   CONSULTS OBTAINED:  Treatment Team:  Flora Lipps, MD Leonel Ramsay, MD [facility], MD  DRUG ALLERGIES:   Allergies  Allergen Reactions  . Other Hives and Rash    Shellfish. Shellfish Pt reports no allergic reaction to iodine or betadine.  . Shellfish Allergy Hives and Rash    DISCHARGE MEDICATIONS:   Current Discharge Medication List    START taking these medications   Details  Amino Acids-Protein Hydrolys (FEEDING SUPPLEMENT, PRO-STAT SUGAR FREE 64,) LIQD Place 30 mLs into feeding tube 3 (three) times daily. Qty: 900 mL, Refills: 0    ciprofloxacin (CIPRO) 500 MG tablet Take 1 tablet (500 mg total) by mouth 2 (two)  times daily. Qty: 4 tablet, Refills: 0    guaiFENesin (MUCINEX) 600 MG 12 hr tablet Take 1 tablet (600 mg total) by mouth 2 (two) times daily. Qty: 14 tablet, Refills: 0    metroNIDAZOLE (FLAGYL) 500 MG tablet Take 1  tablet (500 mg total) by mouth every 8 (eight) hours. Qty: 6 tablet, Refills: 0    sodium hypochlorite (DAKIN'S 1/4 STRENGTH) 0.125 % SOLN Irrigate with as directed daily at 3 pm. Qty: 1 Bottle, Refills: 0      CONTINUE these medications which have NOT CHANGED   Details  Adalimumab (HUMIRA PEN-PSORIASIS STARTER) 40 MG/0.8ML PNKT Inject 40 mg into the skin every 14 (fourteen) days.     albuterol (PROVENTIL) (2.5 MG/3ML) 0.083% nebulizer solution Inhale 1 vial using nebulizer every six hours as needed for shortness of breath and/or wheezing.    aspirin EC 81 MG tablet Take 81 mg by mouth every morning.     BREO ELLIPTA 100-25 MCG/INH AEPB Inhale 2 puffs into the lungs daily.    buPROPion (WELLBUTRIN XL) 150 MG 24 hr tablet Take 150 mg by mouth every morning.     clobetasol (TEMOVATE) 0.05 % external solution Apply 1 application topically daily as needed (for psoriasis.).     clonazePAM (KLONOPIN) 0.25 MG disintegrating tablet Take 1 tablet by mouth every 12 (twelve) hours.     dexamethasone (DECADRON) 4 MG tablet Take 4 mg by mouth daily.    escitalopram (LEXAPRO) 20 MG tablet Take 20 mg by mouth daily.    famotidine (PEPCID) 40 MG/5ML suspension Place 2.5 mLs (20 mg total) into feeding tube daily. Qty: 50 mL, Refills: 0    furosemide (LASIX) 20 MG tablet Take 20 mg by mouth 2 (two) times daily.    gabapentin (NEURONTIN) 100 MG capsule Take 100 mg by mouth at bedtime.    glipiZIDE (GLUCOTROL) 5 MG tablet Take 1 tablet by mouth daily before lunch.     HUMALOG 100 UNIT/ML injection Inject 20 Units into the skin 3 (three) times daily with meals.     insulin aspart (NOVOLOG) 100 UNIT/ML injection Inject 4 Units into the skin every 4 (four) hours. Qty: 10 mL, Refills: 11    insulin detemir (LEVEMIR) 100 UNIT/ML injection Inject 40 Units into the skin at bedtime.    INVOKANA 100 MG TABS tablet Take 100 mg by mouth daily.     letrozole (FEMARA) 2.5 MG tablet Take 2.5 mg by mouth  daily.    !! levothyroxine (SYNTHROID, LEVOTHROID) 200 MCG tablet Take 200 mcg by mouth daily before breakfast. *Take along with 50 mcg to equal 250 mcg total dose*    !! levothyroxine (SYNTHROID, LEVOTHROID) 50 MCG tablet Take 50 mcg by mouth daily before breakfast. *Take along with 200 mcg tablet to equal 250 mcg total dose.*    losartan-hydrochlorothiazide (HYZAAR) 100-25 MG tablet Take 1 tablet by mouth daily. In am.    metoprolol (LOPRESSOR) 50 MG tablet Take 50 mg by mouth 2 (two) times daily.    mirtazapine (REMERON) 15 MG tablet Take 7.5 tablets by mouth daily.    simvastatin (ZOCOR) 40 MG tablet Take 40 mg by mouth daily at 6 PM.     SPIRIVA HANDIHALER 18 MCG inhalation capsule Place 18 mcg into inhaler and inhale daily.     Morphine Sulfate (MORPHINE CONCENTRATE) 10 MG/0.5ML SOLN concentrated solution Take 0.5-1 mLs (10-20 mg total) by mouth every hour as needed for severe pain. Qty: 180 mL, Refills: 0  Nutritional Supplements (FEEDING SUPPLEMENT, OSMOLITE 1.5 CAL,) LIQD Place 1,000 mLs into feeding tube continuous. Refills: 0    Water For Irrigation, Sterile (FREE WATER) SOLN Place 200 mLs into feeding tube every 8 (eight) hours.     !! - Potential duplicate medications found. Please discuss with provider.    STOP taking these medications     albuterol (PROAIR HFA) 108 (90 BASE) MCG/ACT inhaler      budesonide-formoterol (SYMBICORT) 160-4.5 MCG/ACT inhaler         If you experience worsening of your admission symptoms, develop shortness of breath, life threatening emergency, suicidal or homicidal thoughts you must seek medical attention immediately by calling 911 or calling your MD immediately  if symptoms less severe.  You Must read complete instructions/literature along with all the possible adverse reactions/side effects for all the Medicines you take and that have been prescribed to you. Take any new Medicines after you have completely understood and accept all  the possible adverse reactions/side effects.   Please note  You were cared for by a hospitalist during your hospital stay. If you have any questions about your discharge medications or the care you received while you were in the hospital after you are discharged, you can call the unit and asked to speak with the hospitalist on call if the hospitalist that took care of you is not available. Once you are discharged, your primary care physician will handle any further medical issues. Please note that NO REFILLS for any discharge medications will be authorized once you are discharged, as it is imperative that you return to your primary care physician (or establish a relationship with a primary care physician if you do not have one) for your aftercare needs so that they can reassess your need for medications and monitor your lab values. Today   SUBJECTIVE    No complaints VITAL SIGNS:  Blood pressure 136/72, pulse (!) 107, temperature 98.3 F (36.8 C), temperature source Oral, resp. rate 16, height 5\' 2"  (1.575 m), weight 84.1 kg (185 lb 8 oz), SpO2 100 %.  I/O:   Intake/Output Summary (Last 24 hours) at 08/19/16 0808 Last data filed at 08/18/16 2209  Gross per 24 hour  Intake              667 ml  Output              725 ml  Net              -58 ml    PHYSICAL EXAMINATION:  GENERAL:  70 y.o.-year-old patient lying in the bed with no acute distress. Chronically ill, severe obese EYES: Pupils equal, round, reactive to light and accommodation. No scleral icterus. Extraocular muscles intact.  HEENT: Head atraumatic, normocephalic. Oropharynx and nasopharynx clear.  NECK:  Supple, no jugular venous distention. No thyroid enlargement, no tenderness.  LUNGS: decreased breath sounds bilaterally, no wheezing, rales,rhonchi or crepitation. No use of accessory muscles of respiration.  CARDIOVASCULAR: S1, S2 normal. No murmurs, rubs, or gallops.  ABDOMEN: Soft, non-tender, non-distended. Bowel sounds  present. No organomegaly or mass. Foley+ EXTREMITIES: +pedal edema, no cyanosis, or clubbing.  NEUROLOGIC: Cranial nerves II through XII are intact. Muscle strength 5/5 in all extremities. Sensation intact. Gait not checked.  PSYCHIATRIC: The patient is alert and oriented x 3.  SKIN: No obvious rash, lesion -sacral decubitus ++ chronic  DATA REVIEW:   CBC   Recent Labs Lab 08/18/16 0644  WBC 7.2  HGB 10.3*  HCT 30.3*  PLT 190    Chemistries   Recent Labs Lab 08/17/16 1359 08/17/16 1953  08/19/16 0412  NA 139  --   < > 145  K 2.7* 2.9*  < > 3.8  CL 96*  --   < > 109  CO2 33*  --   < > 31  GLUCOSE 408*  --   < > 218*  BUN 55*  --   < > 25*  CREATININE 0.71  --   < > 0.37*  CALCIUM 9.7  --   < > 9.5  MG  --  1.6*  --   --   AST 31  --   --   --   ALT 35  --   --   --   ALKPHOS 187*  --   --   --   BILITOT 0.6  --   --   --   < > = values in this interval not displayed.  Microbiology Results   Recent Results (from the past 240 hour(s))  C difficile quick scan w PCR reflex     Status: Abnormal   Collection Time: 08/17/16  1:44 PM  Result Value Ref Range Status   C Diff antigen POSITIVE (A) NEGATIVE Final   C Diff toxin NEGATIVE NEGATIVE Final   C Diff interpretation Results are indeterminate. See PCR results.  Final  Clostridium Difficile by PCR     Status: None   Collection Time: 08/17/16  1:44 PM  Result Value Ref Range Status   Toxigenic C Difficile by pcr NEGATIVE NEGATIVE Final    Comment: Patient is colonized with non toxigenic C. difficile. May not need treatment unless significant symptoms are present.  Culture, blood (routine x 2)     Status: None (Preliminary result)   Collection Time: 08/17/16  2:01 PM  Result Value Ref Range Status   Specimen Description BLOOD RIGHT AC  Final   Special Requests BOTTLES DRAWN AEROBIC AND ANAEROBIC AER8CC ANA5CC  Final   Culture NO GROWTH < 24 HOURS  Final   Report Status PENDING  Incomplete  Culture, blood (routine  x 2)     Status: None (Preliminary result)   Collection Time: 08/17/16  2:01 PM  Result Value Ref Range Status   Specimen Description BLOOD RIGHT AC  Final   Special Requests   Final    BOTTLES DRAWN AEROBIC AND ANAEROBIC AER 8CC ANA 5CC   Culture NO GROWTH < 24 HOURS  Final   Report Status PENDING  Incomplete  Urine culture     Status: None (Preliminary result)   Collection Time: 08/17/16  5:39 PM  Result Value Ref Range Status   Specimen Description URINE, RANDOM  Final   Special Requests NONE  Final   Culture   Final    CULTURE REINCUBATED FOR BETTER GROWTH Performed at [facility]    Report Status PENDING  Incomplete  MRSA PCR Screening     Status: None   Collection Time: 08/17/16  8:06 PM  Result Value Ref Range Status   MRSA by PCR NEGATIVE NEGATIVE Final    Comment:        The GeneXpert MRSA Assay (FDA approved for NASAL specimens only), is one component of a comprehensive MRSA colonization surveillance program. It is not intended to diagnose MRSA infection nor to guide or monitor treatment for MRSA infections.     RADIOLOGY:  Dg Chest 2 View  Result Date: 08/17/2016 CLINICAL DATA:  Low blood pressure,  uncooperative somewhat unresponsive patient, former smoking history EXAM: CHEST  2 VIEW COMPARISON:  Chest x-ray of 06/19/2016 FINDINGS: There is parenchymal opacity within the right mid upper lung most consistent with pneumonia. There is a small cavitary lesion in the left lower lobe with air-fluid level possibly and infected bullus. No pleural effusion is seen. The film is somewhat rotated. The heart is mildly enlarged and stable. IMPRESSION: 1. Patchy opacity in the right mid upper lung most consistent with pneumonia. 2. Cavitary lesion with thin walls in the left lower lobe with air-fluid level most consistent with an infected bullus. CT of the chest would be helpful if further assessment is warranted . Electronically Signed   By: Ivar Drape M.D.   On:  08/17/2016 14:36   Ct Chest W Contrast  Addendum Date: 08/18/2016   ADDENDUM REPORT: 08/18/2016 10:36 ADDENDUM: Comparison is made with CT of the abdomen and pelvis performed on 06/08/2016: Since the exam of the abdomen, the cavitary lesion at the left lung base has improved significantly, most consistent with resolving infectious or inflammatory process. Non-contrast chest CT at 3-6 months is recommended. Electronically Signed   By: Nolon Nations M.D.   On: 08/18/2016 10:36   Result Date: 08/18/2016 CLINICAL DATA:  Low blood pressure today. Pneumonia. Coughing for 2 weeks. EXAM: CT CHEST WITH CONTRAST TECHNIQUE: Multidetector CT imaging of the chest was performed during intravenous contrast administration. CONTRAST:  34mL ISOVUE-300 IOPAMIDOL (ISOVUE-300) INJECTION 61% COMPARISON:  03/08/2016 FINDINGS: Cardiovascular: Heart size is upper normal. Coronary artery calcifications are present. Thoracic aorta is atherosclerotic. No aneurysm. Bovine arch anatomy noted. The pulmonary arteries are grossly well opacified. Mediastinum/Nodes: No mediastinal, hilar, or axillary adenopathy. Esophagus is normal. Lungs/Pleura: There has been significant improvement in aeration of the lungs since 03/08/2016. Within the left lower lobe there is a small cavitary lesion measuring 2.2 x 3.4 cm. This is a new finding since prior study. Multiple pulmonary infiltrates have resolved. Faint infiltrates persist in the upper lobes. Scattered calcified granulomata are present. A nodule in the right lower lobe is 5 x 6 mm on image 96 of series 3. More posteriorly in the right lower lobe, the density is 10 x 17 mm. A nodule in the lingula is 5 x 4 mm. Upper Abdomen: Patient has a gastrostomy tube. Gallbladder is present. Musculoskeletal: Visualized osseous structures have a normal appearance. IMPRESSION: 1. Interval development of cavitary lesion in the left lower lobe. 2. Persistent faint patchy densities in the upper lobes  bilaterally. Findings favor infectious process. 3. Coronary artery disease. 4. Atherosclerosis of the thoracic aorta. 5. Gastrostomy tube. Electronically Signed: By: Nolon Nations M.D. On: 08/17/2016 16:08     Management plans discussed with the patient, family and they are in agreement.  CODE STATUS:     Code Status Orders        Start     Ordered   08/17/16 1823  Full code  Continuous     08/17/16 1822    Code Status History    Date Active Date Inactive Code Status Order ID Comments User Context   08/17/2016  5:05 PM 08/17/2016  6:23 PM Partial Code [id number] Discussed this patient in the emergency room today, hospice nurse Santiago Glad was present during interview Theodoro Grist, MD ED   06/16/2016  9:54 AM 06/26/2016  1:21 PM Partial Code [id number]  Wilhelmina Mcardle, MD Inpatient   06/08/2016  4:16 AM 06/16/2016  9:53 AM Full Code [id number]  Harrie Foreman, MD Inpatient  02/27/2016 11:30 AM 03/22/2016  6:51 PM Full Code [id number]  Dustin Flock, MD ED   10/07/2015 11:30 AM 10/12/2015  5:35 PM Full Code [id number]  Theodoro Grist, MD Inpatient      TOTAL TIME TAKING CARE OF THIS PATIENT: 40 minutes.    Shevelle Smither M.D on 08/19/2016 at 8:08 AM  Between 7am to 6pm - Pager - [phone] After 6pm go to [email or web address] - password [email or web address] [facility]  Office  [phone]  CC: Primary care physician; Renee Rival, NP

## 2016-08-19 NOTE — Progress Notes (Signed)
PHARMACY - PHYSICIAN COMMUNICATION CRITICAL VALUE ALERT - BLOOD CULTURE IDENTIFICATION (BCID)   Results for orders placed or performed during the hospital encounter of 08/17/16  Blood Culture ID Panel (Reflexed) (Collected: 08/17/2016  2:01 PM)  Result Value Ref Range   Enterococcus species NOT DETECTED NOT DETECTED   Vancomycin resistance NOT DETECTED NOT DETECTED   Listeria monocytogenes NOT DETECTED NOT DETECTED   Staphylococcus species NOT DETECTED NOT DETECTED   Staphylococcus aureus NOT DETECTED NOT DETECTED   Methicillin resistance NOT DETECTED NOT DETECTED   Streptococcus species NOT DETECTED NOT DETECTED   Streptococcus agalactiae NOT DETECTED NOT DETECTED   Streptococcus pneumoniae NOT DETECTED NOT DETECTED   Streptococcus pyogenes NOT DETECTED NOT DETECTED   Acinetobacter baumannii NOT DETECTED NOT DETECTED   Enterobacteriaceae species NOT DETECTED NOT DETECTED   Enterobacter cloacae complex NOT DETECTED NOT DETECTED   Escherichia coli NOT DETECTED NOT DETECTED   Klebsiella oxytoca NOT DETECTED NOT DETECTED   Klebsiella pneumoniae NOT DETECTED NOT DETECTED   Proteus species NOT DETECTED NOT DETECTED   Serratia marcescens NOT DETECTED NOT DETECTED   Carbapenem resistance NOT DETECTED NOT DETECTED   Haemophilus influenzae NOT DETECTED NOT DETECTED   Neisseria meningitidis NOT DETECTED NOT DETECTED   Pseudomonas aeruginosa NOT DETECTED NOT DETECTED   Candida albicans NOT DETECTED NOT DETECTED   Candida glabrata NOT DETECTED NOT DETECTED   Candida krusei NOT DETECTED NOT DETECTED   Candida parapsilosis NOT DETECTED NOT DETECTED   Candida tropicalis NOT DETECTED NOT DETECTED   ASSESSMENT: 70 yo patient discharged on 08/19/2016. WBC: 7.2, afebrile, no clinical signs of infection.  Lab called pharmacy at 1750 on 10/7 with blood culture results of 1 of 2 bottles showing GPR. BCID did not detect anything.    Name of physician (or Provider) Contacted: Dr.  Ara Kussmaul  Changes to  prescribed antibiotics required: After discussion with MD, No changes or action taken at this time.   Nancy Fetter, PharmD Clinical Pharmacist 08/19/2016 6:58 PM

## 2016-08-19 NOTE — Plan of Care (Signed)
Problem: Skin Integrity: Goal: Risk for impaired skin integrity will decrease Outcome: Not Progressing Wound nurse consult done- has stg u pressure ulcer with continued wound care. Pt kept turned every 2 hours, but does refuse repositioning at intervals.

## 2016-08-19 NOTE — Progress Notes (Signed)
Dsicharged to [facility] via [facility]. EMS.

## 2016-08-19 NOTE — Progress Notes (Signed)
Pt tolerating bolus tube feedings with protein supplement. Diminished breath sounds. Loose incontinent stools with pressure ulcer dressings changed x 2 to replace.  Orders to discharge back to [facility] with case manager notifying family. Discharge packet prepared and ready.

## 2016-08-19 NOTE — Clinical Social Work Note (Signed)
Pt admitted from [place]. Pt returning today via non-emergent EMS and followed by [facility]. RNCM alerted [facility] that patient will dc today. The facility [facility] - admissions) and the family are aware. CSW will con't to follow pending any continued dc needs.  Santiago Bumpers, MSW, LCSW-A  [phone]

## 2016-08-19 NOTE — Progress Notes (Signed)
Tc report to Maurice Small who is known to him with pt accepted. EMS called for routing transport to [facility]. Pt prepared for transport.

## 2016-08-21 LAB — URINE CULTURE: Culture: 80000 — AB

## 2016-08-22 LAB — CULTURE, BLOOD (ROUTINE X 2): CULTURE: NO GROWTH

## 2016-08-23 ENCOUNTER — Non-Acute Institutional Stay (SKILLED_NURSING_FACILITY): Payer: Medicare Other | Admitting: Gerontology

## 2016-08-23 DIAGNOSIS — L89153 Pressure ulcer of sacral region, stage 3: Secondary | ICD-10-CM | POA: Diagnosis not present

## 2016-08-23 DIAGNOSIS — J449 Chronic obstructive pulmonary disease, unspecified: Secondary | ICD-10-CM | POA: Diagnosis not present

## 2016-08-29 ENCOUNTER — Non-Acute Institutional Stay (SKILLED_NURSING_FACILITY): Payer: Medicare Other | Admitting: Gerontology

## 2016-08-29 DIAGNOSIS — K9423 Gastrostomy malfunction: Secondary | ICD-10-CM | POA: Diagnosis not present

## 2016-08-29 DIAGNOSIS — J810 Acute pulmonary edema: Secondary | ICD-10-CM | POA: Diagnosis not present

## 2016-08-29 NOTE — Progress Notes (Signed)
Location:      Place of Service:  SNF (31) Provider:  Toni Arthurs, NP-C  Renee Rival, NP  Patient Care Team: Renee Rival, NP as PCP - General (Nurse Practitioner)  Extended Emergency Contact Information Primary Emergency Contact: [name] Address: [address]          [place], [place] [address] Ebony [name] of [place] Phone: [phone] Relation: Spouse Secondary Emergency Contact: [name] [place] of [place] Phone: [phone] Mobile Phone: [phone] Relation: Daughter  Code Status:  DO NOT RESUSCITATE Goals of care: Advanced Directive information Advanced Directives 08/17/2016  Does patient have an advance directive? No  Would patient like information on creating an advanced directive? No - patient declined information     Chief Complaint  Patient presents with  . Acute Visit    HPI:  Pt is a 70 y.o. female seen today for an acute visit for Malfunctioning percutaneous endoscopic gastrostomy tube and acute pulmonary edema. Nursing was about to attempt to give patient morning meds through the PEG tube when it was noticed the tube was no longer in place. Nursing called me to assess. PEG track site was obviously open with small amount of serous drainage. Patient reported the area is tender to touch. Mild redness around PEG site. No swelling or edema noted. Patient did not want the tube replaced, nor did she want to go to the emergency room to have the site addressed. Nursing called patient's daughter to update on this change. Daughter was upset and was wanting patient sent to the hospital. Nursing and I had discussion with patient's son regarding the patient's wishes. Son is in agreement with leaving the tube out and not going to the hospital as this is his mother's wishes. Son says he will speak with his sister. Patient also was noticed to have "wet" cough during assessment and interview. The congestion cleared briefly with a  cough. Patient reported she was having some difficulty breathing and requested morphine at this time. Vital signs stable   Past Medical History:  Diagnosis Date  . Asthma   . Breast cancer ([place])   . Carpal tunnel syndrome   . COPD (chronic obstructive pulmonary disease) ([place])   . Depression   . Diabetes mellitus type 2, uncomplicated ([place])   . Hyperlipidemia   . Hypertension   . Hypothyroidism   . Osteoporosis, post-menopausal   . Psoriasis   . Sleep apnea   . Vitamin D deficiency    Past Surgical History:  Procedure Laterality Date  . BREAST EXCISIONAL BIOPSY Left 10/06/2015   np for surgery lumpectomy  . BREAST LUMPECTOMY WITH NEEDLE LOCALIZATION Left 10/06/2015   Procedure: BREAST LUMPECTOMY WITH NEEDLE LOCALIZATION;  Surgeon: Hubbard Robinson, MD;  Location: [facility] ORS;  Service: General;  Laterality: Left;  . carpal tunnel Right 1995  . Right heel spur removed    . SENTINEL NODE BIOPSY Left 10/06/2015   Procedure: SENTINEL NODE BIOPSY;  Surgeon: Hubbard Robinson, MD;  Location: [facility] ORS;  Service: General;  Laterality: Left;  . THUMB ARTHROSCOPY Left 2000   release of a nerve to the thumb.  . TRACHEOSTOMY TUBE PLACEMENT N/A 03/14/2016   Procedure: TRACHEOSTOMY;  Surgeon: Carloyn Manner, MD;  Location: [facility] ORS;  Service: ENT;  Laterality: N/A;  . TUBAL LIGATION      Allergies  Allergen Reactions  . Other Hives and Rash    Shellfish. Shellfish Pt reports no allergic reaction to iodine or betadine.  Marland Kitchen  Shellfish Allergy Hives and Rash      Medication List       Accurate as of 08/29/16  7:34 PM. Always use your most recent med list.          albuterol (2.5 MG/3ML) 0.083% nebulizer solution Commonly known as:  PROVENTIL Inhale 1 vial using nebulizer every six hours as needed for shortness of breath and/or wheezing.   aspirin EC 81 MG tablet Take 81 mg by mouth every morning.   BREO ELLIPTA 100-25 MCG/INH Aepb Generic drug:  fluticasone  furoate-vilanterol Inhale 2 puffs into the lungs daily.   buPROPion 150 MG 24 hr tablet Commonly known as:  WELLBUTRIN XL Take 150 mg by mouth every morning.   ciprofloxacin 500 MG tablet Commonly known as:  CIPRO Take 1 tablet (500 mg total) by mouth 2 (two) times daily.   clobetasol 0.05 % external solution Commonly known as:  TEMOVATE Apply 1 application topically daily as needed (for psoriasis.).   clonazePAM 0.25 MG disintegrating tablet Commonly known as:  KLONOPIN Take 1 tablet by mouth every 12 (twelve) hours.   dexamethasone 4 MG tablet Commonly known as:  DECADRON Take 4 mg by mouth daily.   escitalopram 20 MG tablet Commonly known as:  LEXAPRO Take 20 mg by mouth daily.   famotidine 40 MG/5ML suspension Commonly known as:  PEPCID Place 2.5 mLs (20 mg total) into feeding tube daily.   feeding supplement (OSMOLITE 1.5 CAL) Liqd Place 1,000 mLs into feeding tube continuous.   feeding supplement (PRO-STAT SUGAR FREE 64) Liqd Place 30 mLs into feeding tube 3 (three) times daily.   free water Soln Place 200 mLs into feeding tube every 8 (eight) hours.   furosemide 20 MG tablet Commonly known as:  LASIX Take 20 mg by mouth 2 (two) times daily.   gabapentin 100 MG capsule Commonly known as:  NEURONTIN Take 100 mg by mouth at bedtime.   glipiZIDE 5 MG tablet Commonly known as:  GLUCOTROL Take 1 tablet by mouth daily before lunch.   guaiFENesin 600 MG 12 hr tablet Commonly known as:  MUCINEX Take 1 tablet (600 mg total) by mouth 2 (two) times daily.   HUMALOG 100 UNIT/ML injection Generic drug:  insulin lispro Inject 20 Units into the skin 3 (three) times daily with meals.   HUMIRA PEN-PSORIASIS STARTER 40 MG/0.8ML Pnkt Generic drug:  Adalimumab Inject 40 mg into the skin every 14 (fourteen) days.   insulin aspart 100 UNIT/ML injection Commonly known as:  novoLOG Inject 4 Units into the skin every 4 (four) hours.   insulin detemir 100 UNIT/ML  injection Commonly known as:  LEVEMIR Inject 40 Units into the skin at bedtime.   INVOKANA 100 MG Tabs tablet Generic drug:  canagliflozin Take 100 mg by mouth daily.   letrozole 2.5 MG tablet Commonly known as:  FEMARA Take 2.5 mg by mouth daily.   levothyroxine 200 MCG tablet Commonly known as:  SYNTHROID, LEVOTHROID Take 200 mcg by mouth daily before breakfast. *Take along with 50 mcg to equal 250 mcg total dose*   levothyroxine 50 MCG tablet Commonly known as:  SYNTHROID, LEVOTHROID Take 50 mcg by mouth daily before breakfast. *Take along with 200 mcg tablet to equal 250 mcg total dose.*   losartan-hydrochlorothiazide 100-25 MG tablet Commonly known as:  HYZAAR Take 1 tablet by mouth daily. In am.   metoprolol 50 MG tablet Commonly known as:  LOPRESSOR Take 50 mg by mouth 2 (two) times daily.  metroNIDAZOLE 500 MG tablet Commonly known as:  FLAGYL Take 1 tablet (500 mg total) by mouth every 8 (eight) hours.   mirtazapine 15 MG tablet Commonly known as:  REMERON Take 7.5 tablets by mouth daily.   morphine CONCENTRATE 10 MG/0.5ML Soln concentrated solution Take 0.5-1 mLs (10-20 mg total) by mouth every hour as needed for severe pain.   simvastatin 40 MG tablet Commonly known as:  ZOCOR Take 40 mg by mouth daily at 6 PM.   sodium hypochlorite 0.125 % Soln Commonly known as:  DAKIN'S 1/4 STRENGTH Irrigate with as directed daily at 3 pm.   SPIRIVA HANDIHALER 18 MCG inhalation capsule Generic drug:  tiotropium Place 18 mcg into inhaler and inhale daily.       Review of Systems  Constitutional: Positive for fatigue. Negative for activity change, appetite change, chills, diaphoresis and fever.  HENT: Positive for congestion. Negative for sneezing, sore throat, trouble swallowing and voice change.   Respiratory: Positive for cough and shortness of breath. Negative for apnea, choking, chest tightness and wheezing.   Cardiovascular: Negative for chest pain,  palpitations and leg swelling.  Gastrointestinal: Positive for abdominal pain (at PEG site). Negative for abdominal distention, constipation, diarrhea and nausea.  Genitourinary: Negative for difficulty urinating (foley in place), dysuria, frequency and urgency.  Musculoskeletal: Positive for arthralgias (typical arthritis, generalized pain). Negative for back pain, gait problem and myalgias.  Skin: Positive for wound. Negative for color change, pallor and rash.  Neurological: Negative for dizziness, tremors, syncope, speech difficulty, weakness, numbness and headaches.  Psychiatric/Behavioral: Negative for agitation and behavioral problems.  All other systems reviewed and are negative.   Immunization History  Administered Date(s) Administered  . Influenza,inj,Quad PF,36+ Mos 09/07/2015, 08/19/2016   Pertinent  Health Maintenance Due  Topic Date Due  . FOOT EXAM  05/06/1958  . OPHTHALMOLOGY EXAM  05/06/1958  . COLONOSCOPY  05/06/1998  . PNA vac Low Risk Adult (1 of 2 - PCV13) 05/06/2013  . HEMOGLOBIN A1C  02/15/2017  . MAMMOGRAM  08/24/2017  . INFLUENZA VACCINE  Completed  . DEXA SCAN  Completed   Fall Risk  02/21/2016  Falls in the past year? No   Functional Status Survey:    Vitals:   08/27/16 0500  BP: 124/62  Pulse: 89  Resp: 20  Temp: 98.2 F (36.8 C)  SpO2: 99%   There is no height or weight on file to calculate BMI. Physical Exam  Constitutional: She is oriented to person, place, and time. Vital signs are normal. She appears well-developed and well-nourished. She is active and cooperative. She has a sickly appearance. She does not appear ill. No distress. Nasal cannula in place.  HENT:  Head: Normocephalic and atraumatic.  Mouth/Throat: Uvula is midline, oropharynx is clear and moist and mucous membranes are normal. Mucous membranes are not pale, not dry and not cyanotic.  Eyes: Conjunctivae, EOM and lids are normal. Pupils are equal, round, and reactive to light.   Neck: Trachea normal, normal range of motion and full passive range of motion without pain. Neck supple. No JVD present. No tracheal deviation, no edema and no erythema present. No thyromegaly present.  Cardiovascular: Regular rhythm, normal heart sounds, intact distal pulses and normal pulses.  Tachycardia present.  Exam reveals no gallop, no distant heart sounds and no friction rub.   No murmur heard. Pulmonary/Chest: No accessory muscle usage. Tachypnea noted. She is in respiratory distress. She has decreased breath sounds in the right lower field and the left  lower field. She has no wheezes. She has rhonchi (Course) in the right upper field and the left upper field. She has rales in the right upper field, the right middle field, the right lower field, the left upper field, the left middle field and the left lower field. She exhibits no tenderness.  Abdominal: Soft. Normal appearance and bowel sounds are normal. She exhibits no distension and no ascites. There is no tenderness.    Musculoskeletal: Normal range of motion. She exhibits no edema or tenderness.  Expected osteoarthritis, stiffness  Neurological: She is alert and oriented to person, place, and time. She has normal strength.  Skin: Skin is warm, dry and intact. She is not diaphoretic. No cyanosis. No pallor. Nails show no clubbing.  Psychiatric: She has a normal mood and affect. Her speech is normal and behavior is normal. Judgment and thought content normal. Cognition and memory are normal.  Nursing note and vitals reviewed.   Labs reviewed:  Recent Labs  06/10/16 0417  06/12/16 0442 06/12/16 1714  07/07/16 1850  08/17/16 1359 08/17/16 1953 08/18/16 0644 08/19/16 0412  NA 150*  < > 148* 147*  < > 135  < > 139  --  142 145  K 2.8*  < > 2.8* 3.5  < > 3.7  < > 2.7* 2.9* 2.9* 3.8  CL 116*  < > 113* 111  < > 88*  < > 96*  --  104 109  CO2 26  < > 30 29  < > 34*  < > 33*  --  27 31  GLUCOSE 263*  < > 216* 186*  < > 263*  < >  408*  --  343* 218*  BUN 63*  < > 43* 34*  < > 39*  < > 55*  --  37* 25*  CREATININE 0.81  < > 0.38* 0.37*  < > 0.42*  < > 0.71  --  0.53 0.37*  CALCIUM 8.9  < > 9.2 9.1  < > 9.3  < > 9.7  --  9.4 9.5  MG 1.8  --  1.4* 1.8  --  1.7  --   --  1.6*  --   --   PHOS 2.2*  --  1.4* 2.9  --   --   --   --   --   --   --   < > = values in this interval not displayed.  Recent Labs  07/31/16 1445 08/08/16 0811 08/17/16 1359  AST 38 24 31  ALT 83* 30 35  ALKPHOS 114 102 187*  BILITOT 0.4 0.6 0.6  PROT 6.1* 5.7* 5.7*  ALBUMIN 2.8* 2.6* 2.5*    Recent Labs  07/31/16 1445 08/08/16 0811 08/17/16 1359 08/17/16 1953 08/18/16 0644  WBC 11.0 6.3 7.8 8.0 7.2  NEUTROABS 9.4* 4.6 7.0*  --   --   HGB 11.2* 11.3* 11.1* 11.1* 10.3*  HCT 33.5* 33.2* 34.3* 34.7* 30.3*  MCV 96.3 96.5 99.1 99.7 98.4  PLT 238 191 215 208 190   Lab Results  Component Value Date   TSH 0.124 (L) 07/07/2016   Lab Results  Component Value Date   HGBA1C 9.2 (H) 08/17/2016   Lab Results  Component Value Date   CHOL 173 07/10/2016   HDL 68 07/10/2016   LDLCALC 60 07/10/2016   TRIG 226 (H) 07/10/2016   CHOLHDL 2.5 07/10/2016    Significant Diagnostic Results in last 30 days:  Dg Chest 2 View  Result  Date: 08/17/2016 CLINICAL DATA:  Low blood pressure, uncooperative somewhat unresponsive patient, former smoking history EXAM: CHEST  2 VIEW COMPARISON:  Chest x-ray of 06/19/2016 FINDINGS: There is parenchymal opacity within the right mid upper lung most consistent with pneumonia. There is a small cavitary lesion in the left lower lobe with air-fluid level possibly and infected bullus. No pleural effusion is seen. The film is somewhat rotated. The heart is mildly enlarged and stable. IMPRESSION: 1. Patchy opacity in the right mid upper lung most consistent with pneumonia. 2. Cavitary lesion with thin walls in the left lower lobe with air-fluid level most consistent with an infected bullus. CT of the chest would be  helpful if further assessment is warranted . Electronically Signed   By: Ivar Drape M.D.   On: 08/17/2016 14:36   Ct Chest W Contrast  Addendum Date: 08/18/2016   ADDENDUM REPORT: 08/18/2016 10:36 ADDENDUM: Comparison is made with CT of the abdomen and pelvis performed on 06/08/2016: Since the exam of the abdomen, the cavitary lesion at the left lung base has improved significantly, most consistent with resolving infectious or inflammatory process. Non-contrast chest CT at 3-6 months is recommended. Electronically Signed   By: Nolon Nations M.D.   On: 08/18/2016 10:36   Result Date: 08/18/2016 CLINICAL DATA:  Low blood pressure today. Pneumonia. Coughing for 2 weeks. EXAM: CT CHEST WITH CONTRAST TECHNIQUE: Multidetector CT imaging of the chest was performed during intravenous contrast administration. CONTRAST:  49mL ISOVUE-300 IOPAMIDOL (ISOVUE-300) INJECTION 61% COMPARISON:  03/08/2016 FINDINGS: Cardiovascular: Heart size is upper normal. Coronary artery calcifications are present. Thoracic aorta is atherosclerotic. No aneurysm. Bovine arch anatomy noted. The pulmonary arteries are grossly well opacified. Mediastinum/Nodes: No mediastinal, hilar, or axillary adenopathy. Esophagus is normal. Lungs/Pleura: There has been significant improvement in aeration of the lungs since 03/08/2016. Within the left lower lobe there is a small cavitary lesion measuring 2.2 x 3.4 cm. This is a new finding since prior study. Multiple pulmonary infiltrates have resolved. Faint infiltrates persist in the upper lobes. Scattered calcified granulomata are present. A nodule in the right lower lobe is 5 x 6 mm on image 96 of series 3. More posteriorly in the right lower lobe, the density is 10 x 17 mm. A nodule in the lingula is 5 x 4 mm. Upper Abdomen: Patient has a gastrostomy tube. Gallbladder is present. Musculoskeletal: Visualized osseous structures have a normal appearance. IMPRESSION: 1. Interval development of cavitary  lesion in the left lower lobe. 2. Persistent faint patchy densities in the upper lobes bilaterally. Findings favor infectious process. 3. Coronary artery disease. 4. Atherosclerosis of the thoracic aorta. 5. Gastrostomy tube. Electronically Signed: By: Nolon Nations M.D. On: 08/17/2016 16:08    Assessment/Plan 1. Acute pulmonary edema (HCC)  Per discussion, decided against IM Lasix as the edema will quickly return  Scopolamine patch behind the ear, change every 3 days for secretions  2. Malfunction of percutaneous endoscopic gastrostomy (PEG) tube ([place])  Per patient's wishes, gastrostomy tube to remain out  Keep site covered with Vaseline gauze and dry gauze dressing  Change stress and daily and when necessary  Family/ staff Communication:   Total Time:  Documentation:  Face to Face:  Family/Phone: Spoke with son at bedside regarding patient's wishes for greater than 15 minutes.   Labs/tests ordered:    Medication list reviewed and assessed for continued appropriateness.  Vikki Ports, NP-C [facility] [phone], [place] [address]  Cell Phone (Mon-Fri 8am-5pm):  [phone] On Call:  [phone] & follow prompts after 5pm & weekends Office Phone:  [phone] Office Fax:  [phone]

## 2016-09-06 NOTE — Progress Notes (Signed)
Location:      Place of Service:  SNF (31) Provider:  Toni Arthurs, NP-C  Renee Rival, NP  Patient Care Team: Renee Rival, NP as PCP - General (Nurse Practitioner)  Extended Emergency Contact Information Primary Emergency Contact: Ebony [name] Address: [address]          [place], [place] [address] [name] of [place] Phone: [phone] Relation: Spouse Secondary Emergency Contact: Ebony [name] [place] of [place] Phone: [phone] Mobile Phone: [phone] Relation: Daughter  Code Status:  dnr Goals of care: Advanced Directive information Advanced Directives 08/17/2016  Does patient have an advance directive? No  Would patient like information on creating an advanced directive? No - patient declined information     Chief Complaint  Patient presents with  . Wound Check    HPI:  Pt is a 70 y.o. female seen today for medical management of chronic diseases. Sacral wound was re-assessed and wound care orders adjusted. Wound bed is pink and moist. Small amount of necrotic tissue. Mild odor. Wound measurements per wound care RN. New area of breakdown in the distal right gluteal cleft with moderate drainage. Pt's PO intake has improved slightly. On supplements. On air mattress. Turned and repositioned frequently. Pt tolerated procedure well. Pt denies dyspnea at this time. Says her breathing is "about the same." No signs of respiratory distress. VSS. No complaints. Says pain is well controlled.   Past Medical History:  Diagnosis Date  . Asthma   . Breast cancer ([facility] [place])   . Carpal tunnel syndrome   . COPD (chronic obstructive pulmonary disease) ([place])   . Depression   . Diabetes mellitus type 2, uncomplicated ([place])   . Hyperlipidemia   . Hypertension   . Hypothyroidism   . Osteoporosis, post-menopausal   . Psoriasis   . Sleep apnea   . Vitamin D deficiency    Past Surgical History:  Procedure Laterality Date  . BREAST  EXCISIONAL BIOPSY Left 10/06/2015   np for surgery lumpectomy  . BREAST LUMPECTOMY WITH NEEDLE LOCALIZATION Left 10/06/2015   Procedure: BREAST LUMPECTOMY WITH NEEDLE LOCALIZATION;  Surgeon: Hubbard Robinson, MD;  Location: [facility] ORS;  Service: General;  Laterality: Left;  . carpal tunnel Right 1995  . Right heel spur removed    . SENTINEL NODE BIOPSY Left 10/06/2015   Procedure: SENTINEL NODE BIOPSY;  Surgeon: Hubbard Robinson, MD;  Location: [facility] ORS;  Service: General;  Laterality: Left;  . THUMB ARTHROSCOPY Left 2000   release of a nerve to the thumb.  . TRACHEOSTOMY TUBE PLACEMENT N/A 03/14/2016   Procedure: TRACHEOSTOMY;  Surgeon: Carloyn Manner, MD;  Location: [facility] ORS;  Service: ENT;  Laterality: N/A;  . TUBAL LIGATION      Allergies  Allergen Reactions  . Other Hives and Rash    Shellfish. Shellfish Pt reports no allergic reaction to iodine or betadine.  . Shellfish Allergy Hives and Rash      Medication List       Accurate as of 08/23/16 11:59 PM. Always use your most recent med list.          albuterol (2.5 MG/3ML) 0.083% nebulizer solution Commonly known as:  PROVENTIL Inhale 1 vial using nebulizer every six hours as needed for shortness of breath and/or wheezing.   aspirin EC 81 MG tablet Take 81 mg by mouth every morning.   BREO ELLIPTA 100-25 MCG/INH Aepb Generic drug:  fluticasone furoate-vilanterol Inhale 2 puffs into the lungs daily.   buPROPion  150 MG 24 hr tablet Commonly known as:  WELLBUTRIN XL Take 150 mg by mouth every morning.   ciprofloxacin 500 MG tablet Commonly known as:  CIPRO Take 1 tablet (500 mg total) by mouth 2 (two) times daily.   clobetasol 0.05 % external solution Commonly known as:  TEMOVATE Apply 1 application topically daily as needed (for psoriasis.).   clonazePAM 0.25 MG disintegrating tablet Commonly known as:  KLONOPIN Take 1 tablet by mouth every 12 (twelve) hours.   dexamethasone 4 MG tablet Commonly known  as:  DECADRON Take 4 mg by mouth daily.   escitalopram 20 MG tablet Commonly known as:  LEXAPRO Take 20 mg by mouth daily.   famotidine 40 MG/5ML suspension Commonly known as:  PEPCID Place 2.5 mLs (20 mg total) into feeding tube daily.   feeding supplement (OSMOLITE 1.5 CAL) Liqd Place 1,000 mLs into feeding tube continuous.   feeding supplement (PRO-STAT SUGAR FREE 64) Liqd Place 30 mLs into feeding tube 3 (three) times daily.   free water Soln Place 200 mLs into feeding tube every 8 (eight) hours.   furosemide 20 MG tablet Commonly known as:  LASIX Take 20 mg by mouth 2 (two) times daily.   gabapentin 100 MG capsule Commonly known as:  NEURONTIN Take 100 mg by mouth at bedtime.   glipiZIDE 5 MG tablet Commonly known as:  GLUCOTROL Take 1 tablet by mouth daily before lunch.   guaiFENesin 600 MG 12 hr tablet Commonly known as:  MUCINEX Take 1 tablet (600 mg total) by mouth 2 (two) times daily.   HUMALOG 100 UNIT/ML injection Generic drug:  insulin lispro Inject 20 Units into the skin 3 (three) times daily with meals.   HUMIRA PEN-PSORIASIS STARTER 40 MG/0.8ML Pnkt Generic drug:  Adalimumab Inject 40 mg into the skin every 14 (fourteen) days.   insulin aspart 100 UNIT/ML injection Commonly known as:  novoLOG Inject 4 Units into the skin every 4 (four) hours.   insulin detemir 100 UNIT/ML injection Commonly known as:  LEVEMIR Inject 40 Units into the skin at bedtime.   INVOKANA 100 MG Tabs tablet Generic drug:  canagliflozin Take 100 mg by mouth daily.   letrozole 2.5 MG tablet Commonly known as:  FEMARA Take 2.5 mg by mouth daily.   levothyroxine 200 MCG tablet Commonly known as:  SYNTHROID, LEVOTHROID Take 200 mcg by mouth daily before breakfast. *Take along with 50 mcg to equal 250 mcg total dose*   levothyroxine 50 MCG tablet Commonly known as:  SYNTHROID, LEVOTHROID Take 50 mcg by mouth daily before breakfast. *Take along with 200 mcg tablet to  equal 250 mcg total dose.*   losartan-hydrochlorothiazide 100-25 MG tablet Commonly known as:  HYZAAR Take 1 tablet by mouth daily. In am.   metoprolol 50 MG tablet Commonly known as:  LOPRESSOR Take 50 mg by mouth 2 (two) times daily.   metroNIDAZOLE 500 MG tablet Commonly known as:  FLAGYL Take 1 tablet (500 mg total) by mouth every 8 (eight) hours.   mirtazapine 15 MG tablet Commonly known as:  REMERON Take 7.5 tablets by mouth daily.   morphine CONCENTRATE 10 MG/0.5ML Soln concentrated solution Take 0.5-1 mLs (10-20 mg total) by mouth every hour as needed for severe pain.   simvastatin 40 MG tablet Commonly known as:  ZOCOR Take 40 mg by mouth daily at 6 PM.   sodium hypochlorite 0.125 % Soln Commonly known as:  DAKIN'S 1/4 STRENGTH Irrigate with as directed daily at 3 pm.  SPIRIVA HANDIHALER 18 MCG inhalation capsule Generic drug:  tiotropium Place 18 mcg into inhaler and inhale daily.       Review of Systems  Constitutional: Positive for activity change and appetite change. Negative for chills, diaphoresis and fever.  HENT: Negative for congestion, sneezing, sore throat, trouble swallowing and voice change.   Eyes: Negative for pain, redness and visual disturbance.  Respiratory: Negative for apnea, cough, choking, chest tightness, shortness of breath and wheezing.   Cardiovascular: Negative for chest pain, palpitations and leg swelling.  Gastrointestinal: Negative for abdominal distention, abdominal pain, constipation, diarrhea, nausea and vomiting.  Genitourinary: Negative for difficulty urinating, dysuria, frequency and urgency.  Musculoskeletal: Negative for back pain, gait problem and myalgias. Arthralgias: typical arthritis.  Skin: Positive for pallor and wound. Negative for color change and rash.  Neurological: Negative for dizziness, tremors, syncope, speech difficulty, weakness, numbness and headaches.  Psychiatric/Behavioral: Negative for agitation and  behavioral problems.  All other systems reviewed and are negative.   Immunization History  Administered Date(s) Administered  . Influenza,inj,Quad PF,36+ Mos 09/07/2015, 08/19/2016   Pertinent  Health Maintenance Due  Topic Date Due  . FOOT EXAM  05/06/1958  . OPHTHALMOLOGY EXAM  05/06/1958  . COLONOSCOPY  05/06/1998  . PNA vac Low Risk Adult (1 of 2 - PCV13) 05/06/2013  . HEMOGLOBIN A1C  02/15/2017  . MAMMOGRAM  08/24/2017  . INFLUENZA VACCINE  Completed  . DEXA SCAN  Completed   Fall Risk  02/21/2016  Falls in the past year? No   Functional Status Survey:    Vitals:   08/23/16 1152  BP: 126/68  Resp: (!) 22  Temp: 98.6 F (37 C)  SpO2: 97%   There is no height or weight on file to calculate BMI. Physical Exam  Constitutional: She is oriented to person, place, and time. Vital signs are normal. She appears well-developed and well-nourished. She is active and cooperative. She does not appear ill. No distress.  HENT:  Head: Normocephalic and atraumatic.  Mouth/Throat: Uvula is midline, oropharynx is clear and moist and mucous membranes are normal. Mucous membranes are not pale, not dry and not cyanotic.  Eyes: Conjunctivae, EOM and lids are normal. Pupils are equal, round, and reactive to light.  Neck: Trachea normal, normal range of motion and full passive range of motion without pain. Neck supple. No JVD present. No tracheal deviation, no edema and no erythema present. No thyromegaly present.  Cardiovascular: Regular rhythm, normal heart sounds, intact distal pulses and normal pulses.  Tachycardia present.  Exam reveals no gallop, no distant heart sounds and no friction rub.   No murmur heard. Pulmonary/Chest: Effort normal. No accessory muscle usage. No tachypnea. No respiratory distress. She has no decreased breath sounds. She has no wheezes. She has no rhonchi. She has no rales. She exhibits no tenderness.  Abdominal: Soft. Normal appearance and bowel sounds are normal.  She exhibits no distension, no pulsatile liver, no fluid wave, no ascites and no pulsatile midline mass. There is no tenderness.  PEG tube in place  Musculoskeletal: Normal range of motion. She exhibits no edema or tenderness.  Expected osteoarthritis, stiffness  Neurological: She is alert and oriented to person, place, and time. She has normal strength.  Skin: Skin is warm, dry and intact. Lesion (stage III pressure wound to sacrum) noted. No rash noted. She is not diaphoretic. No cyanosis or erythema. No pallor. Nails show no clubbing.     Decubitus ulcer to ischial.  Psychiatric: She has a  normal mood and affect. Her speech is normal and behavior is normal. Judgment and thought content normal. Cognition and memory are normal.  Nursing note and vitals reviewed.   Labs reviewed:  Recent Labs  06/10/16 0417  06/12/16 0442 06/12/16 1714  07/07/16 1850  08/17/16 1359 08/17/16 1953 08/18/16 0644 08/19/16 0412  NA 150*  < > 148* 147*  < > 135  < > 139  --  142 145  K 2.8*  < > 2.8* 3.5  < > 3.7  < > 2.7* 2.9* 2.9* 3.8  CL 116*  < > 113* 111  < > 88*  < > 96*  --  104 109  CO2 26  < > 30 29  < > 34*  < > 33*  --  27 31  GLUCOSE 263*  < > 216* 186*  < > 263*  < > 408*  --  343* 218*  BUN 63*  < > 43* 34*  < > 39*  < > 55*  --  37* 25*  CREATININE 0.81  < > 0.38* 0.37*  < > 0.42*  < > 0.71  --  0.53 0.37*  CALCIUM 8.9  < > 9.2 9.1  < > 9.3  < > 9.7  --  9.4 9.5  MG 1.8  --  1.4* 1.8  --  1.7  --   --  1.6*  --   --   PHOS 2.2*  --  1.4* 2.9  --   --   --   --   --   --   --   < > = values in this interval not displayed.  Recent Labs  07/31/16 1445 08/08/16 0811 08/17/16 1359  AST 38 24 31  ALT 83* 30 35  ALKPHOS 114 102 187*  BILITOT 0.4 0.6 0.6  PROT 6.1* 5.7* 5.7*  ALBUMIN 2.8* 2.6* 2.5*    Recent Labs  07/31/16 1445 08/08/16 0811 08/17/16 1359 08/17/16 1953 08/18/16 0644  WBC 11.0 6.3 7.8 8.0 7.2  NEUTROABS 9.4* 4.6 7.0*  --   --   HGB 11.2* 11.3* 11.1* 11.1*  10.3*  HCT 33.5* 33.2* 34.3* 34.7* 30.3*  MCV 96.3 96.5 99.1 99.7 98.4  PLT 238 191 215 208 190   Lab Results  Component Value Date   TSH 0.124 (L) 07/07/2016   Lab Results  Component Value Date   HGBA1C 9.2 (H) 08/17/2016   Lab Results  Component Value Date   CHOL 173 07/10/2016   HDL 68 07/10/2016   LDLCALC 60 07/10/2016   TRIG 226 (H) 07/10/2016   CHOLHDL 2.5 07/10/2016    Significant Diagnostic Results in last 30 days:  Dg Chest 2 View  Result Date: 08/17/2016 CLINICAL DATA:  Low blood pressure, uncooperative somewhat unresponsive patient, former smoking history EXAM: CHEST  2 VIEW COMPARISON:  Chest x-ray of 06/19/2016 FINDINGS: There is parenchymal opacity within the right mid upper lung most consistent with pneumonia. There is a small cavitary lesion in the left lower lobe with air-fluid level possibly and infected bullus. No pleural effusion is seen. The film is somewhat rotated. The heart is mildly enlarged and stable. IMPRESSION: 1. Patchy opacity in the right mid upper lung most consistent with pneumonia. 2. Cavitary lesion with thin walls in the left lower lobe with air-fluid level most consistent with an infected bullus. CT of the chest would be helpful if further assessment is warranted . Electronically Signed   By: Windy Canny.D.  On: 08/17/2016 14:36   Ct Chest W Contrast  Addendum Date: 08/18/2016   ADDENDUM REPORT: 08/18/2016 10:36 ADDENDUM: Comparison is made with CT of the abdomen and pelvis performed on 06/08/2016: Since the exam of the abdomen, the cavitary lesion at the left lung base has improved significantly, most consistent with resolving infectious or inflammatory process. Non-contrast chest CT at 3-6 months is recommended. Electronically Signed   By: Nolon Nations M.D.   On: 08/18/2016 10:36   Result Date: 08/18/2016 CLINICAL DATA:  Low blood pressure today. Pneumonia. Coughing for 2 weeks. EXAM: CT CHEST WITH CONTRAST TECHNIQUE: Multidetector CT  imaging of the chest was performed during intravenous contrast administration. CONTRAST:  43mL ISOVUE-300 IOPAMIDOL (ISOVUE-300) INJECTION 61% COMPARISON:  03/08/2016 FINDINGS: Cardiovascular: Heart size is upper normal. Coronary artery calcifications are present. Thoracic aorta is atherosclerotic. No aneurysm. Bovine arch anatomy noted. The pulmonary arteries are grossly well opacified. Mediastinum/Nodes: No mediastinal, hilar, or axillary adenopathy. Esophagus is normal. Lungs/Pleura: There has been significant improvement in aeration of the lungs since 03/08/2016. Within the left lower lobe there is a small cavitary lesion measuring 2.2 x 3.4 cm. This is a new finding since prior study. Multiple pulmonary infiltrates have resolved. Faint infiltrates persist in the upper lobes. Scattered calcified granulomata are present. A nodule in the right lower lobe is 5 x 6 mm on image 96 of series 3. More posteriorly in the right lower lobe, the density is 10 x 17 mm. A nodule in the lingula is 5 x 4 mm. Upper Abdomen: Patient has a gastrostomy tube. Gallbladder is present. Musculoskeletal: Visualized osseous structures have a normal appearance. IMPRESSION: 1. Interval development of cavitary lesion in the left lower lobe. 2. Persistent faint patchy densities in the upper lobes bilaterally. Findings favor infectious process. 3. Coronary artery disease. 4. Atherosclerosis of the thoracic aorta. 5. Gastrostomy tube. Electronically Signed: By: Nolon Nations M.D. On: 08/17/2016 16:08    Assessment/Plan 1. Decubitus ulcer of sacral region, stage 3 (HCC)  Santyl (collagenase clostridium histo.) ointment; 250 unit/gram; amt: 1/4 inch; topical   Special Instructions: Cleanse pressure injury with NS, apply 1/4 inch or (nickel thick) ointment to wound daily, apply ns soaked gauze and cover with allevyn dressing, for sacral wound debridement  Once A Day  2. Mild chronic obstructive pulmonary  disease  Stable  Continue Breo Ellipta and Spiriva daily   Oxygen for comfort  Family/ staff Communication:   Total Time:  Documentation:  Face to Face:  Family/Phone:   Labs/tests ordered:    Medication list reviewed and assessed for continued appropriateness. Monthly medication orders reviewed and signed.  Vikki Ports, NP-C Geriatrics [facility] [phone], [place] [address] Cell Phone (Mon-Fri 8am-5pm):  [phone] On Call:  [phone] & follow prompts after 5pm & weekends Office Phone:  [phone] Office Fax:  [phone]

## 2016-09-10 DIAGNOSIS — E119 Type 2 diabetes mellitus without complications: Secondary | ICD-10-CM | POA: Diagnosis not present

## 2016-09-10 LAB — GLUCOSE, CAPILLARY
GLUCOSE-CAPILLARY: 156 mg/dL — AB (ref 65–99)
GLUCOSE-CAPILLARY: 177 mg/dL — AB (ref 65–99)
GLUCOSE-CAPILLARY: 285 mg/dL — AB (ref 65–99)
Glucose-Capillary: 241 mg/dL — ABNORMAL HIGH (ref 65–99)

## 2016-09-11 DIAGNOSIS — E119 Type 2 diabetes mellitus without complications: Secondary | ICD-10-CM | POA: Diagnosis not present

## 2016-09-11 LAB — GLUCOSE, CAPILLARY
GLUCOSE-CAPILLARY: 104 mg/dL — AB (ref 65–99)
GLUCOSE-CAPILLARY: 187 mg/dL — AB (ref 65–99)
GLUCOSE-CAPILLARY: 255 mg/dL — AB (ref 65–99)
GLUCOSE-CAPILLARY: 325 mg/dL — AB (ref 65–99)
GLUCOSE-CAPILLARY: 218 mg/dL — AB (ref 65–99)
Glucose-Capillary: 118 mg/dL — ABNORMAL HIGH (ref 65–99)

## 2016-09-12 ENCOUNTER — Non-Acute Institutional Stay (SKILLED_NURSING_FACILITY): Payer: Medicare Other | Admitting: Gerontology

## 2016-09-12 DIAGNOSIS — Z515 Encounter for palliative care: Secondary | ICD-10-CM | POA: Diagnosis not present

## 2016-09-12 DIAGNOSIS — E119 Type 2 diabetes mellitus without complications: Secondary | ICD-10-CM | POA: Diagnosis not present

## 2016-09-12 LAB — GLUCOSE, CAPILLARY
GLUCOSE-CAPILLARY: 140 mg/dL — AB (ref 65–99)
GLUCOSE-CAPILLARY: 223 mg/dL — AB (ref 65–99)
GLUCOSE-CAPILLARY: 270 mg/dL — AB (ref 65–99)
Glucose-Capillary: 124 mg/dL — ABNORMAL HIGH (ref 65–99)

## 2016-09-13 ENCOUNTER — Encounter
Admission: RE | Admit: 2016-09-13 | Discharge: 2016-09-13 | Disposition: A | Discharge status: Home / Self Care | Payer: Medicare Other | Source: Ambulatory Visit | Attending: Internal Medicine | Admitting: Internal Medicine

## 2016-09-25 NOTE — Progress Notes (Signed)
Location:      Place of Service:  SNF (31) Provider:  Toni Arthurs, NP-C  Renee Rival, NP  Patient Care Team: Renee Rival, NP as PCP - General (Nurse Practitioner)  Extended Emergency Contact Information Primary Emergency Contact: [name] Address: [address]          [place], [place] [address] [name] of [place] Phone: [phone] Relation: Spouse Secondary Emergency Contact: [name] [place] of [place] Phone: [phone] Mobile Phone: [phone] Relation: Daughter  Code Status:  dnr Goals of care: Advanced Directive information Advanced Directives 08/17/2016  Does patient have an advance directive? No  Would patient like information on creating an advanced directive? No - patient declined information     Chief Complaint  Patient presents with  . Follow-up    HPI:  Pt is a 70 y.o. female seen today for an acute visit for dying care. Pt is lying in bed quietly. She is tachypneic, tachycardic. Legs are mottled up to the knees. Skin is warm/ feverish. Fingers are cool and cyanotic. She flutters her eyes to her name being called. Otherwise, non-responsive, non-verbal. Pt has not eaten or drank in several days. Son at bedside. Hospice RN notified of changes.    Past Medical History:  Diagnosis Date  . Asthma   . Breast cancer ([place])   . Carpal tunnel syndrome   . COPD (chronic obstructive pulmonary disease) ([place])   . Depression   . Diabetes mellitus type 2, uncomplicated ([place])   . Hyperlipidemia   . Hypertension   . Hypothyroidism   . Osteoporosis, post-menopausal   . Psoriasis   . Sleep apnea   . Vitamin D deficiency    Past Surgical History:  Procedure Laterality Date  . BREAST EXCISIONAL BIOPSY Left 10/06/2015   np for surgery lumpectomy  . BREAST LUMPECTOMY WITH NEEDLE LOCALIZATION Left 10/06/2015   Procedure: BREAST LUMPECTOMY WITH NEEDLE LOCALIZATION;  Surgeon: Hubbard Robinson, MD;  Location:  [facility] ORS;  Service: General;  Laterality: Left;  . carpal tunnel Right 1995  . Right heel spur removed    . SENTINEL NODE BIOPSY Left 10/06/2015   Procedure: SENTINEL NODE BIOPSY;  Surgeon: Hubbard Robinson, MD;  Location: [facility] ORS;  Service: General;  Laterality: Left;  . THUMB ARTHROSCOPY Left 2000   release of a nerve to the thumb.  . TRACHEOSTOMY TUBE PLACEMENT N/A 03/14/2016   Procedure: TRACHEOSTOMY;  Surgeon: Carloyn Manner, MD;  Location: [facility] ORS;  Service: ENT;  Laterality: N/A;  . TUBAL LIGATION      Allergies  Allergen Reactions  . Other Hives and Rash    Shellfish. Shellfish Pt reports no allergic reaction to iodine or betadine.  . Shellfish Allergy Hives and Rash      Medication List       Accurate as of 09/12/16 11:59 PM. Always use your most recent med list.          albuterol (2.5 MG/3ML) 0.083% nebulizer solution Commonly known as:  PROVENTIL Inhale 1 vial using nebulizer every six hours as needed for shortness of breath and/or wheezing.   aspirin EC 81 MG tablet Take 81 mg by mouth every morning.   BREO ELLIPTA 100-25 MCG/INH Aepb Generic drug:  fluticasone furoate-vilanterol Inhale 2 puffs into the lungs daily.   buPROPion 150 MG 24 hr tablet Commonly known as:  WELLBUTRIN XL Take 150 mg by mouth every morning.   ciprofloxacin 500 MG tablet Commonly known as:  CIPRO Take  1 tablet (500 mg total) by mouth 2 (two) times daily.   clobetasol 0.05 % external solution Commonly known as:  TEMOVATE Apply 1 application topically daily as needed (for psoriasis.).   clonazePAM 0.25 MG disintegrating tablet Commonly known as:  KLONOPIN Take 1 tablet by mouth every 12 (twelve) hours.   dexamethasone 4 MG tablet Commonly known as:  DECADRON Take 4 mg by mouth daily.   escitalopram 20 MG tablet Commonly known as:  LEXAPRO Take 20 mg by mouth daily.   famotidine 40 MG/5ML suspension Commonly known as:  PEPCID Place 2.5 mLs (20 mg total) into  feeding tube daily.   feeding supplement (OSMOLITE 1.5 CAL) Liqd Place 1,000 mLs into feeding tube continuous.   feeding supplement (PRO-STAT SUGAR FREE 64) Liqd Place 30 mLs into feeding tube 3 (three) times daily.   free water Soln Place 200 mLs into feeding tube every 8 (eight) hours.   furosemide 20 MG tablet Commonly known as:  LASIX Take 20 mg by mouth 2 (two) times daily.   gabapentin 100 MG capsule Commonly known as:  NEURONTIN Take 100 mg by mouth at bedtime.   glipiZIDE 5 MG tablet Commonly known as:  GLUCOTROL Take 1 tablet by mouth daily before lunch.   guaiFENesin 600 MG 12 hr tablet Commonly known as:  MUCINEX Take 1 tablet (600 mg total) by mouth 2 (two) times daily.   HUMALOG 100 UNIT/ML injection Generic drug:  insulin lispro Inject 20 Units into the skin 3 (three) times daily with meals.   HUMIRA PEN-PSORIASIS STARTER 40 MG/0.8ML Pnkt Generic drug:  Adalimumab Inject 40 mg into the skin every 14 (fourteen) days.   insulin aspart 100 UNIT/ML injection Commonly known as:  novoLOG Inject 4 Units into the skin every 4 (four) hours.   insulin detemir 100 UNIT/ML injection Commonly known as:  LEVEMIR Inject 40 Units into the skin at bedtime.   INVOKANA 100 MG Tabs tablet Generic drug:  canagliflozin Take 100 mg by mouth daily.   letrozole 2.5 MG tablet Commonly known as:  FEMARA Take 2.5 mg by mouth daily.   levothyroxine 200 MCG tablet Commonly known as:  SYNTHROID, LEVOTHROID Take 200 mcg by mouth daily before breakfast. *Take along with 50 mcg to equal 250 mcg total dose*   levothyroxine 50 MCG tablet Commonly known as:  SYNTHROID, LEVOTHROID Take 50 mcg by mouth daily before breakfast. *Take along with 200 mcg tablet to equal 250 mcg total dose.*   losartan-hydrochlorothiazide 100-25 MG tablet Commonly known as:  HYZAAR Take 1 tablet by mouth daily. In am.   metoprolol 50 MG tablet Commonly known as:  LOPRESSOR Take 50 mg by mouth 2  (two) times daily.   metroNIDAZOLE 500 MG tablet Commonly known as:  FLAGYL Take 1 tablet (500 mg total) by mouth every 8 (eight) hours.   mirtazapine 15 MG tablet Commonly known as:  REMERON Take 7.5 tablets by mouth daily.   morphine CONCENTRATE 10 MG/0.5ML Soln concentrated solution Take 0.5-1 mLs (10-20 mg total) by mouth every hour as needed for severe pain.   simvastatin 40 MG tablet Commonly known as:  ZOCOR Take 40 mg by mouth daily at 6 PM.   sodium hypochlorite 0.125 % Soln Commonly known as:  DAKIN'S 1/4 STRENGTH Irrigate with as directed daily at 3 pm.   SPIRIVA HANDIHALER 18 MCG inhalation capsule Generic drug:  tiotropium Place 18 mcg into inhaler and inhale daily.       Review of Systems  Unable to perform ROS: Patient unresponsive  Constitutional: Positive for activity change, appetite change, fatigue and fever. Negative for chills and diaphoresis.  HENT: Negative.   Respiratory: Positive for shortness of breath. Negative for apnea, cough, choking, chest tightness and wheezing.   Cardiovascular: Negative for chest pain, palpitations and leg swelling.  Gastrointestinal: Positive for abdominal pain (at PEG site).  Genitourinary: Difficulty urinating: foley in place.  Musculoskeletal: Negative for arthralgias (typical arthritis, generalized pain).  Skin: Positive for color change, pallor and wound. Negative for rash.  Psychiatric/Behavioral: Negative for agitation and behavioral problems.  All other systems reviewed and are negative.   Immunization History  Administered Date(s) Administered  . Influenza,inj,Quad PF,36+ Mos 09/07/2015, 08/19/2016   Pertinent  Health Maintenance Due  Topic Date Due  . FOOT EXAM  05/06/1958  . OPHTHALMOLOGY EXAM  05/06/1958  . COLONOSCOPY  05/06/1998  . PNA vac Low Risk Adult (1 of 2 - PCV13) 05/06/2013  . HEMOGLOBIN A1C  02/15/2017  . MAMMOGRAM  08/24/2017  . INFLUENZA VACCINE  Completed  . DEXA SCAN  Completed    Fall Risk  02/21/2016  Falls in the past year? No   Functional Status Survey:    Vitals:   09/12/16 2045  BP: (!) 138/37  Pulse: (!) 164  Resp: (!) 32  Temp: (!) 100.5 F (38.1 C)  SpO2: 96%   There is no height or weight on file to calculate BMI. Physical Exam  Constitutional: She is oriented to person, place, and time. She appears well-developed and well-nourished. She is sleeping. She has a sickly appearance. She does not appear ill. No distress. Nasal cannula in place.  HENT:  Head: Normocephalic and atraumatic.  Mouth/Throat: Uvula is midline, oropharynx is clear and moist and mucous membranes are normal. Mucous membranes are not pale, not dry and not cyanotic.  Eyes: Conjunctivae, EOM and lids are normal. Pupils are equal, round, and reactive to light.  Neck: Trachea normal, normal range of motion and full passive range of motion without pain. Neck supple. No JVD present. No tracheal deviation, no edema and no erythema present. No thyromegaly present.  Cardiovascular: Intact distal pulses and normal pulses.  An irregular rhythm present. Tachycardia present.  Exam reveals distant heart sounds. Exam reveals no gallop and no friction rub.   No murmur heard. Pulmonary/Chest: No accessory muscle usage. Tachypnea noted. She is in respiratory distress. She has decreased breath sounds in the right lower field and the left lower field. She has no wheezes. She has rhonchi (Course) in the right upper field and the left upper field. She has rales in the right upper field, the right middle field, the right lower field, the left upper field, the left middle field and the left lower field. She exhibits no tenderness.  Abdominal: Soft. Normal appearance and bowel sounds are normal. She exhibits no distension and no ascites. There is no tenderness.    Musculoskeletal: Normal range of motion. She exhibits no edema or tenderness.  Expected osteoarthritis, stiffness  Neurological: She is oriented  to person, place, and time. She has normal strength. She is unresponsive.  Skin: Skin is warm. She is diaphoretic. There is cyanosis (fingers). There is pallor. Nails show no clubbing.  Mottling BLE up to knees. Feet cool  Psychiatric: She has a normal mood and affect. Her speech is normal and behavior is normal. Judgment and thought content normal. Cognition and memory are normal.  Nursing note and vitals reviewed.   Labs reviewed:  Recent Labs  06/10/16 0417  06/12/16 0442 06/12/16 1714  07/07/16 1850  08/17/16 1359 08/17/16 1953 08/18/16 0644 08/19/16 0412  NA 150*  < > 148* 147*  < > 135  < > 139  --  142 145  K 2.8*  < > 2.8* 3.5  < > 3.7  < > 2.7* 2.9* 2.9* 3.8  CL 116*  < > 113* 111  < > 88*  < > 96*  --  104 109  CO2 26  < > 30 29  < > 34*  < > 33*  --  27 31  GLUCOSE 263*  < > 216* 186*  < > 263*  < > 408*  --  343* 218*  BUN 63*  < > 43* 34*  < > 39*  < > 55*  --  37* 25*  CREATININE 0.81  < > 0.38* 0.37*  < > 0.42*  < > 0.71  --  0.53 0.37*  CALCIUM 8.9  < > 9.2 9.1  < > 9.3  < > 9.7  --  9.4 9.5  MG 1.8  --  1.4* 1.8  --  1.7  --   --  1.6*  --   --   PHOS 2.2*  --  1.4* 2.9  --   --   --   --   --   --   --   < > = values in this interval not displayed.  Recent Labs  07/31/16 1445 08/08/16 0811 08/17/16 1359  AST 38 24 31  ALT 83* 30 35  ALKPHOS 114 102 187*  BILITOT 0.4 0.6 0.6  PROT 6.1* 5.7* 5.7*  ALBUMIN 2.8* 2.6* 2.5*    Recent Labs  07/31/16 1445 08/08/16 0811 08/17/16 1359 08/17/16 1953 08/18/16 0644  WBC 11.0 6.3 7.8 8.0 7.2  NEUTROABS 9.4* 4.6 7.0*  --   --   HGB 11.2* 11.3* 11.1* 11.1* 10.3*  HCT 33.5* 33.2* 34.3* 34.7* 30.3*  MCV 96.3 96.5 99.1 99.7 98.4  PLT 238 191 215 208 190   Lab Results  Component Value Date   TSH 0.124 (L) 07/07/2016   Lab Results  Component Value Date   HGBA1C 9.2 (H) 08/17/2016   Lab Results  Component Value Date   CHOL 173 07/10/2016   HDL 68 07/10/2016   LDLCALC 60 07/10/2016   TRIG 226 (H)  07/10/2016   CHOLHDL 2.5 07/10/2016    Significant Diagnostic Results in last 30 days:  No results found.  Assessment/Plan 1. Encounter for dying care  Emotional and spiritual support for family  DNR  Continue Oxyfast prn for pain, dyspnea  Continue alprazolam prn for anxiety, agitation  Continue oxygen- titrate for comfort  Continue foley for EOL care/ comfort    Family/ staff Communication:   Total Time:  Documentation:  Face to Face:  Family/Phone:    Labs/tests ordered:    Medication list reviewed and assessed for continued appropriateness.  Vikki Ports, NP-C Geriatrics [facility] [phone], [place] [address] Cell Phone (Mon-Fri 8am-5pm):  [phone] On Call:  [phone] & follow prompts after 5pm & weekends Office Phone:  [phone] Office Fax:  [phone]

## 2016-10-13 DEATH — deceased

## 2017-05-05 IMAGING — CT CT ANGIO CHEST
1 of 2 series · 18 of 30 positions shown · IV contrast (APPLIED)
Comparison: 11/19/2014

CLINICAL DATA: Chest pain and shortness of Breath, history of
recent mastectomy

EXAM:
CT ANGIOGRAPHY CHEST WITH CONTRAST
TECHNIQUE: Multidetector CT imaging of the chest was performed using the
standard protocol during bolus administration of intravenous
contrast. Multiplanar CT image reconstructions and MIPs were
obtained to evaluate the vascular anatomy.
CONTRAST:  100mL OMNIPAQUE IOHEXOL 350 MG/ML SOLN

[Series 6: pe 1.0 thins · axial · 0.68mm/px · z∈[-1138,-856]mm · 18 of 318 slices shown]
[im 18/318  lung]
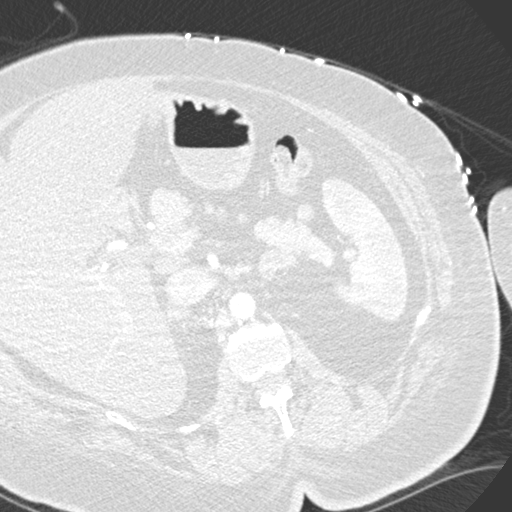
[im 36/318  mediastinal]
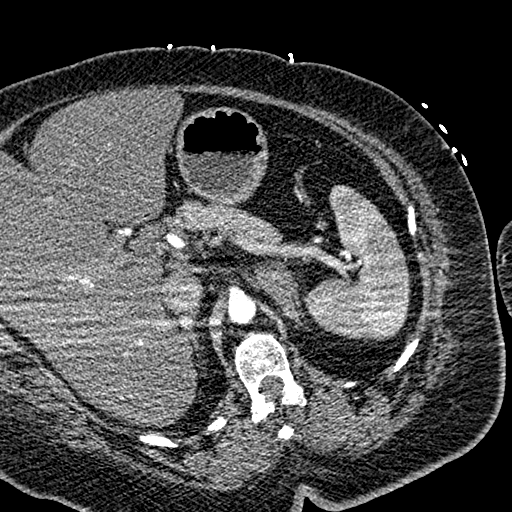
[im 53/318  lung]
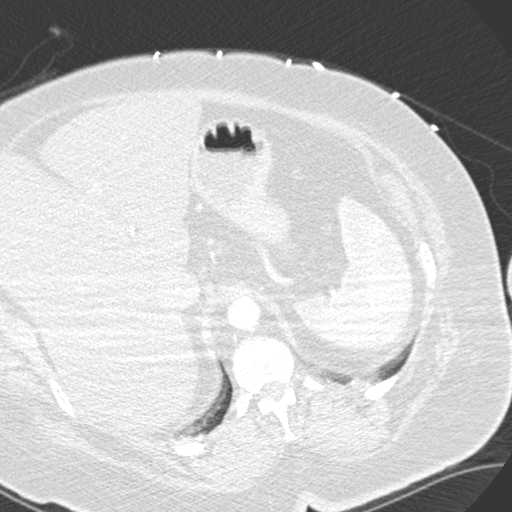
[im 71/318  mediastinal]
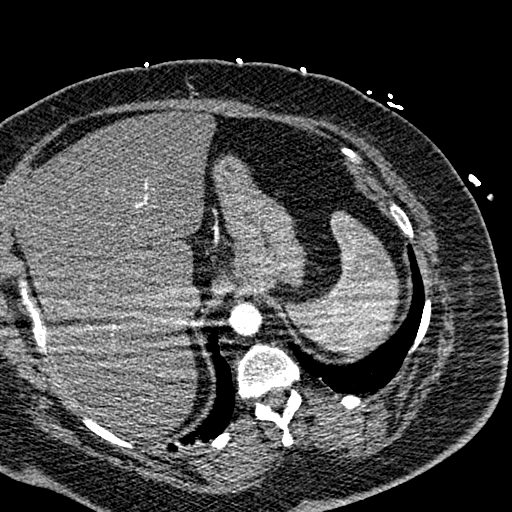
[im 89/318  lung]
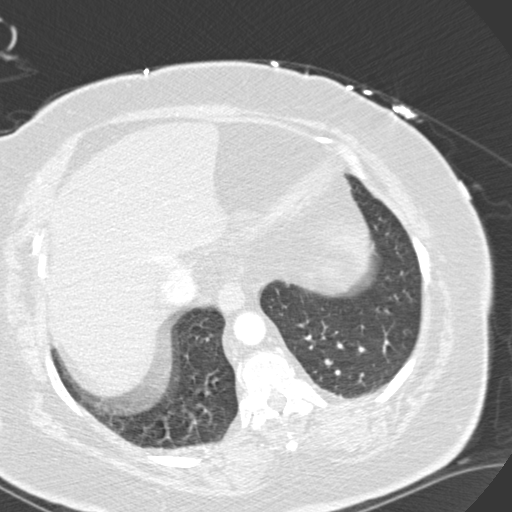
[im 106/318  mediastinal]
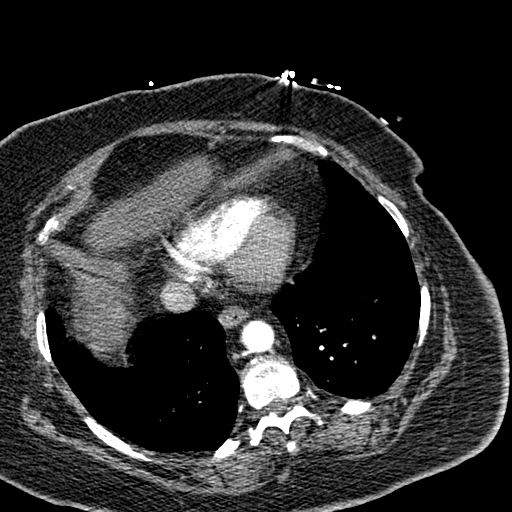
[im 124/318  lung]
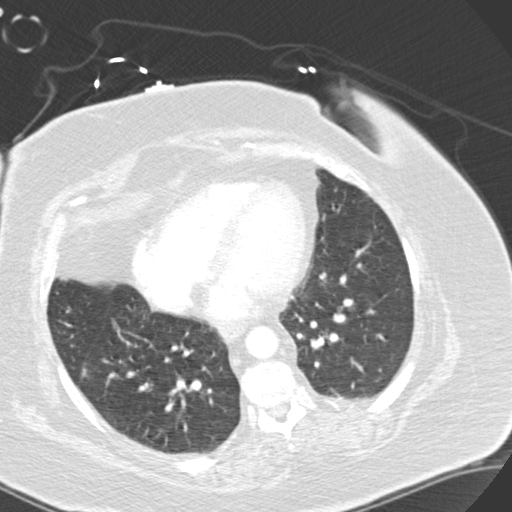
[im 141/318  mediastinal]
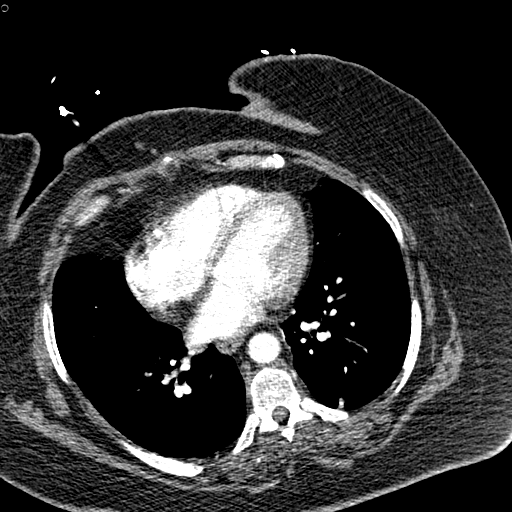
[im 150/318  lung]
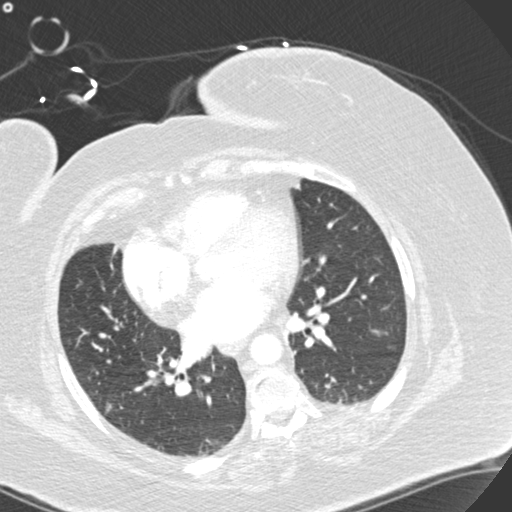
[im 159/318  mediastinal]
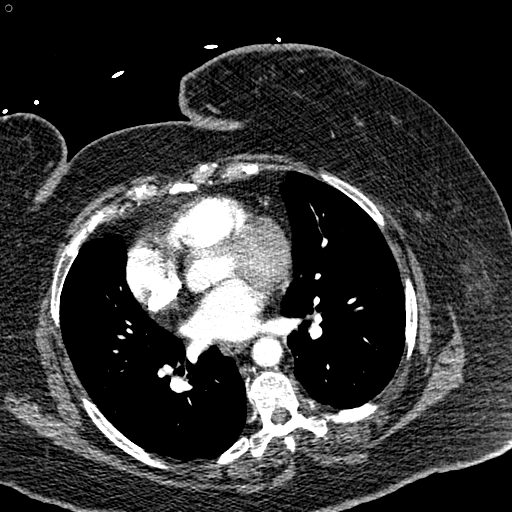
[im 177/318  lung]
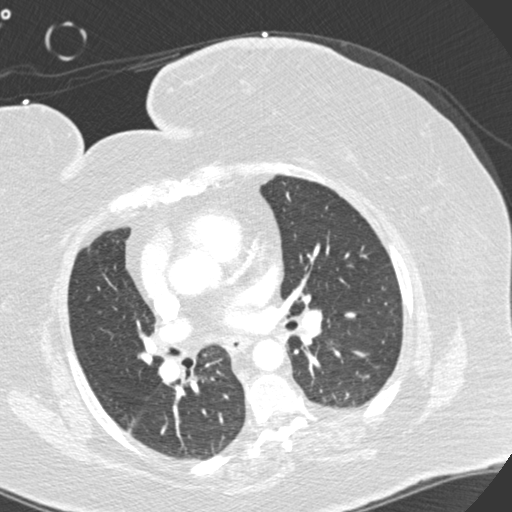
[im 194/318  mediastinal]
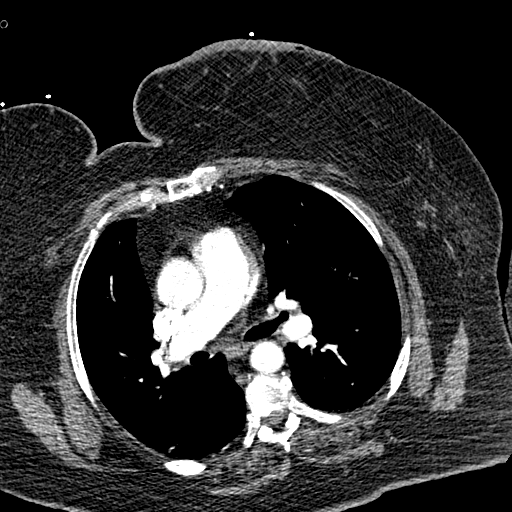
[im 212/318  lung]
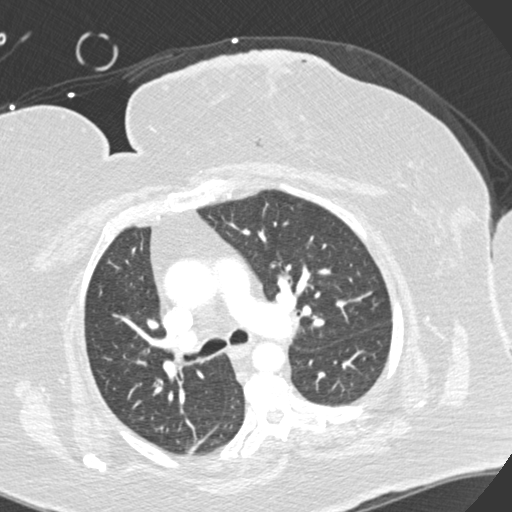
[im 229/318  mediastinal]
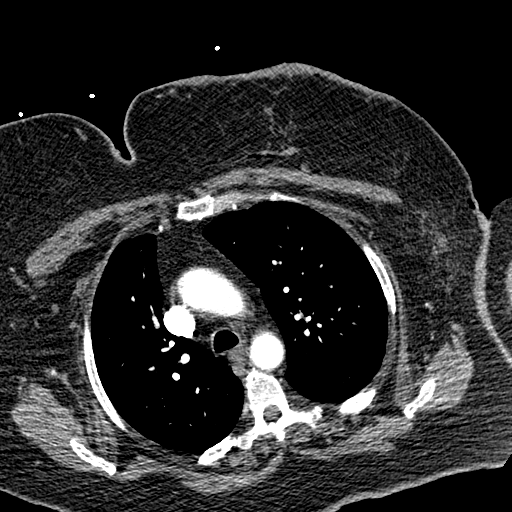
[im 247/318  lung]
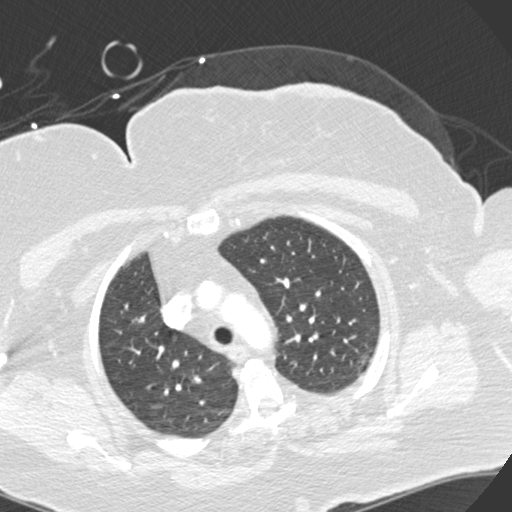
[im 265/318  mediastinal]
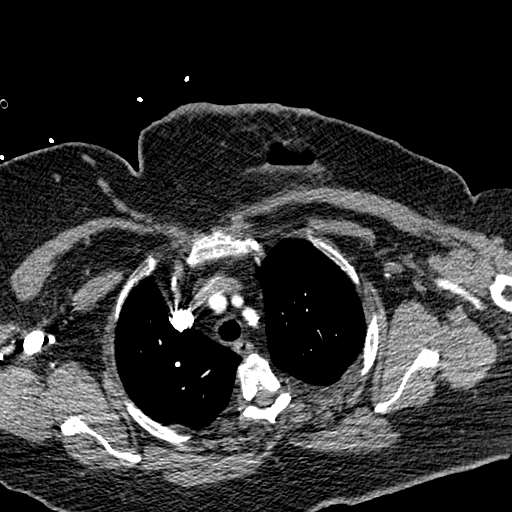
[im 282/318  lung]
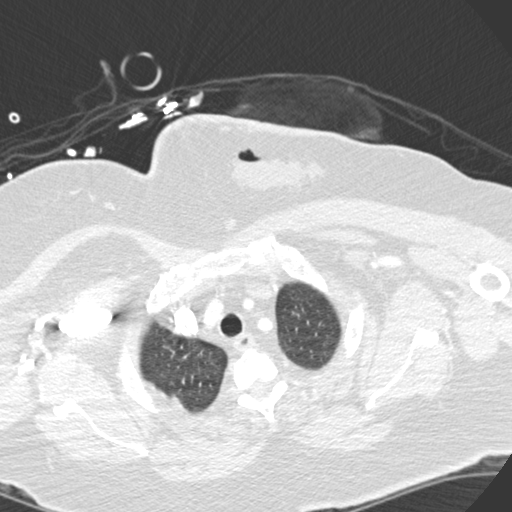
[im 300/318  mediastinal]
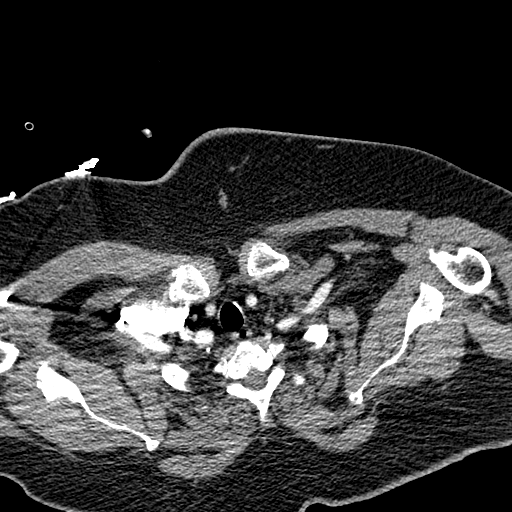

[18 of 30 positions shown; findings below may reference images not displayed]

FINDINGS: Lungs are well aerated bilaterally. No focal infiltrate or sizable
effusion is noted. Changes of prior granulomatous disease are again
noted. Previously seen nodular densities have improved in the
interval likely postinflammatory in nature. Small area pleural based
density is noted in the left lower lobe best seen on image number 89
of series 7. This measures approximately 10 mm in greatest
dimension. It is likely postinflammatory in nature as it was not
present on the prior exam. Followup examination is recommended.

The thoracic inlet is within normal limits. The thoracic aorta shows
no evidence of aneurysmal dilatation or dissection. The pulmonary
artery is well opacified without evidence of pulmonary embolism.

Postsurgical changes are noted in the left breast. The upper abdomen
is within normal limits with the exception of an enlarged left
adrenal gland stable from the prior study. The osseous structures
show no acute abnormality.

Review of the MIP images confirms the above findings.
IMPRESSION: Postoperative changes in the left breast.

No evidence of pulmonary emboli.

Prior granulomatous disease.

Pleural-based nodular appearing density in the left lower lobe as
described. Given some changes in the interval from the previous exam
this is likely postinflammatory in nature. If the patient is at high
risk for bronchogenic carcinoma, follow-up chest CT at 3-4months is
recommended. If the patient is at low risk for bronchogenic
carcinoma, follow-up chest CT at 6-12 months is recommended. This
recommendation follows the consensus statement: Guidelines for
Management of Small Pulmonary Nodules Detected on CT Scans: A
Statement from the [HOSPITAL] as published in Radiology

## 2017-09-13 IMAGING — CR DG CHEST 2V
1 series · 2 of 2 positions shown · non-contrast
Comparison: 10/09/2015

CLINICAL DATA: Chest pain, back pain, dizziness, headache and
shortness of breath.

EXAM:
CHEST - 2 VIEW

[Series 1: dg chest 2 view · 0.14mm/px · 2 of 2 slices shown]
[im 1/2]
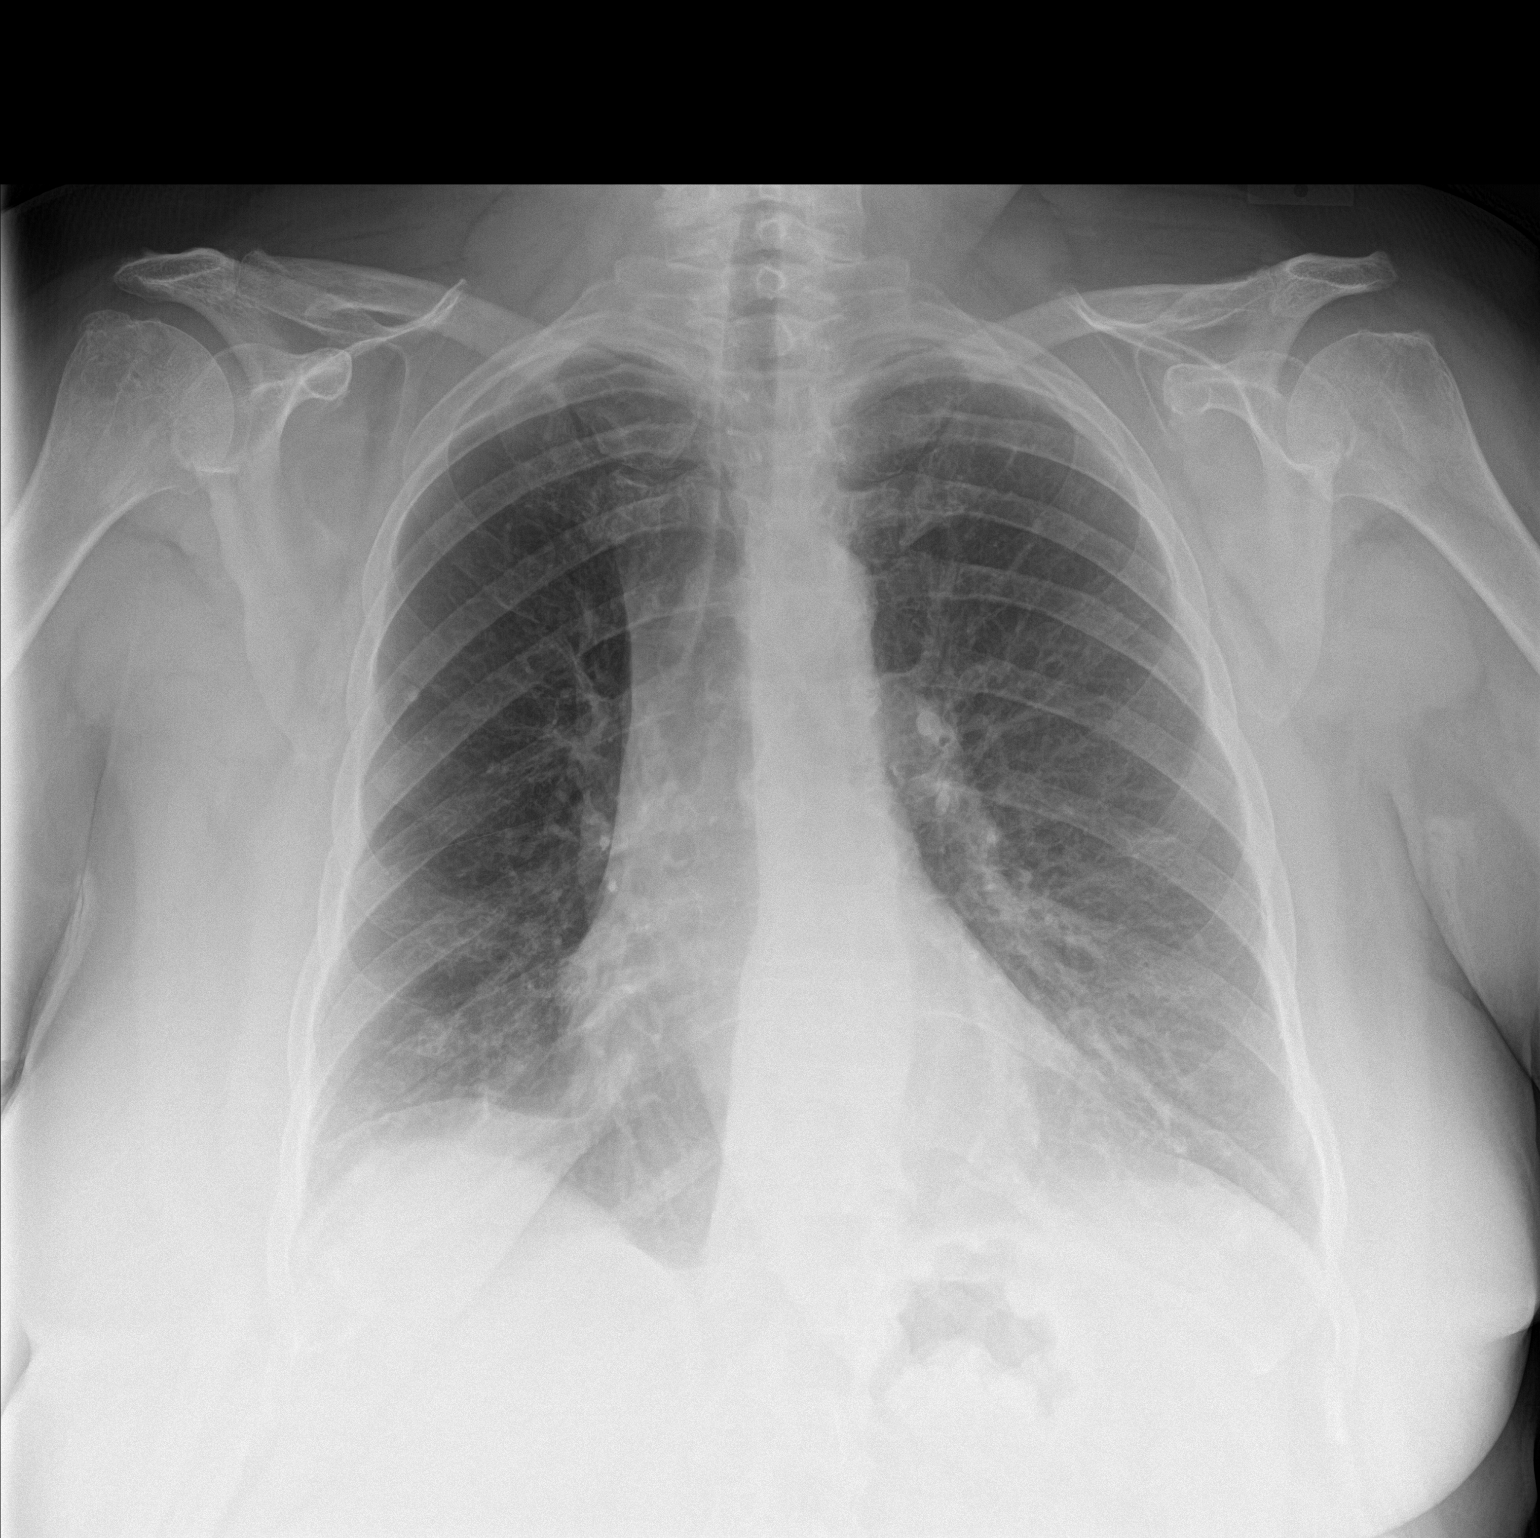
[im 2/2]
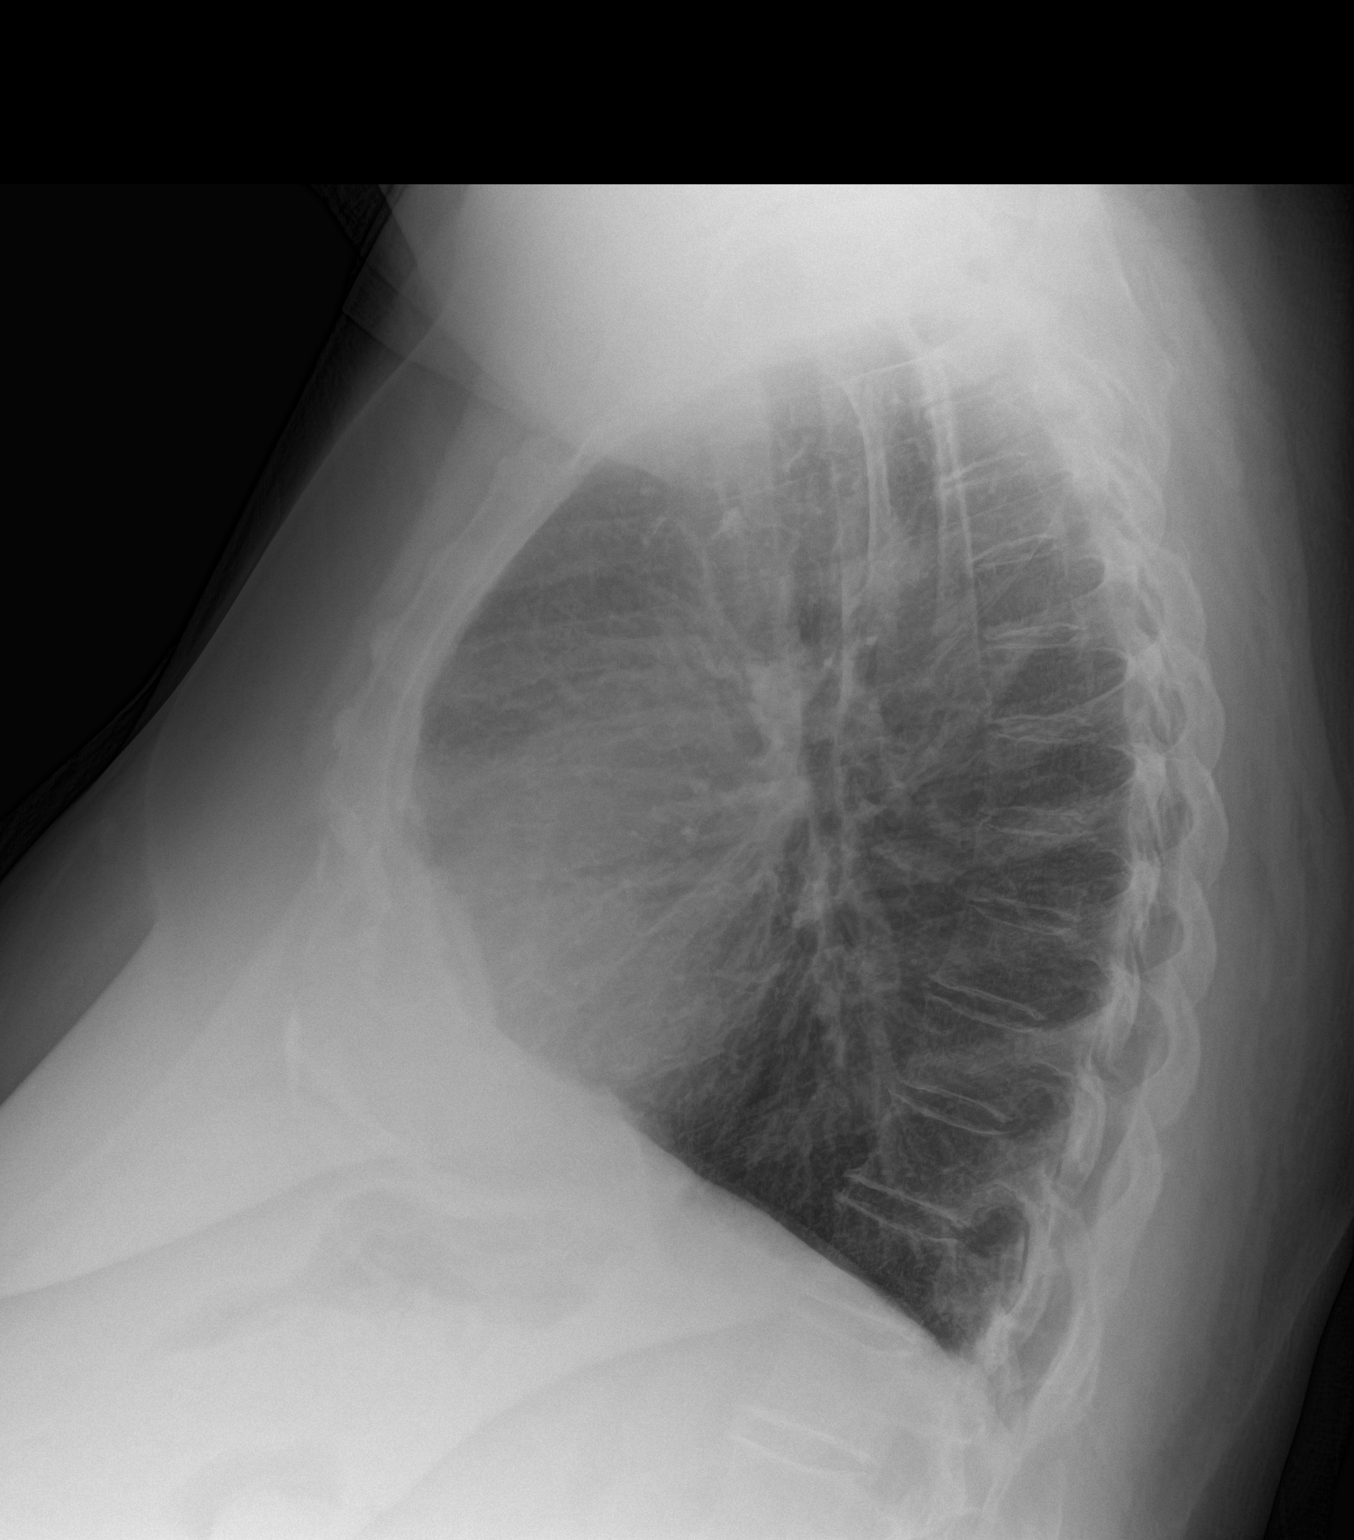

[2 of 2 positions shown; findings below may reference images not displayed]

FINDINGS: The heart size and mediastinal contours are within normal limits.
Bibasilar atelectasis/scarring present with probable component of
underlying chronic lung disease. There is no evidence of pulmonary
edema, focal consolidation, pneumothorax, nodule or pleural fluid.
The visualized skeletal structures are unremarkable.
IMPRESSION: Bibasilar scarring/atelectasis with probable underlying chronic lung
disease.
